# Patient Record
Sex: Female | Born: 1935 | Race: White | Hispanic: No | Marital: Married | Smoking: Former smoker
Health system: Southern US, Community
[De-identification: ages and names within clinical notes are randomized; demographics above are authoritative.]

## PROBLEM LIST (undated history)

## (undated) DIAGNOSIS — A0472 Enterocolitis due to Clostridium difficile, not specified as recurrent: Secondary | ICD-10-CM

## (undated) DIAGNOSIS — K219 Gastro-esophageal reflux disease without esophagitis: Secondary | ICD-10-CM

## (undated) DIAGNOSIS — I714 Abdominal aortic aneurysm, without rupture, unspecified: Secondary | ICD-10-CM

## (undated) DIAGNOSIS — I6522 Occlusion and stenosis of left carotid artery: Secondary | ICD-10-CM

## (undated) DIAGNOSIS — D649 Anemia, unspecified: Secondary | ICD-10-CM

## (undated) DIAGNOSIS — I739 Peripheral vascular disease, unspecified: Secondary | ICD-10-CM

## (undated) DIAGNOSIS — R011 Cardiac murmur, unspecified: Secondary | ICD-10-CM

## (undated) DIAGNOSIS — IMO0001 Reserved for inherently not codable concepts without codable children: Secondary | ICD-10-CM

## (undated) DIAGNOSIS — E230 Hypopituitarism: Secondary | ICD-10-CM

## (undated) DIAGNOSIS — E349 Endocrine disorder, unspecified: Secondary | ICD-10-CM

## (undated) DIAGNOSIS — Z5189 Encounter for other specified aftercare: Secondary | ICD-10-CM

## (undated) DIAGNOSIS — I639 Cerebral infarction, unspecified: Secondary | ICD-10-CM

## (undated) DIAGNOSIS — M1990 Unspecified osteoarthritis, unspecified site: Secondary | ICD-10-CM

## (undated) DIAGNOSIS — J189 Pneumonia, unspecified organism: Secondary | ICD-10-CM

## (undated) DIAGNOSIS — I10 Essential (primary) hypertension: Secondary | ICD-10-CM

## (undated) DIAGNOSIS — E039 Hypothyroidism, unspecified: Secondary | ICD-10-CM

## (undated) HISTORY — PX: BACK SURGERY: SHX140

## (undated) HISTORY — DX: Essential (primary) hypertension: I10

## (undated) HISTORY — PX: APPENDECTOMY: SHX54

## (undated) HISTORY — DX: Cerebral infarction, unspecified: I63.9

## (undated) HISTORY — PX: FRACTURE SURGERY: SHX138

## (undated) HISTORY — DX: Hypothyroidism, unspecified: E03.9

## (undated) HISTORY — PX: CARDIAC CATHETERIZATION: SHX172

## (undated) HISTORY — PX: JOINT REPLACEMENT: SHX530

---

## 1971-10-06 HISTORY — PX: ABDOMINAL HYSTERECTOMY: SHX81

## 1975-10-06 DIAGNOSIS — E230 Hypopituitarism: Secondary | ICD-10-CM

## 1975-10-06 HISTORY — DX: Hypopituitarism: E23.0

## 1999-09-22 ENCOUNTER — Ambulatory Visit: Admission: RE | Admit: 1999-09-22 | Discharge: 1999-09-22 | Payer: Self-pay | Admitting: Orthopedic Surgery

## 1999-11-19 ENCOUNTER — Encounter: Payer: Self-pay | Admitting: Obstetrics and Gynecology

## 1999-11-19 ENCOUNTER — Encounter: Admission: RE | Admit: 1999-11-19 | Discharge: 1999-11-19 | Payer: Self-pay | Admitting: Obstetrics and Gynecology

## 2000-02-03 HISTORY — PX: TOE FUSION: SHX1070

## 2000-02-19 ENCOUNTER — Ambulatory Visit (HOSPITAL_BASED_OUTPATIENT_CLINIC_OR_DEPARTMENT_OTHER): Admission: RE | Admit: 2000-02-19 | Discharge: 2000-02-19 | Payer: Self-pay | Admitting: Orthopedic Surgery

## 2001-01-04 ENCOUNTER — Encounter: Payer: Self-pay | Admitting: Obstetrics and Gynecology

## 2001-01-04 ENCOUNTER — Encounter: Admission: RE | Admit: 2001-01-04 | Discharge: 2001-01-04 | Payer: Self-pay | Admitting: Obstetrics and Gynecology

## 2001-01-12 ENCOUNTER — Ambulatory Visit (HOSPITAL_COMMUNITY): Admission: RE | Admit: 2001-01-12 | Discharge: 2001-01-12 | Payer: Self-pay | Admitting: Endocrinology

## 2005-07-20 ENCOUNTER — Encounter: Admission: RE | Admit: 2005-07-20 | Discharge: 2005-07-20 | Payer: Self-pay | Admitting: Endocrinology

## 2005-09-18 ENCOUNTER — Inpatient Hospital Stay (HOSPITAL_COMMUNITY): Admission: AD | Admit: 2005-09-18 | Discharge: 2005-09-21 | Payer: Self-pay | Admitting: Endocrinology

## 2005-11-12 ENCOUNTER — Ambulatory Visit (HOSPITAL_BASED_OUTPATIENT_CLINIC_OR_DEPARTMENT_OTHER): Admission: RE | Admit: 2005-11-12 | Discharge: 2005-11-12 | Payer: Self-pay | Admitting: Orthopedic Surgery

## 2005-11-12 HISTORY — PX: FOOT HARDWARE REMOVAL: SHX1661

## 2005-11-12 HISTORY — PX: MENISCUS REPAIR: SHX5179

## 2007-01-04 ENCOUNTER — Inpatient Hospital Stay (HOSPITAL_COMMUNITY): Admission: EM | Admit: 2007-01-04 | Discharge: 2007-01-08 | Payer: Self-pay | Admitting: Emergency Medicine

## 2007-01-04 HISTORY — PX: HEMIARTHROPLASTY HIP: SUR652

## 2007-07-27 ENCOUNTER — Inpatient Hospital Stay (HOSPITAL_COMMUNITY): Admission: EM | Admit: 2007-07-27 | Discharge: 2007-07-29 | Payer: Self-pay | Admitting: Emergency Medicine

## 2007-09-07 ENCOUNTER — Encounter: Admission: RE | Admit: 2007-09-07 | Discharge: 2007-10-12 | Payer: Self-pay | Admitting: Neurological Surgery

## 2008-02-03 HISTORY — PX: TOTAL KNEE ARTHROPLASTY: SHX125

## 2008-02-15 ENCOUNTER — Inpatient Hospital Stay (HOSPITAL_COMMUNITY): Admission: RE | Admit: 2008-02-15 | Discharge: 2008-02-21 | Payer: Self-pay | Admitting: Orthopedic Surgery

## 2008-03-29 ENCOUNTER — Encounter: Admission: RE | Admit: 2008-03-29 | Discharge: 2008-03-29 | Payer: Self-pay | Admitting: Neurological Surgery

## 2008-05-06 ENCOUNTER — Encounter: Admission: RE | Admit: 2008-05-06 | Discharge: 2008-05-06 | Payer: Self-pay | Admitting: Neurological Surgery

## 2008-07-08 ENCOUNTER — Encounter: Admission: RE | Admit: 2008-07-08 | Discharge: 2008-07-08 | Payer: Self-pay | Admitting: Endocrinology

## 2008-07-25 ENCOUNTER — Ambulatory Visit: Payer: Self-pay | Admitting: Vascular Surgery

## 2008-07-31 ENCOUNTER — Ambulatory Visit (HOSPITAL_COMMUNITY): Admission: RE | Admit: 2008-07-31 | Discharge: 2008-07-31 | Payer: Self-pay | Admitting: Endocrinology

## 2008-07-31 ENCOUNTER — Encounter (INDEPENDENT_AMBULATORY_CARE_PROVIDER_SITE_OTHER): Payer: Self-pay | Admitting: Endocrinology

## 2008-08-09 ENCOUNTER — Encounter: Admission: RE | Admit: 2008-08-09 | Discharge: 2008-08-09 | Payer: Self-pay | Admitting: Cardiology

## 2008-08-15 ENCOUNTER — Ambulatory Visit (HOSPITAL_COMMUNITY): Admission: RE | Admit: 2008-08-15 | Discharge: 2008-08-16 | Payer: Self-pay | Admitting: Cardiology

## 2008-08-22 ENCOUNTER — Encounter: Admission: RE | Admit: 2008-08-22 | Discharge: 2008-08-22 | Payer: Self-pay | Admitting: Radiology

## 2008-09-04 HISTORY — PX: CAROTID ENDARTERECTOMY: SUR193

## 2008-09-10 ENCOUNTER — Ambulatory Visit: Payer: Self-pay | Admitting: Vascular Surgery

## 2008-09-12 ENCOUNTER — Ambulatory Visit: Payer: Self-pay | Admitting: Vascular Surgery

## 2008-09-12 ENCOUNTER — Encounter: Payer: Self-pay | Admitting: Vascular Surgery

## 2008-09-12 ENCOUNTER — Inpatient Hospital Stay (HOSPITAL_COMMUNITY): Admission: RE | Admit: 2008-09-12 | Discharge: 2008-09-13 | Payer: Self-pay | Admitting: Vascular Surgery

## 2008-09-25 ENCOUNTER — Ambulatory Visit: Payer: Self-pay | Admitting: Vascular Surgery

## 2009-04-02 ENCOUNTER — Ambulatory Visit: Payer: Self-pay | Admitting: Vascular Surgery

## 2009-09-26 ENCOUNTER — Ambulatory Visit: Payer: Self-pay | Admitting: Vascular Surgery

## 2010-01-03 HISTORY — PX: ORIF ANKLE FRACTURE: SUR919

## 2010-01-20 ENCOUNTER — Inpatient Hospital Stay (HOSPITAL_COMMUNITY): Admission: AD | Admit: 2010-01-20 | Discharge: 2010-01-24 | Payer: Self-pay | Admitting: Orthopaedic Surgery

## 2010-05-14 ENCOUNTER — Encounter: Admission: RE | Admit: 2010-05-14 | Discharge: 2010-05-14 | Payer: Self-pay | Admitting: Endocrinology

## 2010-09-15 ENCOUNTER — Encounter
Admission: RE | Admit: 2010-09-15 | Discharge: 2010-09-15 | Payer: Self-pay | Source: Home / Self Care | Attending: Endocrinology | Admitting: Endocrinology

## 2010-09-17 ENCOUNTER — Ambulatory Visit: Payer: Self-pay | Admitting: Vascular Surgery

## 2010-11-04 ENCOUNTER — Encounter
Admission: RE | Admit: 2010-11-04 | Discharge: 2010-11-04 | Payer: Self-pay | Source: Home / Self Care | Attending: Endocrinology | Admitting: Endocrinology

## 2010-12-23 LAB — URINALYSIS, ROUTINE W REFLEX MICROSCOPIC
Hgb urine dipstick: NEGATIVE
Ketones, ur: NEGATIVE mg/dL
Nitrite: NEGATIVE
Urobilinogen, UA: 0.2 mg/dL (ref 0.0–1.0)
pH: 6.5 (ref 5.0–8.0)

## 2010-12-23 LAB — COMPREHENSIVE METABOLIC PANEL
ALT: 14 U/L (ref 0–35)
BUN: 24 mg/dL — ABNORMAL HIGH (ref 6–23)
Calcium: 8.9 mg/dL (ref 8.4–10.5)
GFR calc Af Amer: >60 mL/min (ref >60)
GFR calc non Af Amer: 54 mL/min — ABNORMAL LOW (ref >60)
Glucose, Bld: 115 mg/dL — ABNORMAL HIGH (ref 70–99)
Sodium: 133 mEq/L — ABNORMAL LOW (ref 135–145)
Total Bilirubin: 0.4 mg/dL (ref 0.3–1.2)

## 2010-12-23 LAB — CBC
HCT: 36.4 % (ref 36.0–46.0)
Hemoglobin: 12.8 g/dL (ref 12.0–15.0)
RDW: 15.2 % (ref 11.5–15.5)
WBC: 14.6 10*3/uL — ABNORMAL HIGH (ref 4.0–10.5)

## 2010-12-23 LAB — GLUCOSE, CAPILLARY
Glucose-Capillary: 119 mg/dL — ABNORMAL HIGH (ref 70–99)
Glucose-Capillary: 127 mg/dL — ABNORMAL HIGH (ref 70–99)
Glucose-Capillary: 128 mg/dL — ABNORMAL HIGH (ref 70–99)
Glucose-Capillary: 129 mg/dL — ABNORMAL HIGH (ref 70–99)
Glucose-Capillary: 139 mg/dL — ABNORMAL HIGH (ref 70–99)
Glucose-Capillary: 143 mg/dL — ABNORMAL HIGH (ref 70–99)
Glucose-Capillary: 155 mg/dL — ABNORMAL HIGH (ref 70–99)

## 2010-12-23 LAB — DIFFERENTIAL
Basophils Absolute: 0 10*3/uL (ref 0.0–0.1)
Eosinophils Absolute: 0 10*3/uL (ref 0.0–0.7)
Eosinophils Relative: 0 % (ref 0–5)
Lymphocytes Relative: 10 % — ABNORMAL LOW (ref 12–46)
Lymphs Abs: 1.4 10*3/uL (ref 0.7–4.0)
Neutrophils Relative %: 82 % — ABNORMAL HIGH (ref 43–77)

## 2011-02-17 NOTE — Cardiovascular Report (Signed)
NAME:  Katie Pace, Katie Pace              ACCOUNT NO.:  [id number]   MEDICAL RECORD NO.:  [id number]          PATIENT TYPE:  OIB   LOCATION:  4703                         FACILITY:  [facility]   PHYSICIAN:  Madaline Savage, M.D.DATE OF BIRTH:  November 19, 1935   DATE OF PROCEDURE:  08/15/2008  DATE OF DISCHARGE:                            CARDIAC CATHETERIZATION   PROCEDURES PERFORMED:  1. Combined right and left heart catheterization.  2. Left ventricular angiography.  3. Coronary angiography.  4. No coronary intervention done.   COMPLICATIONS:  None.   ENTRY SITE:  Right femoral artery and vein.   PATIENT PROFILE:  [name], who is also known as [name] is a former  Producer, television/film/video at [employer], who is a medical patient of  Dr. Corrin Parker and a cardiology patient of mine.  The patient's  family has aortic valve and vascular disease that is widespread and the  patient has a history of hypertension.   She entered the Cath Lab today for outpatient cardiac catheterization as  a bridge to presurgical clearance for an upcoming carotid artery  stenosis surgery for an 80% left carotid artery stenosis.  The case was  uncomplicated, although technically challenging.  No complications  occurred.  The patient tolerated the procedure very well.   Results are as follows:  Right atrial pressure 6/3, end-diastolic  pressure 3; right ventricular pressure 32/5, end-diastolic pressure 7;  pulmonary capillary wedge pressure of 12; A-wave 14; V-wave 10; central  aortic pressure 174/6; central aortic pressure 171/73; pulmonary artery  saturation 70%; central aorta saturation 93.  No pullback pressure  across the central aorta was reported.   ANGIOGRAPHIC RESULTS:  The patient has a markedly dominant left  circumflex coronary artery system, which gives rise to a very large  posterior descending branch and 2 posterolateral branches coming off of  a common trunk off of the midcircumflex.  No  lesions are seen in that  vessel.   Left main coronary artery is normal.   Left anterior descending coronary artery gives rise to 2 diagonal  branches and 1 major septal perforator branch.  LAD is normal.  Septal  perforator is normal.  The first diagonal branch contains a stenosis of  30% proximally.  The second diagonal branch, which bifurcates is normal  in appearance.   The right coronary artery is small and nondominant with thread-like  coronary arteries that are normal, and as previously stated, it is  nondominant.   Left ventricular angiogram shows vigorous contractility of all wall  segments.  EF is estimated at 55-60%, and there is no mitral  regurgitation.  Fick cardiac output was 6.3.  Fick cardiac index was  3.5.   FINAL IMPRESSIONS:  1. Angiographically, patent coronary arteries with only a 30% ostial      narrowing of the first diagonal branch of the left anterior      descending.  The remainder of the coronary arteries were normal.  2. Normal left ventricular systolic function, ejection fraction of 55-      60 up to 65%.  3. Normal right heart pressures.  4. Elevated blood pressure, treated with labetalol on one or two      occasions.   PLAN:  The patient will be recovered here in the Cath Lab and be a  candidate for discharge later today.  As previously stated, the purpose  of this case was to provide surgical clearance for her upcoming left  carotid artery surgery by one of the vascular surgeons at [facility].           ______________________________  Madaline Savage, M.D.     WHG/MEDQ  D:  08/15/2008  T:  08/15/2008  Job:  [id number]   cc:   [facility]

## 2011-02-17 NOTE — Op Note (Signed)
Katie Pace, Katie Pace              ACCOUNT NO.:  [id number]   MEDICAL RECORD NO.:  [id number]          PATIENT TYPE:  INP   LOCATION:  5032                         FACILITY:  [facility]   PHYSICIAN:  Nadara Mustard, MD     DATE OF BIRTH:  [birth year]   DATE OF PROCEDURE:  02/15/2008  DATE OF DISCHARGE:                               OPERATIVE REPORT   PREOP DIAGNOSIS:  Osteoarthritis, right knee.   POSTOP DIAGNOSIS:  Osteoarthritis, right knee.   PROCEDURE:  Right total knee arthroplasty with a #3 femur, #3 tibia, 10-  mm poly tray, posterior stabilized rotating platform with 35-mm patella.   SURGEON:  Nadara Mustard, MD   ASSISTANT:  Wende Neighbors, PA.   ANESTHESIA:  Femoral block plus spinal.   ESTIMATED BLOOD LOSS:  Minimal.   ANTIBIOTICS:  Clindamycin 600 mg.   IV DRAINS:  None.   COMPLICATIONS:  None.   TOURNIQUET TIME:  Approximately 16 minutes at 250 mmHg.   DISPOSITION:  To PACU in stable condition.   PROCEDURE:  The patient is a 75 year old woman with a chronic history of  osteoarthritis of her right knee.  She has pain with activities of daily  living, has failed prolonged conservative care, and presents at this  time for total knee arthroplasty.  Risks and benefits were discussed  including infection, neurovascular injury, persistent pain, DVT,  pulmonary embolus, and need for additional surgery.  The patient states  she understands and wishes to proceed at this time.   DESCRIPTION OF PROCEDURE:  The patient was brought to OR room one.  After undergoing a femoral block, she then underwent a spinal  anesthetic.  After adequate level of anesthesia was obtained, the  patient's right lower extremity was prepped using DuraPrep and draped in  a sterile field.  Katie Pace was used to cover all exposed skin.  A midline  incision was made.  This was carried down to the medial parapatellar  retinacular incision.  The patella was everted.  The canal drill was  used and the IM  guide for the femur was placed.  This was set to take 11-  mm off the femur and 11-mm was taken off the distal aspect of the femur.  The femur was sized for size 3 and the chamfer cuts were made for a size  3 femur.  Attention was then focused on the tibia.  The external  alignment guide was placed in the tibia with neutral varus-valgus and  neutral posterior slope, and this was set to take 10 mm off the tibia.  The cut was made with the external alignment guide.  The tibial surface  was sized for a size 3 and a keel punch in the trial keel was placed.  The box cut was then placed in the femur.  Box cuts were made for the  size 3 and the trial femoral component was placed and the peg holes were  drilled for the femur.  This was tried with a 10-mm poly tray.  The  patient had full extension, full flexion, and stable varus and valgus.  The trial components were removed.  The patella was then resurfaced 10-  mm was taken off the patella.  This was sized for 35 and the peg cuts  were made for the patella.  The knee was irrigated with pulsatile  lavage.  The tourniquet was inflated and 30 mL of 0.25% Marcaine plain  was injected into the popliteal fossa and care was taken not to have an  intra-articular injection.  The components were then cemented in place.  The tibial component was followed by the femoral component was cemented  in place.  The wound was irrigated with pulsatile lavage.  The tibial  tray was placed and the was kept in extension until the cement hardened.  The patella was placed and cemented and the clamp was left in place  until the cement hardened.  All loose cement was removed.  The clamp was  removed.  The knee was placed through a full range of motion.  The  patella was midline with no subluxation.  The retinacular incision was  closed using #1 Vicryl.  Subcu was closed using 2-0 Vicryl.  The skin  was closed using approximate staples.  The wound was covered with  Adaptic  orthopedic sponges, sterile Webril, and a Coban dressing.  The  patient was then taken to the PACU in stable condition.      Nadara Mustard, MD  Electronically Signed     MVD/MEDQ  D:  02/15/2008  T:  02/16/2008  Job:  [id number]

## 2011-02-17 NOTE — Procedures (Signed)
CAROTID DUPLEX EXAM   INDICATION:  Follow up left carotid endarterectomy.   HISTORY:  Diabetes:  Yes.  Cardiac:  No.  Hypertension:  Yes.  Smoking:  Quit about 50 years ago.  Previous Surgery:  CV History:  Amaurosis Fugax No, Paresthesias No, Hemiparesis No.                                       RIGHT             LEFT  Brachial systolic pressure:         170               164  Brachial Doppler waveforms:         Biphasic          Biphasic  Vertebral direction of flow:        Antegrade         Antegrade  DUPLEX VELOCITIES (cm/sec)  CCA peak systolic                   85                92  ECA peak systolic                   90                87  ICA peak systolic                   151               57  ICA end diastolic                   38                15  PLAQUE MORPHOLOGY:                  Calcified         None  PLAQUE AMOUNT:                      Moderate          None  PLAQUE LOCATION:                    ICA, ECA          None   IMPRESSION:  1. 40-59% stenosis noted in the right internal carotid artery.  2. Normal carotid duplex noted in the left internal carotid artery,      status post left carotid endarterectomy.  3. Antegrade bilateral vertebral arteries.   ___________________________________________  Quita Skye Hart [place], M.D.   MG/MEDQ  D:  04/02/2009  T:  04/02/2009  Job:  [id number]

## 2011-02-17 NOTE — Consult Note (Signed)
Katie Pace, Katie Pace              ACCOUNT NO.:  [id number]   MEDICAL RECORD NO.:  [id number]          PATIENT TYPE:  INP   LOCATION:  5032                         FACILITY:  [facility]   PHYSICIAN:  Isidor Holts, M.D.  DATE OF BIRTH:  04-26-1936   DATE OF CONSULTATION:  02/19/2008  DATE OF DISCHARGE:                                 CONSULTATION   PRIMARY MEDICAL DOCTOR:  Dr. Aldean Baker.   REQUESTING PHYSICIAN:  Dr. Dorene Grebe, orthopedic surgeon.   REASON FOR CONSULTATION:  Hyponatremia.   HISTORY OF PRESENT ILLNESS:  This is a 75 year old female admitted Feb 15, 2008, for end-stage osteoarthritis right knee and is now status post  right total knee arthroplasty on the same date, i.e. today is  postoperative day #4.  We are requested to consult for progressive  hyponatremia.   PAST MEDICAL HISTORY:  1. Sheehan's syndrome.  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  3. Hashimoto's thyroiditis.  4. Hypertension.  5. Osteoarthritis status post right hip hemiarthroplasty for right hip      fracture January 05, 2007.  6. GERD.  7. Dyslipidemia.  8. Status post multiple orthopedic procedures involving the left foot      and right knee.   MEDICATION HISTORY PREADMISSION:  1. Triamterene/hydrochlorothiazide (37.5/25) one p.o. daily.  2. Cortef 15 mg p.o. q.a.m. and 10 mg p.o. q.h.s.  3. Diltiazem CD 240 mg p.o. daily.  4. Synthroid 75 mcg p.o. daily.  5. Crestor 5 mg p.o. daily.  6. Nexium 40 mg p.o. daily.  7. Humatrope 0.5 mg injection subcutaneously q.h.s.  8. Vitamin B12 injection 1 mg IM monthly on the first day of each      month.   CURRENT MEDICATIONS:  As above, plus:  1. Tenormin 50 mg p.o. daily.  2. Ferrous sulfate 325 mg p.o. t.i.d.  3. Colace 100 mg p.o. b.i.d.  4. Os-Cal with vitamin D.  5. Coumadin per protocol.   ALLERGIES:  1. PENICILLIN.  2. LINCOCIN.  3. MACRODANTIN.  4. DILAUDID and CODEINE cause nausea.   REVIEW OF SYSTEMS:  The patient has nausea but  no vomiting.  Appetite is  low.  Denies fever.  Denies chest pain or shortness of breath.  Denies  diarrhea.   PHYSICAL EXAMINATION:  A very cheerful, pleasant and rather garrulous  lady; alert, communicative, not short of breath at rest.  VITAL SIGNS:  Temperature 97.8, pulse 58 per minute and regular,  respiratory rate 20, BP 137/55 mmHg, pulse oximeter 95% on room air.  HEENT:  No clinical pallor or jaundice.  No conjunctival injection.  Throat is clear.  Hydration status appears okay.  NECK:  Supple.  JVP not seen.  No palpable lymphadenopathy.  No palpable  goiter.  CHEST:  Clinically clear to auscultation.  No wheezes or crackles.  HEART SOUNDS:  1 and 2 heard, normal, regular, no murmurs.  ABDOMEN:  Full, soft and nontender.  No palpable organomegaly or  palpable masses.  Normal bowel sounds.  LOWER EXTREMITY EXAMINATION:  The patient has dressings over the right  knee and bilateral above-knee TED stockings.  No pitting edema.  MUSCULOSKELETAL SYSTEM:  Not formally examined.  CENTRAL NERVOUS SYSTEM:  No focal neurologic deficit on gross  examination.   INVESTIGATIONS:  Feb 18, 2008:  WBC 11.4, hemoglobin 9.1, hematocrit  26.5, platelets 214, MCV 83.4.  Electrolytes:  Sodium 125, potassium  3.8, chloride 90, CO2 28, BUN 9, creatinine 0.75, glucose 92.  Feb 19, 2008, electrolytes:  Sodium 123, potassium 3.9, chloride 88, CO2 28, BUN  10, creatinine 0.80, glucose 101.  INR is 1.6.   ASSESSMENT AND PLAN/RECOMMENDATIONS:  1. Hyponatremia.  The culprit is likely the patient's known history of      hypopituitarism, against a background of intravenous fluids, i.e.      dilutional hyponatremia.  Would recommend fluid restriction in the      range of 1000-1200 ml per day.  I notice this has already been      instituted by orthopedic surgeon.  Follow sodium levels.  We      anticipate improvement.   1. Chronic steroid treatment.  The patient of course, requires stress      steroids  to cover the stress of procedure, although she is not in      overt adrenal crisis at present.  Would recommend changing      Hydrocortisone to 50 mg IV q.12h. until clinically stable, after      which she may taper down to maintenance doses in due course.  All      other replacement therapy for her hypopituitarism continues.   1. Normocytic anemia.  Hemoglobin appears reasonable at present.   1. Dyslipidemia.  The patient is on a statin, which should be      continued.   1. Gastroesophageal reflux disease.  The patient is on proton pump      inhibitor.   COMMENTS:  We strongly recommend involving Dr. Dagoberto Ligas, et al., ie, her  primary endocrinologist, in her care.  She has had adrenal crisis in the  past.   Thank you for the consultation.  We will follow with you.      Isidor Holts, M.D.  Electronically Signed     CO/MEDQ  D:  02/19/2008  T:  02/19/2008  Job:  [id number]   cc:   Nadara Mustard, MD  G. Dorene Grebe, M.D.

## 2011-02-17 NOTE — H&P (Signed)
NAME:  Katie Pace, Katie Pace              ACCOUNT NO.:  [id number]   MEDICAL RECORD NO.:  [id number]          PATIENT TYPE:  INP   LOCATION:  6729                         FACILITY:  [facility]   PHYSICIAN:  Stefani Dama, M.D.  DATE OF BIRTH:  1936-01-15   DATE OF ADMISSION:  07/27/2007  DATE OF DISCHARGE:                              HISTORY & PHYSICAL   ADMITTING DIAGNOSES:  1. Acute lumbar radiculopathy.  2. Herniated nucleus pulposus.   HISTORY OF PRESENT ILLNESS:  Jerusalen Mateja is a 75 year old right-  handed individual who developed a sudden onset of buttock and left lower  extremity pain radiating down the thigh.  She notes that the worst pain  radiates throughout her entire left lower extremity although she admits  now that her pain is more severe in the anterior thigh.  She has had a  previous history of a herniated nucleus pulposus involving pain in the  right lower extremity.  She has had previous surgery for this process.  She notes the pain is similar but, in many senses, more severe.   PAST MEDICAL HISTORY:  Reveals that the patient has  1. 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  2. She had Hashimoto's thyroiditis.  3. Hypertension.   MEDICATION AT THE CURRENT TIME:  1. Cortef 15 mg in the morning and 10 mg in the evening.  2. Diltiazem CD 240 mg daily.  3. Atenolol 50 mg daily.  4. Triamterene/hydrochlorothiazide 37.5/25 mg daily.  5. Synthroid 0.77 mg daily.  6. Crestor 5 mg daily.  7. Humatrope 0.5 mg subcu nightly.  8. B12 1 mL monthly.   ALLERGIES:  She has an allergy to PENICILLIN, LINCOMYCIN, MACRODANTIN,  DILAUDID and CODEINE.   PRIMARY CARE PHYSICIAN:  Alfonse Alpers. Dagoberto Ligas, M.D.   Physical examination at the current time reveals that she is in severe  distress.  She rolls from side-to-side very cautiously.  She will scream  out in pain with flexion maneuvers of the left upper extremity.  Her  motor strength reveals the iliopsoas is  4/5, tibialis anterior is 4/5  and gastrocs is 5/5, iliopsoas on the right side along with the tibialis  anterior and gastrocs is all 5/5.  Sensation appears intact in the lower  extremities at the levels of the knee and the ankles both to vibration.  Straight leg raising is markedly positive on left side at 15 degrees,  negative on the right side to 45 degrees of which point she gets right  leg pain.  Patrick's maneuver is difficult to perform on the left  secondary to pain, negative on the right side.  Palpation and percussion  of her back reveals some moderate paravertebral spasm in the mid and  upper portions of the lumbar spine.  Her motor function in the upper  extremities is within limits of normal.   IMPRESSION:  The patient has evidence of left lumbar radiculopathy  involving proximal musculature and proximal irritability.  She is now  being admitted to undergo pain control with IV analgesia and PCA  control.  She will undergo an  MRI of the lumbar spine.  Based on the  findings on the MRI, further intervention, be it surgical or  conservative, will be planned.      Stefani Dama, M.D.  Electronically Signed     HJE/MEDQ  D:  07/27/2007  T:  07/28/2007  Job:  [id number]   cc:   Alfonse Alpers. Dagoberto Ligas, M.D.

## 2011-02-17 NOTE — Assessment & Plan Note (Signed)
OFFICE VISIT   Pace, [name]  DOB:  05/24/1936                                       09/25/2008  ZOXWR#:[id number]   The patient is 2 weeks status post left carotid endarterectomy for  severe left internal carotid stenosis following a small left brain  stroke, which affected her visual field on the right side.  She has done  very well since her surgery with no new neurologic changes.  She is  swallowing well, has no hoarseness, and is increasing her activity as  tolerated.  She has noted some numbness anterior to the left neck  incision, as we would expect.  She is taking aspirin (two 81 mg tablets)  plus Plavix daily and has had no new neurologic symptoms.   EXAM:  Blood pressure 140/76, heart rate is 82, respirations 14.  Left  neck incision is well healed.  Carotid pulses are 3+ with a soft bruit  on the right and no bruit on the left.  Neurologic exam is grossly  normal.   She was reassured regarding these findings and will return to see me in  6 months with a followup carotid duplex exam.  I will leave the decision  up to Dr. Dagoberto Ligas regarding whether to continue the Plavix, but I think  she should definitely remain on the aspirin as I instructed her today.   Quita Skye Hart [place], M.D.  Electronically Signed   JDL/MEDQ  D:  09/25/2008  T:  09/26/2008  Job:  [id number]   cc:   Alfonse Alpers. Dagoberto Ligas, M.D.  Richarda Overlie, M.D.

## 2011-02-17 NOTE — Procedures (Signed)
CAROTID DUPLEX EXAM   INDICATION:  Follow up carotid artery disease.   HISTORY:  Diabetes:  Yes.  Cardiac:  No.  Hypertension:  Yes.  Smoking:  No.  Previous Surgery:  Left carotid endarterectomy, December, 2009.  CV History:  History of dizziness and vertigo, currently asymptomatic.  Amaurosis Fugax No, Paresthesias No, Hemiparesis No.                                       RIGHT             LEFT  Brachial systolic pressure:         187               180  Brachial Doppler waveforms:         Normal            Normal  Vertebral direction of flow:        Antegrade         Antegrade  DUPLEX VELOCITIES (cm/sec)  CCA peak systolic                   90                56  ECA peak systolic                   83                54  ICA peak systolic                   147               52  ICA end diastolic                   33                11  PLAQUE MORPHOLOGY:                  Mixed             Mixed  PLAQUE AMOUNT:                      Moderate          Mild  PLAQUE LOCATION:                    ICA               CCA, bifurcation   IMPRESSION:  1. 40% to 59% stenosis of the right internal carotid artery.  2. No evidence of restenosis, status post carotid endarterectomy of      the left internal carotid artery.    ___________________________________________  Quita Skye Hart [place], M.D.   EM/MEDQ  D:  09/17/2010  T:  09/17/2010  Job:  [id number]

## 2011-02-17 NOTE — Procedures (Signed)
CAROTID DUPLEX EXAM   INDICATION:  Followup of carotid disease.   HISTORY:  Diabetes:  Yes.  Cardiac:  No.  Hypertension:  Yes.  Smoking:  Previous.  Previous Surgery:  Left carotid endarterectomy in December of 2009.  CV History:  Dizziness, vertigo, hx of amaurosis fugax, but not  currently.  Family history:  Amaurosis Fugax No, Paresthesias No, Hemiparesis No                                       RIGHT             LEFT  Brachial systolic pressure:         200               198  Brachial Doppler waveforms:         Triphasic         Triphasic  Vertebral direction of flow:        Antegrade         Antegrade  DUPLEX VELOCITIES (cm/sec)  CCA peak systolic                   73                74  ECA peak systolic                   91                89  ICA peak systolic                   120               68  ICA end diastolic                   32                32  PLAQUE MORPHOLOGY:                  Mixed             Mixed  PLAQUE AMOUNT:                      Moderate          Mild  PLAQUE LOCATION:                    ICA               CCA and  bifurcation   IMPRESSION:  1. 40%-59% stenosis of the right internal carotid artery.  2. No evidence of restenosis of the left internal carotid artery.  3. Vessels are also tortuous.       ___________________________________________  Quita Skye Hart [place], M.D.   CJ/MEDQ  D:  09/26/2009  T:  09/26/2009  Job:  [id number]

## 2011-02-17 NOTE — Discharge Summary (Signed)
Katie Pace, BUSSER              ACCOUNT NO.:  [id number]   MEDICAL RECORD NO.:  [id number]          PATIENT TYPE:  INP   LOCATION:  5032                         FACILITY:  [facility]   PHYSICIAN:  Nadara Mustard, MD     DATE OF BIRTH:  [birth year]   DATE OF ADMISSION:  02/15/2008  DATE OF DISCHARGE:  02/21/2008                               DISCHARGE SUMMARY   FINAL DIAGNOSIS:  Osteoarthritis of the right knee.   PROCEDURES:  Right total knee arthroplasty with a few components, size 3  femur and tibia, 10-mm poly insert with a 35-mm patella.  [name],  [facility] assisted with surgery.   DISPOSITION:  The patient was discharged to home in stable condition  with [facility] for [facility] health physical therapy.   Prescriptions for Coumadin and Darvocet.   FOLLOWUP:  Follow up in the office in 2 weeks.   HISTORY OF PRESENT ILLNESS:  The patient is a 75 year old woman with  osteoarthritis of her right knee.  She has failed conservative care, has  pain with activities of daily living and presents at this time for total  knee arthroplasty.  The patient's hospital course was essentially  unremarkable.  She underwent a right total knee arthroplasty on Feb 15, 2008.  She received clindamycin for infection prophylaxis and was  started on Coumadin for DVT prophylaxis.  The patient progressed slowly  with her physical therapy and occupational therapy.  Hemoglobin on  postoperative day #1 was 9.1, it dropped to 8.4 on postoperative day #2.  She was on iron sulfate.  Her IV and PCA were discontinued on  postoperative day #2.  The patient was consulted with [facility]  for home health physical therapy.  The patient fell on the morning of  Feb 20, 2008; however, she was moving all extremities, had no injuries.  Her sodium was improving under medical consultation.  The patient was  discharged to home in stable condition on Feb 21, 2008, with followup in  the office in 2 weeks with  [facility] for home health physical  therapy.      Nadara Mustard, MD  Electronically Signed     MVD/MEDQ  D:  03/22/2008  T:  03/22/2008  Job:  [id number]

## 2011-02-17 NOTE — H&P (Signed)
HISTORY AND PHYSICAL EXAMINATION   September 10, 2008   Re:  Katie Pace              DOB:  [birth year]   CHIEF COMPLAINT:  Severe left carotid occlusive disease with previous  left brain stroke.   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  This was  discovered by Dr. Dagoberto Ligas when she performed an eye exam on the  patient and informed the patient that she had had a stroke in the past.  She does have a visual field deficit in the right lower quadrant of both  visual fields, suggesting previous left brain stroke.  MRA and MRI were  performed and revealed small vessel disease, but no definite stroke, but  did reveal an 80-90% left internal carotid stenosis and 60-70% right  internal carotid stenosis.  The patient denies any hemiparesis, aphagia,  amaurosis fugax, diplopia, blurred vision, or syncope.   PAST MEDICAL HISTORY:  1. Hypertension.  2. Hyperlipidemia.  3. History of Sheehan's syndrome (pituitary insufficiency).  4. Degenerative joint disease.  5. Negative for COPD or coronary artery disease.  6. Previous stroke.   PREVIOUS SURGERY:  1. Right total knee replacement.  2. Lumbar laminectomy.  3. Right partial hip replacement.   FAMILY HISTORY:  History of coronary artery disease in her mother,  brother, and sister.  Possible diabetes in mother.  Stroke in  grandmother.   SOCIAL HISTORY:  She is married, has 3 children, and is retired Charity fundraiser.  She  has not used cigarettes since 1964.  Has not used alcohol.   REVIEW OF SYSTEMS:  GASTROINTESTINAL:  Includes reflux esophagitis.  EXTREMITIES:  Nonhealing ulcer in the right first toe.  History of  arthritis, joint pain, degenerative disk disease.  Chronic back  discomfort.   ALLERGIES:  Includes penicillin, Lincocin, Macrodantin, azithromycin,  Ambien, Dilaudid, and codeine.   MEDICATIONS:  1. Cortef 15 mg  a.m. and 10 mg in the p.m.  2. Cardizem 240 mg CD 1 cap in the morning.  3. Atenolol 50 mg 1 daily.  4. Crestor 5 mg 1 daily in the evening.  5. Potassium 10 mEq 1 daily.  6. Plavix 75 mg 1 daily.  7. Amitriptyline 25 mg 1 daily at bedtime.  8. Aspirin 81 mg daily.  9. Nexium 40 mg cap 1 daily.  10.Humatrope 0.5 mg daily subcutaneous.  11.Synthroid 0.075 mg daily.  12.Calcium.  13.Iron.  14.Vitamin D.   PHYSICAL EXAM:  Blood pressure 152/75, heart rate 78, respirations 12.  Generally, she is a healthy-appearing female in no apparent distress.  Alert and oriented x3.  Neck is supple.  3+ carotid pulses palpable.  There is an audible bruit on the left.  Neurologic exam is grossly  normal.  No palpable adenopathy in the neck.  Upper extremity pulses 3+  bilaterally.  Chest clear to auscultation.  Cardiovascular exam reveals  a regular rate and rhythm with no murmurs.  Abdomen is soft and  nontender with no masses.  She has 3+ femoral and popliteal pulses  bilaterally with well-perfused lower extremities.   The patient did have cardiac catheterization performed on November 11 by  Dr. Elsie Lincoln with no significant coronary artery disease noted and  estimated ejection fraction 55-60% and was cleared for carotid surgery.   IMPRESSION:  Severe left internal carotid stenosis with previous left  brain stroke.   PLAN:  To admit the patient on December 9 for an elective left carotid  endarterectomy.  The risks and benefits have been thoroughly discussed  with the patient and her husband, and she would like to proceed.   Katie Pace, M.D.  Electronically Signed   JDL/MEDQ  D:  09/10/2008  Pace:  09/11/2008  Job:  [id number]   cc:   Alfonse Alpers. Dagoberto Ligas, M.D.  Madaline Savage, M.D.

## 2011-02-17 NOTE — Op Note (Signed)
NAME:  Katie Pace, Katie Pace              ACCOUNT NO.:  [id number]   MEDICAL RECORD NO.:  [id number]          PATIENT TYPE:  INP   LOCATION:  3316                         FACILITY:  [facility]   PHYSICIAN:  Quita Skye. Hart Rochester, M.D.  DATE OF BIRTH:  22-Dec-1935   DATE OF PROCEDURE:  09/12/2008  DATE OF DISCHARGE:                               OPERATIVE REPORT   POSTOPERATIVE DIAGNOSIS:  Severe left internal carotid stenosis, status  post likely left brain cerebrovascular accident.   POSTOPERATIVE DIAGNOSIS:  Severe left internal carotid stenosis, status  post likely left brain cerebrovascular accident.   OPERATION:  Left carotid endarterectomy with Dacron patch angioplasty.   SURGEON:  Quita Skye. Hart Rochester, MD   FIRST ASSISTANT:  Jerold Coombe, PA   ANESTHESIA:  General endotracheal.   BRIEF HISTORY:  This patient suffered a stroke a few weeks ago which  resulted in some visual loss in the right lower quadrant of both visual  fields.  It was felt that she had probably suffered a small left brain  stroke, although this was not confirmed by MRI.  Carotid duplex revealed  severe left carotid occlusive disease and she is scheduled for left  carotid endarterectomy.   PROCEDURE:  The patient was taken to the operating room and placed in  the supine position at which time satisfactory general endotracheal  anesthesia was administered.  The left neck was prepped with Betadine  scrub and solution and draped in the routine sterile manner.  An  incision was made along the anterior border of the sternocleidomastoid  muscle and carried down through the subcutaneous tissue and platysma  using Bovie.  Common facial vein and external jugular veins were ligated  with 3-0 silk ties and divided exposing the common internal and external  carotid arteries.  Care was taken not to injure the vagus or hypoglossal  nerves, both of which were exposed.  There was a calcified  atherosclerotic plaque at the carotid  bifurcation extending up about 3  cm.  At that point, there was an adherent ascending pharyngeal branch  arising from the lateral aspect of the internal carotid artery.  This  was ligated with 3-0 silk ties and divided.  Exposure was necessitated  mobilizing the hypoglossal nerve because of the high nature of the  bifurcation.  A #10 shunt was prepared and the patient was heparinized.  Carotid vessels were occluded with vascular clamps.  A longitudinal  opening was made in the common carotid with 15 blade and extended up to  the internal carotid with Potts scissors to a point distal to the  disease.  Plaque was very soft in nature and easily friable, but the  distal vessel appeared normal and there was good backbleeding.  A #10  shunt was inserted without difficulty reestablishing flow in about 2  minutes.  A standard endarterectomy was then performed using the  elevator and Potts scissors with an eversion endarterectomy of the  external carotid.  The plaque was feathered off the distal internal  carotid artery nicely not requiring any tacking sutures.  Lumen was  thoroughly irrigated with heparin  saline.  All loose debris was  carefully removed and arteriotomy was closed with a patch using  continuous 6-0 Prolene.  Prior to completion of the closure, the shunt  was removed after about 30 minutes of shunt time.  Following antegrade  and retrograde flushing, closure was completed reestablishing flow  initially the external and up the internal branch.  Carotid was occluded  for less than 2 minutes for removal of shunt.  Protamine was then given  to  reverse the heparin.  Following adequate hemostasis, wound was irrigated  with saline and closed in layers with Vicryl subcuticular fashion.  Sterile dressing was applied.  The patient was taken to the recovery  room in satisfactory condition.      Quita Skye Hart Rochester, M.D.  Electronically Signed     JDL/MEDQ  D:  09/12/2008  T:  09/12/2008   Job:  [id number]   cc:   Madaline Savage, M.D.

## 2011-02-17 NOTE — Discharge Summary (Signed)
NAME:  Katie Pace, Katie Pace              ACCOUNT NO.:  [id number]   MEDICAL RECORD NO.:  [id number]          PATIENT TYPE:  INP   LOCATION:  3316                         FACILITY:  [facility]   PHYSICIAN:  Quita Skye. Hart Rochester, M.D.  DATE OF BIRTH:  Aug 07, 1936   DATE OF ADMISSION:  09/12/2008  DATE OF DISCHARGE:  09/13/2008                               DISCHARGE SUMMARY   ADMISSION DIAGNOSIS:  Severe left internal carotid artery stenosis  status post likely left branch cerebrovascular accident.   DISCHARGE DIAGNOSES:  1. Severe left internal carotid artery stenosis status post likely      left branch cerebrovascular accident, now status post left carotid      endarterectomy.  2. Hypertension.  3. Hyperlipidemia.  4. History of Sheehan syndrome (pituitary insufficiency).  5. Degenerative joint disease.  6. History of right total knee replacement.  7. Lumbar laminectomy.  8. History of right partial hip replacement.  9. Remote history of tobacco use.  10.Postoperative hypokalemia, supplemented.   ALLERGIES:  She has multiple allergies including PENICILLIN AND  DILAUDID, which cause her rash.  INDOCIN, HYDROCODONE, MACRODANTIN cause  joint problems.  She has an allergy to [facility].  She fell after taking  AMBIEN and allergy to ERYTHROMYCIN and CODEINE and sensitive to ADHESIVE  TAPE.   PROCEDURE:  On September 12, 2008, left carotid endarterectomy with Dacron  patch angioplasty by Dr. Josephina Gip.   BRIEF HISTORY:  Katie Pace is a 75 year old Caucasian female who is  believed suffered a stroke few weeks ago, which resultant in some visual  loss in her right lower quadrant with visual fields.  It was felt she  had probably suffered a small left brain stroke although this was not  confirmed by MRI.  Carotid duplex revealed severe left carotid occlusive  disease and she is referred to Dr. Josephina Gip who recommended left  carotid endarterectomy.   HOSPITAL COURSE:  Katie Pace was electively  admitted to [facility] on September 12, 2008.  She underwent the previously mentioned  procedure.  Postoperatively, she was extubated, neurologically intact  and after a short stay in the recovery unit, was transferred to step-  down unit at 1300 hours where she remained until discharge.  On the  evening of surgery, she did have some nausea and vomiting x1 which  improved overtime, however.  She also had some hypertension.  The  arterial line readings required some p.r.n. antihypertensive medication.  Her home antihypertensives were resumed on postoperative day 1.  She  also had a mild headache postoperatively which felt could be related to  perfusion versus her elevated blood pressure; however, this had also  improved by discharge.  On postoperative day 1, she was possibly able to  tolerate regular diet and ambulate without difficulty.  She denied  dysphagia and the incision was clean and intact and showed no evidence  of hematoma.  Ultimately, she was felt appropriate for discharge home.  On postoperative day 1, vitals were stable.  Discharge postoperative lab  showed white count 10.2, hemoglobin 10.1, hematocrit 29.8, platelet  count 203.  Sodium  of 137, potassium 3.2, which was supplemented, BUN of  13, creatinine 0.76, blood glucose 140.  Katie Pace was discharged home  postoperative day 1 in stable condition.   DISCHARGE MEDICATIONS:  1. Potassium citrate ER 10 mEq daily.  2. Nexium 40 mg daily.  3. Crestor 5 mg daily.  4. Cortef 10 mg 1-1/2 tablet b.i.d.  5. Diltiazem HCl CD 240 mg daily.  6. Amitriptyline HCl 25 mg daily.  7. Darvocet 100/65 mg 1-2 tablets p.o. q.6 h. p.r.n. for pain.  8. Atenolol 50 mg p.o. daily.  9. Plavix 75 mg daily.  10.Aspirin 81 mg daily.  11.Vitamin B12 1000 mcg injection monthly.  12.Humatrope 0.5 mg at bedtime.  13.Synthroid 0.075 mg daily.  14.Calcium 600 mg daily.  15.Vitamin D 2000 mg daily.  16.Iron supplement daily.   DISCHARGE  INSTRUCTIONS:  Continue a heart-healthy diet clean and  incisions gently with soap and water.  Avoid driving or heavy lifting  for the 2 weeks.  She is to see Dr. Hart Rochester in 2-3 weeks.  She is to call  sooner if she has fever greater than 100, redness, drainage from the  incision site, or any neurologic changes.      Jerold Coombe, P.A.      Quita Skye Hart Rochester, M.D.  Electronically Signed    AWZ/MEDQ  D:  09/13/2008  T:  09/14/2008  Job:  [id number]   cc:   Quita Skye. Hart Rochester, M.D.  Alfonse Alpers. Dagoberto Ligas, M.D.  Madaline Savage, M.D.

## 2011-02-17 NOTE — Assessment & Plan Note (Signed)
OFFICE VISIT   Katie Pace, Katie Pace  DOB:  March 29, 1936                                       04/02/2009  ZOXWR#:[id number]   The patient returns six months post left carotid endarterectomy done in  December of 2009 for severe left internal carotid stenosis which was  detected following a small left brain stroke.  She had some visual loss  in the right lower quadrant of both visual fields.  She had mild right  carotid occlusive disease.  She has done well since her surgery with no  new neurologic symptoms or complications.  She swallows well and  continues to have the same visual deficits she had preoperatively.  She  denies any new symptoms, however, and currently is taking aspirin 2  tablets daily (325 mg).  She does have some weakness in her lower  extremities with numbness and tingling on a chronic basis and some  discomfort in the entire legs with walking which limits her.  She denies  any chest pain or dyspnea on exertion.   PHYSICAL EXAMINATION:  On exam today blood pressure 132/73, heart rate  66, respirations 14.  Her carotid pulses are 3+, no audible bruits.  Left neck incision is healing well.  Neurologic exam is normal grossly.  Lower extremity exam reveals 3+ femoral, popliteal and posterior tibial  pulses bilaterally.   Carotid duplex exam today reveals left internal carotid artery to be  widely patent with no evidence of restenosis.  Right internal carotid  has an approximate 50% stenosis.   In general she is doing well and we will see her in 6 months with  followup carotid duplex exam unless she develops any new symptoms in the  interim.   Quita Skye Hart Rochester, M.D.  Electronically Signed   JDL/MEDQ  D:  04/02/2009  Pace:  04/03/2009  Job:  [id number]   cc:   Alfonse Alpers. Dagoberto Ligas, M.D.

## 2011-02-17 NOTE — Discharge Summary (Signed)
NAME:  Katie Pace, Katie Pace              ACCOUNT NO.:  [id number]   MEDICAL RECORD NO.:  [id number]          PATIENT TYPE:  OIB   LOCATION:  4703                         FACILITY:  [facility]   PHYSICIAN:  Madaline Savage, M.D.DATE OF BIRTH:  1936/06/01   DATE OF ADMISSION:  08/15/2008  DATE OF DISCHARGE:  08/16/2008                               DISCHARGE SUMMARY   DISCHARGE DIAGNOSES:  1. History of aortic stenosis as an outpatient by echocardiogram.  2. Strong family history of coronary disease.  3. Known peripheral vascular disease with 80% left internal carotid      artery.  4. Treated dyslipidemia.  5. Treated hypertension.  6. Urinary tract infection.   HOSPITAL COURSE:  The patient is a 75 year old female, who is a retired  Engineer, civil (consulting), who is followed by Dr. Dagoberto Ligas.  Apparently, her sister had urgent  catheterization and subsequent open heart surgery in the last 6-9  months.  She apparently has a history of a mini stroke without residual.  Echocardiogram done at another practice showed that she had aortic  stenosis with an aortic valve area of 1.54 and immediate gradient of 9  with good LV function.  She saw Dr. Elsie Lincoln in the office on August 09, 2008, for cardiac evaluation.  Dr. Elsie Lincoln did not feel that she was a  candidate for stress testing with her aortic stenosis, and she was  admitted for elective diagnostic catheterization.  This was done on  August 15, 2008, and revealed essentially normal coronaries with an EF  of 55-65%.  She had an Angio-Seal closure.  She did develop a hematoma  on her right groin, was kept overnight for observation.  We feel she can  be discharged on August 16, 2008.   LABORATORY:  A white count 8.5, hemoglobin 9.6, hematocrit 29, and  platelets 219.  Sodium 138, potassium 3.4, BUN 10, and creatinine 0.9.  Urinalysis showed trace leukocytes.  Urine culture is pending.  She did  have bacteria on her pre-admission urinalysis.  Chest x-ray showed no  active lung disease.  Telemetry shows sinus rhythm.   DISCHARGE MEDICATIONS:  1. Aspirin 162 mg a day.  2. Plavix 75 mg a day.  3. Atenolol 50 mg a day.  4. Diltiazem 240 mg a day.  5. Cortef 15 mg in the morning, 10 mg in the evening.  6. Crestor 5 mg a day.  7. Elavil 25 mg nightly.  8. Potassium 10 mEq a day.  9. Calcium daily.  10.Vitamin D daily.  11.Nexium 40 mg a day.  12.Synthroid 0.077 mg a day.  13.Cipro 250 mg b.i.d. for 2 days.   DISPOSITION:  The patient is discharged in stable condition.  She will  need a followup CBC on Monday.  She will see [place] back for routine check  in a couple of weeks.      Abelino Derrick, P.A.    ______________________________  Madaline Savage, M.D.    Lenard Lance  D:  08/16/2008  T:  08/16/2008  Job:  [id number]   cc:   Alfonse Alpers. Dagoberto Ligas, M.D.  Shary Key.  Danielle Dess, M.D.

## 2011-02-20 NOTE — Op Note (Signed)
NAME:  Katie Pace, Katie Pace              ACCOUNT NO.:  [id number]   MEDICAL RECORD NO.:  [id number]          PATIENT TYPE:  INP   LOCATION:  5025                         FACILITY:  [facility]   PHYSICIAN:  Burnard Bunting, M.D.    DATE OF BIRTH:  October 07, 1935   DATE OF PROCEDURE:  01/05/2007  DATE OF DISCHARGE:  01/08/2007                               OPERATIVE REPORT   PREOPERATIVE DIAGNOSIS:  Right hip fracture.   POSTOPERATIVE DIAGNOSIS:  Right hip fracture.   PROCEDURE:  Right hip hemiarthroplasty.   SURGEON:  Burnard Bunting, M.D.   ASSISTANT:  Vear Clock M.D.   ANESTHESIA:  General anesthesia.   ESTIMATED BLOOD LOSS:  150 mL.   DRAINS:  Hemovac x1.   PROCEDURE INDICATIONS:  [name] is a 75 year old ambulatory  female who fractured her right hip on the day of admission.  She  presents now for right hip hemiarthroplasty, after I explained the risks  and benefits.   PROCEDURE IN DETAIL:  The patient was brought to the operating room,  where general endotracheal anesthesia was induced after proper  antibiotics were administered.  The patient was placed in the lateral  decubitus position with the right peroneal nerve and axillary regional  well padded.  The right hip, leg, and foot were prepped with DuraPrep  solution and draped in a sterile manner.   The [place] was used to drape the operative field.  The patient first hip  was utilized.  Skin and subcutaneous tissue sharply divided.  Fascia  lata was encountered and then divided.  Gluteus maximus muscle was  divided in line with it fibers.  Piriformis tendon was tagged and  retracted.  Sciatic nerve was palpated and protected at all times during  the remaining portion of the case.  Charnley retractor was placed.  Hip  capsule was split and tagged for later closure.  Femoral head was  removed.  In accordance with preoperative templating, femoral neck cut  was performed, broaching was then performed in approximately 20  degrees  of anteversion.  Press-Fit Accolade-type stem was placed and reduced  with good stability achieved in full extension/external rotation.  Position of sleep 90 degrees of hip flexion, 10 degrees of adduction and  70 degrees of internal rotation.  At this time, trial prosthesis was  removed.  True prosthesis was placed with good suction-fit obtained with  femoral head size.  The patient's incision was thoroughly irrigated.  Sciatic nerve was again palpated and found to be intact.  Capsule was  closed using 0-Ethibond suture.  Hemovac drain was placed.  Fascia lata  was closed using interrupted and inverted #1 Vicryl suture, followed  by interrupted inverted 0-Vicryl suture; 2-0 Vicryl suture and staples  on the skin.  The patient was able to tolerate.  The patient's legs  lengths were approximately equal at the end of the case.  Impervious  dressing was placed.  The patient was transferred to recovery in stable  condition.      Burnard Bunting, M.D.  Electronically Signed     GSD/MEDQ  D:  02/23/2007  T:  02/24/2007  Job:  [id number]

## 2011-02-20 NOTE — Discharge Summary (Signed)
NAME:  Katie Pace, Katie Pace              ACCOUNT NO.:  [id number]   MEDICAL RECORD NO.:  [id number]          PATIENT TYPE:  INP   LOCATION:  5743                         FACILITY:  [facility]   PHYSICIAN:  Alfonse Alpers. Gegick, M.D.DATE OF BIRTH:  March 12, 1936   DATE OF ADMISSION:  09/18/2005  DATE OF DISCHARGE:                                 DISCHARGE SUMMARY   HISTORY:  This is a 75 year old woman who presents with a history of  weakness.  The patient has had a history of Sheehan's syndrome  (hypopituitarism) and she has been doing reasonably well.  She has been  taking pituitary replacement and has had instructions in the past to double  her steroid dose whenever she becomes ill.  She did do this, accordingly she  doubled her steroids, yet she began having symptoms of dysuria, back pain  and fever.  Her symptoms have persisted.  She was seen in the office and was  found to be profoundly weak.   She has a history of hypertension and also has a history of (syndrome X).  She has had cardiac evaluation in the past.  She has a history of  gastroesophageal reflux disease and a history of osteopenia.   The patient has had a family history of diabetes.   PHYSICAL EXAMINATION:  A profoundly weak woman in no distress.  She did have  mild left costovertebral angle tenderness.  She also had left flank  tenderness.  Otherwise, her abdomen was soft.   IMPRESSION ON ADMISSION:  1.  Acute pyelonephritis.  2.  Adrenal crisis.  3.  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  4.  Hypothyroidism (secondary).  5.  History of dyslipidemia.  6.  Hypertension.  7.  Gastroesophageal reflux disease.   HOSPITAL COURSE:  She was admitted to the hospital and was started on IV  fluids that had hydrocortisone in them.  She had marked improvement very  quickly.  Her urine was cultured and it was E. coli sensitive to Cipro.  She  was given Cipro and improved significantly.  Her blood pressure actually  started  to increase and her antihypertensive medications were restarted.  She had a mild hypokalemia with a potassium of 2.7, and this was replaced.  Currently it is 3.5.  Her glucose was moderately elevated related to the  increased steroids.  She has felt significantly better, is afebrile.   IMPRESSION ON DISCHARGE:  1.  Acute pyelonephritis (due to Escherichia coli).  2.  Adrenal crisis.  3.  Hypothyroidism (compensated).  4.  Hypertension.  5.  Dyslipidemia.  6.  Gastroesophageal reflux disease.   MEDICATIONS ON DISCHARGE:  1.  She will continue taking hydrocortisone 30 + 20 mg, 30 mg in the morning      and 20 mg in the evening for two days and then return to her usual dose      of 15 mg in the morning and 10 mg in the evening.  2.  Synthroid 0.075 mg one q.a.m.  3.  Cardizem CD 240 mg one q.a.m.  4.  Tenormin 50 mg one q.a.m.  5.  Enteric-coated aspirin 81 mg one q.a.m.  6.  Maxzide 25 mg one daily.  7.  Humatrope 0.5 mg daily.  8.  Crestor 5 mg q.a.m.  9.  Nexium 40 mg one q.a.m.  10. Cipro 500 mg one q.a.m.   Her diet on discharge will be as tolerated.   PLAN FOR FOLLOW-UP:  She will be seen in the office on Thursday.   CONDITION ON DISCHARGE:  Improved.           ______________________________  Alfonse Alpers Dagoberto Ligas, M.D.     CGG/MEDQ  D:  09/21/2005  T:  09/22/2005  Job:  [id number]

## 2011-02-20 NOTE — Discharge Summary (Signed)
NAME:  Katie Pace, Katie Pace              ACCOUNT NO.:  [id number]   MEDICAL RECORD NO.:  [id number]          PATIENT TYPE:  INP   LOCATION:  5025                         FACILITY:  [facility]   PHYSICIAN:  Burnard Bunting, M.D.    DATE OF BIRTH:  07-08-1936   DATE OF ADMISSION:  01/04/2007  DATE OF DISCHARGE:  01/08/2007                               DISCHARGE SUMMARY   DISCHARGE DIAGNOSIS:  Right hip fracture.   SECONDARY DIAGNOSES:  1. Hypertension.  2. Hyperthyroidism.   OPERATION/________ PROCEDURE:  Right hip hemiarthroplasty performed on  January 04, 2007.   HOSPITAL COURSE:  Miss Krausz is a 75 year old female who fell and  fractured her right hip January 04, 2007.  She was admitted to Orthopedic  Service, underwent right hip hemiarthroplasty.  At this time, she  tolerated the procedure well without any complications.  She had good  motor sensory function and dorsiflexion and plantar flexion of the foot  following surgery.  Hematocrit was 37.2 following surgery.  She was  mobilized, weightbearing as tolerated.  Was started on Coumadin for DVT  prophylaxis.  Hip precautions were reviewed.  The patient was intact on  postop day #2.  Patient had an otherwise unremarkable recovery.  She was  transferred home in good condition on January 08, 2007.   DISCHARGE MEDICATIONS:  Include:  1. Atenolol.  2. Crestor.  3. Diltiazem.  4. Hydrochlorothiazide.  5. Synthroid.  6. Cortizone.  7. Percocet for pain.  8. Coumadin for DVT prophylaxis.   PLAN:  She will follow up with me in 7 days for suture removal.  She is  discharged in good condition.      Burnard Bunting, M.D.  Electronically Signed     GSD/MEDQ  D:  02/23/2007  T:  02/23/2007  Job:  [id number]

## 2011-02-20 NOTE — Op Note (Signed)
NAME:  IBTISAM, BENGE              ACCOUNT NO.:  [id number]   MEDICAL RECORD NO.:  [id number]          PATIENT TYPE:  AMB   LOCATION:  DSC                          FACILITY:  [facility]   PHYSICIAN:  Nadara Mustard, MD     DATE OF BIRTH:  Nov 29, 1935   DATE OF PROCEDURE:  11/12/2005  DATE OF DISCHARGE:                                 OPERATIVE REPORT   PREOP DIAGNOSES:  1.  Internal derangement of right knee.  2.  Painful retained hardware of left foot.   POSTOP DIAGNOSES:  1.  Medial and lateral meniscal tear of right knee.  2.  Osteochondral defect of medial femoral condyle.  3.  Painful deep hardware of left foot.   PROCEDURE:  1.  Abrasion chondroplasty of medial femoral condyle of right knee.  2.  Partial medial and lateral meniscectomies of right knee.  3.  Removal of deep buried hardware of left foot.   SURGEON:  Nadara Mustard, M.D.   ANESTHESIA:  Knee block on the right and local 2% lidocaine 4 mL on the  left.   DRAINS:  None.   COMPLICATIONS:  None.   DISPOSITION:  To PACU in stable condition.   INDICATIONS FOR PROCEDURE:  The patient is a 75 year old woman with  osteoarthritis of her right knee. The patient has failed conservative care  she has had mechanical symptoms with catching and locking in the right knee,  unable to perform activities of daily living, and has failed conservative  care and wished to proceed with arthroscopic intervention of the right knee.  The patient also has a backing out screw which is painful in the left foot  status post midfoot fusion and she presents at this time for the above-  mentioned procedures. Risks and benefits were discussed including infection,  neurovascular injury, persistent pain, and need for additional surgery. The  patient states she understands and wishes to proceed at this time.   DESCRIPTION OF PROCEDURE:  The patient was brought to OR room 5 after  undergoing a right knee block. The patient then underwent sterile  prepping  of both the right and left lower extremities using DuraPrep and was draped  into a sterile field. Attention was first focused on the right knee. The  scope was inserted through the inferolateral portal and inferomedial working  portal was established. The patient had a significant amount of synovitis  both medially and laterally. This was debrided. She had a large  osteochondral defect of the medial femoral condyle, almost involving the  entire medial femoral condyle, as well as osteochondral changes of the  tibia. Using the ring curette and shaver abrasion chondroplasty was  performed down to subchondral bone. This was smooth and there was petechial  bleeding. The patient also had degenerative tearing of the medial meniscus  and this was also debrided with a shaver. Visualization of the notch showed  an intact ACL. Visualization of lateral joint line in the figure 4 position  showed a large degenerative tear of the lateral meniscus. This was debrided  with a shaver. The patient had intact cartilage  of the lateral femoral  condyle. Visualization of the patellofemoral joint showed grade 2 to grade 3  osteochondral changes in the patellofemoral joint. The patella was midline.  The synovitis was debrided and all loose bodies were removed from all 3  compartments and a survey was again performed of all 3 compartments. The  VAPR Mitek was used for hemostasis within all 3 compartments. The  instruments were removed. The ports were closed using 4-0 nylon. The wounds  were covered with Adaptic and  orthopedic sponges, sterile Webril, and a  Coban dressing. The focus was on the left foot. Using 4 mL  of 2% lidocaine  plain local infiltration was performed. The skin was incised. Blunt  dissection was carried down to the retained small frag screw. The screw was  removed without complications. The wound was irrigated. The skin was closed  using 4-0 nylon. The wound was covered with Adaptic,  orthopedic sponges,  sterile Webril, and a Coban dressing. The patient was then taken to PACU in  stable condition. Plan to follow up in the office in 2 weeks.      Nadara Mustard, MD  Electronically Signed     MVD/MEDQ  D:  11/12/2005  T:  11/13/2005  Job:  [id number]

## 2011-02-20 NOTE — Op Note (Signed)
NAME:  Katie Pace, Katie Pace              ACCOUNT NO.:  [id number]   MEDICAL RECORD NO.:  [id number]          PATIENT TYPE:  INP   LOCATION:  5025                         FACILITY:  [facility]   PHYSICIAN:  Burnard Bunting, M.D.    DATE OF BIRTH:  01-20-1936   DATE OF PROCEDURE:  01/05/2007  DATE OF DISCHARGE:  01/08/2007                               OPERATIVE REPORT   ADDENDUM:  It should be noted that Dr. Tresa Res was required on the hip  fracture surgery for Alicianna Zarzycki for retraction of important  neurovascular structures and the positioning of the leg during location  and dislocation.  His assistance was required and was a medical  necessity.      Burnard Bunting, M.D.  Electronically Signed     GSD/MEDQ  D:  02/24/2007  T:  02/24/2007  Job:  [id number]

## 2011-02-20 NOTE — Op Note (Signed)
[place]  Patient:    Katie Pace, Katie Pace                     MRN: [id number] Proc. Date: 02/19/00 Adm. Date:  [id number] Disc. Date: [id number] Attending:  Aldean Baker V                           Operative Report  PREOPERATIVE DIAGNOSIS:  Collapsed medial column, left foot with collapse of the medial cuneiform and first metatarsal joint.  POSTOPERATIVE DIAGNOSIS:  Collapsed medial column, left foot with collapse of the medial cuneiform and first metatarsal joint.  PROCEDURE:  Fusion and alignment of the first metatarsal medial cuneiform joint.  SURGEON:  Nadara Mustard, M.D.  ANESTHESIA:  Ankle block.  ESTIMATED BLOOD LOSS:  Minimal.  ANTIBIOTICS:  One gram of Kefzol.  DRAINS:  None.  COMPLICATIONS:  None.  DISPOSITION:  To PACU in stable condition.  INDICATIONS FOR PROCEDURE:  The patient is a 75 year old woman with progressive pain on the medial column on her left foot.  The patient has been unable to ambulate without pain.  Has tried conservative care with anti-inflammatories and activity modifications, and presents at this time for alignment and fusion.  The risks and benefits were discussed including further collapse, persistent pain, neurovascular injury, infection, failure of the hardware.  The patient states he understands and wish to proceed at this time.  DESCRIPTION OF PROCEDURE:  The patient was brought to OR #5 after undergoing an ankle block.  After an adequate level of anesthesia was obtained, the patients left lower extremity was prepped using DuraPrep, and draped into the sterile field, and Ioban was used to cover all exposed skin.  A medial incision was made longitudinally, centered over the medial cuneiform and first metatarsal.  This was carried down to bone.  The posterior tibial tendon had degenerative changes, as well as the anterior tibial tendon, and both tendons were protected.  The anterior tibial tendon was  reinforced after the procedure.  The joint of the medial cuneiform and first metatarsal was taken down.  Osteophytes were removed, dorsal and plantar.  Articular cartilage was removed from both aspects of the bone.  The bone was then realigned and fused using 3.5 cortical screws with a gliding hole on the proximal aspect.  Three screws were fused with two screws going from distal to proximal, and one screw going proximal to distal, as well as stabilization of the Lisfranc joint.  The wound was irrigated.  The anterior tibial tendon was reinforced with suture.  The deep layer was closed with a 2-0 Vicryl.  The skin was closed using a far-near and near-far stitch.  The wound was covered with Adaptic, orthopedic sponges, sterile Webril.  A U-posterior splint was applied.  Coban was wrapped loosely.  The patient was then taken to PACU in stable condition.  Plan for 23-hour observation with IV antibiotics.  Followup in the office in two weeks.  Rest, ice, elevation, and nonweightbearing x 6 weeks. DD:  02/19/00 TD:  02/24/00 Job: [id number]

## 2011-02-20 NOTE — H&P (Signed)
NAME:  Katie Pace, Katie Pace              ACCOUNT NO.:  [id number]   MEDICAL RECORD NO.:  [id number]          PATIENT TYPE:  INP   LOCATION:  5743                         FACILITY:  [facility]   PHYSICIAN:  Alfonse Alpers. Gegick, M.D.DATE OF BIRTH:  [birth year]   DATE OF ADMISSION:  09/18/2005  DATE OF DISCHARGE:                                HISTORY & PHYSICAL   CHIEF COMPLAINT:  This is a 75 year old woman who presents with a history of  weakness.   HISTORY OF PRESENT ILLNESS:  Her symptoms began approximately three to four  days prior to this admission.  At that time, she had symptoms of weakness  and some abdominal pain.  Her symptoms have increased during the period of  time.  Last night she had two episodes of profound weakness where she fell  to the floor.  She denies any history of previous dysuria.  In the office,  she was evaluated, and she had a urinalysis which showed 45-50 WBCs in the  urine.  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  She has been taking steroids and has been doing  reasonably well.  She had previously increased and doubled her steroid dose  appropriately but she presents with severe profound weakness yet.  She is  receiving thyroid replacement and also growth hormone replacement.  She has  a history of having had hypertension.  She is currently receiving medication  for this.  She also has a history of osteopenia and a history of  gastroesophageal reflux disease for which she is taking Nexium with good  results.  Most recently she has had a history of a Baker cyst which has  ruptured and she is improved with this.   PAST MEDICAL HISTORY:  She was hospitalized extensively during her  diagnostic evaluation for her Sheehan syndrome which was done in 1977.   MEDICATIONS:  Prior to this admission:  1.  Hydrocortisone 15 mg in the morning and 10 mg in the evening.  2.  Synthroid 0.075 mg one every day.  3.  Cardizem CD 240 mg  one every day.  4.  Tenormin 50 mg one every day.  5.  Vitamin B-12 every month.  6.  Enteric coated aspirin 81 mg every day.  7.  Zestril 40 mg (this is on hold).  8.  Maxzide 25, one every day.  9.  __________  0.5 mg every day.  10. Crestor 5 mg every day.  11. Nexium 40 mg every day.   PERSONAL HISTORY:  She does not smoke or drink excessive amounts of alcohol.   She does have a history of allergy to the following medications:  1.  PENICILLIN.  2.  LINCOCIN.  3.  MACRODANTIN.  4.  ERYTHROMYCIN.  5.  DECLOMYCIN.  6.  DILAUDID.  7.  DEMEROL.  8.  DARVON.  9.  CODEINE.  10. ZOCOR.  11. CLINDAMYCIN.   REVIEW OF SYSTEMS:  Her weight has previously been steady until recently.  CARDIOVASCULAR/RESPIRATORY:  She has had a history of __________  syndrome  X) with chest  pain.  She has had cardiac catheterizations which have been  negative.  GI:  No symptoms.   PHYSICAL EXAMINATION:  GENERAL:  This is a well developed woman who appears  profoundly weak.  VITAL SIGNS:  Her blood pressure lying down was 100/70, sitting up it  dropped to 70/50 and she felt profoundly weak.  NECK:  Supple.  LUNGS:  Clear.  CARDIOVASCULAR:  Rhythm is regular.  BREASTS:  No masses are present.  BACK:  Reveals moderate left CVA tenderness present.  ABDOMEN:  Soft.  Tenderness is present in the left flank area.  EXTREMITIES:  Show no pedal edema.  NEUROMUSCULAR:  Sensory normal.   IMPRESSION:  1.  Acute pyelonephritis.  2.  Adrenal crisis.  3.  Hypothyroidism.  4.  History of hypertension.  5.  History of pernicious anemia.  6.  Dyslipidemia.  7.  Gastroesophageal reflux disease.           ______________________________  Alfonse Alpers. Dagoberto Ligas, M.D.     CGG/MEDQ  D:  09/18/2005  T:  09/19/2005  Job:  [id number]

## 2011-07-01 LAB — BASIC METABOLIC PANEL
BUN: 10
BUN: 9
CO2: 27
CO2: 28
CO2: 28
CO2: 30
Calcium: 8.1 — ABNORMAL LOW
Calcium: 8.7
Chloride: 88 — ABNORMAL LOW
Chloride: 88 — ABNORMAL LOW
Chloride: 90 — ABNORMAL LOW
Chloride: 92 — ABNORMAL LOW
Creatinine, Ser: 0.75
Creatinine, Ser: 0.8
GFR calc Af Amer: >60
GFR calc non Af Amer: >60
Glucose, Bld: 101 — ABNORMAL HIGH
Glucose, Bld: 126 — ABNORMAL HIGH
Glucose, Bld: 139 — ABNORMAL HIGH
Glucose, Bld: 92
Potassium: 3 — ABNORMAL LOW
Potassium: 3.2 — ABNORMAL LOW
Potassium: 3.9
Potassium: 3.9
Sodium: 126 — ABNORMAL LOW
Sodium: 128 — ABNORMAL LOW
Sodium: 132 — ABNORMAL LOW

## 2011-07-01 LAB — CBC
HCT: 25 — ABNORMAL LOW
Hemoglobin: 8.4 — ABNORMAL LOW
Hemoglobin: 9.1 — ABNORMAL LOW
MCHC: 34.4
MCV: 82.9
MCV: 83.4
Platelets: 204
RBC: 3.18 — ABNORMAL LOW
RDW: 15.5
RDW: 15.8 — ABNORMAL HIGH

## 2011-07-01 LAB — PROTIME-INR
INR: 1.5
INR: 2.6 — ABNORMAL HIGH
Prothrombin Time: 18.3 — ABNORMAL HIGH
Prothrombin Time: 19.5 — ABNORMAL HIGH

## 2011-07-08 LAB — URINALYSIS, ROUTINE W REFLEX MICROSCOPIC
Ketones, ur: NEGATIVE
Nitrite: NEGATIVE
Protein, ur: NEGATIVE
pH: 7.5

## 2011-07-08 LAB — BASIC METABOLIC PANEL
BUN: 10
Chloride: 101
Glucose, Bld: 101 — ABNORMAL HIGH
Potassium: 3.4 — ABNORMAL LOW

## 2011-07-08 LAB — POCT I-STAT 3, VENOUS BLOOD GAS (G3P V)
Bicarbonate: 27.5 — ABNORMAL HIGH
pCO2, Ven: 42.9 — ABNORMAL LOW
pH, Ven: 7.416 — ABNORMAL HIGH
pO2, Ven: 36

## 2011-07-08 LAB — CBC
HCT: 29 — ABNORMAL LOW
MCV: 85.2
Platelets: 219
RDW: 15.7 — ABNORMAL HIGH
WBC: 8.5

## 2011-07-08 LAB — URINE CULTURE: Colony Count: 60000

## 2011-07-08 LAB — POCT I-STAT 3, ART BLOOD GAS (G3+): pH, Arterial: 7.457 — ABNORMAL HIGH

## 2011-07-10 LAB — CBC
HCT: 34.8 % — ABNORMAL LOW (ref 36.0–46.0)
MCHC: 33.7 g/dL (ref 30.0–36.0)
Platelets: 203 10*3/uL (ref 150–400)
Platelets: 253 10*3/uL (ref 150–400)
RDW: 16.2 % — ABNORMAL HIGH (ref 11.5–15.5)
RDW: 16.8 % — ABNORMAL HIGH (ref 11.5–15.5)

## 2011-07-10 LAB — COMPREHENSIVE METABOLIC PANEL
Albumin: 3.8 g/dL (ref 3.5–5.2)
Alkaline Phosphatase: 92 U/L (ref 39–117)
BUN: 15 mg/dL (ref 6–23)
Calcium: 9.2 mg/dL (ref 8.4–10.5)
Potassium: 4.1 mEq/L (ref 3.5–5.1)
Total Protein: 6.6 g/dL (ref 6.0–8.3)

## 2011-07-10 LAB — URINALYSIS, ROUTINE W REFLEX MICROSCOPIC
Hgb urine dipstick: NEGATIVE
Nitrite: NEGATIVE
Specific Gravity, Urine: 1.009 (ref 1.005–1.030)
Urobilinogen, UA: 0.2 mg/dL (ref 0.0–1.0)

## 2011-07-10 LAB — PROTIME-INR: INR: 1 (ref 0.00–1.49)

## 2011-07-10 LAB — BASIC METABOLIC PANEL
BUN: 13 mg/dL (ref 6–23)
CO2: 27 mEq/L (ref 19–32)
Calcium: 8.4 mg/dL (ref 8.4–10.5)
GFR calc non Af Amer: >60 mL/min (ref >60)
Glucose, Bld: 140 mg/dL — ABNORMAL HIGH (ref 70–99)

## 2011-07-10 LAB — TYPE AND SCREEN
ABO/RH(D): A POS
Antibody Screen: NEGATIVE

## 2011-07-10 LAB — APTT: aPTT: 26 seconds (ref 24–37)

## 2011-07-15 LAB — COMPREHENSIVE METABOLIC PANEL
ALT: 14
Albumin: 3.5
Alkaline Phosphatase: 94
BUN: 21
Calcium: 8.9
Potassium: 3.3 — ABNORMAL LOW
Sodium: 124 — ABNORMAL LOW
Total Protein: 6.9

## 2011-10-29 DIAGNOSIS — R112 Nausea with vomiting, unspecified: Secondary | ICD-10-CM | POA: Diagnosis not present

## 2011-11-02 ENCOUNTER — Other Ambulatory Visit: Payer: Self-pay | Admitting: Gastroenterology

## 2011-11-02 DIAGNOSIS — R112 Nausea with vomiting, unspecified: Secondary | ICD-10-CM

## 2011-11-03 ENCOUNTER — Ambulatory Visit
Admission: RE | Admit: 2011-11-03 | Discharge: 2011-11-03 | Disposition: A | Discharge status: Home / Self Care | Source: Ambulatory Visit | Attending: Gastroenterology | Admitting: Gastroenterology

## 2011-11-03 DIAGNOSIS — R109 Unspecified abdominal pain: Secondary | ICD-10-CM | POA: Diagnosis not present

## 2011-11-03 DIAGNOSIS — R112 Nausea with vomiting, unspecified: Secondary | ICD-10-CM | POA: Diagnosis not present

## 2011-11-03 DIAGNOSIS — I714 Abdominal aortic aneurysm, without rupture: Secondary | ICD-10-CM | POA: Diagnosis not present

## 2011-11-05 ENCOUNTER — Other Ambulatory Visit: Payer: Self-pay | Admitting: Gastroenterology

## 2011-11-06 ENCOUNTER — Encounter: Payer: Self-pay | Admitting: Vascular Surgery

## 2011-11-06 DIAGNOSIS — K2970 Gastritis, unspecified, without bleeding: Secondary | ICD-10-CM | POA: Diagnosis not present

## 2011-11-06 DIAGNOSIS — R1013 Epigastric pain: Secondary | ICD-10-CM | POA: Diagnosis not present

## 2011-11-06 DIAGNOSIS — K2990 Gastroduodenitis, unspecified, without bleeding: Secondary | ICD-10-CM | POA: Diagnosis not present

## 2011-11-06 DIAGNOSIS — K319 Disease of stomach and duodenum, unspecified: Secondary | ICD-10-CM | POA: Diagnosis not present

## 2011-11-06 DIAGNOSIS — K449 Diaphragmatic hernia without obstruction or gangrene: Secondary | ICD-10-CM | POA: Diagnosis not present

## 2011-11-06 DIAGNOSIS — R112 Nausea with vomiting, unspecified: Secondary | ICD-10-CM | POA: Diagnosis not present

## 2011-11-09 ENCOUNTER — Ambulatory Visit (INDEPENDENT_AMBULATORY_CARE_PROVIDER_SITE_OTHER): Payer: Medicare Other | Admitting: Vascular Surgery

## 2011-11-09 ENCOUNTER — Encounter: Payer: Self-pay | Admitting: Vascular Surgery

## 2011-11-09 VITALS — BP 120/53 | HR 69 | Resp 20 | Ht 64.0 in | Wt 153.0 lb

## 2011-11-09 DIAGNOSIS — Z01818 Encounter for other preprocedural examination: Secondary | ICD-10-CM | POA: Diagnosis not present

## 2011-11-09 DIAGNOSIS — I714 Abdominal aortic aneurysm, without rupture, unspecified: Secondary | ICD-10-CM

## 2011-11-09 NOTE — Progress Notes (Signed)
Addended by: Sharee Pimple on: 11/09/2011 10:58 AM   Modules accepted: Orders

## 2011-11-09 NOTE — Progress Notes (Signed)
Subjective:     Patient ID: Katie Pace, female   DOB: 19-Jun-1936, 76 y.o.   MRN: [id number]  HPI 76 year old female is known to me having previously had a left carotid endarterectomy in 2009. She had suffered a left brain stroke prior to that time and had some lower visual field cuts. She now has been found to have a 5.1 cm abdominal aortic aneurysm about ultrasound performed last week. She has been experiencing nausea with very little vomiting over the past month but denies abdominal pain or flank pain. She had a GI workup by Dr. Evette Cristal which was negative including upper endoscopy and an upper GI series. Today she is evaluated for her newly discovered aortic aneurysm  Past Medical History  Diagnosis Date  . Hypertension   . Hypothyroidism   . Diabetes mellitus   . Stroke     Ischemic    History  Substance Use Topics  . Smoking status: Former Smoker    Types: Cigarettes    Quit date: 10/06/1963  . Smokeless tobacco: Never Used  . Alcohol Use: No    Family History  Problem Relation Age of Onset  . Heart disease Mother   . Peripheral vascular disease Mother   . Alcohol abuse Father   . Heart disease Sister   . Heart disease Brother     Allergies  Allergen Reactions  . Ambien Other (See Comments)    Stroke  . Codeine Nausea Only  . Dilaudid (Hydromorphone Hcl) Nausea Only  . Lincocin Other (See Comments)    Big bumps bilateral legs  . Macrodantin Hives  . Azithromycin Rash  . Penicillins Rash    Current outpatient prescriptions:alendronate (FOSAMAX) 70 MG tablet, Take 70 mg by mouth every 7 (seven) days. Take with a full glass of water on an empty stomach., Disp: , Rfl: ;  amitriptyline (ELAVIL) 25 MG tablet, Take 25 mg by mouth at bedtime., Disp: , Rfl: ;  aspirin 81 MG tablet, Take 160 mg by mouth daily., Disp: , Rfl: ;  atenolol (TENORMIN) 50 MG tablet, Take 50 mg by mouth daily., Disp: , Rfl:  cholecalciferol (VITAMIN D) 1000 UNITS tablet, Take 3,000 Units by mouth  daily., Disp: , Rfl: ;  clotrimazole-betamethasone (LOTRISONE) cream, Apply topically 2 (two) times daily., Disp: , Rfl: ;  Cyanocobalamin 1000 MCG/15ML LIQD, Take by mouth., Disp: , Rfl: ;  diltiazem (CARDIZEM CD) 240 MG 24 hr capsule, Take 240 mg by mouth daily., Disp: , Rfl: ;  diltiazem (TIAZAC) 240 MG 24 hr capsule, Take 240 mg by mouth daily., Disp: , Rfl:  dipyridamole-aspirin (AGGRENOX) 25-200 MG per 12 hr capsule, Take 1 capsule by mouth 2 (two) times daily., Disp: , Rfl: ;  esomeprazole (NEXIUM) 40 MG capsule, Take 40 mg by mouth daily before breakfast., Disp: , Rfl: ;  glucose monitoring kit (FREESTYLE) monitoring kit, 1 each by Does not apply route as needed., Disp: , Rfl: ;  hydrocortisone (CORTEF) 20 MG tablet, Take 20 mg by mouth daily., Disp: , Rfl:  levothyroxine (SYNTHROID, LEVOTHROID) 75 MCG tablet, Take 75 mcg by mouth daily., Disp: , Rfl: ;  ondansetron (ZOFRAN) 4 MG/5ML solution, Take by mouth once., Disp: , Rfl: ;  pioglitazone (ACTOS) 45 MG tablet, Take 45 mg by mouth daily., Disp: , Rfl: ;  potassium citrate (UROCIT-K) 10 MEQ (1080 MG) SR tablet, Take 10 mEq by mouth 3 (three) times daily with meals., Disp: , Rfl:  rosuvastatin (CRESTOR) 5 MG tablet, Take 5 mg by  mouth daily., Disp: , Rfl: ;  Somatropin (HUMATROPE) 5 MG SOLR, Inject as directed., Disp: , Rfl: ;  traMADol (ULTRAM) 50 MG tablet, Take 50 mg by mouth every 6 (six) hours as needed., Disp: , Rfl: ;  triamterene-hydrochlorothiazide (MAXZIDE-25) 37.5-25 MG per tablet, Take 1 tablet by mouth daily., Disp: , Rfl:   BP 120/53  Pulse 69  Resp 20  Ht 5\' 4"  (1.626 m)  Wt 153 lb (69.4 kg)  BMI 26.26 kg/m2  Body mass index is 26.26 kg/(m^2).        Review of Systems he denies chest pain, dyspnea on exertion, PND, orthopnea. She does have unsteady gait from previous stroke. She complains of diabetic peripheral neuropathy with decreased sensation in feet. Has poor vision in the lower visual field from previous stroke.  Other systems are negative for complete review of systems    Objective:   Physical Exam blood pressure 120/53 heart rate 69 respirations 20 Gen. elderly female in no apparent stress alert and oriented x3 HEENT normal for age Lungs no rhonchi or wheezing Cardiovascular regular rhythm no murmurs carotid pulses 3+ no audible bruits Abdomen obese 5 cm pulsatile mass in the midepigastrium. It is nontender. Neurologic decreased sensation in feet Skin free of rashes Musculoskeletal 3 major deformities Lower extremities 3+ femoral 2+ popliteal and 2+ dorsalis pedis pulses palpable bilaterally.  I reviewed her ultrasound study today which reveals a 5.1 cm infrarenal abdominal aortic aneurysm.    Assessment:     5.1 cm abdominal aortic aneurysm-recent symptoms of nausea not related to aneurysm No history of coronary artery disease    Plan:     #1 CT angiogram to see if patient can it for aortic stent grafting #2 Cardiolite to look for silent coronary artery disease Return to see me in one week

## 2011-11-10 ENCOUNTER — Ambulatory Visit: Admitting: Vascular Surgery

## 2011-11-11 ENCOUNTER — Ambulatory Visit
Admission: RE | Admit: 2011-11-11 | Discharge: 2011-11-11 | Disposition: A | Discharge status: Home / Self Care | Payer: Medicare Other | Source: Ambulatory Visit | Attending: Vascular Surgery | Admitting: Vascular Surgery

## 2011-11-11 ENCOUNTER — Ambulatory Visit: Admission: RE | Admit: 2011-11-11 | Payer: Medicare Other | Source: Ambulatory Visit

## 2011-11-11 ENCOUNTER — Other Ambulatory Visit: Payer: Self-pay | Admitting: Surgery

## 2011-11-11 ENCOUNTER — Other Ambulatory Visit: Payer: Self-pay | Admitting: Vascular Surgery

## 2011-11-11 DIAGNOSIS — I714 Abdominal aortic aneurysm, without rupture: Secondary | ICD-10-CM

## 2011-11-11 DIAGNOSIS — Z01818 Encounter for other preprocedural examination: Secondary | ICD-10-CM

## 2011-11-11 LAB — BUN: BUN: 29 mg/dL — ABNORMAL HIGH (ref 6–23)

## 2011-11-11 LAB — CREATININE, SERUM: Creat: 2.17 mg/dL — ABNORMAL HIGH (ref 0.50–1.10)

## 2011-11-12 DIAGNOSIS — I719 Aortic aneurysm of unspecified site, without rupture: Secondary | ICD-10-CM | POA: Diagnosis not present

## 2011-11-12 DIAGNOSIS — E1129 Type 2 diabetes mellitus with other diabetic kidney complication: Secondary | ICD-10-CM | POA: Diagnosis not present

## 2011-11-12 DIAGNOSIS — Z01810 Encounter for preprocedural cardiovascular examination: Secondary | ICD-10-CM | POA: Diagnosis not present

## 2011-11-12 DIAGNOSIS — N182 Chronic kidney disease, stage 2 (mild): Secondary | ICD-10-CM | POA: Diagnosis not present

## 2011-11-12 NOTE — Progress Notes (Signed)
Feb 6,2013 Received  Mrs. Cleola Perryman  at [facility] ,approximately 15:40 pm Labs were performed with results repeated twice due to elevated creatinine  #1 Bun 27      #2 Bun 27      Creat. 2.2       Creat. 2.1        GFR 22 Spoke with Okey Regal at [facility]at Dr. Candie Chroman order, Patient needs oral and IV hydration and repeat labs on Feb. 8,2013 to see if creatinine levels Have dropped in order to give IV contrast for CT angiogram study.  Depending on creatinine levels and using radiology protocols , scan could possible be performed with IV contrast. Per Dr. Candie Chroman verbal order,patient Dena Wissmann , received 250 ml  0.9% sodium chloride  Through 20g Angiocath placed in left antecubital vein at 16:18 pm Normal saline IV began at 17:02pm and completed at 17:27pm. Angiocath then  removed at 17:40pm. Patient instructed to drink plenty of fluids until appointment time on Feb. 8 ,2013 Patient will return to [facility] on Feb. 8, 13:30 pm for repeat labs and for CT scan.  Sheilah Mins, RT.R,MR

## 2011-11-13 ENCOUNTER — Inpatient Hospital Stay
Admission: RE | Admit: 2011-11-13 | Discharge: 2011-11-13 | Payer: Medicare Other | Source: Ambulatory Visit | Attending: Vascular Surgery | Admitting: Vascular Surgery

## 2011-11-13 ENCOUNTER — Other Ambulatory Visit: Payer: Self-pay | Admitting: Vascular Surgery

## 2011-11-13 ENCOUNTER — Ambulatory Visit
Admission: RE | Admit: 2011-11-13 | Discharge: 2011-11-13 | Disposition: A | Discharge status: Home / Self Care | Payer: Medicare Other | Source: Ambulatory Visit

## 2011-11-13 DIAGNOSIS — I714 Abdominal aortic aneurysm, without rupture: Secondary | ICD-10-CM

## 2011-11-13 DIAGNOSIS — Z01818 Encounter for other preprocedural examination: Secondary | ICD-10-CM

## 2011-11-16 ENCOUNTER — Encounter: Payer: Self-pay | Admitting: Vascular Surgery

## 2011-11-17 ENCOUNTER — Ambulatory Visit (INDEPENDENT_AMBULATORY_CARE_PROVIDER_SITE_OTHER): Payer: Medicare Other | Admitting: Vascular Surgery

## 2011-11-17 ENCOUNTER — Encounter: Payer: Self-pay | Admitting: Vascular Surgery

## 2011-11-17 VITALS — BP 115/70 | HR 60 | Resp 20 | Ht 64.5 in | Wt 150.0 lb

## 2011-11-17 DIAGNOSIS — I714 Abdominal aortic aneurysm, without rupture: Secondary | ICD-10-CM | POA: Diagnosis not present

## 2011-11-17 NOTE — Progress Notes (Signed)
Subjective:     Patient ID: Katie Pace, female   DOB: 03/25/1936, 75 y.o.   MRN: [id number]  HPI this 75-year-old female returns today for further discussion regarding her abdominal aortic aneurysm. Her nausea has been managed by anti-emetic medications. She is having no, or back pain. She had a CT scan performed last week which I have reviewed by computer. It was done without contrast because her creatinine is 1.9 and did not improve much with hydration she is not a candidate for aortic stent grafting because of a very short angulated neck. She also has significant calcification in her iliac arteries.  Past Medical History  Diagnosis Date  . Hypertension   . Hypothyroidism   . Diabetes mellitus   . Stroke     Ischemic    History  Substance Use Topics  . Smoking status: Former Smoker -- 4 years    Types: Cigarettes    Quit date: 10/06/1963  . Smokeless tobacco: Never Used  . Alcohol Use: No    Family History  Problem Relation Age of Onset  . Heart disease Mother   . Peripheral vascular disease Mother   . Alcohol abuse Father   . Heart disease Sister   . Heart disease Brother     Allergies  Allergen Reactions  . Ambien Other (See Comments)    Stroke  . Codeine Nausea Only  . Dilaudid (Hydromorphone Hcl) Nausea Only  . Lincocin Other (See Comments)    Big bumps bilateral legs  . Macrodantin Hives  . Azithromycin Rash  . Penicillins Rash    Current outpatient prescriptions:alendronate (FOSAMAX) 70 MG tablet, Take 70 mg by mouth every 7 (seven) days. Take with a full glass of water on an empty stomach., Disp: , Rfl: ;  amitriptyline (ELAVIL) 25 MG tablet, Take 25 mg by mouth at bedtime., Disp: , Rfl: ;  aspirin 81 MG tablet, Take 160 mg by mouth daily., Disp: , Rfl: ;  atenolol (TENORMIN) 50 MG tablet, Take 50 mg by mouth daily., Disp: , Rfl:  cholecalciferol (VITAMIN D) 1000 UNITS tablet, Take 3,000 Units by mouth daily., Disp: , Rfl: ;  clotrimazole-betamethasone  (LOTRISONE) cream, Apply topically 2 (two) times daily., Disp: , Rfl: ;  Cyanocobalamin 1000 MCG/15ML LIQD, Take by mouth., Disp: , Rfl: ;  diltiazem (CARDIZEM CD) 240 MG 24 hr capsule, Take 240 mg by mouth daily., Disp: , Rfl: ;  diltiazem (TIAZAC) 240 MG 24 hr capsule, Take 240 mg by mouth daily., Disp: , Rfl:  dipyridamole-aspirin (AGGRENOX) 25-200 MG per 12 hr capsule, Take 1 capsule by mouth 2 (two) times daily., Disp: , Rfl: ;  esomeprazole (NEXIUM) 40 MG capsule, Take 40 mg by mouth daily before breakfast., Disp: , Rfl: ;  glucose monitoring kit (FREESTYLE) monitoring kit, 1 each by Does not apply route as needed., Disp: , Rfl: ;  hydrocortisone (CORTEF) 20 MG tablet, Take 20 mg by mouth daily., Disp: , Rfl:  levothyroxine (SYNTHROID, LEVOTHROID) 75 MCG tablet, Take 75 mcg by mouth daily., Disp: , Rfl: ;  ondansetron (ZOFRAN) 4 MG/5ML solution, Take by mouth once., Disp: , Rfl: ;  pioglitazone (ACTOS) 45 MG tablet, Take 45 mg by mouth daily., Disp: , Rfl: ;  potassium citrate (UROCIT-K) 10 MEQ (1080 MG) SR tablet, Take 10 mEq by mouth 3 (three) times daily with meals., Disp: , Rfl:  rosuvastatin (CRESTOR) 5 MG tablet, Take 5 mg by mouth daily., Disp: , Rfl: ;  Somatropin (HUMATROPE) 5 MG SOLR, Inject   as directed., Disp: , Rfl: ;  traMADol (ULTRAM) 50 MG tablet, Take 50 mg by mouth every 6 (six) hours as needed., Disp: , Rfl: ;  triamterene-hydrochlorothiazide (MAXZIDE-25) 37.5-25 MG per tablet, Take 1 tablet by mouth daily., Disp: , Rfl:   BP 115/70  Pulse 60  Resp 20  Ht 5' 4.5" (1.638 m)  Wt 150 lb (68.04 kg)  BMI 25.35 kg/m2  Body mass index is 25.35 kg/(m^2).        Review of Systems denies chest pain, dyspnea on exertion, PND, orthopnea, hemoptysis, claudication, lateralizing weakness, amaurosis fugax, diplopia, blurred vision, syncope she does have chronic mechanical back pain. She has had some nausea recently which is improving. Denies abdominal pain. All other systems are  negative    Objective:   Physical Exam blood pressure 115/70 heart rate 60 respirations 20 Gen.-alert and oriented x3 in no apparent distress HEENT normal for age Lungs no rhonchi or wheezing Cardiovascular regular rhythm no murmurs carotid pulses 3+ palpable no bruits audible Abdomen soft nontender 5-6 cm pulsatile mass in the midepigastrium-nontender Musculoskeletal free of  major deformities Skin clear -no rashes Neurologic normal Lower extremities 3+ femoral and popliteal pulses palpable bilaterally. Distal pulses difficult to palpate. Both feet are well-perfused    Assessment:     Infrarenal abdominal aortic aneurysm-and not candidate for aortic stent graft-will require open resection and grafting  Myoview performed last week-  ejection fraction 77%-low risk study Plan:     Plan resection and grafting of abdominal aortic aneurysm on Wednesday, March 6 Daughter is deployed in the armed services and [employer] will be in touch with us to achieve having daughter present at time of surgery Have discussed situation at length with patient and her husband and risks involved and they would like to proceed      

## 2011-11-19 ENCOUNTER — Other Ambulatory Visit: Payer: Self-pay

## 2011-12-01 ENCOUNTER — Other Ambulatory Visit: Payer: Self-pay

## 2011-12-01 ENCOUNTER — Encounter (HOSPITAL_COMMUNITY)
Admission: RE | Admit: 2011-12-01 | Discharge: 2011-12-01 | Disposition: A | Discharge status: Home / Self Care | Payer: Medicare Other | Source: Ambulatory Visit | Attending: Vascular Surgery | Admitting: Vascular Surgery

## 2011-12-01 ENCOUNTER — Encounter (HOSPITAL_COMMUNITY): Payer: Self-pay

## 2011-12-01 DIAGNOSIS — Z01810 Encounter for preprocedural cardiovascular examination: Secondary | ICD-10-CM | POA: Diagnosis not present

## 2011-12-01 DIAGNOSIS — I714 Abdominal aortic aneurysm, without rupture: Secondary | ICD-10-CM | POA: Diagnosis not present

## 2011-12-01 DIAGNOSIS — D62 Acute posthemorrhagic anemia: Secondary | ICD-10-CM | POA: Diagnosis not present

## 2011-12-01 DIAGNOSIS — J9600 Acute respiratory failure, unspecified whether with hypoxia or hypercapnia: Secondary | ICD-10-CM | POA: Diagnosis not present

## 2011-12-01 DIAGNOSIS — Z01811 Encounter for preprocedural respiratory examination: Secondary | ICD-10-CM | POA: Diagnosis not present

## 2011-12-01 HISTORY — DX: Endocrine disorder, unspecified: E34.9

## 2011-12-01 HISTORY — DX: Encounter for other specified aftercare: Z51.89

## 2011-12-01 HISTORY — DX: Reserved for inherently not codable concepts without codable children: IMO0001

## 2011-12-01 HISTORY — DX: Gastro-esophageal reflux disease without esophagitis: K21.9

## 2011-12-01 LAB — URINALYSIS, ROUTINE W REFLEX MICROSCOPIC
Glucose, UA: NEGATIVE mg/dL
Protein, ur: NEGATIVE mg/dL

## 2011-12-01 LAB — COMPREHENSIVE METABOLIC PANEL
Albumin: 3.4 g/dL — ABNORMAL LOW (ref 3.5–5.2)
BUN: 31 mg/dL — ABNORMAL HIGH (ref 6–23)
Creatinine, Ser: 1.41 mg/dL — ABNORMAL HIGH (ref 0.50–1.10)
Total Protein: 6.9 g/dL (ref 6.0–8.3)

## 2011-12-01 LAB — BLOOD GAS, ARTERIAL
Acid-Base Excess: 0.6 mmol/L (ref 0.0–2.0)
O2 Saturation: 96 %
Patient temperature: 98.6
TCO2: 26.2 mmol/L (ref 0–100)
pH, Arterial: 7.398 (ref 7.350–7.400)

## 2011-12-01 LAB — PROTIME-INR
INR: 1.01 (ref 0.00–1.49)
Prothrombin Time: 13.5 seconds (ref 11.6–15.2)

## 2011-12-01 LAB — DIFFERENTIAL
Basophils Absolute: 0 10*3/uL (ref 0.0–0.1)
Eosinophils Absolute: 0.1 10*3/uL (ref 0.0–0.7)
Eosinophils Relative: 1 % (ref 0–5)
Lymphs Abs: 1.8 10*3/uL (ref 0.7–4.0)
Monocytes Absolute: 1.2 10*3/uL — ABNORMAL HIGH (ref 0.1–1.0)
Neutro Abs: 9.1 10*3/uL — ABNORMAL HIGH (ref 1.7–7.7)
Neutrophils Relative %: 75 % (ref 43–77)

## 2011-12-01 LAB — CBC
HCT: 37.6 % (ref 36.0–46.0)
MCH: 30.3 pg (ref 26.0–34.0)
MCHC: 32.2 g/dL (ref 30.0–36.0)
MCV: 94.2 fL (ref 78.0–100.0)
RDW: 14.7 % (ref 11.5–15.5)

## 2011-12-01 LAB — SURGICAL PCR SCREEN: MRSA, PCR: NEGATIVE

## 2011-12-01 LAB — URINE MICROSCOPIC-ADD ON

## 2011-12-01 NOTE — Pre-Procedure Instructions (Signed)
20 Katie Pace  12/01/2011   Your procedure is scheduled on:  MARCH 6TH, Wednesday   Report to [facility] at  6:30 AM.  Call this number if you have problems the morning of surgery: [phone]   Remember:   Do not eat food:After Midnight TUESDAY.  May have clear liquids: up to 4 Hours before arrival TIME---2:30 AM.   Clear liquids include soda, tea, black coffee, apple or grape juice, broth.  Take these medicines the morning of surgery with A SIP OF WATER: ATENOLOL, BABY ASPIRIN,              NEXIUM, SYNTHYROID, TRAMADOL   Do not wear jewelry, make-up or nail polish.  Do not wear lotions, powders, or perfumes. You may wear deodorant.   Do not shave 48 hours prior to surgery.   Do not bring valuables to the hospital.  Contacts, dentures or bridgework may not be worn into surgery.  Leave suitcase in the car. After surgery it may be brought to your room.  For patients admitted to the hospital, checkout time is 11:00 AM the day of discharge.   Patients discharged the day of surgery will not be allowed to drive home.  Name and phone number of your driver: Katie Pace --  SPOUSE   Special Instructions: CHG Shower Use Special Wash: 1/2 bottle night before surgery and 1/2 bottle morning of surgery.   Please read over the following fact sheets that you were given: Pain Booklet, Coughing and Deep Breathing, Blood Transfusion Information, MRSA Information and Surgical Site Infection Prevention

## 2011-12-07 ENCOUNTER — Other Ambulatory Visit: Payer: Self-pay | Admitting: Family Medicine

## 2011-12-07 DIAGNOSIS — R55 Syncope and collapse: Secondary | ICD-10-CM

## 2011-12-08 ENCOUNTER — Ambulatory Visit
Admission: RE | Admit: 2011-12-08 | Discharge: 2011-12-08 | Disposition: A | Discharge status: Home / Self Care | Payer: Medicare Other | Source: Ambulatory Visit | Attending: Family Medicine | Admitting: Family Medicine

## 2011-12-08 ENCOUNTER — Other Ambulatory Visit: Payer: Self-pay | Admitting: Family Medicine

## 2011-12-08 DIAGNOSIS — R58 Hemorrhage, not elsewhere classified: Secondary | ICD-10-CM | POA: Diagnosis not present

## 2011-12-08 DIAGNOSIS — R55 Syncope and collapse: Secondary | ICD-10-CM | POA: Diagnosis not present

## 2011-12-08 DIAGNOSIS — G319 Degenerative disease of nervous system, unspecified: Secondary | ICD-10-CM | POA: Diagnosis not present

## 2011-12-08 MED ORDER — VANCOMYCIN HCL IN DEXTROSE 1-5 GM/200ML-% IV SOLN
1000.0000 mg | Freq: Once | INTRAVENOUS | Status: AC
  Administered 2011-12-09: 1000 mg via INTRAVENOUS
  Filled 2011-12-08: qty 200

## 2011-12-08 MED ORDER — SODIUM CHLORIDE 0.9 % IV SOLN: INTRAVENOUS | Status: DC

## 2011-12-09 ENCOUNTER — Ambulatory Visit (HOSPITAL_COMMUNITY): Payer: Medicare Other | Admitting: Certified Registered Nurse Anesthetist

## 2011-12-09 ENCOUNTER — Inpatient Hospital Stay (HOSPITAL_COMMUNITY)
Admission: RE | Admit: 2011-12-09 | Discharge: 2011-12-16 | DRG: 237 | Disposition: A | Discharge status: Home / Self Care | Payer: Medicare Other | Source: Ambulatory Visit | Attending: Vascular Surgery | Admitting: Vascular Surgery

## 2011-12-09 ENCOUNTER — Encounter (HOSPITAL_COMMUNITY): Payer: Self-pay | Admitting: Certified Registered Nurse Anesthetist

## 2011-12-09 ENCOUNTER — Encounter (HOSPITAL_COMMUNITY)
Admission: RE | Disposition: A | Discharge status: Home / Self Care | Payer: Self-pay | Source: Ambulatory Visit | Attending: Vascular Surgery

## 2011-12-09 ENCOUNTER — Inpatient Hospital Stay (HOSPITAL_COMMUNITY): Payer: Medicare Other

## 2011-12-09 DIAGNOSIS — I714 Abdominal aortic aneurysm, without rupture, unspecified: Principal | ICD-10-CM | POA: Diagnosis present

## 2011-12-09 DIAGNOSIS — Z87891 Personal history of nicotine dependence: Secondary | ICD-10-CM | POA: Diagnosis not present

## 2011-12-09 DIAGNOSIS — J90 Pleural effusion, not elsewhere classified: Secondary | ICD-10-CM | POA: Diagnosis not present

## 2011-12-09 DIAGNOSIS — Z96659 Presence of unspecified artificial knee joint: Secondary | ICD-10-CM

## 2011-12-09 DIAGNOSIS — Z8673 Personal history of transient ischemic attack (TIA), and cerebral infarction without residual deficits: Secondary | ICD-10-CM

## 2011-12-09 DIAGNOSIS — Z7902 Long term (current) use of antithrombotics/antiplatelets: Secondary | ICD-10-CM

## 2011-12-09 DIAGNOSIS — R918 Other nonspecific abnormal finding of lung field: Secondary | ICD-10-CM | POA: Diagnosis not present

## 2011-12-09 DIAGNOSIS — J9819 Other pulmonary collapse: Secondary | ICD-10-CM | POA: Diagnosis not present

## 2011-12-09 DIAGNOSIS — Z7982 Long term (current) use of aspirin: Secondary | ICD-10-CM

## 2011-12-09 DIAGNOSIS — Z96649 Presence of unspecified artificial hip joint: Secondary | ICD-10-CM

## 2011-12-09 DIAGNOSIS — E230 Hypopituitarism: Secondary | ICD-10-CM | POA: Diagnosis not present

## 2011-12-09 DIAGNOSIS — D62 Acute posthemorrhagic anemia: Secondary | ICD-10-CM | POA: Diagnosis not present

## 2011-12-09 DIAGNOSIS — Z79899 Other long term (current) drug therapy: Secondary | ICD-10-CM | POA: Diagnosis not present

## 2011-12-09 DIAGNOSIS — I6789 Other cerebrovascular disease: Secondary | ICD-10-CM | POA: Diagnosis not present

## 2011-12-09 DIAGNOSIS — Z01810 Encounter for preprocedural cardiovascular examination: Secondary | ICD-10-CM | POA: Diagnosis not present

## 2011-12-09 DIAGNOSIS — J9600 Acute respiratory failure, unspecified whether with hypoxia or hypercapnia: Secondary | ICD-10-CM | POA: Diagnosis not present

## 2011-12-09 DIAGNOSIS — Z01818 Encounter for other preprocedural examination: Secondary | ICD-10-CM

## 2011-12-09 DIAGNOSIS — E039 Hypothyroidism, unspecified: Secondary | ICD-10-CM | POA: Diagnosis present

## 2011-12-09 DIAGNOSIS — I517 Cardiomegaly: Secondary | ICD-10-CM | POA: Diagnosis not present

## 2011-12-09 DIAGNOSIS — K219 Gastro-esophageal reflux disease without esophagitis: Secondary | ICD-10-CM | POA: Diagnosis not present

## 2011-12-09 DIAGNOSIS — I10 Essential (primary) hypertension: Secondary | ICD-10-CM | POA: Diagnosis present

## 2011-12-09 DIAGNOSIS — E876 Hypokalemia: Secondary | ICD-10-CM | POA: Diagnosis not present

## 2011-12-09 DIAGNOSIS — E119 Type 2 diabetes mellitus without complications: Secondary | ICD-10-CM | POA: Diagnosis present

## 2011-12-09 DIAGNOSIS — I700 Atherosclerosis of aorta: Secondary | ICD-10-CM | POA: Diagnosis present

## 2011-12-09 DIAGNOSIS — D6489 Other specified anemias: Secondary | ICD-10-CM

## 2011-12-09 DIAGNOSIS — Z09 Encounter for follow-up examination after completed treatment for conditions other than malignant neoplasm: Secondary | ICD-10-CM | POA: Diagnosis not present

## 2011-12-09 HISTORY — PX: ABDOMINAL AORTIC ANEURYSM REPAIR: SHX42

## 2011-12-09 LAB — POCT I-STAT 3, ART BLOOD GAS (G3+)
Bicarbonate: 24.2 mEq/L — ABNORMAL HIGH (ref 20.0–24.0)
O2 Saturation: 99 %
Patient temperature: 37
TCO2: 28 mmol/L (ref 0–100)
TCO2: 25 mmol/L (ref 0–100)
pCO2 arterial: 27.2 mmHg — ABNORMAL LOW (ref 35.0–45.0)
pCO2 arterial: 37.2 mmHg (ref 35.0–45.0)
pH, Arterial: 7.596 — ABNORMAL HIGH (ref 7.350–7.400)
pO2, Arterial: 162 mmHg — ABNORMAL HIGH (ref 80.0–100.0)
pO2, Arterial: 133 mmHg — ABNORMAL HIGH (ref 80.0–100.0)

## 2011-12-09 LAB — POCT I-STAT 7, (LYTES, BLD GAS, ICA,H+H)
Bicarbonate: 23.4 mEq/L (ref 20.0–24.0)
Calcium, Ion: 0.9 mmol/L — ABNORMAL LOW (ref 1.12–1.32)
Calcium, Ion: 1.18 mmol/L (ref 1.12–1.32)
Hemoglobin: 12.2 g/dL (ref 12.0–15.0)
O2 Saturation: 100 %
O2 Saturation: 100 %
Patient temperature: 35.2
Patient temperature: 35.5
Potassium: 4 mEq/L (ref 3.5–5.1)
TCO2: 26 mmol/L (ref 0–100)
pCO2 arterial: 30.4 mmHg — ABNORMAL LOW (ref 35.0–45.0)
pCO2 arterial: 41.1 mmHg (ref 35.0–45.0)
pH, Arterial: 7.511 — ABNORMAL HIGH (ref 7.350–7.400)
pO2, Arterial: 410 mmHg — ABNORMAL HIGH (ref 80.0–100.0)
pO2, Arterial: 362 mmHg — ABNORMAL HIGH (ref 80.0–100.0)

## 2011-12-09 LAB — CBC
HCT: 32.3 % — ABNORMAL LOW (ref 36.0–46.0)
MCH: 30.3 pg (ref 26.0–34.0)
MCV: 88.3 fL (ref 78.0–100.0)
RBC: 3.66 MIL/uL — ABNORMAL LOW (ref 3.87–5.11)
WBC: 8.1 10*3/uL (ref 4.0–10.5)

## 2011-12-09 LAB — POCT I-STAT GLUCOSE
Glucose, Bld: 172 mg/dL — ABNORMAL HIGH (ref 70–99)
Operator id: 230421

## 2011-12-09 LAB — BASIC METABOLIC PANEL
BUN: 21 mg/dL (ref 6–23)
Calcium: 8.6 mg/dL (ref 8.4–10.5)
Chloride: 107 mEq/L (ref 96–112)
Creatinine, Ser: 1.15 mg/dL — ABNORMAL HIGH (ref 0.50–1.10)
GFR calc Af Amer: 53 mL/min — ABNORMAL LOW (ref >90)

## 2011-12-09 LAB — GLUCOSE, CAPILLARY
Glucose-Capillary: 99 mg/dL (ref 70–99)
Glucose-Capillary: 106 mg/dL — ABNORMAL HIGH (ref 70–99)

## 2011-12-09 LAB — MAGNESIUM: Magnesium: 1.5 mg/dL (ref 1.5–2.5)

## 2011-12-09 SURGERY — ANEURYSM ABDOMINAL AORTIC REPAIR
Anesthesia: General | Site: Abdomen | Wound class: Clean

## 2011-12-09 MED ORDER — PROPOFOL 10 MG/ML IV EMUL
5.0000 ug/kg/min | INTRAVENOUS | Status: DC
  Administered 2011-12-09: 30 ug/kg/min via INTRAVENOUS
  Administered 2011-12-09: 25 ug/kg/min via INTRAVENOUS
  Filled 2011-12-09 (×2): qty 100

## 2011-12-09 MED ORDER — ONDANSETRON HCL 4 MG/2ML IJ SOLN
4.0000 mg | Freq: Four times a day (QID) | INTRAMUSCULAR | Status: DC | PRN
  Administered 2011-12-10: 4 mg via INTRAVENOUS
  Filled 2011-12-09: qty 2

## 2011-12-09 MED ORDER — VANCOMYCIN HCL IN DEXTROSE 1-5 GM/200ML-% IV SOLN
1000.0000 mg | Freq: Two times a day (BID) | INTRAVENOUS | Status: AC
  Administered 2011-12-09 – 2011-12-10 (×2): 1000 mg via INTRAVENOUS
  Filled 2011-12-09 (×2): qty 200

## 2011-12-09 MED ORDER — POTASSIUM CHLORIDE IN NACL 20-0.9 MEQ/L-% IV SOLN
INTRAVENOUS | Status: DC
  Administered 2011-12-09 – 2011-12-10 (×2): via INTRAVENOUS
  Filled 2011-12-09 (×7): qty 1000

## 2011-12-09 MED ORDER — MIDAZOLAM HCL 5 MG/5ML IJ SOLN
INTRAMUSCULAR | Status: DC | PRN
  Administered 2011-12-09: 2 mg via INTRAVENOUS

## 2011-12-09 MED ORDER — POTASSIUM CHLORIDE CRYS ER 20 MEQ PO TBCR
20.0000 meq | EXTENDED_RELEASE_TABLET | ORAL | Status: DC | PRN
  Administered 2011-12-14: 40 meq via ORAL
  Filled 2011-12-09: qty 2
  Filled 2011-12-09: qty 1

## 2011-12-09 MED ORDER — HETASTARCH-ELECTROLYTES 6 % IV SOLN
INTRAVENOUS | Status: DC | PRN
  Administered 2011-12-09: 10:00:00 via INTRAVENOUS

## 2011-12-09 MED ORDER — METOPROLOL TARTRATE 1 MG/ML IV SOLN
2.0000 mg | INTRAVENOUS | Status: DC | PRN
  Administered 2011-12-10: 5 mg via INTRAVENOUS

## 2011-12-09 MED ORDER — 0.9 % SODIUM CHLORIDE (POUR BTL) OPTIME
TOPICAL | Status: DC | PRN
  Administered 2011-12-09: 1000 mL

## 2011-12-09 MED ORDER — PROPOFOL 10 MG/ML IV EMUL
INTRAVENOUS | Status: DC | PRN
  Administered 2011-12-09: 50 ug/kg/min via INTRAVENOUS

## 2011-12-09 MED ORDER — SODIUM CHLORIDE 0.9 % IV SOLN
INTRAVENOUS | Status: DC | PRN
  Administered 2011-12-09: 12:00:00 via INTRAVENOUS

## 2011-12-09 MED ORDER — HETASTARCH-ELECTROLYTES 6 % IV SOLN
INTRAVENOUS | Status: AC
  Filled 2011-12-09: qty 500

## 2011-12-09 MED ORDER — PROPOFOL 10 MG/ML IV EMUL
INTRAVENOUS | Status: DC | PRN
  Administered 2011-12-09: 150 mg via INTRAVENOUS

## 2011-12-09 MED ORDER — POTASSIUM CHLORIDE 10 MEQ/50ML IV SOLN
INTRAVENOUS | Status: AC
  Administered 2011-12-09: 10 meq
  Filled 2011-12-09: qty 50

## 2011-12-09 MED ORDER — LACTATED RINGERS IV SOLN
INTRAVENOUS | Status: DC | PRN
  Administered 2011-12-09 (×3): via INTRAVENOUS

## 2011-12-09 MED ORDER — DOPAMINE-DEXTROSE 3.2-5 MG/ML-% IV SOLN: 3.0000 ug/kg/min | INTRAVENOUS | Status: DC

## 2011-12-09 MED ORDER — HYDROMORPHONE HCL PF 1 MG/ML IJ SOLN: 0.2500 mg | INTRAMUSCULAR | Status: DC | PRN

## 2011-12-09 MED ORDER — DROPERIDOL 2.5 MG/ML IJ SOLN: 0.6250 mg | INTRAMUSCULAR | Status: DC | PRN

## 2011-12-09 MED ORDER — LACTATED RINGERS IV SOLN
INTRAVENOUS | Status: DC | PRN
  Administered 2011-12-09 (×2): via INTRAVENOUS

## 2011-12-09 MED ORDER — ACETAMINOPHEN 325 MG PO TABS: 325.0000 mg | ORAL_TABLET | ORAL | Status: DC | PRN

## 2011-12-09 MED ORDER — POTASSIUM CHLORIDE 10 MEQ/100ML IV SOLN
10.0000 meq | INTRAVENOUS | Status: DC | PRN
  Administered 2011-12-12 – 2011-12-13 (×8): 10 meq via INTRAVENOUS
  Filled 2011-12-09: qty 100
  Filled 2011-12-09: qty 400
  Filled 2011-12-09: qty 100

## 2011-12-09 MED ORDER — MANNITOL 25 % IV SOLN
INTRAVENOUS | Status: DC | PRN
  Administered 2011-12-09: 25 g via INTRAVENOUS

## 2011-12-09 MED ORDER — HEPARIN SODIUM (PORCINE) 1000 UNIT/ML IJ SOLN
INTRAMUSCULAR | Status: DC | PRN
  Administered 2011-12-09: 2000 [IU] via INTRAVENOUS
  Administered 2011-12-09: 6000 [IU] via INTRAVENOUS
  Administered 2011-12-09: 2000 [IU] via INTRAVENOUS

## 2011-12-09 MED ORDER — FAMOTIDINE IN NACL 20-0.9 MG/50ML-% IV SOLN
20.0000 mg | Freq: Two times a day (BID) | INTRAVENOUS | Status: DC
  Administered 2011-12-09 – 2011-12-12 (×7): 20 mg via INTRAVENOUS
  Filled 2011-12-09 (×7): qty 50

## 2011-12-09 MED ORDER — FENTANYL CITRATE 0.05 MG/ML IJ SOLN
INTRAMUSCULAR | Status: DC | PRN
  Administered 2011-12-09 (×4): 100 ug via INTRAVENOUS
  Administered 2011-12-09: 50 ug via INTRAVENOUS
  Administered 2011-12-09: 150 ug via INTRAVENOUS
  Administered 2011-12-09 (×3): 50 ug via INTRAVENOUS
  Administered 2011-12-09: 100 ug via INTRAVENOUS
  Administered 2011-12-09: 50 ug via INTRAVENOUS

## 2011-12-09 MED ORDER — SODIUM CHLORIDE 0.9 % IR SOLN
Status: DC | PRN
  Administered 2011-12-09: 10:00:00

## 2011-12-09 MED ORDER — NITROPRUSSIDE SODIUM 25 MG/ML IV SOLN
0.3000 ug/kg/min | INTRAVENOUS | Status: DC
  Administered 2011-12-09: 0.3 ug/kg/min via INTRAVENOUS
  Filled 2011-12-09: qty 2

## 2011-12-09 MED ORDER — PROPOFOL 10 MG/ML IV EMUL: 5.0000 ug/kg/min | INTRAVENOUS | Status: DC

## 2011-12-09 MED ORDER — HYDRALAZINE HCL 20 MG/ML IJ SOLN
10.0000 mg | INTRAMUSCULAR | Status: DC | PRN
  Administered 2011-12-09 – 2011-12-14 (×4): 10 mg via INTRAVENOUS
  Filled 2011-12-09 (×4): qty 0.5

## 2011-12-09 MED ORDER — METOPROLOL TARTRATE 1 MG/ML IV SOLN
5.0000 mg | Freq: Four times a day (QID) | INTRAVENOUS | Status: DC
  Administered 2011-12-10 – 2011-12-13 (×12): 5 mg via INTRAVENOUS
  Filled 2011-12-09 (×17): qty 5

## 2011-12-09 MED ORDER — SODIUM CHLORIDE 0.9 % IV SOLN: 500.0000 mL | Freq: Once | INTRAVENOUS | Status: AC | PRN

## 2011-12-09 MED ORDER — FENTANYL CITRATE 0.05 MG/ML IJ SOLN
INTRAMUSCULAR | Status: AC
  Administered 2011-12-09: 50 ug
  Filled 2011-12-09: qty 2

## 2011-12-09 MED ORDER — CALCIUM CHLORIDE 10 % IV SOLN
INTRAVENOUS | Status: DC | PRN
  Administered 2011-12-09 (×5): .2 g via INTRAVENOUS

## 2011-12-09 MED ORDER — MAGNESIUM SULFATE 40 MG/ML IJ SOLN
2.0000 g | Freq: Once | INTRAMUSCULAR | Status: AC | PRN
  Administered 2011-12-09: 2 g via INTRAVENOUS
  Filled 2011-12-09: qty 100

## 2011-12-09 MED ORDER — SODIUM CHLORIDE 0.9 % IV SOLN
50.0000 ug/h | INTRAVENOUS | Status: DC
  Administered 2011-12-09: 25 ug/h via INTRAVENOUS
  Filled 2011-12-09: qty 50

## 2011-12-09 MED ORDER — ROCURONIUM BROMIDE 100 MG/10ML IV SOLN
INTRAVENOUS | Status: DC | PRN
  Administered 2011-12-09: 20 mg via INTRAVENOUS
  Administered 2011-12-09: 10 mg via INTRAVENOUS
  Administered 2011-12-09: 50 mg via INTRAVENOUS
  Administered 2011-12-09: 10 mg via INTRAVENOUS

## 2011-12-09 MED ORDER — FENTANYL BOLUS VIA INFUSION
50.0000 ug | Freq: Four times a day (QID) | INTRAVENOUS | Status: DC | PRN
  Filled 2011-12-09: qty 100

## 2011-12-09 MED ORDER — OXYCODONE-ACETAMINOPHEN 5-325 MG PO TABS
1.0000 | ORAL_TABLET | ORAL | Status: DC | PRN
  Administered 2011-12-12: 2 via ORAL
  Administered 2011-12-13 (×3): 1 via ORAL
  Administered 2011-12-14 (×2): 2 via ORAL
  Administered 2011-12-14 – 2011-12-15 (×2): 1 via ORAL
  Administered 2011-12-15 – 2011-12-16 (×4): 2 via ORAL
  Filled 2011-12-09: qty 1
  Filled 2011-12-09 (×3): qty 2
  Filled 2011-12-09: qty 1
  Filled 2011-12-09: qty 2
  Filled 2011-12-09: qty 1
  Filled 2011-12-09: qty 2
  Filled 2011-12-09: qty 1
  Filled 2011-12-09: qty 2
  Filled 2011-12-09: qty 1
  Filled 2011-12-09: qty 2

## 2011-12-09 MED ORDER — INSULIN ASPART 100 UNIT/ML ~~LOC~~ SOLN
0.0000 [IU] | SUBCUTANEOUS | Status: DC
  Administered 2011-12-13 (×2): 2 [IU] via SUBCUTANEOUS
  Filled 2011-12-09 (×2): qty 3

## 2011-12-09 MED ORDER — MORPHINE SULFATE 2 MG/ML IJ SOLN: 2.0000 mg | INTRAMUSCULAR | Status: DC | PRN

## 2011-12-09 MED ORDER — LABETALOL HCL 5 MG/ML IV SOLN
10.0000 mg | INTRAVENOUS | Status: DC | PRN
  Administered 2011-12-10 – 2011-12-12 (×5): 10 mg via INTRAVENOUS
  Filled 2011-12-09 (×6): qty 4

## 2011-12-09 MED ORDER — ACETAMINOPHEN 650 MG RE SUPP: 325.0000 mg | RECTAL | Status: DC | PRN

## 2011-12-09 MED ORDER — SODIUM CHLORIDE 0.9 % IV SOLN
10.0000 mg | INTRAVENOUS | Status: DC | PRN
  Administered 2011-12-09: 40 ug/min via INTRAVENOUS

## 2011-12-09 MED ORDER — GLYCOPYRROLATE 0.2 MG/ML IJ SOLN
INTRAMUSCULAR | Status: DC | PRN
  Administered 2011-12-09: 0.2 mg via INTRAVENOUS

## 2011-12-09 MED ORDER — PROTAMINE SULFATE 10 MG/ML IV SOLN
INTRAVENOUS | Status: DC | PRN
  Administered 2011-12-09: 100 mg via INTRAVENOUS

## 2011-12-09 MED ORDER — POTASSIUM CHLORIDE CRYS ER 20 MEQ PO TBCR: 20.0000 meq | EXTENDED_RELEASE_TABLET | Freq: Every day | ORAL | Status: DC | PRN

## 2011-12-09 SURGICAL SUPPLY — 66 items
CANISTER SUCTION 2500CC (MISCELLANEOUS) ×2 IMPLANT
CATH EMB 3FR 40CM (CATHETERS) ×1 IMPLANT
CATH EMB 6FR 80CM (CATHETERS) ×1 IMPLANT
CLIP TI MEDIUM 24 (CLIP) ×2 IMPLANT
CLIP TI WIDE RED SMALL 24 (CLIP) ×2 IMPLANT
CLOTH BEACON ORANGE TIMEOUT ST (SAFETY) ×2 IMPLANT
COVER SURGICAL LIGHT HANDLE (MISCELLANEOUS) ×4 IMPLANT
DRAPE WARM FLUID 44X44 (DRAPE) ×2 IMPLANT
DRSG COVADERM 4X14 (GAUZE/BANDAGES/DRESSINGS) ×1 IMPLANT
DRSG COVADERM 4X8 (GAUZE/BANDAGES/DRESSINGS) ×1 IMPLANT
ELECT BLADE 6.5 EXT (BLADE) IMPLANT
ELECT REM PT RETURN 9FT ADLT (ELECTROSURGICAL) ×2
ELECTRODE REM PT RTRN 9FT ADLT (ELECTROSURGICAL) ×1 IMPLANT
FELT TEFLON 4 X1 (Mesh General) ×2 IMPLANT
GLOVE BIO SURGEON STRL SZ 6.5 (GLOVE) ×1 IMPLANT
GLOVE BIO SURGEON STRL SZ7 (GLOVE) ×1 IMPLANT
GLOVE BIOGEL PI IND STRL 6.5 (GLOVE) IMPLANT
GLOVE BIOGEL PI IND STRL 7.0 (GLOVE) IMPLANT
GLOVE BIOGEL PI IND STRL 7.5 (GLOVE) IMPLANT
GLOVE BIOGEL PI INDICATOR 6.5 (GLOVE) ×1
GLOVE BIOGEL PI INDICATOR 7.0 (GLOVE) ×3
GLOVE BIOGEL PI INDICATOR 7.5 (GLOVE) ×3
GLOVE ORTHOPEDIC STR SZ6.5 (GLOVE) ×1 IMPLANT
GLOVE SS BIOGEL STRL SZ 7 (GLOVE) ×1 IMPLANT
GLOVE SUPERSENSE BIOGEL SZ 7 (GLOVE) ×1
GLOVE SURG SS PI 7.5 STRL IVOR (GLOVE) ×3 IMPLANT
GOWN PREVENTION PLUS XLARGE (GOWN DISPOSABLE) ×2 IMPLANT
GOWN STRL NON-REIN LRG LVL3 (GOWN DISPOSABLE) ×8 IMPLANT
GRAFT HEMASHIELD 14X8MM (Vascular Products) IMPLANT
GRAFT HEMASHIELD 16X8MM (Vascular Products) ×1 IMPLANT
GRAFT VASC 20MMX15CM ×1 IMPLANT
INSERT FOGARTY 61MM (MISCELLANEOUS) ×2 IMPLANT
INSERT FOGARTY SM (MISCELLANEOUS) ×5 IMPLANT
KIT BASIN OR (CUSTOM PROCEDURE TRAY) ×2 IMPLANT
KIT ROOM TURNOVER OR (KITS) ×2 IMPLANT
LOOP VESSEL MAXI BLUE (MISCELLANEOUS) IMPLANT
LOOP VESSEL MINI RED (MISCELLANEOUS) IMPLANT
NS IRRIG 1000ML POUR BTL (IV SOLUTION) ×5 IMPLANT
PACK AORTA (CUSTOM PROCEDURE TRAY) ×2 IMPLANT
PAD ARMBOARD 7.5X6 YLW CONV (MISCELLANEOUS) ×4 IMPLANT
PUNCH AORTIC ROTATE 4.0MM (MISCELLANEOUS) ×1 IMPLANT
SPECIMEN JAR MEDIUM (MISCELLANEOUS) ×2 IMPLANT
SPONGE LAP 18X18 X RAY DECT (DISPOSABLE) ×2 IMPLANT
SPONGE LAP 4X18 X RAY DECT (DISPOSABLE) ×2 IMPLANT
STAPLER VISISTAT 35W (STAPLE) ×4 IMPLANT
SUT ETHIBOND 5 LR DA (SUTURE) IMPLANT
SUT PROLENE 1 XLH 60 (SUTURE) ×4 IMPLANT
SUT PROLENE 3 0 SH1 36 (SUTURE) ×6 IMPLANT
SUT PROLENE 4 0 RB 1 (SUTURE) ×2
SUT PROLENE 4-0 RB1 .5 CRCL 36 (SUTURE) IMPLANT
SUT PROLENE 5 0 CC 1 (SUTURE) ×1 IMPLANT
SUT PROLENE 5 0 CC1 (SUTURE) ×4 IMPLANT
SUT PROLENE 6 0 C 1 30 (SUTURE) ×5 IMPLANT
SUT PROLENE 6 0 CC (SUTURE) ×2 IMPLANT
SUT SILK 2 0 SH CR/8 (SUTURE) ×2 IMPLANT
SUT SILK 3 0 SH CR/8 (SUTURE) IMPLANT
SUT VIC AB 2-0 CTX 36 (SUTURE) ×2 IMPLANT
SUT VIC AB 3-0 MH 27 (SUTURE) ×6 IMPLANT
SUT VIC AB 3-0 SH 27 (SUTURE) ×4
SUT VIC AB 3-0 SH 27X BRD (SUTURE) IMPLANT
SYR 3ML LL SCALE MARK (SYRINGE) ×1 IMPLANT
SYR TB 1ML LUER SLIP (SYRINGE) ×1 IMPLANT
TOWEL OR 17X24 6PK STRL BLUE (TOWEL DISPOSABLE) ×4 IMPLANT
TOWEL OR 17X26 10 PK STRL BLUE (TOWEL DISPOSABLE) ×4 IMPLANT
TRAY FOLEY CATH 14FRSI W/METER (CATHETERS) ×2 IMPLANT
WATER STERILE IRR 1000ML POUR (IV SOLUTION) ×4 IMPLANT

## 2011-12-09 NOTE — Interval H&P Note (Signed)
History and Physical Interval Note:  12/09/2011 8:16 AM  Katie Pace  has presented today for surgery, with the diagnosis of Abdominal Aortic Aneurysm  The various methods of treatment have been discussed with the patient and family. After consideration of risks, benefits and other options for treatment, the patient has consented to  Procedure(s) (LRB): ANEURYSM ABDOMINAL AORTIC REPAIR (N/A) as a surgical intervention .  The patients' history has been reviewed, patient examined, no change in status, stable for surgery.  I have reviewed the patients' chart and labs.  Questions were answered to the patient's satisfaction.     Josephina Gip

## 2011-12-09 NOTE — OR Nursing (Addendum)
Closing incision call to 2300. call @1340 .

## 2011-12-09 NOTE — Preoperative (Signed)
Beta Blockers   Reason not to administer Beta Blockers:Not Applicable 

## 2011-12-09 NOTE — Consult Note (Addendum)
Name: Katie Pace MRN: [id number] DOB: 09/12/1936    LOS: 0  PCCM CONSULT NOTE  History of Present Illness: 76yo female with hx HTN, DM, ?chronic renal insufficiency who presented 74/6 for planned AAA by Dr. Hart [place].  Surgery was lengthy with mod amt blood loss and blood products given.  Pt remained intubated post op in ICU and PCCM consulted for vent management.   Lines / Drains: ETT 3/6>>> R IJ PAC 3/6>>> L rad aline 3/6>>>  Cultures: none  Antibiotics: Vanc (periop) 3/6>>  Tests / Events: 3/6 AAA repair, remained intubated post op   Past Medical History  Diagnosis Date  . Hypertension   . Hypothyroidism   . Stroke     Ischemic x 2       last one 2007  . Diabetes mellitus     type 11  . Endocrine disturbance     gland has not  worked until 1970.Marland Kitchen..dx in 1977  . Blood transfusion     back in 1973  . GERD (gastroesophageal reflux disease)     on meds to help....meds do well    Past Surgical History  Procedure Date  . Spine surgery     Lumbar Diskectomy  . Joint replacement May 2009    Right knee  . Joint replacement April 2008    Right Hip prothesis  . Carotid endarterectomy Dec. 2009  . Cardiac catheterization     1980  &  2009  . Appendectomy     1960  . Cesarean section   . Back surgery     1990's  . Abdominal hysterectomy 1973    1973  . Fracture surgery     left ankle    Prior to Admission medications   Medication Sig Start Date End Date Taking? Authorizing Provider  alendronate (FOSAMAX) 70 MG tablet Take 70 mg by mouth every 7 (seven) days. Take with a full glass of water on an empty stomach.   Yes Historical Provider, MD  amitriptyline (ELAVIL) 25 MG tablet Take 25 mg by mouth at bedtime.   Yes Historical Provider, MD  aspirin 81 MG tablet Take 160 mg by mouth daily.   Yes Historical Provider, MD  atenolol (TENORMIN) 50 MG tablet Take 50 mg by mouth daily.   Yes Historical Provider, MD  cholecalciferol (VITAMIN D) 1000 UNITS tablet Take 3,000  Units by mouth daily.   Yes Historical Provider, MD  clotrimazole-betamethasone (LOTRISONE) cream Apply topically 2 (two) times daily.   Yes Historical Provider, MD  Cyanocobalamin 1000 MCG/15ML LIQD Take by mouth.   Yes Historical Provider, MD  diltiazem (CARDIZEM CD) 240 MG 24 hr capsule Take 240 mg by mouth daily.   Yes Historical Provider, MD  dipyridamole-aspirin (AGGRENOX) 25-200 MG per 12 hr capsule Take 1 capsule by mouth 2 (two) times daily.   Yes Historical Provider, MD  esomeprazole (NEXIUM) 40 MG capsule Take 40 mg by mouth daily before breakfast.   Yes Historical Provider, MD  glucose monitoring kit (FREESTYLE) monitoring kit 1 each by Does not apply route as needed.   Yes Historical Provider, MD  hydrocortisone (CORTEF) 20 MG tablet Take 20 mg by mouth daily.   Yes Historical Provider, MD  levothyroxine (SYNTHROID, LEVOTHROID) 75 MCG tablet Take 75 mcg by mouth daily.   Yes Historical Provider, MD  ondansetron [facility]) 4 MG/5ML solution Take by mouth once.   Yes Historical Provider, MD  pioglitazone (ACTOS) 45 MG tablet Take 45 mg by mouth daily.  Yes Historical Provider, MD  potassium citrate (UROCIT-K) 10 MEQ (1080 MG) SR tablet Take 10 mEq by mouth 3 (three) times daily with meals.   Yes Historical Provider, MD  rosuvastatin (CRESTOR) 5 MG tablet Take 5 mg by mouth daily.   Yes Historical Provider, MD  Somatropin (HUMATROPE) 5 MG SOLR Inject as directed.   Yes Historical Provider, MD  traMADol (ULTRAM) 50 MG tablet Take 50 mg by mouth every 6 (six) hours as needed.   Yes Historical Provider, MD  triamterene-hydrochlorothiazide (MAXZIDE-25) 37.5-25 MG per tablet Take 1 tablet by mouth daily.   Yes Historical Provider, MD   Allergies Allergies  Allergen Reactions  . Ambien Other (See Comments)    Stroke  . Codeine Nausea Only  . Dilaudid (Hydromorphone Hcl) Nausea Only  . Lincocin Other (See Comments)    Big bumps bilateral legs  . Macrodantin Hives  . Azithromycin Rash  .  Penicillins Rash    Family History Family History  Problem Relation Age of Onset  . Heart disease Mother   . Peripheral vascular disease Mother   . Alcohol abuse Father   . Heart disease Sister   . Heart disease Brother     Social History  reports that she quit smoking about 48 years ago. Her smoking use included Cigarettes. She quit after 4 years of use. She has never used smokeless tobacco. She reports that she does not drink alcohol or use illicit drugs.  Review Of Systems  Unable, pt sedated on vent  Vital Signs: Temp:  [95.9 F (35.5 C)-97.3 F (36.3 C)] 95.9 F (35.5 C) (03/06 1415) Pulse Rate:  [51-79] 53  (03/06 1415) Resp:  [12-20] 12  (03/06 1415) BP: (148-156)/(58-72) 156/58 mmHg (03/06 1403) SpO2:  [95 %-100 %] 95 % (03/06 1415) Arterial Line BP: (131-152)/(56) 131/56 mmHg (03/06 1415) FiO2 (%):  [49.7 %-50.4 %] 49.7 % (03/06 1415) Weight:  [154 lb 5.2 oz (70 kg)] 154 lb 5.2 oz (70 kg) (03/06 1415)   Physical Examination:  General: chronically ill appearing, NAD HEENT: mm moist, ETT Neuro: sedated on vent, RASS= _-1__ Lungs: resps even non labored on vent, diminished diffusely. Cards: s1s2 rrr, no m/r/g Abd: soft, round, non tender, +bs, midline incision. Ext: warm and dry, no edema   Ventilator settings: Vent Mode:  [-] SIMV FiO2 (%):  [49.7 %-50.4 %] 49.7 % Set Rate:  [12 bmp] 12 bmp Vt Set:  [640 mL] 640 mL PEEP:  [5 cmH20] 5 cmH20 Pressure Support:  [10 cmH20] 10 cmH20 Plateau Pressure:  [19 cmH20] 19 cmH20  PRVC 12, Tv 440, PEEP 5 and FiO2 60%, ABG pending.  Labs and Imaging:   CBC    Component Value Date/Time   WBC 12.3* 12/01/2011 1108   RBC 3.99 12/01/2011 1108   HGB 15.0 12/09/2011 1233   HCT 44.0 12/09/2011 1233   PLT 221 12/01/2011 1108   MCV 94.2 12/01/2011 1108   MCH 30.3 12/01/2011 1108   MCHC 32.2 12/01/2011 1108   RDW 14.7 12/01/2011 1108   LYMPHSABS 1.8 12/01/2011 1108   MONOABS 1.2* 12/01/2011 1108   EOSABS 0.1 12/01/2011 1108    BASOSABS 0.0 12/01/2011 1108   BMET    Component Value Date/Time   NA 139 12/09/2011 1233   K 4.3 12/09/2011 1233   CL 102 12/01/2011 1108   CO2 23 12/01/2011 1108   GLUCOSE 151* 12/09/2011 1227   BUN 31* 12/01/2011 1108   CREATININE 1.41* 12/01/2011 1108   CREATININE 2.17*  11/11/2011 1018   CALCIUM 9.3 12/01/2011 1108   GFRNONAA 36* 12/01/2011 1108   GFRAA 42* 12/01/2011 1108   ABG    Component Value Date/Time   PHART 7.596* 12/09/2011 1417   PCO2ART 27.2* 12/09/2011 1417   PO2ART 162.0* 12/09/2011 1417   HCO3 26.7* 12/09/2011 1417   TCO2 28 12/09/2011 1417   ACIDBASEDEF 3.0* 12/09/2011 1233   O2SAT 100.0 12/09/2011 1417   Mr Brain Wo Contrast  12/08/2011  *RADIOLOGY REPORT*  Clinical Data: Syncope and collapse.  MRI HEAD WITHOUT CONTRAST  Technique:  Multiplanar, multiecho pulse sequences of the brain and surrounding structures were obtained according to standard protocol without intravenous contrast.  Comparison: MRI 11/04/2010  Findings: Intravenous contrast not given due to GFR 37.  Negative for acute infarct.  Extensive chronic ischemic change is present.  Chronic infarcts are present throughout the white matter bilaterally.  Chronic left occipital infarct is unchanged.  Chronic ischemia is present in the pons bilaterally and in the cerebellum bilaterally also unchanged.  Chronic area of hemorrhage in the right lower parietal lobe is unchanged from the  prior study. Negative for mass or fluid collection.  Paranasal sinuses are clear.  IMPRESSION: Atrophy and extensive chronic ischemic change.  Chronic hemorrhage right parietal lobe.  No acute abnormality and no change from the MRI of 11/04/2010  Original Report Authenticated By: Camelia Phenes, M.D.   Assessment and Plan:  1. Acute vent dependent respiratory failure - post op AAA repair per vascular surgery. Significant blood loss ( ) with multiple blood products given.  No known underlying pulmonary issues.  PLAN -  Change to [facility] for rest  overnight Decrease RR F/u ABG now and in am  F/u CXR now and in am  Watch for TRALI with mult blood products given  WUA/SBT in am  Cont diprivan overnight for sedation   WHITEHEART,KATHRYN, NP 12/09/2011  2:35 PM Pager: [phone]  *Care during the described time interval was provided by me and/or other providers on the critical care team. I have reviewed this patient's available data, including medical history, events of note, physical examination and test results as part of my evaluation.  CC time 45 min  Patient seen and examined, agree with above note.  I dictated the care and orders written for this patient under my direction.  Koren Bound, M.D. [phone]

## 2011-12-09 NOTE — Anesthesia Procedure Notes (Signed)
Procedure Name: Intubation Date/Time: 12/09/2011 8:47 AM Performed by: Shirlyn Goltz, TOM Pre-anesthesia Checklist: Patient identified, Emergency Drugs available, Suction available, Patient being monitored and Timeout performed Patient Re-evaluated:Patient Re-evaluated prior to inductionOxygen Delivery Method: Circle system utilized Preoxygenation: Pre-oxygenation with 100% oxygen Intubation Type: IV induction Ventilation: Mask ventilation without difficulty Laryngoscope Size: Miller and 2 Grade View: Grade II Tube type: Oral Tube size: 8.0 mm Number of attempts: 1 Airway Equipment and Method: Stylet Secured at: 22 cm Tube secured with: Tape Dental Injury: Teeth and Oropharynx as per pre-operative assessment

## 2011-12-09 NOTE — Anesthesia Preprocedure Evaluation (Addendum)
Anesthesia Evaluation  Patient identified by MRN, date of birth, ID band Patient awake    Reviewed: Allergy & Precautions, H&P , NPO status , Patient's Chart, lab work & pertinent test results, reviewed documented beta blocker date and time   History of Anesthesia Complications Negative for: history of anesthetic complications  Airway Mallampati: III TM Distance: <3 FB Neck ROM: Full    Dental  (+) Teeth Intact and Caps   Pulmonary former smoker breath sounds clear to auscultation  Pulmonary exam normal       Cardiovascular hypertension, Pt. on medications and Pt. on home beta blockers Rhythm:Regular Rate:Normal     Neuro/Psych CVA, No Residual Symptoms    GI/Hepatic GERD-  Controlled and Medicated,  Endo/Other  Diabetes mellitus-, Well ControlledHypothyroidism Morbid obesity  Renal/GU      Musculoskeletal   Abdominal   Peds  Hematology   Anesthesia Other Findings   Reproductive/Obstetrics                          Anesthesia Physical Anesthesia Plan  ASA: III  Anesthesia Plan: General   Post-op Pain Management:    Induction: Intravenous  Airway Management Planned: Oral ETT  Additional Equipment: PA Cath and Arterial line  Intra-op Plan:   Post-operative Plan: Possible Post-op intubation/ventilation  Informed Consent: I have reviewed the patients History and Physical, chart, labs and discussed the procedure including the risks, benefits and alternatives for the proposed anesthesia with the patient or authorized representative who has indicated his/her understanding and acceptance.   Dental advisory given  Plan Discussed with: CRNA, Anesthesiologist and Surgeon  Anesthesia Plan Comments:         Anesthesia Quick Evaluation

## 2011-12-09 NOTE — H&P (View-Only) (Signed)
Subjective:     Patient ID: Katie Pace, female   DOB: August 14, 1936, 76 y.o.   MRN: [id number]  HPI this 76 year old female returns today for further discussion regarding her abdominal aortic aneurysm. Her nausea has been managed by anti-emetic medications. She is having no, or back pain. She had a CT scan performed last week which I have reviewed by computer. It was done without contrast because her creatinine is 1.9 and did not improve much with hydration she is not a candidate for aortic stent grafting because of a very short angulated neck. She also has significant calcification in her iliac arteries.  Past Medical History  Diagnosis Date  . Hypertension   . Hypothyroidism   . Diabetes mellitus   . Stroke     Ischemic    History  Substance Use Topics  . Smoking status: Former Smoker -- 4 years    Types: Cigarettes    Quit date: 10/06/1963  . Smokeless tobacco: Never Used  . Alcohol Use: No    Family History  Problem Relation Age of Onset  . Heart disease Mother   . Peripheral vascular disease Mother   . Alcohol abuse Father   . Heart disease Sister   . Heart disease Brother     Allergies  Allergen Reactions  . Ambien Other (See Comments)    Stroke  . Codeine Nausea Only  . Dilaudid (Hydromorphone Hcl) Nausea Only  . Lincocin Other (See Comments)    Big bumps bilateral legs  . Macrodantin Hives  . Azithromycin Rash  . Penicillins Rash    Current outpatient prescriptions:alendronate (FOSAMAX) 70 MG tablet, Take 70 mg by mouth every 7 (seven) days. Take with a full glass of water on an empty stomach., Disp: , Rfl: ;  amitriptyline (ELAVIL) 25 MG tablet, Take 25 mg by mouth at bedtime., Disp: , Rfl: ;  aspirin 81 MG tablet, Take 160 mg by mouth daily., Disp: , Rfl: ;  atenolol (TENORMIN) 50 MG tablet, Take 50 mg by mouth daily., Disp: , Rfl:  cholecalciferol (VITAMIN D) 1000 UNITS tablet, Take 3,000 Units by mouth daily., Disp: , Rfl: ;  clotrimazole-betamethasone  (LOTRISONE) cream, Apply topically 2 (two) times daily., Disp: , Rfl: ;  Cyanocobalamin 1000 MCG/15ML LIQD, Take by mouth., Disp: , Rfl: ;  diltiazem (CARDIZEM CD) 240 MG 24 hr capsule, Take 240 mg by mouth daily., Disp: , Rfl: ;  diltiazem (TIAZAC) 240 MG 24 hr capsule, Take 240 mg by mouth daily., Disp: , Rfl:  dipyridamole-aspirin (AGGRENOX) 25-200 MG per 12 hr capsule, Take 1 capsule by mouth 2 (two) times daily., Disp: , Rfl: ;  esomeprazole (NEXIUM) 40 MG capsule, Take 40 mg by mouth daily before breakfast., Disp: , Rfl: ;  glucose monitoring kit (FREESTYLE) monitoring kit, 1 each by Does not apply route as needed., Disp: , Rfl: ;  hydrocortisone (CORTEF) 20 MG tablet, Take 20 mg by mouth daily., Disp: , Rfl:  levothyroxine (SYNTHROID, LEVOTHROID) 75 MCG tablet, Take 75 mcg by mouth daily., Disp: , Rfl: ;  ondansetron (ZOFRAN) 4 MG/5ML solution, Take by mouth once., Disp: , Rfl: ;  pioglitazone (ACTOS) 45 MG tablet, Take 45 mg by mouth daily., Disp: , Rfl: ;  potassium citrate (UROCIT-K) 10 MEQ (1080 MG) SR tablet, Take 10 mEq by mouth 3 (three) times daily with meals., Disp: , Rfl:  rosuvastatin (CRESTOR) 5 MG tablet, Take 5 mg by mouth daily., Disp: , Rfl: ;  Somatropin (HUMATROPE) 5 MG SOLR, Inject  as directed., Disp: , Rfl: ;  traMADol (ULTRAM) 50 MG tablet, Take 50 mg by mouth every 6 (six) hours as needed., Disp: , Rfl: ;  triamterene-hydrochlorothiazide (MAXZIDE-25) 37.5-25 MG per tablet, Take 1 tablet by mouth daily., Disp: , Rfl:   BP 115/70  Pulse 60  Resp 20  Ht 5' 4.5" (1.638 m)  Wt 150 lb (68.04 kg)  BMI 25.35 kg/m2  Body mass index is 25.35 kg/(m^2).        Review of Systems denies chest pain, dyspnea on exertion, PND, orthopnea, hemoptysis, claudication, lateralizing weakness, amaurosis fugax, diplopia, blurred vision, syncope she does have chronic mechanical back pain. She has had some nausea recently which is improving. Denies abdominal pain. All other systems are  negative    Objective:   Physical Exam blood pressure 115/70 heart rate 60 respirations 20 Gen.-alert and oriented x3 in no apparent distress HEENT normal for age Lungs no rhonchi or wheezing Cardiovascular regular rhythm no murmurs carotid pulses 3+ palpable no bruits audible Abdomen soft nontender 5-6 cm pulsatile mass in the midepigastrium-nontender Musculoskeletal free of  major deformities Skin clear -no rashes Neurologic normal Lower extremities 3+ femoral and popliteal pulses palpable bilaterally. Distal pulses difficult to palpate. Both feet are well-perfused    Assessment:     Infrarenal abdominal aortic aneurysm-and not candidate for aortic stent graft-will require open resection and grafting  Myoview performed last week-  ejection fraction 77%-low risk study Plan:     Plan resection and grafting of abdominal aortic aneurysm on Wednesday, March 6 Daughter is deployed in the armed services and [employer] will be in touch with [place] to achieve having daughter present at time of surgery Have discussed situation at length with patient and her husband and risks involved and they would like to proceed

## 2011-12-09 NOTE — Transfer of Care (Signed)
Immediate Anesthesia Transfer of Care Note  Patient: Katie Pace  Procedure(s) Performed: Procedure(s) (LRB): ANEURYSM ABDOMINAL AORTIC REPAIR (N/A) AORTIC ENDARTERECETOMY (N/A)  Patient Location: SICU  Anesthesia Type: General  Level of Consciousness: sedated  Airway & Oxygen Therapy: Patient remains intubated per anesthesia plan and Patient placed on Ventilator (see vital sign flow sheet for setting)  Post-op Assessment: Report given to PACU RN and Post -op Vital signs reviewed and stable  Post vital signs: Reviewed and stable  Complications: No apparent anesthesia complications

## 2011-12-10 ENCOUNTER — Inpatient Hospital Stay (HOSPITAL_COMMUNITY): Payer: Medicare Other

## 2011-12-10 DIAGNOSIS — D6489 Other specified anemias: Secondary | ICD-10-CM | POA: Diagnosis not present

## 2011-12-10 DIAGNOSIS — I517 Cardiomegaly: Secondary | ICD-10-CM | POA: Diagnosis not present

## 2011-12-10 DIAGNOSIS — I714 Abdominal aortic aneurysm, without rupture: Secondary | ICD-10-CM | POA: Diagnosis not present

## 2011-12-10 DIAGNOSIS — J9600 Acute respiratory failure, unspecified whether with hypoxia or hypercapnia: Secondary | ICD-10-CM | POA: Diagnosis not present

## 2011-12-10 DIAGNOSIS — J90 Pleural effusion, not elsewhere classified: Secondary | ICD-10-CM | POA: Diagnosis not present

## 2011-12-10 DIAGNOSIS — Z01810 Encounter for preprocedural cardiovascular examination: Secondary | ICD-10-CM | POA: Diagnosis not present

## 2011-12-10 DIAGNOSIS — E230 Hypopituitarism: Secondary | ICD-10-CM | POA: Diagnosis not present

## 2011-12-10 DIAGNOSIS — J9819 Other pulmonary collapse: Secondary | ICD-10-CM | POA: Diagnosis not present

## 2011-12-10 DIAGNOSIS — D62 Acute posthemorrhagic anemia: Secondary | ICD-10-CM | POA: Diagnosis not present

## 2011-12-10 LAB — COMPREHENSIVE METABOLIC PANEL
Albumin: 1.3 g/dL — ABNORMAL LOW (ref 3.5–5.2)
Alkaline Phosphatase: 21 U/L — ABNORMAL LOW (ref 39–117)
BUN: 23 mg/dL (ref 6–23)
Calcium: 6.5 mg/dL — ABNORMAL LOW (ref 8.4–10.5)
Creatinine, Ser: 1.26 mg/dL — ABNORMAL HIGH (ref 0.50–1.10)
GFR calc Af Amer: 48 mL/min — ABNORMAL LOW (ref >90)
Glucose, Bld: 87 mg/dL (ref 70–99)
Potassium: 4.2 mEq/L (ref 3.5–5.1)
Total Protein: 2.5 g/dL — ABNORMAL LOW (ref 6.0–8.3)

## 2011-12-10 LAB — PREPARE FRESH FROZEN PLASMA: Unit division: 0

## 2011-12-10 LAB — CBC
HCT: 25.1 % — ABNORMAL LOW (ref 36.0–46.0)
Hemoglobin: 8.4 g/dL — ABNORMAL LOW (ref 12.0–15.0)
MCH: 30.3 pg (ref 26.0–34.0)
MCHC: 33.5 g/dL (ref 30.0–36.0)
MCV: 90.6 fL (ref 78.0–100.0)
Platelets: 79 10*3/uL — ABNORMAL LOW (ref 150–400)
RBC: 3.71 MIL/uL — ABNORMAL LOW (ref 3.87–5.11)
RDW: 16.5 % — ABNORMAL HIGH (ref 11.5–15.5)
RDW: 17.3 % — ABNORMAL HIGH (ref 11.5–15.5)
WBC: 12.5 10*3/uL — ABNORMAL HIGH (ref 4.0–10.5)

## 2011-12-10 LAB — GLUCOSE, CAPILLARY
Glucose-Capillary: 75 mg/dL (ref 70–99)
Glucose-Capillary: 65 mg/dL — ABNORMAL LOW (ref 70–99)
Glucose-Capillary: 75 mg/dL (ref 70–99)

## 2011-12-10 LAB — PROTIME-INR: INR: 1.48 (ref 0.00–1.49)

## 2011-12-10 LAB — APTT: aPTT: 39 seconds — ABNORMAL HIGH (ref 24–37)

## 2011-12-10 LAB — PREPARE PLATELET PHERESIS: Unit division: 0

## 2011-12-10 LAB — MAGNESIUM: Magnesium: 1.7 mg/dL (ref 1.5–2.5)

## 2011-12-10 LAB — AMYLASE: Amylase: 59 U/L (ref 0–105)

## 2011-12-10 MED ORDER — SODIUM CHLORIDE 0.9 % IV SOLN: 35.0000 ug/h | INTRAVENOUS | Status: DC

## 2011-12-10 MED ORDER — MAGNESIUM SULFATE IN D5W 10-5 MG/ML-% IV SOLN
1.0000 g | Freq: Once | INTRAVENOUS | Status: AC
  Administered 2011-12-10: 1 g via INTRAVENOUS
  Filled 2011-12-10 (×2): qty 100

## 2011-12-10 MED ORDER — SODIUM CHLORIDE 0.9 % IV SOLN: 35.0000 ug | INTRAVENOUS | Status: DC

## 2011-12-10 MED ORDER — MAGNESIUM SULFATE 50 % IJ SOLN: 1.0000 g | Freq: Once | INTRAMUSCULAR | Status: DC

## 2011-12-10 MED ORDER — MORPHINE SULFATE 2 MG/ML IJ SOLN
2.0000 mg | INTRAMUSCULAR | Status: DC | PRN
  Administered 2011-12-10: 4 mg via INTRAVENOUS
  Administered 2011-12-10: 2 mg via INTRAVENOUS
  Administered 2011-12-10 – 2011-12-11 (×2): 4 mg via INTRAVENOUS
  Administered 2011-12-11 – 2011-12-12 (×9): 2 mg via INTRAVENOUS
  Filled 2011-12-10 (×2): qty 1
  Filled 2011-12-10: qty 2
  Filled 2011-12-10 (×2): qty 1
  Filled 2011-12-10: qty 2
  Filled 2011-12-10 (×2): qty 1
  Filled 2011-12-10: qty 2
  Filled 2011-12-10 (×4): qty 1

## 2011-12-10 MED ORDER — FUROSEMIDE 10 MG/ML IJ SOLN
20.0000 mg | Freq: Once | INTRAMUSCULAR | Status: AC
  Administered 2011-12-10: 20 mg via INTRAVENOUS
  Filled 2011-12-10 (×2): qty 2

## 2011-12-10 MED ORDER — SOMATROPIN 5 MG IJ SOLR
0.3000 mg | Freq: Every day | INTRAMUSCULAR | Status: DC
  Administered 2011-12-10 – 2011-12-15 (×6): 0.3 mg via SUBCUTANEOUS
  Filled 2011-12-10: qty 1

## 2011-12-10 MED ORDER — LEVOTHYROXINE SODIUM 100 MCG IV SOLR
35.0000 ug | Freq: Every day | INTRAVENOUS | Status: DC
  Administered 2011-12-10 – 2011-12-12 (×3): 36 ug via INTRAVENOUS
  Filled 2011-12-10 (×4): qty 1.8

## 2011-12-10 MED ORDER — CHLORHEXIDINE GLUCONATE 0.12 % MT SOLN
OROMUCOSAL | Status: AC
  Administered 2011-12-10: 15 mL
  Filled 2011-12-10: qty 15

## 2011-12-10 MED ORDER — MORPHINE SULFATE 4 MG/ML IJ SOLN
INTRAMUSCULAR | Status: AC
  Administered 2011-12-10: 4 mg
  Filled 2011-12-10: qty 1

## 2011-12-10 MED ORDER — FENTANYL CITRATE 0.05 MG/ML IJ SOLN
25.0000 ug | INTRAMUSCULAR | Status: DC | PRN
  Administered 2011-12-10: 50 ug via INTRAVENOUS
  Filled 2011-12-10: qty 2

## 2011-12-10 MED ORDER — HYDROCORTISONE SOD SUCCINATE 100 MG IJ SOLR
50.0000 mg | Freq: Two times a day (BID) | INTRAMUSCULAR | Status: DC
  Administered 2011-12-10 – 2011-12-11 (×3): 50 mg via INTRAVENOUS
  Filled 2011-12-10 (×5): qty 1

## 2011-12-10 NOTE — Progress Notes (Signed)
eLink Physician-Brief Progress Note Patient Name: Katie Pace DOB: [birth year] MRN: [id number]  Date of Service  12/10/2011   HPI/Events of Note   mag 1.7  eICU Interventions  Replete with 1gm mag   Intervention Category Minor Interventions: Electrolytes abnormality - evaluation and management  Katie Pace 12/10/2011, 7:34 PM

## 2011-12-10 NOTE — Procedures (Signed)
Extubation Procedure Note  Patient Details:   Name: QUINITA KOSTELECKY DOB: 1936-04-02 MRN: [id number]   Airway Documentation:     Evaluation  O2 sats: stable throughout and currently acceptable Complications: No apparent complications Patient did tolerate procedure well. Bilateral Breath Sounds: Clear   Yes Pt awake and alert, extubated per MD order, placed on 3L [place], sat 100%, BBS cl, positive cuff leak. Pt able to vocalize.  Arloa Koh 12/10/2011, 8:10 AM 2

## 2011-12-10 NOTE — Clinical Documentation Improvement (Signed)
Anemia Blood Loss Clarification  THIS DOCUMENT IS NOT A PERMANENT PART OF THE MEDICAL RECORD  RESPOND TO THE THIS QUERY, FOLLOW THE INSTRUCTIONS BELOW:  1. If needed, update documentation for the patient's encounter via the notes activity.  2. Access this query again and click edit on the In [employer].  3. After updating, or not, click F2 to complete all highlighted (required) fields concerning your review. Select "additional documentation in the medical record" OR "no additional documentation provided".  4. Click Sign note button.  5. The deficiency will fall out of your In Basket *Please let us know if you are not able to complete this workflow by phone or e-mail (listed below).        12/10/11  Dear Beverely Low Marton Redwood  In an effort to better capture your patient's severity of illness, reflect appropriate length of stay and utilization of resources, a review of the patient medical record has revealed the following indicators.    Based on your clinical judgment, please clarify and document in a progress note and/or discharge summary the clinical condition associated with the following supporting information:  In responding to this query please exercise your independent judgment.  The fact that a query is asked, does not imply that any particular answer is desired or expected.  Possible Clinical Conditions?   " Expected Acute Blood Loss Anemia  " Acute Blood Loss Anemia  " Precipitous drop in Hematocrit  " Other Condition________________  " Cannot Clinically Determine  Supporting Information:  Signs and Symptoms:  Per Anesthesia record: EBL: , received cell saver , PRBC's , FFP 687. Per PCCM, 3/7 note: anemia, significant blood loss.  Diagnostics: H&H: 3/7:     8.4/25.1 3/6:   11.1/32.3  Transfusion: Per Anesthesia record, received PRBC's, FFP, and Platelets.  Reviewed: additional documentation in the medical record  Thank You,  Marciano Sequin,  Clinical Documentation Specialist:  Pager: [phone]  Health Information Management [place]  I have documented in progress noted dated March 11 that patient had expected acute blood loss anemia postoperatively

## 2011-12-10 NOTE — Evaluation (Signed)
Physical Therapy Evaluation Patient Details Name: JANAUTICA NETZLEY MRN: [id number] DOB: [birth year] Today's Date: 12/10/2011  Problem List:  Patient Active Problem List  Diagnoses  . Abdominal aneurysm without mention of rupture    Past Medical History:  Past Medical History  Diagnosis Date  . Hypertension   . Hypothyroidism   . Stroke     Ischemic x 2       last one 2007  . Diabetes mellitus     type 11  . Endocrine disturbance     gland has not  worked until 1970.Marland Kitchen..dx in 1977  . Blood transfusion     back in 1973  . GERD (gastroesophageal reflux disease)     on meds to help....meds do well    Past Surgical History:  Past Surgical History  Procedure Date  . Spine surgery     Lumbar Diskectomy  . Joint replacement May 2009    Right knee  . Joint replacement April 2008    Right Hip prothesis  . Carotid endarterectomy Dec. 2009  . Cardiac catheterization     1980  &  2009  . Appendectomy     1960  . Cesarean section   . Back surgery     1990's  . Abdominal hysterectomy 1973    1973  . Fracture surgery     left ankle     PT Assessment/Plan/Recommendation PT Assessment Clinical Impression Statement: Pt s/p AAA repair. Pt very pleasant and eager to mobilize after extubation today. Deferred transfer to chair secondary to pt multiple lines and recently got up with nursing as well. Pt educated for PT plan and will follow to maximize transfers, mobility and independence to decrease burden of care at discharge. PT Recommendation/Assessment: Patient will need skilled PT in the acute care venue PT Problem List: Decreased activity tolerance;Decreased mobility;Decreased knowledge of use of DME Barriers to Discharge: None PT Therapy Diagnosis : Difficulty walking;Acute pain PT Plan PT Frequency: Min 3X/week PT Treatment/Interventions: Gait training;DME instruction;Stair training;Functional mobility training;Therapeutic activities;Therapeutic exercise;Patient/family  education PT Recommendation Recommendations for Other Services: OT consult Follow Up Recommendations: Supervision for mobility/OOB;Home health PT Equipment Recommended: None recommended by PT PT Goals  Acute Rehab PT Goals PT Goal Formulation: With patient Time For Goal Achievement: 2 weeks Pt will go Supine/Side to Sit: with modified independence;with HOB 0 degrees PT Goal: Supine/Side to Sit - Progress: Goal set today Pt will go Sit to Supine/Side: with modified independence;with HOB 0 degrees PT Goal: Sit to Supine/Side - Progress: Goal set today Pt will go Sit to Stand: with supervision PT Goal: Sit to Stand - Progress: Goal set today Pt will go Stand to Sit: with supervision PT Goal: Stand to Sit - Progress: Goal set today Pt will Transfer Bed to Chair/Chair to Bed: with supervision PT Transfer Goal: Bed to Chair/Chair to Bed - Progress: Goal set today Pt will Ambulate: >150 feet;with least restrictive assistive device PT Goal: Ambulate - Progress: Goal set today Pt will Go Up / Down Stairs: 1-2 stairs;with rail(s);with supervision PT Goal: Up/Down Stairs - Progress: Goal set today  PT Evaluation Precautions/Restrictions  Precautions Precautions: Fall Precaution Comments: multiple lines, O2, NG Prior Functioning  Home Living Lives With: Spouse Receives Help From: Family Type of Home: House Home Layout: Two level;Able to live on main level with bedroom/bathroom Home Access: Stairs to enter Entrance Stairs-Rails: Right Entrance Stairs-Number of Steps: 1 Bathroom Shower/Tub: Engineer, manufacturing systems: Standard Home Adaptive Equipment: Walker - rolling;Wheelchair - manual;Tub transfer  bench Additional Comments: Pt states she walks with two ski poles because she doesn't like a walker Prior Function Level of Independence: Requires assistive device for independence;Independent with transfers;Needs assistance with ADLs;Needs assistance with homemaking Bath:  Moderate Dressing: Moderate Meal Prep: Maximal Light Housekeeping: Maximal Driving: No Vocation: Retired Comments: Pt reports vertigo and prior therapy for tx but unable to provide specifics. Pt unable to perform visual tracking today due to loss of target throughout, probably due to pain meds. 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 Pt had been on limited acitivity for one month per MD request and spouse has been assisting with housework and bathing. Cognition Cognition Arousal/Alertness: Awake/alert Overall Cognitive Status: Impaired Orientation Level: Oriented X4 Safety/Judgement: Decreased awareness of safety precautions Decreased Safety/Judgement: Decreased awareness of need for assistance Problem Solving: Requires assistance for problem solving Cognition - Other Comments: cognitive deficits are possibly due to pain medicine Sensation/Coordination Sensation Light Touch: Appears Intact Extremity Assessment RLE Assessment RLE Assessment: Within Functional Limits LLE Assessment LLE Assessment: Within Functional Limits Mobility (including Balance) Bed Mobility Bed Mobility: Yes Rolling Left: 4: Min assist;With rail Rolling Left Details (indicate cue type and reason): cueing to sequence and manage lines Left Sidelying to Sit: 4: Min assist;HOB elevated (comment degrees) (HOB 30 degrees) Left Sidelying to Sit Details (indicate cue type and reason): cueing to sequence and increased time to complete Sitting - Scoot to Edge of Bed: 5: Supervision;With rail Sit to Supine: 4: Min assist;HOB elevated (comment degrees) (HOB 15degrees) Sit to Supine - Details (indicate cue type and reason): assist to bring bilLE into bed. Pt cued for sit to side to decrease ABD pressure, but performed more of a pivot into bed with assist to lower trunk to surface Transfers Transfers: Yes Sit to Stand: 4: Min assist;From elevated surface;From bed Sit to Stand  Details (indicate cue type and reason): cueing for hand placement and assist at trunk to complete anterior translation with bed height elevated. Pt stood 2 min and was able to take a side step to left toward HOB Stand to Sit: 4: Min assist;To bed Stand to Sit Details: assist to control descent and manage lines Ambulation/Gait Ambulation/Gait: No Stairs: No  Posture/Postural Control Posture/Postural Control: No significant limitations Balance Balance Assessed: Yes Static Sitting Balance Static Sitting - Balance Support: Bilateral upper extremity supported;Feet supported Static Sitting - Level of Assistance: 5: Stand by assistance Static Sitting - Comment/# of Minutes: 12 Exercise  General Exercises - Lower Extremity Long Arc Quad: AROM;Both;10 reps;Seated Hip Flexion/Marching: AROM;Both;10 reps;Seated End of Session PT - End of Session Activity Tolerance: Patient tolerated treatment well Patient left: in bed;with call bell in reach Nurse Communication: Mobility status for transfers General Behavior During Session: [facility] for tasks performed Cognition: Impaired (presumably due to pain meds)  Toney Sang Beth 12/10/2011, 4:16 PM  Toney Sang, PT [phone]

## 2011-12-10 NOTE — Anesthesia Postprocedure Evaluation (Signed)
  Anesthesia Post-op Note  Patient: Katie Pace  Procedure(s) Performed: Procedure(s) (LRB): ANEURYSM ABDOMINAL AORTIC REPAIR (N/A) AORTIC ENDARTERECETOMY (N/A)  Patient Location: SICU  Anesthesia Type: General  Level of Consciousness: unresponsive  Airway and Oxygen Therapy: Patient remains intubated per anesthesia plan  Post-op Pain: none  Post-op Assessment: Post-op Vital signs reviewed, Patient's Cardiovascular Status Stable and Respiratory Function Stable  Post-op Vital Signs: Reviewed and stable  Complications: No apparent anesthesia complications

## 2011-12-10 NOTE — Progress Notes (Signed)
Name: Katie Pace MRN: [id number] DOB: December 02, 1935    LOS: 1  PCCM CONSULT NOTE  History of Present Illness: 76yo female with hx HTN, DM, ?chronic renal insufficiency who presented 3/6 for planned AAA by Dr. Hart Pace.  Surgery was lengthy with mod amt blood loss and blood products given.  Pt remained intubated post op in ICU and PCCM consulted for vent management.   Lines / Drains: ETT 3/6>>>3/7 R IJ PAC 3/6>>> L rad aline 3/6>>>  Cultures: none  Antibiotics: Vanc (periop) 3/6>>  Tests / Events: No acute events overnight, breathing comfortably on PSV 5/5 this morning; no secretions   Vital Signs: Temp:  [95.9 F (35.5 C)-100.6 F (38.1 C)] 98.2 F (36.8 C) (03/07 0726) Pulse Rate:  [51-78] 78  (03/07 0726) Resp:  [10-22] 10  (03/07 0726) BP: (92-175)/(28-72) 119/49 mmHg (03/07 0726) SpO2:  [93 %-100 %] 98 % (03/07 0726) Arterial Line BP: (94-180)/(40-79) 132/51 mmHg (03/07 0700) FiO2 (%):  [40 %-50.6 %] 40 % (03/07 0726) Weight:  [70 kg (154 lb 5.2 oz)-75.07 kg (165 lb 8 oz)] 75.07 kg (165 lb 8 oz) (03/07 0500) I/O last 3 completed shifts: In: 11939.5 [I.V.:6082.5; OZHYQ:6578; NG/GT:120; IV Piggyback:900] Out: 6280 [Urine:1380; Emesis/NG output:200; Blood:4700] Physical Examination:  General: awake on vent, weak cough, can pick up head HEENT: mm moist, ETT Neuro: awake on vent Lungs: CTA B Cards: s1s2 rrr, no m/r/g Abd: soft, round, non tender, +bs, midline incision. Ext: warm and dry, no edema   Ventilator settings: Vent Mode:  [-] CPAP FiO2 (%):  [40 %-50.6 %] 40 % Set Rate:  [12 bmp] 12 bmp Vt Set:  [440 mL-640 mL] 440 mL PEEP:  [4.4 cmH20-5 cmH20] 5 cmH20 Pressure Support:  [5 cmH20-10 cmH20] 5 cmH20 Plateau Pressure:  [14 cmH20-20 cmH20] 19 cmH20  PRVC 12, Tv 440, PEEP 5 and FiO2 40%,  7.42/37/133 SaO2 100% on PSV this morning  Labs and Imaging:   CBC    Component Value Date/Time   WBC 10.6* 12/10/2011 0425   RBC 2.77* 12/10/2011 0425   HGB 8.4*  12/10/2011 0425   HCT 25.1* 12/10/2011 0425   PLT 80* 12/10/2011 0425   MCV 90.6 12/10/2011 0425   MCH 30.3 12/10/2011 0425   MCHC 33.5 12/10/2011 0425   RDW 16.5* 12/10/2011 0425   LYMPHSABS 1.8 12/01/2011 1108   MONOABS 1.2* 12/01/2011 1108   EOSABS 0.1 12/01/2011 1108   BASOSABS 0.0 12/01/2011 1108   BMET    Component Value Date/Time   NA 140 12/10/2011 0425   K 4.2 12/10/2011 0425   CL 112 12/10/2011 0425   CO2 24 12/10/2011 0425   GLUCOSE 87 12/10/2011 0425   BUN 23 12/10/2011 0425   CREATININE 1.26* 12/10/2011 0425   CREATININE 2.17* 11/11/2011 1018   CALCIUM 6.5* 12/10/2011 0425   GFRNONAA 41* 12/10/2011 0425   GFRAA 48* 12/10/2011 0425   ABG    Component Value Date/Time   PHART 7.420* 12/09/2011 1616   PCO2ART 37.2 12/09/2011 1616   PO2ART 133.0* 12/09/2011 1616   HCO3 24.2* 12/09/2011 1616   TCO2 25 12/09/2011 1616   ACIDBASEDEF 3.0* 12/09/2011 1233   O2SAT 99.0 12/09/2011 1616   Mr Brain Wo Contrast  12/08/2011  *RADIOLOGY REPORT*  Clinical Data: Syncope and collapse.  MRI HEAD WITHOUT CONTRAST  Technique:  Multiplanar, multiecho pulse sequences of the brain and surrounding structures were obtained according to standard protocol without intravenous contrast.  Comparison: MRI 11/04/2010  Findings: Intravenous contrast not  given due to GFR 37.  Negative for acute infarct.  Extensive chronic ischemic change is present.  Chronic infarcts are present throughout the white matter bilaterally.  Chronic left occipital infarct is unchanged.  Chronic ischemia is present in the pons bilaterally and in the cerebellum bilaterally also unchanged.  Chronic area of hemorrhage in the right lower parietal lobe is unchanged from the  prior study. Negative for mass or fluid collection.  Paranasal sinuses are clear.  IMPRESSION: Atrophy and extensive chronic ischemic change.  Chronic hemorrhage right parietal lobe.  No acute abnormality and no change from the MRI of 11/04/2010  Original Report Authenticated By: Katie Pace, M.D.   Dg  Chest Portable 1 View  12/09/2011  *RADIOLOGY REPORT*  Clinical Data: Postoperative study.  Patient on a ventilator.  PORTABLE CHEST - 1 VIEW  Comparison: Chest x-ray 12/01/2011.  Findings: The patient is intubated, with tip of endotracheal tube at the level of the thoracic inlet.  There is been interval placement of a right internal jugular Cordis, through which a Theone Murdoch catheter has been floated into the right descending pulmonary artery.  Nasogastric tube is seen extending into the stomach (tip extends below the lower margin of the image).  Lung volumes are low.  There are opacities in the left lower lobe, favored to represent areas of subsegmental atelectasis.  No definite focal airspace consolidation.  Pulmonary vasculature is normal.  Heart size is normal.  Atherosclerotic calcifications in the arch of the aorta.  IMPRESSION: 1.  Support apparatus, as above. 2.  Low lung volumes with probable subsegmental atelectasis in the left lower lobe. 3.  Atherosclerosis.  Original Report Authenticated By: Katie Pace, M.D.   Assessment and Plan:  76 y/o female s/p op AAA repair per vascular surgery. Significant blood loss ( ) with multiple blood products given.  No known underlying pulmonary issues.    1. Acute vent dependent respiratory failure -  PLAN -  Extubated this morning Incentive spirometry Pulmonary toilette Splinting technique reviewed for cough Headed, humidified oxygen   2. Anemia - Hgb down this AM -transfusion and serial Hct per Vascular Surgery  3. History of Katie Pace syndrome/pan hypopit -synthroid restarted per vascular -I will start hydrocortisone (50mg  IV bid today) and HGH (home dose) -can wean HC to home dose on 3/8  CC time 35 minutes.  Katie Pace PCCM Pager: [phone] If no response, call [phone]

## 2011-12-10 NOTE — Progress Notes (Addendum)
[facility] [place]  Post-op  Intra-abdominal Surgery note  Date of Surgery: 12/09/2011 Surgeon: Surgeon(s): Josephina Gip, MD Nilda Simmer, MD POD: 1 Day Post-Op Procedure(s): ANEURYSM ABDOMINAL AORTIC REPAIR AORTIC ENDARTERECETOMY  History of Present Illness  Katie Pace is a 76 y.o. female who is  up s/p Procedure(s): ANEURYSM ABDOMINAL AORTIC REPAIR AORTIC ENDARTERECETOMY Pt is doing well. denies incisional pain; denies nausea/vomiting; denies diarrhea. has not had flatus;has not had BM  IMAGING: Mr Brain Wo Contrast  12/08/2011  *RADIOLOGY REPORT*  Clinical Data: Syncope and collapse.  MRI HEAD WITHOUT CONTRAST  Technique:  Multiplanar, multiecho pulse sequences of the brain and surrounding structures were obtained according to standard protocol without intravenous contrast.  Comparison: MRI 11/04/2010  Findings: Intravenous contrast not given due to GFR 37.  Negative for acute infarct.  Extensive chronic ischemic change is present.  Chronic infarcts are present throughout the white matter bilaterally.  Chronic left occipital infarct is unchanged.  Chronic ischemia is present in the pons bilaterally and in the cerebellum bilaterally also unchanged.  Chronic area of hemorrhage in the right lower parietal lobe is unchanged from the  prior study. Negative for mass or fluid collection.  Paranasal sinuses are clear.  IMPRESSION: Atrophy and extensive chronic ischemic change.  Chronic hemorrhage right parietal lobe.  No acute abnormality and no change from the MRI of 11/04/2010  Original Report Authenticated By: Camelia Phenes, M.D.   Dg Chest Portable 1 View  12/10/2011  *RADIOLOGY REPORT*  Clinical Data: Ventilator dependent respiratory failure.  PORTABLE CHEST - 1 VIEW 12/10/2011 0722 hours:  Comparison: Portable chest x-ray yesterday and two-view chest x-ray 12/01/2011 [facility], two-view chest x-ray 09/15/2010 [facility].  Findings:  Endotracheal tube tip in satisfactory position approximately 3 cm above the carina.  Swan-Ganz catheter tip in the right main pulmonary artery.  Nasogastric courses below the diaphragm into the stomach.  Suboptimal inspiration with atelectasis in the lung bases, left greater than right.  Small left pleural effusion.  Cardiac silhouette mildly enlarged for the technique and degree of inspiration, unchanged.  Pulmonary vascularity normal without evidence of pulmonary edema.  Surgical skin staples in the right upper quadrant of the abdomen.  IMPRESSION: Support apparatus satisfactory.  Suboptimal inspiration accounts for bibasilar atelectasis, left greater than right.  Small left pleural effusion.  Mild cardiomegaly without pulmonary edema.  Original Report Authenticated By: Arnell Sieving, M.D.   Dg Chest Portable 1 View  12/09/2011  *RADIOLOGY REPORT*  Clinical Data: Postoperative study.  Patient on a ventilator.  PORTABLE CHEST - 1 VIEW  Comparison: Chest x-ray 12/01/2011.  Findings: The patient is intubated, with tip of endotracheal tube at the level of the thoracic inlet.  There is been interval placement of a right internal jugular Cordis, through which a Theone Murdoch catheter has been floated into the right descending pulmonary artery.  Nasogastric tube is seen extending into the stomach (tip extends below the lower margin of the image).  Lung volumes are low.  There are opacities in the left lower lobe, favored to represent areas of subsegmental atelectasis.  No definite focal airspace consolidation.  Pulmonary vasculature is normal.  Heart size is normal.  Atherosclerotic calcifications in the arch of the aorta.  IMPRESSION: 1.  Support apparatus, as above. 2.  Low lung volumes with probable subsegmental atelectasis in the left lower lobe. 3.  Atherosclerosis.  Original Report Authenticated By: Florencia Reasons, M.D.    Significant Diagnostic Studies:  CBC Lab Results  Component Value Date   WBC  10.6* 12/10/2011   HGB 8.4* 12/10/2011   HCT 25.1* 12/10/2011   MCV 90.6 12/10/2011   PLT 80* 12/10/2011    BMET    Component Value Date/Time   NA 140 12/10/2011 0425   K 4.2 12/10/2011 0425   CL 112 12/10/2011 0425   CO2 24 12/10/2011 0425   GLUCOSE 87 12/10/2011 0425   BUN 23 12/10/2011 0425   CREATININE 1.26* 12/10/2011 0425   CREATININE 2.17* 11/11/2011 1018   CALCIUM 6.5* 12/10/2011 0425   GFRNONAA 41* 12/10/2011 0425   GFRAA 48* 12/10/2011 0425   COAG Lab Results  Component Value Date   INR 1.66* 12/10/2011   INR 1.43 12/09/2011   INR 1.01 12/01/2011   No results found for this basename: PTT    I/O last 3 completed shifts: In: 11939.5 [I.V.:6082.5; EHUDJ:4970; NG/GT:120; IV Piggyback:900] Out: 6280 [Urine:1380; Emesis/NG output:200; Blood:4700] Total I/O In: 405 [I.V.:125; Blood:50; NG/GT:30; IV Piggyback:200] Out: 80 [Urine:80]  Physical Examination BP Readings from Last 3 Encounters:  12/10/11 115/41  12/10/11 115/41  12/01/11 127/73   Temp Readings from Last 3 Encounters:  12/10/11 98.2 F (36.8 C)   12/10/11 98.2 F (36.8 C)   12/01/11 98.6 F (37 C)    SpO2 Readings from Last 3 Encounters:  12/10/11 100%  12/10/11 100%  12/01/11 96%   Pulse Readings from Last 3 Encounters:  12/10/11 82  12/10/11 82  12/01/11 72    General: A&O x 3, WDWN female in NAD Pulmonary: normal non-labored breathing , without Rales, rhonchi,  wheezing Cardiac: Heart rate : regular ,  Abdomen:abdomen soft Abdominal wound:clean, dry, intact  Neurologic: A&O X 3; Appropriate Affect ; SENSATION: normal; MOTOR FUNCTION:  moving all extremities equally. Speech is fluent/normal  Vascular Exam:BLE warm and well perfused Extremities without ischemic changes, no Gangrene , no cellulitis; no open wounds;   LOWER EXTREMITY PULSES           RIGHT                                      LEFT      POSTERIOR TIBIAL present 2+ and doppler present 1+ and doppler       DORSALIS PEDIS      ANTERIOR TIBIAL present 2+  and doppler present 1+ and doppler       PERONEAL present 2+ and palpable present 1+, doppler and palpable       Assessment: Katie Pace is a 76 y.o. female who is 1 Day Post-Op Procedure(s): ANEURYSM ABDOMINAL AORTIC REPAIR AORTIC ENDARTERECETOMY Hgl 8.4  Plan:   Transfusion 2 units today, PT/PTT/INR after transfusion. Extubation was earlier this am. Pending NG tube out put will pos. D/C tomorrow.  Clinton Gallant [facility] [phone] 12/10/2011 9:46 AM      Doing well one day post abdominal aortic aneurysm resection with aortobifemoral iliac graft. Patient extubated this a.m. Chest x-ray looks good. Urine output 30-40 cc per hour. Creatinine stable. Hematocrit decreased to 25%-we'll give 2 units of packed red blood cells and check clotting studies. Platelet count 80,000. NG output 200 cc last shift On examination patient has 3+ femoral 2+ popliteal pulses palpable bilaterally. Both feet well perfused with positive Doppler flow and PTs.  Plan pulmonary toilet and began mobilization Keep nasogastric tube today and probably DC in a.m. Generally doing well

## 2011-12-10 NOTE — Op Note (Signed)
Katie Pace, Katie Pace              ACCOUNT NO.:  [id number]  MEDICAL RECORD NO.:  [id number]  LOCATION:  2313                         FACILITY:  [facility]  PHYSICIAN:  Quita Skye. Hart Rochester, M.D.  DATE OF BIRTH:  03/29/1936  DATE OF PROCEDURE:  12/09/2011 DATE OF DISCHARGE:                              OPERATIVE REPORT   PREOPERATIVE DIAGNOSIS:  Large infrarenal abdominal aortic aneurysm with occlusive disease.  POSTOPERATIVE DIAGNOSIS:  Large infrarenal abdominal aortic aneurysm with occlusive disease.  OPERATIONS: 1. Resection and grafting of infrarenal abdominal aortic aneurysm with     insertion of an aorto-bi-external iliac graft using a 16 x 8-mm     Hemashield Dacron graft. 2. Re-exploration of proximal anastomosis with proximal aortic     endarterectomy. 3. Reimplantation of inferior mesenteric artery into aortic graft.  SURGEON:  Quita Skye. Hart Rochester, MD  FIRST ASSISTANT:  Della Goo, PA-C  ANESTHESIA:  General endotracheal.  PROCEDURE:  The patient was taken to the operating room and placed in the supine position, at which time, satisfactory general endotracheal anesthesia was administered.  Abdomen and groins were prepped with Betadine scrub and solution, draped in a routine sterile manner.  After anesthesia, inserted a Swan-Ganz catheter and a radial arterial line. Midline incision was made in the abdomen from xiphoid to pubis, carried down through the subcutaneous tissue and platysma using the Bovie. Peroneal cavity was entered and thoroughly explored.  The stomach, duodenum, small bowel and colon were unremarkable.  Liver was smooth with no palpable masses.  Gallbladder appeared normal, no stones were palpable.  Transverse colon was inflicted cephalad and intestines reflected to the right side, retroperitoneum incised exposing a large aortic aneurysm.  The left renal vein was mobilized.  The adrenal branch was ligated with 2-0 silk ties and divided.  The neck of the  aneurysm extended straight to the patient's left side and was heavily calcified, was encircled with an umbilical tape.  Both common iliac arteries were diffusely calcified, encircled with umbilical tapes.  Both external iliac arteries were exposed and were much softer better vessels.  On the left, this was done in the pelvis distal to the sigmoid colon and on the right.  It was done through the same longitudinal retroperitoneal incision.  The patient was given 25 g of mannitol and heparinized. Aorta was occluded distal to the renal arteries.  The aorta was severely diseased with calcific plaque making proximal occlusion difficult.  Both common iliac arteries were ligated with umbilical tapes.  Aneurysm was opened longitudinally, laminated thrombus was removed.  Lumbar branches were oversewn with 2-0 silk figure-of-eight sutures.  Neck of the aneurysm was transected about 3-4 cm distal to the renal arteries.  The proximal occlusion was a problem because of the heavily calcified plaque and therefore, two clamps were placed to get satisfactory inflow occlusion.  Partial endarterectomy was initially performed, being concerned about not bleeding too thin of an aorta.  A 16 x 8 mm Hemashield Dacron graft was anastomosed end-to-end to the aortic stump buttressing this with a strip of felt.  Few leaks were present and were repaired with 3-0 Prolene pledgeted mattress sutures.  Following this, a 20-mm cuff was placed over  the aorta for external support.  The left limb was then delivered into the pelvic area where the external iliac artery had been exposed, longitudinal opening made in the external iliac with 15-blade, extended with Potts scissors.  End-to-side anastomosis was done with 5-0 Prolene.  Following this, appropriate flushing was performed, but it was noted the inflow was very poor.  Fogarty catheter was passed up the right limb and it would have some difficulty traversing the infrarenal  aortic stump.  This was where the aorta was known to be heavily calcified and diseased.  Therefore, the left anastomosis was completed and the right limb was also anastomosed to the external iliac artery with 5-0 Prolene in a similar fashion.  Following completion of this, the inflow was obviously not satisfactory. Therefore, the aortic graft was clamped distally and the proximal aorta was exposed proximal to the left renal artery and distal to the right renal artery where it was a clamp occluded with another vascular clamp. The aortic graft was almost completely transected about 3 cm distal to the previous anastomosis where I could inspect the inside of the aorta. There was a lot of calcific plaque, which had dissected somewhat in this area and localized endarterectomy was necessary to get adequate lumen. This was carefully examined and again because of the calcific disease, it was difficult to get total occlusion proximally.  When excellent inflow was present, the graft was reapproximated with continuous 3-0 Prolene.  Appropriate flushing was performed, clamps were released, and there was an excellent pulse in both limbs and good Doppler flow.  There was very sluggish backbleeding coming from the inferior mesenteric artery, which had been patent in the aneurysm.  Therefore, it was reattached to the body of the aortic graft on the left anterolateral aspect using partial occlusion clamp, opening it with an 11-blade enlarging with 4-mm punch.  The IMA was spatulated, anastomosed end-to- side with 6-0 Prolene.  Clamps were then released.  There was an excellent Doppler flow in both renal, the IMA and both external iliacs. Protamine was given to reverse the heparin as well as 2 units of fresh frozen plasma and 10 packs of platelets.  The patient had 6 units of packed red blood cells transfuse as well as 1500 mL of Cell Saver during the procedure, most of this blood loss because of difficulty  getting proximal occlusion with the clamps.  When adequate hemostasis was present following administration of 100 mg protamine, aneurysm was closed over the graft with 3-0 Vicryl, retroperitoneal wounds all closed with 3-0 Vicryl, thorough irrigation was performed, linea alba was closed with #1 Prolene and the skin with clips.  Sterile dressing was applied.  The patient was taken to surgical intensive care unit in stable condition on the ventilator, had excellent urinary output and was stable hemodynamically through most of the case except for a few episodes of blood pressures drifting into the 70-80 systolic range, which responded to volume administration.     Quita Skye Hart Rochester, M.D.     JDL/MEDQ  D:  12/09/2011  T:  12/10/2011  Job:  [id number]    First Asst. on surgical procedure-Dr. Leonides Sake

## 2011-12-11 ENCOUNTER — Encounter (HOSPITAL_COMMUNITY): Payer: Self-pay | Admitting: Vascular Surgery

## 2011-12-11 DIAGNOSIS — J9600 Acute respiratory failure, unspecified whether with hypoxia or hypercapnia: Secondary | ICD-10-CM | POA: Diagnosis not present

## 2011-12-11 DIAGNOSIS — I714 Abdominal aortic aneurysm, without rupture: Secondary | ICD-10-CM | POA: Diagnosis not present

## 2011-12-11 DIAGNOSIS — E230 Hypopituitarism: Secondary | ICD-10-CM | POA: Diagnosis not present

## 2011-12-11 LAB — POCT I-STAT 3, ART BLOOD GAS (G3+)
Acid-base deficit: 1 mmol/L (ref 0.0–2.0)
Bicarbonate: 23.9 mEq/L (ref 20.0–24.0)
Patient temperature: 99.1
TCO2: 25 mmol/L (ref 0–100)
pH, Arterial: 7.387 (ref 7.350–7.400)

## 2011-12-11 LAB — CBC
HCT: 29.5 % — ABNORMAL LOW (ref 36.0–46.0)
Hemoglobin: 9.9 g/dL — ABNORMAL LOW (ref 12.0–15.0)
MCH: 29.8 pg (ref 26.0–34.0)
MCHC: 33.6 g/dL (ref 30.0–36.0)
MCV: 88.9 fL (ref 78.0–100.0)
RBC: 3.32 MIL/uL — ABNORMAL LOW (ref 3.87–5.11)

## 2011-12-11 LAB — PREPARE FRESH FROZEN PLASMA: Unit division: 0

## 2011-12-11 LAB — GLUCOSE, CAPILLARY: Glucose-Capillary: 92 mg/dL (ref 70–99)

## 2011-12-11 LAB — BASIC METABOLIC PANEL
BUN: 22 mg/dL (ref 6–23)
Chloride: 111 mEq/L (ref 96–112)
Glucose, Bld: 82 mg/dL (ref 70–99)
Potassium: 4 mEq/L (ref 3.5–5.1)
Sodium: 144 mEq/L (ref 135–145)

## 2011-12-11 LAB — PROTIME-INR: INR: 1.7 — ABNORMAL HIGH (ref 0.00–1.49)

## 2011-12-11 MED ORDER — AMIODARONE HCL 200 MG PO TABS
400.0000 mg | ORAL_TABLET | Freq: Two times a day (BID) | ORAL | Status: DC
  Filled 2011-12-11 (×2): qty 2

## 2011-12-11 MED ORDER — AMIODARONE IV BOLUS ONLY 150 MG/100ML: 150.0000 mg | Freq: Once | INTRAVENOUS | Status: DC

## 2011-12-11 MED ORDER — DEXTROSE 50 % IV SOLN
INTRAVENOUS | Status: AC
  Filled 2011-12-11: qty 50

## 2011-12-11 MED ORDER — DEXTROSE-NACL 5-0.9 % IV SOLN
INTRAVENOUS | Status: DC
  Administered 2011-12-11 – 2011-12-13 (×3): via INTRAVENOUS

## 2011-12-11 MED ORDER — HYDROCORTISONE SOD SUCCINATE 100 MG IJ SOLR
50.0000 mg | INTRAMUSCULAR | Status: DC
  Administered 2011-12-12 – 2011-12-13 (×2): 50 mg via INTRAVENOUS
  Filled 2011-12-11 (×3): qty 1

## 2011-12-11 NOTE — Progress Notes (Signed)
Physical Therapy Treatment Patient Details Name: Katie Pace MRN: [id number] DOB: [birth year] Today's Date: 12/11/2011  PT Assessment/Plan  PT - Assessment/Plan Comments on Treatment Session: Pt s/p AAA repair progressing with mobility but currently limited due to mental status presumed due to recent morphine administration. Pt encouraged to mobilize with RN and continue HEP, family present throughout tx. PT Plan: Discharge plan remains appropriate;Frequency remains appropriate PT Goals  Acute Rehab PT Goals PT Goal: Sit to Stand - Progress: Progressing toward goal PT Goal: Stand to Sit - Progress: Progressing toward goal PT Transfer Goal: Bed to Chair/Chair to Bed - Progress: Progressing toward goal PT Goal: Ambulate - Progress: Progressing toward goal PT Goal: Up/Down Stairs - Progress: Progressing toward goal  PT Treatment Precautions/Restrictions  Precautions Precautions: Fall Precaution Comments: NG. lines Restrictions Weight Bearing Restrictions: No Mobility (including Balance) Bed Mobility Bed Mobility: No Transfers Sit to Stand: 3: Mod assist;From chair/3-in-1;With armrests Sit to Stand Details (indicate cue type and reason): cues for sequence and assist to complete anterior translation Stand to Sit: 5: Supervision;To chair/3-in-1;With armrests Stand to Sit Details: cueing to position at surface and VC for hand placement for safety Ambulation/Gait Ambulation/Gait: Yes Ambulation/Gait Assistance: 4: Min assist Ambulation/Gait Assistance Details (indicate cue type and reason): minguard with VC to step into RW and assist to direct RW, dgtr-in-law pushed chair for safety Ambulation Distance (Feet): 40 Feet Assistive device: Rolling walker Gait Pattern: Decreased stride length Gait velocity: slowly  Posture/Postural Control Posture/Postural Control: No significant limitations Exercise  General Exercises - Lower Extremity Long Arc Quad: AROM;Both;20 reps;Seated Hip  Flexion/Marching: AROM;Both;Other reps (comment);Seated (20reps) End of Session PT - End of Session Equipment Utilized During Treatment: Gait belt Activity Tolerance: Patient tolerated treatment well Patient left: in chair;with call bell in reach;with family/visitor present Nurse Communication: Mobility status for transfers;Mobility status for ambulation General Behavior During Session: Flat affect Cognition: Impaired (slow to respond and repetitive in responses, potentially medicine related)  Delorse Lek 12/11/2011, 9:46 AM  Toney Sang, PT [phone]

## 2011-12-11 NOTE — Progress Notes (Signed)
[facility] [place]  Post-op  Intra-abdominal Surgery note  Date of Surgery: 12/09/2011 Surgeon: Surgeon(s): Josephina Gip, MD Nilda Simmer, MD POD: 2 Days Post-Op Procedure(s): ANEURYSM ABDOMINAL AORTIC REPAIR AORTIC ENDARTERECETOMY  History of Present Illness  Katie Pace is a 76 y.o. female who is  up s/p Procedure(s): ANEURYSM ABDOMINAL AORTIC REPAIR AORTIC ENDARTERECETOMY Pt is doing well. denies incisional pain; denies nausea/vomiting; denies diarrhea. has not had flatus;has not had BM     Significant Diagnostic Studies: CBC Lab Results  Component Value Date   WBC 12.5* 12/10/2011   HGB 10.9* 12/10/2011   HCT 32.4* 12/10/2011   MCV 87.3 12/10/2011   PLT 79* 12/10/2011    BMET    Component Value Date/Time   NA 140 12/10/2011 0425   K 4.2 12/10/2011 0425   CL 112 12/10/2011 0425   CO2 24 12/10/2011 0425   GLUCOSE 87 12/10/2011 0425   BUN 23 12/10/2011 0425   CREATININE 1.26* 12/10/2011 0425   CREATININE 2.17* 11/11/2011 1018   CALCIUM 6.5* 12/10/2011 0425   GFRNONAA 41* 12/10/2011 0425   GFRAA 48* 12/10/2011 0425   COAG Lab Results  Component Value Date   INR 1.70* 12/11/2011   INR 1.48 12/10/2011   INR 1.66* 12/10/2011   No results found for this basename: PTT    I/O last 3 completed shifts: In: 5795.6 [I.V.:3520.6; Blood:1355; NG/GT:270; IV Piggyback:650] Out: 4640 [Urine:4040; Emesis/NG output:600]    Physical Examination BP Readings from Last 3 Encounters:  12/11/11 144/53  12/11/11 144/53  12/01/11 127/73   Temp Readings from Last 3 Encounters:  12/11/11 99.1 F (37.3 C)   12/11/11 99.1 F (37.3 C)   12/01/11 98.6 F (37 C)    SpO2 Readings from Last 3 Encounters:  12/11/11 99%  12/11/11 99%  12/01/11 96%   Pulse Readings from Last 3 Encounters:  12/11/11 83  12/11/11 83  12/01/11 72    General: A&O x 3, WDWN female in NAD Pulmonary: normal non-labored breathing , without Rales, rhonchi,  wheezing Cardiac: Heart rate :  regular ,  Abdomen:abdomen soft Abdominal wound:clean, dry, intact  Neurologic: A&O X 3; Appropriate Affect ; SENSATION: normal; MOTOR FUNCTION:  moving all extremities equally. Speech is fluent/normal  Vascular Exam:BLE warm and well perfused Extremities without ischemic changes, no Gangrene , no cellulitis; no open wounds;   LOWER EXTREMITY PULSES           RIGHT                                      LEFT      POSTERIOR TIBIAL present 1+ and doppler present 1+ and doppler       DORSALIS PEDIS      ANTERIOR TIBIAL present 1+ and doppler present 1+, doppler and palpable       PERONEAL present 1+ and palpable present 1+, doppler and palpable    I/O last 3 completed shifts: In: 5795.6 [I.V.:3520.6; Blood:1355; NG/GT:270; IV Piggyback:650] Out: 4640 [Urine:4040; Emesis/NG output:600]    Assessment: Katie Pace is a 76 y.o. female who is 2 Days Post-Op Procedure(s): ANEURYSM ABDOMINAL AORTIC REPAIR AORTIC ENDARTERECETOMY  Plan: Continue current treat will maintain NG D/C [name] [facility] [phone] 12/11/2011 7:19 AM

## 2011-12-11 NOTE — Progress Notes (Signed)
Attempted to see pt. This pm; however, pt. Receiving bath.  Unable to re-attempt this afternoon.  Will continue attempts at eval.  Jeani Hawking, OTR/L [phone]

## 2011-12-11 NOTE — Progress Notes (Signed)
Utilization review completed. Julianah Marciel, RN, BSN.  12/11/11  

## 2011-12-11 NOTE — Progress Notes (Signed)
Patient ID: Katie Pace, female   DOB: 1936-08-20, 76 y.o.   MRN: [id number] Vascular Surgery Progress Note  Subjective: 2 days post resection and grafting of infrarenal abdominal aortic aneurysm with proximal aortic endarterectomy and reimplantation of inferior mesenteric artery. Patient doing well today but complaining of significant back discomfort due degenerative disc disease which is a chronic problem. No nausea or vomiting. No pain in lower extremities states she is breathing satisfactorily. No severe abdominal pain.  Objective:  Filed Vitals:   12/11/11 1000  BP: 132/95  Pulse: 87  Temp:   Resp: 16    General alert and oriented x3 in no apparent distress Abdomen fairly soft mildly tender Nasogastric tube in place 3+ femoral and popliteal pulses palpable bilaterally with well-perfused lower extremities Lungs clear no rhonchi or wheezing    Labs:  Lab 12/11/11 0820 12/10/11 0425 12/09/11 1430  CREATININE 1.15* 1.26* 1.15*    Lab 12/11/11 0820 12/10/11 0425 12/09/11 1430  NA 144 140 139  K 4.0 4.2 3.7  CL 111 112 107  CO2 26 24 24   BUN 22 23 21   CREATININE 1.15* 1.26* 1.15*  LABGLOM -- -- --  GLUCOSE 82 -- --  CALCIUM 7.2* 6.5* 8.6    Lab 12/10/11 1634 12/10/11 0425 12/09/11 1425  WBC 12.5* 10.6* 8.1  HGB 10.9* 8.4* 11.1*  HCT 32.4* 25.1* 32.3*  PLT 79* 80* 128*    Lab 12/11/11 0400 12/10/11 1634 12/10/11 0800  INR 1.70* 1.48 1.66*    I/O last 3 completed shifts: In: 5795.6 [I.V.:3520.6; Blood:1355; NG/GT:270; IV Piggyback:650] Out: 4640 [Urine:4040; Emesis/NG output:600]  Imaging: Dg Chest Portable 1 View  12/10/2011  *RADIOLOGY REPORT*  Clinical Data: Ventilator dependent respiratory failure.  PORTABLE CHEST - 1 VIEW 12/10/2011 0722 hours:  Comparison: Portable chest x-ray yesterday and two-view chest x-ray 12/01/2011 [facility], two-view chest x-ray 09/15/2010 [facility].  Findings: Endotracheal tube tip in satisfactory position  approximately 3 cm above the carina.  Swan-Ganz catheter tip in the right main pulmonary artery.  Nasogastric courses below the diaphragm into the stomach.  Suboptimal inspiration with atelectasis in the lung bases, left greater than right.  Small left pleural effusion.  Cardiac silhouette mildly enlarged for the technique and degree of inspiration, unchanged.  Pulmonary vascularity normal without evidence of pulmonary edema.  Surgical skin staples in the right upper quadrant of the abdomen.  IMPRESSION: Support apparatus satisfactory.  Suboptimal inspiration accounts for bibasilar atelectasis, left greater than right.  Small left pleural effusion.  Mild cardiomegaly without pulmonary edema.  Original Report Authenticated By: Arnell Sieving, M.D.   Dg Chest Portable 1 View  12/09/2011  *RADIOLOGY REPORT*  Clinical Data: Postoperative study.  Patient on a ventilator.  PORTABLE CHEST - 1 VIEW  Comparison: Chest x-ray 12/01/2011.  Findings: The patient is intubated, with tip of endotracheal tube at the level of the thoracic inlet.  There is been interval placement of a right internal jugular Cordis, through which a Theone Murdoch catheter has been floated into the right descending pulmonary artery.  Nasogastric tube is seen extending into the stomach (tip extends below the lower margin of the image).  Lung volumes are low.  There are opacities in the left lower lobe, favored to represent areas of subsegmental atelectasis.  No definite focal airspace consolidation.  Pulmonary vasculature is normal.  Heart size is normal.  Atherosclerotic calcifications in the arch of the aorta.  IMPRESSION: 1.  Support apparatus, as above. 2.  Low lung  volumes with probable subsegmental atelectasis in the left lower lobe. 3.  Atherosclerosis.  Original Report Authenticated By: Florencia Reasons, M.D.    Assessment/Plan:  POD #2  LOS: 2 days  s/p Procedure(s): ANEURYSM ABDOMINAL AORTIC REPAIR with aortobifemoral iliac graft  AORTIC ENDARTERECETOMY and reimplantation inferior mesenteric artery  Doing well with back discomfort due to degenerative disc disease Hematocrit stable at 32%. Platelets stable at 80,000. INR 1.70. Creatinine stable at 1.15 with adequate urinary output   Plan #1 DC nasogastric tube #2 continue mobilization with ambulation with help #3 remain strictly n.p.o. except for occasional ice chips #4 likely will keep in surgical intensive care unit overnight 48 hours to help with ambulation and mobilization   Josephina Gip, MD 12/11/2011 10:18 AM

## 2011-12-11 NOTE — Progress Notes (Signed)
Name: Katie Pace MRN: [id number] DOB: 05/07/1936    LOS: 2  PCCM FU NOTE  History of Present Illness: 75yo female with hx HTN, DM, ?chronic renal insufficiency who presented 3/6 for planned AAA by Dr. Hart Rochester.  Surgery was lengthy with mod amt blood loss and blood products given.  Pt remained intubated post op in ICU and PCCM consulted for vent management.   Lines / Drains: ETT 3/6>>>3/7 R IJ PAC 3/6>>>3/7 L rad aline 3/6>>>  Cultures: none  Antibiotics: Vanc (periop) 3/6>>  Tests / Events: No acute events overnight, c/o pain, oob to chair, good UO overnight, Mg repleted   Vital Signs: Temp:  [97.4 F (36.3 C)-99.7 F (37.6 C)] 97.8 F (36.6 C) (03/08 0752) Pulse Rate:  [77-99] 83  (03/08 0700) Resp:  [11-27] 15  (03/08 0700) BP: (115-182)/(39-75) 144/53 mmHg (03/08 0700) SpO2:  [94 %-100 %] 99 % (03/08 0700) Arterial Line BP: (109-193)/(46-80) 166/67 mmHg (03/08 0700) Weight:  [74.9 kg (165 lb 2 oz)] 74.9 kg (165 lb 2 oz) (03/08 0500) I/O last 3 completed shifts: In: 5795.6 [I.V.:3520.6; Blood:1355; NG/GT:270; IV Piggyback:650] Out: 4640 [Urine:4040; Emesis/NG output:600] Physical Examination:  General: awake , good cough HEENT: mm moist, ETT Neuro: awake, interactive Lungs: CTA B Cards: s1s2 rrr, no m/r/g Abd: soft, round, non tender, +bs, midline incision. Ext: warm and dry, no edema   Ventilator settings: off  Labs and Imaging:   CBC    Component Value Date/Time   WBC 12.5* 12/10/2011 1634   RBC 3.71* 12/10/2011 1634   HGB 10.9* 12/10/2011 1634   HCT 32.4* 12/10/2011 1634   PLT 79* 12/10/2011 1634   MCV 87.3 12/10/2011 1634   MCH 29.4 12/10/2011 1634   MCHC 33.6 12/10/2011 1634   RDW 17.3* 12/10/2011 1634   LYMPHSABS 1.8 12/01/2011 1108   MONOABS 1.2* 12/01/2011 1108   EOSABS 0.1 12/01/2011 1108   BASOSABS 0.0 12/01/2011 1108   BMET    Component Value Date/Time   NA 140 12/10/2011 0425   K 4.2 12/10/2011 0425   CL 112 12/10/2011 0425   CO2 24 12/10/2011 0425   GLUCOSE 87 12/10/2011 0425   BUN 23 12/10/2011 0425   CREATININE 1.26* 12/10/2011 0425   CREATININE 2.17* 11/11/2011 1018   CALCIUM 6.5* 12/10/2011 0425   GFRNONAA 41* 12/10/2011 0425   GFRAA 48* 12/10/2011 0425   ABG    Component Value Date/Time   PHART 7.420* 12/09/2011 1616   PCO2ART 37.2 12/09/2011 1616   PO2ART 133.0* 12/09/2011 1616   HCO3 24.2* 12/09/2011 1616   TCO2 25 12/09/2011 1616   ACIDBASEDEF 3.0* 12/09/2011 1233   O2SAT 99.0 12/09/2011 1616   Dg Chest Portable 1 View  12/10/2011 .  Support apparatus satisfactory.  Suboptimal inspiration accounts for bibasilar atelectasis, left greater than right.  Small left pleural effusion.  Mild cardiomegaly without pulmonary edema.     Assessment and Plan:  76 y/o female s/p op AAA repair per vascular surgery. Significant blood loss ( ) with multiple blood products given.  No known underlying pulmonary issues.    1. Acute vent dependent respiratory failure -  PLAN -  Extubated  Incentive spirometry oob  2. Anemia - Hgb down this AM -transfusion and serial Hct per Vascular Surgery  3. History of Sheehan syndrome/pan hypopit -synthroid restarted per vascular -change hydrocortisone (50mg  IV 24h  today) and HGH (home dose) -can wean HC to home dose (20 mg PO) when starts PO -change fluids to D5NS while npo -  for low sugars  PCCM to sign off, await BMET today  Laurey Arrow PCCM [phone] If no response, call [phone]

## 2011-12-11 NOTE — Progress Notes (Signed)
SGC removed per M.D. order without difficulty.  I will continue to monitor.

## 2011-12-12 LAB — GLUCOSE, CAPILLARY
Glucose-Capillary: 72 mg/dL (ref 70–99)
Glucose-Capillary: 81 mg/dL (ref 70–99)
Glucose-Capillary: 90 mg/dL (ref 70–99)
Glucose-Capillary: 95 mg/dL (ref 70–99)

## 2011-12-12 LAB — CBC
HCT: 30.1 % — ABNORMAL LOW (ref 36.0–46.0)
Hemoglobin: 9.9 g/dL — ABNORMAL LOW (ref 12.0–15.0)
MCH: 29.6 pg (ref 26.0–34.0)
MCV: 89.9 fL (ref 78.0–100.0)
Platelets: 67 10*3/uL — ABNORMAL LOW (ref 150–400)
RBC: 3.35 MIL/uL — ABNORMAL LOW (ref 3.87–5.11)
WBC: 11 10*3/uL — ABNORMAL HIGH (ref 4.0–10.5)

## 2011-12-12 LAB — BASIC METABOLIC PANEL
BUN: 24 mg/dL — ABNORMAL HIGH (ref 6–23)
CO2: 26 mEq/L (ref 19–32)
GFR calc non Af Amer: 50 mL/min — ABNORMAL LOW (ref >90)
Glucose, Bld: 95 mg/dL (ref 70–99)
Potassium: 2.8 mEq/L — ABNORMAL LOW (ref 3.5–5.1)
Sodium: 145 mEq/L (ref 135–145)

## 2011-12-12 NOTE — Progress Notes (Addendum)
[facility] Progress Note  12/12/2011 9:42 AM POD 3  Subjective:  No complaints this am  Afebrile x 24hrs   130-170s sys  HR 80s-90s reg  94% 2LO2NC Filed Vitals:   12/12/11 0800  BP: 163/64  Pulse: 92  Temp:   Resp: 15     Physical Exam: Cardiac:  RRR Lungs:  Expiratory wheezes bilateral bases, otherwise clear Abdomen:  Soft NT/ND + BS no flatus or BM; erythema along lower portion of abdomen Incisions:  C/d/i with staples in tact. Extremities:  Bilateral feet are warm and well perfused.   CBC    Component Value Date/Time   WBC 11.0* 12/12/2011 0427   RBC 3.35* 12/12/2011 0427   HGB 9.9* 12/12/2011 0427   HCT 30.1* 12/12/2011 0427   PLT 67* 12/12/2011 0427   MCV 89.9 12/12/2011 0427   MCH 29.6 12/12/2011 0427   MCHC 32.9 12/12/2011 0427   RDW 17.4* 12/12/2011 0427   LYMPHSABS 1.8 12/01/2011 1108   MONOABS 1.2* 12/01/2011 1108   EOSABS 0.1 12/01/2011 1108   BASOSABS 0.0 12/01/2011 1108    BMET    Component Value Date/Time   NA 145 12/12/2011 0428   K 2.8* 12/12/2011 0428   CL 112 12/12/2011 0428   CO2 26 12/12/2011 0428   GLUCOSE 95 12/12/2011 0428   BUN 24* 12/12/2011 0428   CREATININE 1.08 12/12/2011 0428   CREATININE 2.17* 11/11/2011 1018   CALCIUM 7.4* 12/12/2011 0428   GFRNONAA 50* 12/12/2011 0428   GFRAA 58* 12/12/2011 0428    INR    Component Value Date/Time   INR 1.45 12/12/2011 0428     Intake/Output Summary (Last 24 hours) at 12/12/11 0942 Last data filed at 12/12/11 0911  Gross per 24 hour  Intake   1375 ml  Output   1435 ml  Net    -60 ml     Assessment/Plan:  76 y.o. female is s/p  1. Resection and grafting of infrarenal abdominal aortic aneurysm with  insertion of an aorto-bi-external iliac graft using a 16 x 8-mm  Hemashield Dacron graft.  2. Re-exploration of proximal anastomosis with proximal aortic  endarterectomy.  3. Reimplantation of inferior mesenteric artery into aortic graft.    POD 3 -keep in ICU -WBC is slightly improved -BUN/Cr  slightly up from yesterday, but stable -hypokalemia-supplement -acute surgical blood loss anemia-tolerating -may have ice chips -dc foley   Newton Pigg, PA-C [facility] [phone] 12/12/2011 9:42 AM  Addendum  I have independently interviewed and examined the patient, and I agree with the physician assistant's findings.  Minimal BS currently.  Pt can have few ice chips to keep mouth moist.  Marginal respiratory function will keep pt in ICU today.  Leonides Sake, MD [facility] [place] Office: [phone] Pager: [phone]  12/12/2011, 10:07 AM

## 2011-12-12 NOTE — Progress Notes (Signed)
Nursing  K+ on AM labs = 2.8. Per standing order, will replace with four 100 mEq runs KCL IV since patient is NPO at this time.  Will continue to monitor.  Dorette Grate RN

## 2011-12-13 LAB — TYPE AND SCREEN
Unit division: 0
Unit division: 0
Unit division: 0
Unit division: 0
Unit division: 0

## 2011-12-13 LAB — BASIC METABOLIC PANEL
BUN: 22 mg/dL (ref 6–23)
GFR calc Af Amer: 60 mL/min — ABNORMAL LOW (ref >90)
GFR calc non Af Amer: 52 mL/min — ABNORMAL LOW (ref >90)
Potassium: 3.1 mEq/L — ABNORMAL LOW (ref 3.5–5.1)

## 2011-12-13 LAB — CBC
Hemoglobin: 9.2 g/dL — ABNORMAL LOW (ref 12.0–15.0)
MCHC: 32.3 g/dL (ref 30.0–36.0)
RDW: 17 % — ABNORMAL HIGH (ref 11.5–15.5)

## 2011-12-13 LAB — GLUCOSE, CAPILLARY
Glucose-Capillary: 106 mg/dL — ABNORMAL HIGH (ref 70–99)
Glucose-Capillary: 142 mg/dL — ABNORMAL HIGH (ref 70–99)

## 2011-12-13 LAB — PROTIME-INR: Prothrombin Time: 14.7 seconds (ref 11.6–15.2)

## 2011-12-13 MED ORDER — LEVOTHYROXINE SODIUM 75 MCG PO TABS
75.0000 ug | ORAL_TABLET | Freq: Every day | ORAL | Status: DC
  Administered 2011-12-13 – 2011-12-16 (×4): 75 ug via ORAL
  Filled 2011-12-13 (×4): qty 1

## 2011-12-13 MED ORDER — ASPIRIN 81 MG PO TABS: 162.0000 mg | ORAL_TABLET | Freq: Every day | ORAL | Status: DC

## 2011-12-13 MED ORDER — ATORVASTATIN CALCIUM 10 MG PO TABS
10.0000 mg | ORAL_TABLET | Freq: Every day | ORAL | Status: DC
  Administered 2011-12-13 – 2011-12-15 (×3): 10 mg via ORAL
  Filled 2011-12-13 (×4): qty 1

## 2011-12-13 MED ORDER — PANTOPRAZOLE SODIUM 40 MG PO TBEC
40.0000 mg | DELAYED_RELEASE_TABLET | Freq: Every day | ORAL | Status: DC
  Administered 2011-12-13 – 2011-12-16 (×4): 40 mg via ORAL
  Filled 2011-12-13 (×4): qty 1

## 2011-12-13 MED ORDER — AMITRIPTYLINE HCL 25 MG PO TABS
25.0000 mg | ORAL_TABLET | Freq: Every day | ORAL | Status: DC
  Administered 2011-12-13 – 2011-12-15 (×3): 25 mg via ORAL
  Filled 2011-12-13 (×4): qty 1

## 2011-12-13 MED ORDER — CLOTRIMAZOLE 1 % EX CREA
TOPICAL_CREAM | Freq: Two times a day (BID) | CUTANEOUS | Status: DC
  Administered 2011-12-13 – 2011-12-16 (×7): via TOPICAL
  Filled 2011-12-13: qty 15

## 2011-12-13 MED ORDER — ATENOLOL 50 MG PO TABS
50.0000 mg | ORAL_TABLET | Freq: Every day | ORAL | Status: DC
  Administered 2011-12-13 – 2011-12-16 (×4): 50 mg via ORAL
  Filled 2011-12-13 (×4): qty 1

## 2011-12-13 MED ORDER — ASPIRIN EC 81 MG PO TBEC
162.0000 mg | DELAYED_RELEASE_TABLET | Freq: Every day | ORAL | Status: DC
  Administered 2011-12-13 – 2011-12-16 (×4): 162 mg via ORAL
  Filled 2011-12-13 (×4): qty 2

## 2011-12-13 MED ORDER — HYDROCORTISONE 20 MG PO TABS
20.0000 mg | ORAL_TABLET | Freq: Every day | ORAL | Status: DC
  Administered 2011-12-14 – 2011-12-16 (×3): 20 mg via ORAL
  Filled 2011-12-13 (×3): qty 1

## 2011-12-13 MED ORDER — DILTIAZEM HCL ER COATED BEADS 240 MG PO CP24
240.0000 mg | ORAL_CAPSULE | Freq: Every day | ORAL | Status: DC
  Administered 2011-12-13 – 2011-12-16 (×4): 240 mg via ORAL
  Filled 2011-12-13 (×4): qty 1

## 2011-12-13 MED ORDER — FLEET ENEMA 7-19 GM/118ML RE ENEM
1.0000 | ENEMA | Freq: Every day | RECTAL | Status: DC | PRN
  Filled 2011-12-13: qty 1

## 2011-12-13 MED ORDER — PIOGLITAZONE HCL 45 MG PO TABS
45.0000 mg | ORAL_TABLET | Freq: Every day | ORAL | Status: DC
  Administered 2011-12-13 – 2011-12-16 (×4): 45 mg via ORAL
  Filled 2011-12-13 (×5): qty 1

## 2011-12-13 MED ORDER — BISACODYL 10 MG RE SUPP: 10.0000 mg | Freq: Every day | RECTAL | Status: DC | PRN

## 2011-12-13 MED ORDER — TRIAMTERENE-HCTZ 37.5-25 MG PO TABS
1.0000 | ORAL_TABLET | Freq: Every day | ORAL | Status: DC
  Administered 2011-12-13 – 2011-12-16 (×4): 1 via ORAL
  Filled 2011-12-13 (×4): qty 1

## 2011-12-13 NOTE — Significant Event (Signed)
Pt CBG at 0821 was 67. Pt is alert & oriented, without any symptoms. Per MD Imogene Burn, patient can have clear liquids. Pt given juice to drink and recheck blood sugar was 75. Will monitor. Tobe Kervin, Charity fundraiser.

## 2011-12-13 NOTE — Plan of Care (Signed)
Problem: Phase II Progression Outcomes Goal: Weaning O2 to maintain Sats > 90% Outcome: Progressing Able to be on RA unless sleeping

## 2011-12-13 NOTE — Progress Notes (Signed)
This is data for 0300 because of time change

## 2011-12-13 NOTE — Plan of Care (Signed)
Problem: Problem: Mobility Progression Goal: INCREASED MOBILITY OR STRENGTH Outcome: Progressing Ambulated x 3 today, much better activity tolerance Goal: PARTICIPATES IN THERAPIES Outcome: Progressing Pt is more cooperative and willing to accept teaching

## 2011-12-13 NOTE — Significant Event (Signed)
Pt completed four runs of potassium chloride per standing order, for latest potassium of 3.1. Will monitor. Carmeline Kowal, Charity fundraiser.

## 2011-12-13 NOTE — Significant Event (Addendum)
Pt had a total of 2 bowel movements thus far. Pt and patient's spouse made aware that PRN medications for constipation are available and is offered. Per patient, stated that she does feel relieve from the BMs and stated she does not need the medication now. Pt verbalized that she will let staff know if she feels constipation. Will monitor. Chez Bulnes, Charity fundraiser.

## 2011-12-13 NOTE — Significant Event (Signed)
Pt had difficulty expelling stools. Pt requesting that she performs a manual disimpact herself. Manual disimpact done by RN. Pt persistent, requesting that she needed to disimpact herself instead. Pt allowed to manually disimpact the stools herself. As result of manual disimpact, pt had large, hardened stools. Will monitor. Korene Dula, Charity fundraiser.

## 2011-12-13 NOTE — Plan of Care (Signed)
Problem: Problem: Mobility Progression Goal: INCREASED MOBILITY OR STRENGTH Outcome: Progressing Pt is still very weak Goal: PARTICIPATES IN THERAPIES Outcome: Not Progressing Pt does not always want help or assistance

## 2011-12-13 NOTE — Significant Event (Signed)
Pt ambulated in the hall using own wheelchair. Pt had 3 rest stops during walk r/t back pain. Otherwise, pt tolerated ambulation well with VS stable. Returned to bed. Will monitor. Mali Eppard, Charity fundraiser

## 2011-12-13 NOTE — Progress Notes (Addendum)
[facility] [place]  Daily Progress Note  Assessment/Planning: POD #4 s/p open AAA repair, aortic endarterectomy   Some return of bowel function: advance diet and resume home po rx  Respiratory status improved  Improved mental status also  Not certain if rash on abd is contact dermatitis vs infection: will start abx as a precaution given aortic graft  Keep in SICU per wishes of Dr. Hart Rochester  Subjective  - 4 Days Post-Op  Feeling better, +Flatus and small BM  Objective Filed Vitals:   12/13/11 0600 12/13/11 0700 12/13/11 0800 12/13/11 0824  BP: 168/67 170/53 107/92   Pulse: 79 81 83   Temp:    97.6 F (36.4 C)  TempSrc:    Oral  Resp: 14 15 17    Height:      Weight:      SpO2: 100% 100% 90%     Intake/Output Summary (Last 24 hours) at 12/13/11 0922 Last data filed at 12/13/11 0825  Gross per 24 hour  Intake 1420.83 ml  Output    536 ml  Net 884.83 ml    PULM  CTAB, [place] oxygen, no wheezing today CV  RRR GI  soft, obese, appropriately TTP, inc c/d/i, staples in place, increased erythema in lower abdomen with some desquamation VASC  Both feet pink, warm and viable  Laboratory CBC    Component Value Date/Time   WBC 9.4 12/13/2011 0401   HGB 9.2* 12/13/2011 0401   HCT 28.5* 12/13/2011 0401   PLT 64* 12/13/2011 0401    BMET    Component Value Date/Time   NA 144 12/13/2011 0401   K 3.1* 12/13/2011 0401   CL 110 12/13/2011 0401   CO2 26 12/13/2011 0401   GLUCOSE 99 12/13/2011 0401   BUN 22 12/13/2011 0401   CREATININE 1.04 12/13/2011 0401   CREATININE 2.17* 11/11/2011 1018   CALCIUM 7.5* 12/13/2011 0401   GFRNONAA 52* 12/13/2011 0401   GFRAA 60* 12/13/2011 0401    Leonides Sake, MD [facility] [place] Office: [phone] Pager: [phone]  12/13/2011, 9:22 AM

## 2011-12-13 NOTE — Significant Event (Signed)
Pt ambulated approximately 225 feet, without any stops. VS stable during ambulation. Pt requested to return to room, c/o back pain. Back to room, pt in bed. Will monitor. Zarin Knupp, Charity fundraiser.

## 2011-12-14 LAB — BASIC METABOLIC PANEL
BUN: 16 mg/dL (ref 6–23)
CO2: 26 mEq/L (ref 19–32)
Chloride: 108 mEq/L (ref 96–112)
Creatinine, Ser: 0.99 mg/dL (ref 0.50–1.10)
Potassium: 2.9 mEq/L — ABNORMAL LOW (ref 3.5–5.1)

## 2011-12-14 LAB — GLUCOSE, CAPILLARY
Glucose-Capillary: 92 mg/dL (ref 70–99)
Glucose-Capillary: 92 mg/dL (ref 70–99)

## 2011-12-14 LAB — CBC
HCT: 26.4 % — ABNORMAL LOW (ref 36.0–46.0)
Hemoglobin: 8.6 g/dL — ABNORMAL LOW (ref 12.0–15.0)
MCHC: 32.6 g/dL (ref 30.0–36.0)
MCV: 91 fL (ref 78.0–100.0)
RDW: 16.8 % — ABNORMAL HIGH (ref 11.5–15.5)

## 2011-12-14 MED ORDER — BIOTENE DRY MOUTH MT LIQD: 15.0000 mL | Freq: Two times a day (BID) | OROMUCOSAL | Status: DC

## 2011-12-14 MED ORDER — POTASSIUM CITRATE ER 10 MEQ (1080 MG) PO TBCR: 10.0000 meq | EXTENDED_RELEASE_TABLET | Freq: Three times a day (TID) | ORAL | Status: DC

## 2011-12-14 MED ORDER — INSULIN ASPART 100 UNIT/ML ~~LOC~~ SOLN: 0.0000 [IU] | Freq: Three times a day (TID) | SUBCUTANEOUS | Status: DC

## 2011-12-14 MED ORDER — POTASSIUM CHLORIDE CRYS ER 10 MEQ PO TBCR
10.0000 meq | EXTENDED_RELEASE_TABLET | Freq: Three times a day (TID) | ORAL | Status: DC
  Administered 2011-12-14 – 2011-12-15 (×3): 10 meq via ORAL
  Filled 2011-12-14 (×6): qty 1

## 2011-12-14 MED FILL — Sodium Chloride IV Soln 0.9%: INTRAVENOUS | Qty: 1000 | Status: AC

## 2011-12-14 MED FILL — Heparin Sodium (Porcine) Inj 1000 Unit/ML: INTRAMUSCULAR | Qty: 30 | Status: AC

## 2011-12-14 MED FILL — Sodium Chloride Irrigation Soln 0.9%: Qty: 3000 | Status: AC

## 2011-12-14 NOTE — Progress Notes (Signed)
SL pt's Right IJ per MD order to saline lock if PO intake adequate. Pt eating entire meal and drinking plenty of water. Will continue to monitor.

## 2011-12-14 NOTE — Progress Notes (Signed)
Patient was transferred from 2300 to Unit 2000 per MD order.  Report was given to receiving RN prior to transport.  Patient's chart, personal belongings, and medications were all sent with patient.  SMITH, Miara Emminger A. RN

## 2011-12-14 NOTE — Progress Notes (Addendum)
[facility] [place]  Post-op  Intra-abdominal Surgery note  Date of Surgery: 12/09/2011 Surgeon: Surgeon(s): Pryor Ochoa, MD Fransisco Hertz, MD POD: 5 Days Post-Op Procedure(s): ANEURYSM ABDOMINAL AORTIC REPAIR AORTIC ENDARTERECETOMY  History of Present Illness  Katie Pace is a 76 y.o. female who is  up s/p  ANEURYSM ABDOMINAL AORTIC REPAIR  Pt is doing well. denies incisional pain; denies nausea/vomiting; denies diarrhea. has had flatus;has had BM x 2 yesterday. Wheezing after ambulation. Sats 97% on R/A.  IMAGING: No results found.  Significant Diagnostic Studies: CBC Lab Results  Component Value Date   WBC 7.1 12/14/2011   HGB 8.6* 12/14/2011   HCT 26.4* 12/14/2011   MCV 91.0 12/14/2011   PLT 75* 12/14/2011    BMET    Component Value Date/Time   NA 141 12/14/2011 0400   K 2.9* 12/14/2011 0400   CL 108 12/14/2011 0400   CO2 26 12/14/2011 0400   GLUCOSE 109* 12/14/2011 0400   BUN 16 12/14/2011 0400   CREATININE 0.99 12/14/2011 0400   CREATININE 2.17* 11/11/2011 1018   CALCIUM 7.3* 12/14/2011 0400   GFRNONAA 55* 12/14/2011 0400   GFRAA 64* 12/14/2011 0400   COAG Lab Results  Component Value Date   INR 1.13 12/14/2011   INR 1.13 12/13/2011   INR 1.45 12/12/2011   No results found for this basename: PTT    I/O last 3 completed shifts: In: 4020 [P.O.:1895; I.V.:1675; IV Piggyback:450] Out: 1901 [Urine:1900; Stool:1]    Physical Examination BP Readings from Last 3 Encounters:  12/14/11 133/68  12/14/11 133/68  12/01/11 127/73   Temp Readings from Last 3 Encounters:  12/14/11 97.4 F (36.3 C) Axillary  12/14/11 97.4 F (36.3 C) Axillary  12/01/11 98.6 F (37 C)    SpO2 Readings from Last 3 Encounters:  12/14/11 97%  12/14/11 97%  12/01/11 96%   Pulse Readings from Last 3 Encounters:  12/14/11 64  12/14/11 64  12/01/11 72    General: A&O x 3, WDWN female in NAD Pulmonary: normal non-labored breathing , with Rales at bases,  wheezing Cardiac: Heart rate : regular ,  Abdomen:abdomen soft and non-tender  Abdominal wound:clean, dry, intact  Neurologic: A&O X 3; Appropriate Affect ; SENSATION: normal; MOTOR FUNCTION:  moving all extremities equally. Speech is fluent/normal  Vascular Exam:BLE warm and well perfused Extremities without ischemic changes, no Gangrene , no cellulitis; no open wounds;   Assessment/Plan: Katie Pace is a 76 y.o. female who is 5 Days Post-Op Procedure(s): ANEURYSM ABDOMINAL AORTIC REPAIR AORTIC ENDARTERECETOMY DOE with wheezes and rales at bases Hypokalemia - being replaced - started pt back on po K+ which she takes at home, repeat labs in am Acute Blood loss anemia sec to surgery Advance diet Transfer to 3300 ? Lasix this am - pt on maxide with HTCZ Repeat labs in am - if HCT continues to drop , may need transfusion     Marlowe Shores [phone] 12/14/2011 7:15 AM      Agree with above Patient 5 days post resection and grafting of abdominal aortic aneurysm with aortobiiliac sternal iliac graft and reimplantation inferior mesenteric artery. Chest clear Abdomen soft with well healing incision 3+ femoral and popliteal pulses palpable bilaterally Beginning to ambulate with less assistance Bowel movements yesterday on clear liquid diet currently  Plan increase diet as tolerated to soft diet tomorrow Transferred to 2000 Increase ambulation Replace potassium-K. today 2.9 Followup hematocrit today 26.4  Generally doing well  Patient had expected blood loss anemia postoperatively. This has continued to be monitored on a daily basis.

## 2011-12-14 NOTE — Evaluation (Signed)
Occupational Therapy Evaluation Patient Details Name: Katie Pace MRN: [id number] DOB: 11-04-1935 Today's Date: 12/14/2011 10:56-11:22  evII Problem List:  Patient Active Problem List  Diagnoses  . Abdominal aneurysm without mention of rupture    Past Medical History:  Past Medical History  Diagnosis Date  . Hypertension   . Hypothyroidism   . Stroke     Ischemic x 2       last one 2007  . Diabetes mellitus     type 11  . Endocrine disturbance     gland has not  worked until 1970.Marland Kitchen..dx in 1977  . Blood transfusion     back in 1973  . GERD (gastroesophageal reflux disease)     on meds to help....meds do well    Past Surgical History:  Past Surgical History  Procedure Date  . Spine surgery     Lumbar Diskectomy  . Joint replacement May 2009    Right knee  . Joint replacement April 2008    Right Hip prothesis  . Carotid endarterectomy Dec. 2009  . Cardiac catheterization     1980  &  2009  . Appendectomy     1960  . Cesarean section   . Back surgery     1990's  . Abdominal hysterectomy 1973    1973  . Fracture surgery     left ankle   . Abdominal aortic aneurysm repair 12/09/2011    Procedure: ANEURYSM ABDOMINAL AORTIC REPAIR;  Surgeon: Pryor Ochoa, MD;  Location: [facility] OR;  Service: Vascular;  Laterality: N/A;  resection and grafting abdominal aortic aneurysm; Insertion Bilateral External Iliac Graft; Reexploration Proximal Anastemosis; Aortic Endarterectomy; Reimplantation Inferior Mesenteric Artery; Intraaortic Graft    OT Assessment/Plan/Recommendation OT Assessment Clinical Impression Statement: Pleasant 76 yr old female admitted secondary to aortic aneurysm.  Underwent successful surgical repair and now presents with increased pain and decreased overall independence with basic ADLs.  Feel pt will benefit from acute OT services to address these issues and increase independence back to a modified independnet level.  Pt will have supervision from her spouse at  D/C.  OT Recommendation/Assessment: Patient will need skilled OT in the acute care venue OT Problem List: Decreased strength;Decreased activity tolerance;Impaired balance (sitting and/or standing);Decreased knowledge of use of DME or AE;Pain OT Therapy Diagnosis : Generalized weakness;Acute pain OT Plan OT Frequency: Min 2X/week OT Treatment/Interventions: Self-care/ADL training;Therapeutic activities;DME and/or AE instruction;Energy conservation;Balance training;Patient/family education OT Recommendation Follow Up Recommendations: Home health OT Equipment Recommended: None recommended by OT Individuals Consulted Consulted and Agree with Results and Recommendations: Patient;Family member/caregiver Family Member Consulted: spouse OT Goals Acute Rehab OT Goals OT Goal Formulation: With patient Time For Goal Achievement: 2 weeks ADL Goals Pt Will Perform Grooming: with modified independence;Standing at sink (3 tasks) ADL Goal: Grooming - Progress: Goal set today Pt Will Perform Lower Body Bathing: with supervision;Sit to stand from chair;Sit to stand from bed ADL Goal: Lower Body Bathing - Progress: Goal set today Pt Will Perform Lower Body Dressing: with supervision;with adaptive equipment;Sit to stand from chair;Sit to stand from bed (AE PRN if needed, depending on pain.) ADL Goal: Lower Body Dressing - Progress: Goal set today Pt Will Transfer to Toilet: with modified independence;with DME;Grab bars;Comfort height toilet ADL Goal: Toilet Transfer - Progress: Goal set today Pt Will Perform Toileting - Clothing Manipulation: with modified independence;Sitting on 3-in-1 or toilet;Standing ADL Goal: Toileting - Clothing Manipulation - Progress: Goal set today Pt Will Perform Toileting - Hygiene: with modified independence;Sit  to stand from 3-in-1/toilet ADL Goal: Toileting - Hygiene - Progress: Goal set today  OT Evaluation Precautions/Restrictions  Precautions Precautions:  Fall Precaution Comments: NG. lines Required Braces or Orthoses: No Restrictions Weight Bearing Restrictions: No Prior Functioning Home Living Lives With: Spouse Receives Help From: Family Type of Home: House Home Layout: Two level;Able to live on main level with bedroom/bathroom Home Access: Stairs to enter Entrance Stairs-Rails: Right Entrance Stairs-Number of Steps: 1 Bathroom Shower/Tub: Engineer, manufacturing systems: Standard Bathroom Accessibility: Yes Home Adaptive Equipment: Walker - rolling;Wheelchair - manual;Tub transfer bench Additional Comments: Pt states she walks with two ski poles because she doesn't like a walker Prior Function Level of Independence: Requires assistive device for independence;Independent with transfers;Needs assistance with ADLs;Needs assistance with homemaking Bath: Moderate Dressing: Moderate Meal Prep: Maximal Light Housekeeping: Maximal Driving: No Vocation: Retired ADL ADL Eating/Feeding: Modified independent Where Assessed - Eating/Feeding: Chair Grooming: Performed;Wash/dry hands;Set up Where Assessed - Grooming: Sitting, chair Upper Body Bathing: Simulated;Set up Where Assessed - Upper Body Bathing: Supported;Sitting, chair Lower Body Bathing: Simulated;Minimal assistance Where Assessed - Lower Body Bathing: Sit to stand from chair Upper Body Dressing: Simulated;Set up Where Assessed - Upper Body Dressing: Sitting, chair Lower Body Dressing: Simulated;Minimal assistance Where Assessed - Lower Body Dressing: Sit to stand from chair Toilet Transfer: Performed;Minimal assistance Toilet Transfer Method: Ambulating Toilet Transfer Equipment: Comfort height toilet;Grab bars Toileting - Clothing Manipulation: Performed;Minimal assistance Where Assessed - Toileting Clothing Manipulation: Sit to stand from 3-in-1 or toilet Toileting - Hygiene: Performed;Supervision/safety Where Assessed - Toileting Hygiene: Sit on 3-in-1 or  toilet Tub/Shower Transfer: Not assessed Tub/Shower Transfer Method: Not assessed Equipment Used: Rolling walker Ambulation Related to ADLs: Pt overall min guard assist for short distance ambulation in the room to and from the bathroom.  Pt only tolerated standing activity for approximately 1 minute before needing to sit and rest.   ADL Comments: Pt with overall decreased endurance for simulated selfcare tasks.  Also with decreased ability to cross her LEs and reach her feet for bathing and dressing tasks.   Vision/Perception  Vision - History Baseline Vision: Wears glasses all the time Patient Visual Report: No change from baseline Vision - Assessment Vision Assessment: Vision not tested Perception Perception: Within Functional Limits Praxis Praxis: Intact Cognition Cognition Arousal/Alertness: Awake/alert Overall Cognitive Status: Appears within functional limits for tasks assessed Orientation Level: Oriented X4 Safety/Judgement: Good awareness of safety precautions Sensation/Coordination Sensation Light Touch: Impaired Detail Light Touch Impaired Details: Impaired LUE;Impaired RUE Stereognosis: Not tested Hot/Cold: Not tested Proprioception: Not tested Additional Comments: Pt reports numbness in bilateral finger tips. Coordination Gross Motor Movements are Fluid and Coordinated: Yes Fine Motor Movements are Fluid and Coordinated: Yes Extremity Assessment RUE Assessment RUE Assessment: Within Functional Limits LUE Assessment LUE Assessment: Within Functional Limits Mobility  Bed Mobility Bed Mobility: No Transfers Transfers: Yes Sit to Stand: From chair/3-in-1;With upper extremity assist;With armrests;4: Min assist Stand to Sit: 4: Min assist;Without upper extremity assist;With upper extremity assist;To toilet Stand to Sit Details: Pt sat back in chair without using UEs for support. End of Session OT - End of Session Equipment Utilized During Treatment: Other  (comment) Adult nurse) Activity Tolerance: Patient limited by fatigue;Patient limited by pain Patient left: with call bell in reach;in chair;with family/visitor present Nurse Communication: Mobility status for transfers General Behavior During Session: [facility] for tasks performed Cognition: [facility] for tasks performed   Little Winton OTR/L 12/14/2011, 11:38 AM

## 2011-12-14 NOTE — Progress Notes (Signed)
Physical Therapy Treatment Patient Details Name: Katie Pace MRN: [id number] DOB: [birth year] Today's Date: 12/14/2011  PT Assessment/Plan  PT - Assessment/Plan Comments on Treatment Session: Pt s/p AAA repair progressing well with ambulation and transfers. Will need to attempt stairs next session. Pt encouraged to ambulate at least 1 more time today with nursing and to continue HEP. Spouse present throughout. PT Plan: Discharge plan remains appropriate;Frequency remains appropriate Follow Up Recommendations: Home health PT Equipment Recommended: None recommended by OT PT Goals  Acute Rehab PT Goals Pt will go Sit to Stand: with modified independence PT Goal: Sit to Stand - Progress: Updated due to goal met Pt will go Stand to Sit: with modified independence PT Goal: Stand to Sit - Progress: Updated due to goals met Pt will Ambulate: >150 feet;with modified independence;with least restrictive assistive device PT Goal: Ambulate - Progress: Updated due to goal met PT Goal: Up/Down Stairs - Progress: Progressing toward goal  PT Treatment Precautions/Restrictions  Precautions Precautions: Fall Precaution Comments: 0 Required Braces or Orthoses: No Restrictions Weight Bearing Restrictions: No Mobility (including Balance) Bed Mobility Bed Mobility: No Transfers Sit to Stand: 5: Supervision;From toilet;From bed Sit to Stand Details (indicate cue type and reason): cueing for hand placement, use of rail from toilet Stand to Sit: 5: Supervision;To bed Stand to Sit Details: cueing for hand placement Ambulation/Gait Ambulation/Gait: Yes Ambulation/Gait Assistance: 5: Supervision Ambulation/Gait Assistance Details (indicate cue type and reason): cueing to step into RW Ambulation Distance (Feet): 200 Feet Assistive device: Rolling walker Gait Pattern: Step-through pattern;Decreased stride length Stairs: No (pt denied attempting)  Posture/Postural Control Posture/Postural Control:  No significant limitations Exercise  General Exercises - Lower Extremity Long Arc Quad: AROM;Both;20 reps;Seated Hip Flexion/Marching: AROM;Both;Seated;Other reps (comment) (20reps) Toe Raises: AROM;Both;20 reps;Seated Heel Raises: AROM;Both;Other reps (comment);Seated (20reps) End of Session PT - End of Session Equipment Utilized During Treatment: Gait belt Activity Tolerance: Patient tolerated treatment well Patient left: in bed;with call bell in reach;with family/visitor present Nurse Communication: Mobility status for transfers;Mobility status for ambulation General Behavior During Session: [facility] for tasks performed Cognition: [facility] for tasks performed  Delorse Lek 12/14/2011, 12:24 PM Delaney Meigs, PT [phone]

## 2011-12-15 LAB — BASIC METABOLIC PANEL
CO2: 26 mEq/L (ref 19–32)
Chloride: 103 mEq/L (ref 96–112)
GFR calc Af Amer: 74 mL/min — ABNORMAL LOW (ref >90)
Sodium: 138 mEq/L (ref 135–145)

## 2011-12-15 LAB — GLUCOSE, CAPILLARY
Glucose-Capillary: 70 mg/dL (ref 70–99)
Glucose-Capillary: 84 mg/dL (ref 70–99)
Glucose-Capillary: 96 mg/dL (ref 70–99)

## 2011-12-15 LAB — CBC
HCT: 31.6 % — ABNORMAL LOW (ref 36.0–46.0)
MCV: 89.5 fL (ref 78.0–100.0)
Platelets: 127 10*3/uL — ABNORMAL LOW (ref 150–400)
RBC: 3.53 MIL/uL — ABNORMAL LOW (ref 3.87–5.11)
RDW: 16.4 % — ABNORMAL HIGH (ref 11.5–15.5)
WBC: 8.5 10*3/uL (ref 4.0–10.5)

## 2011-12-15 LAB — PROTIME-INR
INR: 1.09 (ref 0.00–1.49)
Prothrombin Time: 14.3 seconds (ref 11.6–15.2)

## 2011-12-15 MED ORDER — POTASSIUM CHLORIDE CRYS ER 20 MEQ PO TBCR
20.0000 meq | EXTENDED_RELEASE_TABLET | Freq: Once | ORAL | Status: AC
  Administered 2011-12-15: 20 meq via ORAL
  Filled 2011-12-15: qty 1

## 2011-12-15 MED ORDER — ASPIRIN-DIPYRIDAMOLE ER 25-200 MG PO CP12
1.0000 | ORAL_CAPSULE | Freq: Two times a day (BID) | ORAL | Status: DC
  Administered 2011-12-15 – 2011-12-16 (×3): 1 via ORAL
  Filled 2011-12-15 (×5): qty 1

## 2011-12-15 MED ORDER — POTASSIUM CHLORIDE CRYS ER 20 MEQ PO TBCR
20.0000 meq | EXTENDED_RELEASE_TABLET | Freq: Two times a day (BID) | ORAL | Status: DC
  Administered 2011-12-15 – 2011-12-16 (×2): 20 meq via ORAL
  Filled 2011-12-15 (×2): qty 1

## 2011-12-15 NOTE — Progress Notes (Signed)
[facility] [place]  Post-op  Intra-abdominal Surgery note  Date of Surgery: 12/09/2011 Surgeon: Surgeon(s): Pryor Ochoa, MD Fransisco Hertz, MD POD: 6 Days Post-Op Procedure(s): ANEURYSM ABDOMINAL AORTIC REPAIR AORTIC ENDARTERECETOMY  History of Present Illness  Katie Pace is a 76 y.o. female who is  up s/p Procedure(s): ANEURYSM ABDOMINAL AORTIC REPAIR AORTIC ENDARTERECETOMY Pt is doing well. complains of her chronic LBP - has hx of Degen disc disease; denies nausea/vomiting; denies diarrhea. has had flatus;has had BM eating well, ambulating  IMAGING: No results found.  Significant Diagnostic Studies: CBC Lab Results  Component Value Date   WBC 8.5 12/15/2011   HGB 10.5* 12/15/2011   HCT 31.6* 12/15/2011   MCV 89.5 12/15/2011   PLT 127* 12/15/2011    BMET    Component Value Date/Time   NA 138 12/15/2011 0605   K 3.0* 12/15/2011 0605   CL 103 12/15/2011 0605   CO2 26 12/15/2011 0605   GLUCOSE 71 12/15/2011 0605   BUN 14 12/15/2011 0605   CREATININE 0.88 12/15/2011 0605   CREATININE 2.17* 11/11/2011 1018   CALCIUM 8.1* 12/15/2011 0605   GFRNONAA 64* 12/15/2011 0605   GFRAA 74* 12/15/2011 0605   COAG Lab Results  Component Value Date   INR 1.09 12/15/2011   INR 1.13 12/14/2011   INR 1.13 12/13/2011   No results found for this basename: PTT    I/O last 3 completed shifts: In: 1420 [P.O.:670; I.V.:750] Out: 1200 [Urine:1200] Total I/O In: -  Out: 750 [Urine:750]  Physical Examination BP Readings from Last 3 Encounters:  12/15/11 156/67  12/15/11 156/67  12/01/11 127/73   Temp Readings from Last 3 Encounters:  12/15/11 97.8 F (36.6 C)   12/15/11 97.8 F (36.6 C)   12/01/11 98.6 F (37 C)    SpO2 Readings from Last 3 Encounters:  12/15/11 94%  12/15/11 94%  12/01/11 96%   Pulse Readings from Last 3 Encounters:  12/15/11 76  12/15/11 76  12/01/11 72    General:  WDWN female in NAD Pulmonary: normal non-labored breathing    Abdomen:abdomen soft, non-tender and normal active bowel sounds Abdominal wound:clean, dry, intact   MOTOR FUNCTION:  moving all extremities equally. Speech is fluent/normal  Vascular Exam:BLE warm and well perfused Extremities without ischemic changes, no Gangrene , no cellulitis; no open wounds;   LOWER EXTREMITY PULSES           RIGHT                                      LEFT      POSTERIOR TIBIAL palpable palpable       DORSALIS PEDIS      ANTERIOR TIBIAL palpable     Assessment:Plan:  Katie Pace is a 77 y.o. female who is 6 Days Post-Op Procedure(s): ANEURYSM ABDOMINAL AORTIC REPAIR AORTIC ENDARTERECETOMY Pt doing well, taking PO diet well Ambulating Hypokalemic - replace K+ - 40 Meq this am and 20 Meq this PM Acute post op blood loss anemia better -  may have been dilutional Restart Aggrenox, - pt states she takes this for hx of stroke    Marlowe Shores [phone] 12/15/2011 8:47 AM

## 2011-12-15 NOTE — Progress Notes (Signed)
Physical Therapy Treatment Patient Details Name: KENZLEIGH SEDAM MRN: [id number] DOB: Apr 03, 1936 Today's Date: 12/15/2011  PT Assessment/Plan  PT - Assessment/Plan Comments on Treatment Session: Pt s/p AAA repair with excellent progression and ambulation. Pt has met goals for safe discharge but will continue to work toward independence with mobility while in acute setting. PT Plan: Discharge plan remains appropriate PT Goals  Acute Rehab PT Goals Pt will go Supine/Side to Sit: with modified independence;with HOB 0 degrees;Other (comment) (without rails) PT Goal: Supine/Side to Sit - Progress: Progressing toward goal PT Goal: Sit to Supine/Side - Progress: Progressing toward goal PT Goal: Sit to Stand - Progress: Met PT Goal: Stand to Sit - Progress: Met PT Goal: Ambulate - Progress: Progressing toward goal Pt will Go Up / Down Stairs: 1-2 stairs;with least restrictive assistive device;with min assist PT Goal: Up/Down Stairs - Progress: Met  PT Treatment Precautions/Restrictions  Precautions Precautions: Fall Precaution Comments: 0 Required Braces or Orthoses: No Restrictions Weight Bearing Restrictions: No Mobility (including Balance) Bed Mobility Bed Mobility: Yes Rolling Left: 6: Modified independent (Device/Increase time);With rail Left Sidelying to Sit: 6: Modified independent (Device/Increase time);HOB flat;With rails Sitting - Scoot to Edge of Bed: 6: Modified independent (Device/Increase time) Transfers Sit to Stand: 6: Modified independent (Device/Increase time);4: Min assist;From bed;From toilet;Other (comment) (stair) Sit to Stand Details (indicate cue type and reason): mod I from bed and BSC, pt sat on stairs due to back pain and min assist to get up from low surface Stand to Sit: To toilet;To chair/3-in-1;Other (comment);6: Modified independent (Device/Increase time);4: Min assist (stair) Stand to Sit Details: min assist to stair Ambulation/Gait Ambulation/Gait  Assistance: 5: Supervision Ambulation/Gait Assistance Details (indicate cue type and reason): cues to step into RW and extend trunk Ambulation Distance (Feet): 200 Feet (200 ft x 2 trials with 3 min seated rest between) Assistive device: Rolling walker Gait Pattern: Decreased step length - left;Trunk flexed;Step-through pattern Stairs: Yes Stairs Assistance: 4: Min assist Stairs Assistance Details (indicate cue type and reason): cueing to sequence Stair Management Technique: Backwards;With walker Number of Stairs: 2  Height of Stairs: 8   Posture/Postural Control Posture/Postural Control: No significant limitations Exercise  General Exercises - Lower Extremity Long Arc Quad: AROM;Both;20 reps;Seated Hip ABduction/ADduction: AROM;Both;20 reps;Seated Hip Flexion/Marching: AROM;Both;10 reps;Seated End of Session PT - End of Session Activity Tolerance: Patient tolerated treatment well;Patient limited by pain Patient left: in chair;with call bell in reach;with family/visitor present General Behavior During Session: [facility] for tasks performed Cognition: [facility] for tasks performed  Delorse Lek 12/15/2011, 9:58 AM Delaney Meigs, PT [phone]

## 2011-12-15 NOTE — Progress Notes (Signed)
Utilization review completed. Kamel Haven, RN, BSN.  12/15/11  

## 2011-12-16 LAB — CBC
HCT: 33.9 % — ABNORMAL LOW (ref 36.0–46.0)
MCH: 28.9 pg (ref 26.0–34.0)
MCV: 89.9 fL (ref 78.0–100.0)
Platelets: 166 10*3/uL (ref 150–400)
RBC: 3.77 MIL/uL — ABNORMAL LOW (ref 3.87–5.11)
WBC: 10.4 10*3/uL (ref 4.0–10.5)

## 2011-12-16 LAB — BASIC METABOLIC PANEL
BUN: 16 mg/dL (ref 6–23)
CO2: 27 mEq/L (ref 19–32)
Calcium: 8.2 mg/dL — ABNORMAL LOW (ref 8.4–10.5)
Chloride: 101 mEq/L (ref 96–112)
Creatinine, Ser: 0.94 mg/dL (ref 0.50–1.10)

## 2011-12-16 LAB — GLUCOSE, CAPILLARY

## 2011-12-16 MED ORDER — POTASSIUM CHLORIDE CRYS ER 20 MEQ PO TBCR: 20.0000 meq | EXTENDED_RELEASE_TABLET | Freq: Two times a day (BID) | ORAL | Status: DC

## 2011-12-16 MED ORDER — OXYCODONE-ACETAMINOPHEN 5-325 MG PO TABS: 1.0000 | ORAL_TABLET | ORAL | Status: AC | PRN

## 2011-12-16 NOTE — Progress Notes (Signed)
Patient ID: Katie Pace, female   DOB: Dec 07, 1935, 76 y.o.   MRN: [id number] Vascular Surgery Progress Note  Subjective: 7 days post resection and grafting of abdominal aortic aneurysm with insertion aorto by external iliac graft with reimplantation inferior mesenteric artery and proximal aortic endarterectomy Patient is ambulating well in the halls. Having bowel movements on a regular basis. Tolerating soft diet well. Has excellent urinary output. All lab is stable with hematocrit Artie 3%, platelet count 160,000, creatinine 0.9, potassium 3.3.  Objective:  Filed Vitals:   12/16/11 0500  BP: 146/70  Pulse: 72  Temp: 98 F (36.7 C)  Resp: 19    General alert and oriented x3 in no apparent distress Chest no rhonchi or wheezing Abdomen soft incision healing nicely 3+ femoral and popliteal pulses palpable with well perfused lower extremities   Labs:  Lab 12/16/11 0515 12/15/11 0605 12/14/11 0400  CREATININE 0.94 0.88 0.99    Lab 12/16/11 0515 12/15/11 0605 12/14/11 0400  NA 137 138 141  K 3.3* 3.0* 2.9*  CL 101 103 108  CO2 27 26 26   BUN 16 14 16   CREATININE 0.94 0.88 0.99  LABGLOM -- -- --  GLUCOSE 75 -- --  CALCIUM 8.2* 8.1* 7.3*    Lab 12/16/11 0515 12/15/11 0605 12/14/11 0400  WBC 10.4 8.5 7.1  HGB 10.9* 10.5* 8.6*  HCT 33.9* 31.6* 26.4*  PLT 166 127* 75*    Lab 12/16/11 0515 12/15/11 0605 12/14/11 0400  INR 1.07 1.09 1.13    I/O last 3 completed shifts: In: 1680 [P.O.:1680] Out: 750 [Urine:750]  Imaging: No results found.  Assessment/Plan:  POD #7  LOS: 7 days  s/p Procedure(s): ANEURYSM ABDOMINAL AORTIC REPAIR AORTIC ENDARTERECETOMY  Patient ready for discharge home today Will return next week for staple removal Return in 2 weeks for followup by Dr. Gaspar Garbe, MD 12/16/2011 8:03 AM

## 2011-12-16 NOTE — Progress Notes (Signed)
Occupational Therapy Treatment Patient Details Name: Katie Pace MRN: [id number] DOB: July 07, 1936 Today's Date: 12/16/2011  OT Assessment/Plan OT Assessment/Plan Comments on Treatment Session: Pt progressing well but continues to be easily fatigued with activity. Educated on energy conservation. OT Plan: Discharge plan remains appropriate Follow Up Recommendations: Home health OT Equipment Recommended: None recommended by OT OT Goals ADL Goals ADL Goal: Grooming - Progress: Met ADL Goal: Lower Body Bathing - Progress: Progressing toward goals ADL Goal: Lower Body Dressing - Progress: Progressing toward goals ADL Goal: Toilet Transfer - Progress: Progressing toward goals  OT Treatment Precautions/Restrictions  Precautions Precautions: Fall Restrictions Weight Bearing Restrictions: No   ADL ADL Grooming: Performed;Wash/dry hands;Modified independent (prop self on sink secondary to fatigue) Where Assessed - Grooming: Standing at sink Lower Body Dressing: Performed;Set up;Supervision/safety Lower Body Dressing Details (indicate cue type and reason): doff socks, donned shoes Where Assessed - Lower Body Dressing: Sitting, chair Tub/Shower Transfer: Simulated;Minimal assistance Tub/Shower Transfer Details (indicate cue type and reason): Simulated in room with trash can. Min A to clear LLE over "tub ledge" - pt states this leg is weaker than other premorbidly. Educated pt to do "marching in place" while standing at sink or countertop to [employer] Method: Ambulating Ambulation Related to ADLs: Supervision with RW ambulation. Pt easily fatigued and c/o back pain after simulated tub transfer. prefers to sit in straight back chair.  End of Session OT - End of Session Equipment Utilized During Treatment: Gait belt Activity Tolerance: Patient limited by fatigue;Patient limited by pain Patient left: in chair;with call bell in reach;with family/visitor  present Nurse Communication: Mobility status for transfers;Mobility status for ambulation General Behavior During Session: [facility] for tasks performed Cognition: [facility] for tasks performed  Adela Esteban  12/16/2011, 9:34 AM

## 2011-12-16 NOTE — Discharge Summary (Signed)
[facility] Discharge Summary   Patient ID:  Katie Pace MRN: [id number] DOB/AGE: Mar 30, 1936 76 y.o.  Admit date: 12/09/2011 Discharge date: 12/16/2011 Date of Surgery: 12/09/2011 Surgeon: Surgeon(s): Pryor Ochoa, MD Fransisco Hertz, MD  Admission Diagnosis: Abdominal Aortic Aneurysm  Discharge Diagnoses:  Abdominal Aortic Aneurysm  Secondary Diagnoses: Past Medical History  Diagnosis Date  . Hypertension   . Hypothyroidism   . Stroke     Ischemic x 2       last one 2007  . Diabetes mellitus     type 11  . Endocrine disturbance     gland has not  worked until 1970.Marland Kitchen..dx in 1977  . Blood transfusion     back in 1973  . GERD (gastroesophageal reflux disease)     on meds to help....meds do well     Procedure(s): ANEURYSM ABDOMINAL AORTIC REPAIR AORTIC ENDARTERECETOMY  Discharged Condition: good  HPI:  Katie Pace is a 76 y.o. female who was seen by Dr. Hart Rochester for further discussion regarding her abdominal aortic aneurysm. Her nausea has been managed by anti-emetic medications. She is having no, or back pain. She had a CT scan performed last week which was reviewed. It was done without contrast because her creatinine is 1.9 and did not improve much with hydration she is not a candidate for aortic stent grafting because of a very short angulated neck. She also has significant calcification in her iliac arteries. CT scan showed Bilobed infrarenal abdominal aortic aneurysm.  Superior component measures 5.0 x 5.1 cm. Inferior component  measures 3.2 x 3.1 cm. She was admitted for AAA open repair    Hospital Course:  Tymika MIKALA PODOLL is a 76 y.o. female is S/P Procedure(s): ANEURYSM ABDOMINAL AORTIC REPAIR AORTIC ENDARTERECETOMY Extubated: POD # 1 Post-op wounds healing well Pt. Ambulating, voiding and taking PO diet without difficulty. Pt pain controlled with PO pain meds. Labs as below Complications: pt remained intubated for Acute vent  dependent respiratory failure until 1st post-operative day but was successfully weaned without difficulty  Consults: CCM    Significant Diagnostic Studies: CBC Lab Results  Component Value Date   WBC 10.4 12/16/2011   HGB 10.9* 12/16/2011   HCT 33.9* 12/16/2011   MCV 89.9 12/16/2011   PLT 166 12/16/2011    BMET    Component Value Date/Time   NA 137 12/16/2011 0515   K 3.3* 12/16/2011 0515   CL 101 12/16/2011 0515   CO2 27 12/16/2011 0515   GLUCOSE 75 12/16/2011 0515   BUN 16 12/16/2011 0515   CREATININE 0.94 12/16/2011 0515   CREATININE 2.17* 11/11/2011 1018   CALCIUM 8.2* 12/16/2011 0515   GFRNONAA 59* 12/16/2011 0515   GFRAA 68* 12/16/2011 0515   COAG Lab Results  Component Value Date   INR 1.07 12/16/2011   INR 1.09 12/15/2011   INR 1.13 12/14/2011     Disposition:  Discharge to :Home Discharge Orders    Future Orders Please Complete By Expires   Resume previous diet      Driving Restrictions      Comments:   No driving for 6 weeks   Lifting restrictions      Comments:   No lifting for 6 weeks   Call MD for:  temperature >100.5      Call MD for:  redness, tenderness, or signs of infection (pain, swelling, bleeding, redness, odor or green/yellow discharge around incision site)      Call MD for:  severe or increased pain, loss or decreased feeling  in affected limb(s)      Increase activity slowly      Comments:   Walk with assistance use walker or cane as needed   Walk with assistance      May shower       No dressing needed      may wash over wound with mild soap and water      Scheduling Instructions:   Dry wound well   ABDOMINAL PROCEDURE/ANEURYSM REPAIR/AORTO-BIFEMORAL BYPASS:  Call MD for increased abdominal pain; cramping diarrhea; nausea/vomiting         Pace, [name]  Home Medication Instructions [id number]   Printed on:12/16/11 0827  Medication Information                    cholecalciferol (VITAMIN D) 1000 UNITS tablet Take 3,000 Units by mouth  daily.           pioglitazone (ACTOS) 45 MG tablet Take 45 mg by mouth daily.           hydrocortisone (CORTEF) 20 MG tablet Take 20 mg by mouth daily.           aspirin 81 MG tablet Take 160 mg by mouth daily.           clotrimazole-betamethasone (LOTRISONE) cream Apply topically 2 (two) times daily.           Cyanocobalamin 1000 MCG/15ML LIQD Take by mouth.           glucose monitoring kit (FREESTYLE) monitoring kit 1 each by Does not apply route as needed.           diltiazem (CARDIZEM CD) 240 MG 24 hr capsule Take 240 mg by mouth daily.           atenolol (TENORMIN) 50 MG tablet Take 50 mg by mouth daily.           triamterene-hydrochlorothiazide (MAXZIDE-25) 37.5-25 MG per tablet Take 1 tablet by mouth daily.           levothyroxine (SYNTHROID, LEVOTHROID) 75 MCG tablet Take 75 mcg by mouth daily.           rosuvastatin (CRESTOR) 5 MG tablet Take 5 mg by mouth daily.           alendronate (FOSAMAX) 70 MG tablet Take 70 mg by mouth every 7 (seven) days. Take with a full glass of water on an empty stomach.           amitriptyline (ELAVIL) 25 MG tablet Take 25 mg by mouth at bedtime.           traMADol (ULTRAM) 50 MG tablet Take 50 mg by mouth every 6 (six) hours as needed.           esomeprazole (NEXIUM) 40 MG capsule Take 40 mg by mouth daily before breakfast.           potassium citrate (UROCIT-K) 10 MEQ (1080 MG) SR tablet Take 10 mEq by mouth 3 (three) times daily with meals.           Somatropin (HUMATROPE) 5 MG SOLR Inject 5 mg as directed every evening. Inject 30 units every evening           oxyCODONE-acetaminophen (PERCOCET) 5-325 MG per tablet Take 1-2 tablets by mouth every 4 (four) hours as needed.            Verbal and written Discharge instructions given to the patient. Wound  care per Discharge AVS Follow-up Information    Follow up with Josephina Gip, MD in 2 weeks. (office will arrange - sent)    Contact information:   [address] [place] [address] [phone]          Signed: Marlowe Shores 12/16/2011, 8:27 AM

## 2011-12-18 NOTE — Discharge Summary (Signed)
Already done

## 2011-12-28 ENCOUNTER — Encounter: Payer: Self-pay | Admitting: Vascular Surgery

## 2011-12-28 ENCOUNTER — Ambulatory Visit (INDEPENDENT_AMBULATORY_CARE_PROVIDER_SITE_OTHER): Payer: Medicare Other | Admitting: Vascular Surgery

## 2011-12-28 VITALS — BP 133/68 | HR 74 | Resp 18 | Ht 64.0 in | Wt 150.0 lb

## 2011-12-28 DIAGNOSIS — I714 Abdominal aortic aneurysm, without rupture: Secondary | ICD-10-CM

## 2011-12-28 NOTE — Progress Notes (Signed)
Subjective:     Patient ID: Katie Pace, female   DOB: 1935-12-02, 76 y.o.   MRN: [id number]  HPI this 76 year old female returns almost 3 weeks post resection and grafting of large abdominal aortic aneurysm with insertion of aorto biiliac graft. This required endarterectomy of the infrarenal aorta which was heavily calcified. She has had increasing appetite and regular bowel movements since discharge. She is not having significant discomfort. She is increasing her ambulation. She has no specific complaints.    Review of Systems     Objective:   Physical ExamBP 133/68  Pulse 74  Resp 18  Ht 5\' 4"  (1.626 m)  Wt 150 lb (68.04 kg)  BMI 25.75 kg/m2   general well-developed well-nourished female in no apparent distress Alert and oriented x3 Abdomen soft nontender with well-healed midline incision skin staples removed 3+ femoral and popliteal pulses palpable bilaterally with well-perfused lower extremities      Assessment:     Doing well post resection and grafting of abdominal aortic aneurysm with insertion of aorto by iliac graft    Plan:     Continued to increase activity as tolerated Return to see me in 2 months with ABIs

## 2011-12-29 ENCOUNTER — Ambulatory Visit: Payer: Medicare Other | Admitting: Vascular Surgery

## 2012-01-15 ENCOUNTER — Telehealth: Payer: Self-pay

## 2012-01-15 NOTE — Telephone Encounter (Signed)
Phone call from pt. to report having dizziness with activity.  Denies any recent change in medication. States the dizziness has been going on for past couple of weeks.  Reports BP 130/60,pulse 68. States staying well-hydrated.  Denies any heart palpitations or sensation of heart racing.  S/p Resection and Grafting of AAA 12/09/11.  Denies any fever/chills, increased pain, or problems with incision.  Advised pt. needs to report dizziness to her PCP.  States she doesn't want to go back to her PCP, but may go to the Nurse Practitioner at same office.  Advised pt. needs to be evaluated for ongoing dizziness.  Stated she will make appt. through her PCP office.

## 2012-01-18 DIAGNOSIS — G609 Hereditary and idiopathic neuropathy, unspecified: Secondary | ICD-10-CM | POA: Diagnosis not present

## 2012-01-18 DIAGNOSIS — R42 Dizziness and giddiness: Secondary | ICD-10-CM | POA: Diagnosis not present

## 2012-02-08 ENCOUNTER — Observation Stay (HOSPITAL_COMMUNITY)
Admission: EM | Admit: 2012-02-08 | Discharge: 2012-02-10 | Disposition: A | Discharge status: Home / Self Care | Payer: Medicare Other | Source: Ambulatory Visit | Attending: Internal Medicine | Admitting: Internal Medicine

## 2012-02-08 ENCOUNTER — Emergency Department (HOSPITAL_COMMUNITY): Payer: Medicare Other

## 2012-02-08 ENCOUNTER — Encounter (HOSPITAL_COMMUNITY): Payer: Self-pay | Admitting: *Deleted

## 2012-02-08 DIAGNOSIS — K219 Gastro-esophageal reflux disease without esophagitis: Secondary | ICD-10-CM | POA: Insufficient documentation

## 2012-02-08 DIAGNOSIS — N289 Disorder of kidney and ureter, unspecified: Secondary | ICD-10-CM | POA: Diagnosis not present

## 2012-02-08 DIAGNOSIS — E039 Hypothyroidism, unspecified: Secondary | ICD-10-CM | POA: Insufficient documentation

## 2012-02-08 DIAGNOSIS — R079 Chest pain, unspecified: Principal | ICD-10-CM

## 2012-02-08 DIAGNOSIS — R072 Precordial pain: Secondary | ICD-10-CM | POA: Diagnosis not present

## 2012-02-08 DIAGNOSIS — M549 Dorsalgia, unspecified: Secondary | ICD-10-CM | POA: Diagnosis not present

## 2012-02-08 DIAGNOSIS — R0602 Shortness of breath: Secondary | ICD-10-CM | POA: Insufficient documentation

## 2012-02-08 DIAGNOSIS — E230 Hypopituitarism: Secondary | ICD-10-CM | POA: Diagnosis present

## 2012-02-08 DIAGNOSIS — R110 Nausea: Secondary | ICD-10-CM | POA: Diagnosis not present

## 2012-02-08 DIAGNOSIS — I714 Abdominal aortic aneurysm, without rupture: Secondary | ICD-10-CM

## 2012-02-08 DIAGNOSIS — Z8679 Personal history of other diseases of the circulatory system: Secondary | ICD-10-CM | POA: Insufficient documentation

## 2012-02-08 DIAGNOSIS — E119 Type 2 diabetes mellitus without complications: Secondary | ICD-10-CM

## 2012-02-08 DIAGNOSIS — I10 Essential (primary) hypertension: Secondary | ICD-10-CM

## 2012-02-08 HISTORY — DX: Abdominal aortic aneurysm, without rupture: I71.4

## 2012-02-08 HISTORY — DX: Hypopituitarism: E23.0

## 2012-02-08 HISTORY — DX: Peripheral vascular disease, unspecified: I73.9

## 2012-02-08 HISTORY — DX: Abdominal aortic aneurysm, without rupture, unspecified: I71.40

## 2012-02-08 HISTORY — DX: Occlusion and stenosis of left carotid artery: I65.22

## 2012-02-08 LAB — CBC
HCT: 34.2 % — ABNORMAL LOW (ref 36.0–46.0)
Hemoglobin: 11 g/dL — ABNORMAL LOW (ref 12.0–15.0)
MCH: 30.1 pg (ref 26.0–34.0)
MCV: 93.4 fL (ref 78.0–100.0)
RBC: 3.66 MIL/uL — ABNORMAL LOW (ref 3.87–5.11)

## 2012-02-08 LAB — COMPREHENSIVE METABOLIC PANEL
AST: 43 U/L — ABNORMAL HIGH (ref 0–37)
Albumin: 3.5 g/dL (ref 3.5–5.2)
Calcium: 8.6 mg/dL (ref 8.4–10.5)
Chloride: 100 mEq/L (ref 96–112)
Creatinine, Ser: 1.29 mg/dL — ABNORMAL HIGH (ref 0.50–1.10)
Total Protein: 6.5 g/dL (ref 6.0–8.3)

## 2012-02-08 LAB — CARDIAC PANEL(CRET KIN+CKTOT+MB+TROPI)
CK, MB: 1.4 ng/mL (ref 0.3–4.0)
CK, MB: 2 ng/mL (ref 0.3–4.0)
CK, MB: 2.4 ng/mL (ref 0.3–4.0)
Total CK: 35 U/L (ref 7–177)
Troponin I: <0.3 ng/mL (ref <0.30)

## 2012-02-08 LAB — GLUCOSE, CAPILLARY: Glucose-Capillary: 96 mg/dL (ref 70–99)

## 2012-02-08 LAB — DIFFERENTIAL
Eosinophils Absolute: 0.1 10*3/uL (ref 0.0–0.7)
Eosinophils Relative: 1 % (ref 0–5)
Lymphs Abs: 1.7 10*3/uL (ref 0.7–4.0)
Monocytes Relative: 11 % (ref 3–12)

## 2012-02-08 MED ORDER — SENNA 8.6 MG PO TABS
1.0000 | ORAL_TABLET | Freq: Two times a day (BID) | ORAL | Status: DC
  Administered 2012-02-08 – 2012-02-09 (×3): 8.6 mg via ORAL
  Filled 2012-02-08 (×6): qty 1

## 2012-02-08 MED ORDER — HYDROCORTISONE 20 MG PO TABS
20.0000 mg | ORAL_TABLET | Freq: Every day | ORAL | Status: DC
  Administered 2012-02-08 – 2012-02-10 (×3): 20 mg via ORAL
  Filled 2012-02-08 (×4): qty 1

## 2012-02-08 MED ORDER — HYDROCORTISONE 10 MG PO TABS: 10.0000 mg | ORAL_TABLET | Freq: Every day | ORAL | Status: DC

## 2012-02-08 MED ORDER — SODIUM CHLORIDE 0.9 % IJ SOLN: 3.0000 mL | INTRAMUSCULAR | Status: DC | PRN

## 2012-02-08 MED ORDER — ASPIRIN EC 325 MG PO TBEC
325.0000 mg | DELAYED_RELEASE_TABLET | Freq: Every day | ORAL | Status: DC
Start: 2012-02-08 — End: 2012-02-10
  Administered 2012-02-08 – 2012-02-10 (×3): 325 mg via ORAL
  Filled 2012-02-08 (×3): qty 1

## 2012-02-08 MED ORDER — SODIUM CHLORIDE 0.9 % IV SOLN: 250.0000 mL | INTRAVENOUS | Status: DC | PRN

## 2012-02-08 MED ORDER — ACETAMINOPHEN 650 MG RE SUPP: 650.0000 mg | Freq: Four times a day (QID) | RECTAL | Status: DC | PRN

## 2012-02-08 MED ORDER — NITROGLYCERIN 0.4 MG SL SUBL
0.4000 mg | SUBLINGUAL_TABLET | SUBLINGUAL | Status: DC | PRN
  Administered 2012-02-08 (×2): 0.4 mg via SUBLINGUAL
  Filled 2012-02-08: qty 25

## 2012-02-08 MED ORDER — MORPHINE SULFATE 2 MG/ML IJ SOLN
2.0000 mg | Freq: Once | INTRAMUSCULAR | Status: AC
Start: 2012-02-08 — End: 2012-02-08
  Administered 2012-02-08: 2 mg via INTRAVENOUS
  Filled 2012-02-08: qty 1

## 2012-02-08 MED ORDER — ACETAMINOPHEN 325 MG PO TABS
650.0000 mg | ORAL_TABLET | Freq: Four times a day (QID) | ORAL | Status: DC | PRN
  Administered 2012-02-09 – 2012-02-10 (×4): 650 mg via ORAL
  Filled 2012-02-08 (×4): qty 2

## 2012-02-08 MED ORDER — ENOXAPARIN SODIUM 40 MG/0.4ML ~~LOC~~ SOLN
40.0000 mg | SUBCUTANEOUS | Status: DC
  Administered 2012-02-08 – 2012-02-10 (×2): 40 mg via SUBCUTANEOUS
  Filled 2012-02-08 (×3): qty 0.4

## 2012-02-08 MED ORDER — DILTIAZEM HCL ER COATED BEADS 240 MG PO CP24
240.0000 mg | ORAL_CAPSULE | Freq: Every day | ORAL | Status: DC
  Administered 2012-02-08 – 2012-02-10 (×3): 240 mg via ORAL
  Filled 2012-02-08 (×3): qty 1

## 2012-02-08 MED ORDER — PIOGLITAZONE HCL 45 MG PO TABS
45.0000 mg | ORAL_TABLET | Freq: Every day | ORAL | Status: DC
  Administered 2012-02-08 – 2012-02-09 (×2): 45 mg via ORAL
  Filled 2012-02-08 (×3): qty 1

## 2012-02-08 MED ORDER — MORPHINE SULFATE 2 MG/ML IJ SOLN
2.0000 mg | INTRAMUSCULAR | Status: DC | PRN
  Administered 2012-02-08 (×3): 1 mg via INTRAVENOUS
  Filled 2012-02-08 (×5): qty 1

## 2012-02-08 MED ORDER — HYDRALAZINE HCL 20 MG/ML IJ SOLN
5.0000 mg | INTRAMUSCULAR | Status: DC | PRN
  Administered 2012-02-08: 5 mg via INTRAVENOUS
  Filled 2012-02-08: qty 1

## 2012-02-08 MED ORDER — ATENOLOL 50 MG PO TABS
50.0000 mg | ORAL_TABLET | Freq: Every day | ORAL | Status: DC
  Administered 2012-02-08 – 2012-02-10 (×3): 50 mg via ORAL
  Filled 2012-02-08 (×3): qty 1

## 2012-02-08 MED ORDER — AMITRIPTYLINE HCL 25 MG PO TABS
25.0000 mg | ORAL_TABLET | Freq: Every day | ORAL | Status: DC
  Administered 2012-02-08 – 2012-02-09 (×2): 25 mg via ORAL
  Filled 2012-02-08 (×3): qty 1

## 2012-02-08 MED ORDER — INSULIN ASPART 100 UNIT/ML ~~LOC~~ SOLN: 0.0000 [IU] | Freq: Every day | SUBCUTANEOUS | Status: DC

## 2012-02-08 MED ORDER — ATORVASTATIN CALCIUM 10 MG PO TABS
10.0000 mg | ORAL_TABLET | Freq: Every day | ORAL | Status: DC
  Administered 2012-02-08 – 2012-02-09 (×2): 10 mg via ORAL
  Filled 2012-02-08 (×3): qty 1

## 2012-02-08 MED ORDER — ONDANSETRON HCL 4 MG/2ML IJ SOLN
4.0000 mg | Freq: Four times a day (QID) | INTRAMUSCULAR | Status: DC | PRN
  Administered 2012-02-10: 4 mg via INTRAVENOUS
  Filled 2012-02-08: qty 2

## 2012-02-08 MED ORDER — SODIUM CHLORIDE 0.9 % IV SOLN
Freq: Once | INTRAVENOUS | Status: AC
  Administered 2012-02-08: 06:00:00 via INTRAVENOUS

## 2012-02-08 MED ORDER — ASPIRIN 81 MG PO CHEW
324.0000 mg | CHEWABLE_TABLET | Freq: Once | ORAL | Status: AC
  Administered 2012-02-08: 324 mg via ORAL
  Filled 2012-02-08: qty 4

## 2012-02-08 MED ORDER — LEVOTHYROXINE SODIUM 75 MCG PO TABS
75.0000 ug | ORAL_TABLET | Freq: Every day | ORAL | Status: DC
  Administered 2012-02-08 – 2012-02-10 (×3): 75 ug via ORAL
  Filled 2012-02-08 (×5): qty 1

## 2012-02-08 MED ORDER — DOCUSATE SODIUM 100 MG PO CAPS
100.0000 mg | ORAL_CAPSULE | Freq: Two times a day (BID) | ORAL | Status: DC
  Administered 2012-02-08 – 2012-02-10 (×5): 100 mg via ORAL
  Filled 2012-02-08 (×6): qty 1

## 2012-02-08 MED ORDER — ONDANSETRON HCL 4 MG/2ML IJ SOLN
4.0000 mg | Freq: Once | INTRAMUSCULAR | Status: AC
  Administered 2012-02-08: 4 mg via INTRAVENOUS
  Filled 2012-02-08: qty 2

## 2012-02-08 MED ORDER — POLYETHYLENE GLYCOL 3350 17 G PO PACK: 17.0000 g | PACK | Freq: Every day | ORAL | Status: DC | PRN

## 2012-02-08 MED ORDER — SODIUM CHLORIDE 0.9 % IJ SOLN
3.0000 mL | Freq: Two times a day (BID) | INTRAMUSCULAR | Status: DC
  Administered 2012-02-08 – 2012-02-10 (×3): 3 mL via INTRAVENOUS

## 2012-02-08 MED ORDER — INSULIN ASPART 100 UNIT/ML ~~LOC~~ SOLN: 0.0000 [IU] | Freq: Three times a day (TID) | SUBCUTANEOUS | Status: DC

## 2012-02-08 MED ORDER — SOMATROPIN 5 MG IJ SOLR
0.3000 mg | Freq: Every evening | INTRAMUSCULAR | Status: DC
  Administered 2012-02-08 – 2012-02-09 (×2): 0.3 mg via INTRAMUSCULAR
  Filled 2012-02-08 (×3): qty 1

## 2012-02-08 MED ORDER — ONDANSETRON HCL 4 MG PO TABS
4.0000 mg | ORAL_TABLET | Freq: Four times a day (QID) | ORAL | Status: DC | PRN
  Administered 2012-02-09: 4 mg via ORAL
  Filled 2012-02-08: qty 1

## 2012-02-08 NOTE — Consult Note (Signed)
CARDIOLOGY CONSULT NOTE  Patient ID: Katie Pace, MRN: [id number], DOB/AGE: Apr 05, 1936 76 y.o. Admit date: 02/08/2012 Date of Consult: 02/08/2012  Primary Physician: Claude Manges, FNP, FNP Primary Cardiologist: New; Prior cardiologist was Dr. Elsie Lincoln before he retired.  Chief Complaint: Chest pain Reason for Consultation: Chest Pain  HPI: 77 y.o. female w/ PMHx significant for AAA s/p repair 12/2011, Carotid dz s/p L CEA '09, HTN, DM, GERD, CVA, and hypothyroidism who presented to [facility] on 02/08/2012 with complaints of chest pain.  Last cath 2009 done for preop eval showed 30% ostial narrowing of the first diagonal branch of the LAD, otherwise normal coronary arteries, normal LV systolic function, EF 55-65%, normal right heart pressures. She had been experiencing nausea and vomiting and was having a GI workup with Dr. Evette Cristal including a negative upper endoscopy and upper GI series. Abd [place] & CT abd/pelvis were completed in January of this year and revealed a 5.1cm AAA. She was seen by Dr. Hart Rochester who ordered a Lexiscan Myoview for preop eval in February which revealed a small inferolateral distal fixed defect, EF 77% and normal wall motion. She underwent AAA repair on 12/09/11 and was doing well until last week while lying in bed she experienced substernal chest pressure 6/10 that radiated to her back and was associated with nausea. It lasted about 30-66mins and resolved. She has h/o GERD but says this pain was different. The pain recurred last night while lying in bed. It was similar in nature, but more severe so she presented to the ED. She is not very active due to musculoskeletal complaints (back/foot pain), but does some walking at home without complaints of sob or chest pain. She has not done any heavy lifting or had any injuries. She denies recent illness, fever, chills, cough, sob, palpitations, syncope, orthopnea, edema, change in bladder or bowels, or calf pain.  In the ED, EKG had  no acute ischemic changes, poc troponin was normal, CXR showed mild vascular congestion, but otherwise no acute findings. H&H stable. WBC and LFTs grossly normal. Crt 1.29. She was not febrile, hypoxic or tachycardic. Chest pain was relieved with SL NTG and morphine, although she did have subsequent hypotension. She was admitted by medicine for further evaluation and treatment. Cardiac enzymes are neg x 1. She had recurrence of chest pain this morning relieved with IV morphine. Cardiac enzymes are being cycled and Echo is pending.   Past Medical History  Diagnosis Date  . Hypertension   . Hypothyroidism   . Stroke     Ischemic x 2       last one 2007  . Diabetes mellitus     type 11  . Endocrine disturbance     gland has not  worked until 1970.Marland Kitchen..dx in 1977  . Blood transfusion     back in 1973  . GERD (gastroesophageal reflux disease)     on meds to help....meds do well   . Sheehan syndrome 1977  . AAA (abdominal aortic aneurysm)     S/p aortic repair and endarterectomy 12/2011   . Carotid stenosis, left     S/p left CEA 09/2008  . PVD (peripheral vascular disease)      08/15/08 - R/L Cardiac Cath Hemodynamics: Right atrial pressure 6/3, end-diastolic pressure 3; right ventricular pressure 32/5, end-diastolic pressure 7; pulmonary capillary wedge pressure of 12; A-wave 14; V-wave 10; central aortic pressure 174/6; central aortic pressure 171/73; pulmonary artery  saturation 70%; central aorta saturation 93. No pullback pressure  across the central aorta was reported.  ANGIOGRAPHIC RESULTS:  The patient has a markedly dominant left circumflex coronary artery system, which gives rise to a very large posterior descending branch and 2 posterolateral branches coming off of a common trunk off of the midcircumflex. No lesions are seen in that vessel.  Left main coronary artery is normal.  Left anterior descending coronary artery gives rise to 2 diagonal branches and 1 major septal perforator  branch. LAD is normal. Septal perforator is normal. The first diagonal branch contains a stenosis of 30% proximally. The second diagonal branch, which bifurcates is normal in appearance.  The right coronary artery is small and nondominant with thread-like coronary arteries that are normal, and as previously stated, it is nondominant.  Left ventricular angiogram shows vigorous contractility of all wall segments. EF is estimated at 55-60%, and there is no mitral  regurgitation. Fick cardiac output was 6.3. Fick cardiac index was 3.5.  FINAL IMPRESSIONS:  1. Angiographically, patent coronary arteries with only a 30% ostial narrowing of the first diagonal branch of the left anterior descending. The remainder of the coronary arteries were normal.  2. Normal left ventricular systolic function, ejection fraction of 55-60 up to 65%.  3. Normal right heart pressures.  4. Elevated blood pressure, treated with labetalol on one or two occasions.   Surgical History:  Past Surgical History  Procedure Date  . Spine surgery     Lumbar Diskectomy  . Joint replacement May 2009    Right knee  . Joint replacement April 2008    Right Hip prothesis  . Carotid endarterectomy Dec. 2009  . Cardiac catheterization     1980  &  2009  . Appendectomy     1960  . Cesarean section   . Back surgery     1990's  . Abdominal hysterectomy 1973    1973  . Fracture surgery     left ankle   . Abdominal aortic aneurysm repair 12/09/2011    Procedure: ANEURYSM ABDOMINAL AORTIC REPAIR;  Surgeon: Pryor Ochoa, MD;  Location: [facility];  Service: Vascular;  Laterality: N/A;  resection and grafting abdominal aortic aneurysm; Insertion Bilateral External Iliac Graft; Reexploration Proximal Anastemosis; Aortic Endarterectomy; Reimplantation Inferior Mesenteric Artery; Intraaortic Graft     Home Meds: Medication Sig  alendronate (FOSAMAX) 70 MG tablet Take 70 mg by mouth every 7 (seven) days. Take with a full glass of water on an  empty stomach.  amitriptyline (ELAVIL) 25 MG tablet Take 25 mg by mouth at bedtime.  aspirin 81 MG tablet Take 160 mg by mouth daily.  atenolol (TENORMIN) 50 MG tablet Take 50 mg by mouth daily.  cholecalciferol (VITAMIN D) 1000 UNITS tablet Take 3,000 Units by mouth daily.  clotrimazole-betamethasone (LOTRISONE) cream Apply topically 2 (two) times daily.  Cyanocobalamin 1000 MCG/15ML LIQD Take by mouth.  diltiazem (CARDIZEM CD) 240 MG 24 hr capsule Take 240 mg by mouth daily.  esomeprazole (NEXIUM) 40 MG capsule Take 40 mg by mouth daily before breakfast.  glucose monitoring kit (FREESTYLE) monitoring kit 1 each by Does not apply route as needed.  hydrocortisone (CORTEF) 20 MG tablet Take 10-20 mg by mouth daily. 20 mg in the morning and 10 mg in the evening  levothyroxine (SYNTHROID, LEVOTHROID) 75 MCG tablet Take 75 mcg by mouth daily.  pioglitazone (ACTOS) 45 MG tablet Take 45 mg by mouth daily.  potassium chloride SA (K-DUR,KLOR-CON) 20 MEQ tablet Take 1 tablet (20 mEq total) by mouth 2 (two) times  daily.  rosuvastatin (CRESTOR) 5 MG tablet Take 5 mg by mouth daily.  Somatropin (HUMATROPE) 5 MG SOLR Inject 5 mg as directed every evening. Inject 30 units every evening  traMADol (ULTRAM) 50 MG tablet Take 50 mg by mouth every 6 (six) hours as needed.  triamterene-hydrochlorothiazide (MAXZIDE-25) 37.5-25 MG per tablet Take 1 tablet by mouth daily.    Inpatient Medications:   . amitriptyline  25 mg Oral QHS  . aspirin  324 mg Oral Once  . aspirin EC  325 mg Oral Daily  . atenolol  50 mg Oral Daily  . atorvastatin  10 mg Oral q1800  . diltiazem  240 mg Oral Daily  . docusate sodium  100 mg Oral BID  . enoxaparin  40 mg Subcutaneous Q24H  . hydrocortisone  20 mg Oral Daily  . levothyroxine  75 mcg Oral QAC breakfast  .  morphine injection  2 mg Intravenous Once  . ondansetron  4 mg Intravenous Once  . pioglitazone  45 mg Oral Daily  . senna  1 tablet Oral BID  . sodium chloride  3  mL Intravenous Q12H  . Somatropin  5 mg Injection QPM    Allergies:  Allergies  Allergen Reactions  . Codeine Nausea Only  . Dilaudid (Hydromorphone Hcl) Nausea Only  . Lincomycin Hcl Other (See Comments)    Big bumps bilateral legs  . Macrodantin Hives  . Zolpidem Tartrate Other (See Comments)    Stroke  . Azithromycin Rash  . Penicillins Rash   Social History  . Marital Status: Married    Spouse Name: N/A    Number of Children: N/A   Occupational History  . Retired Engineer, civil (consulting)   Social History Main Topics  . Smoking status: Former Smoker -- 4 years    Types: Cigarettes    Quit date: 10/06/1963  . Smokeless tobacco: Never Used  . Alcohol Use: No  . Drug Use: No   Family History  Problem Relation Age of Onset  . Heart disease Mother   . Peripheral vascular disease Mother   . Alcohol abuse Father   . Heart disease Sister   . Heart disease Brother      Review of Systems: General: negative for chills, fever, night sweats or weight changes.  Cardiovascular: (+) chest pain; negative for shortness of breath, dyspnea on exertion, edema, orthopnea, palpitations, or paroxysmal nocturnal dyspnea Dermatological: negative for rash Respiratory: negative for cough or wheezing Urologic: negative for hematuria Abdominal: (+) nausea; negative for vomiting, diarrhea, bright red blood per rectum, melena, or hematemesis Neurologic: negative for visual changes, syncope, or dizziness All other systems reviewed and are otherwise negative except as noted above.  Labs:  [facility] 02/08/12 0700  CKTOTAL 26  CKMB 1.4  TROPONINI <0.30   Component Value Date   WBC 7.3 02/08/2012   HGB 11.0* 02/08/2012   HCT 34.2* 02/08/2012   MCV 93.4 02/08/2012   PLT 179 02/08/2012    Lab 02/08/12 0046  NA 137  K 3.8  CL 100  CO2 26  BUN 32*  CREATININE 1.29*  CALCIUM 8.6  PROT 6.5  BILITOT 0.3  ALKPHOS 82  ALT 22  AST 43*  GLUCOSE 123*   Radiology/Studies:   02/08/2012 - CXR  Findings: The lungs  are well-aerated.  Mild vascular congestion is noted.  There is no evidence of focal opacification, pleural effusion or pneumothorax.  The cardiomediastinal silhouette is borderline normal in size.  No acute osseous abnormalities are seen.  IMPRESSION:  Mild vascular congestion noted; lungs remain grossly clear.   EKG: 02/08/12 @ 0704 - NSR 74 bpm, no acute ST/T changes  Physical Exam: Blood pressure 167/71, pulse 75, temperature 97.9 F (36.6 C), temperature source Oral, resp. rate 20, height 5\' 4"  (1.626 m), weight 151 lb 7.3 oz (68.7 kg), SpO2 95.00%. General: Overweight elderly white female, in no acute distress. Somewhat anxious. Head: Normocephalic, atraumatic, sclera non-icteric, no xanthomas, nares are without discharge.  Neck: Supple. Left-sided scar. Negative for carotid bruits. JVD not elevated. Lungs: Clear bilaterally to auscultation without wheezes, rales, or rhonchi. Breathing is unlabored. Heart: RRR with S1 S2. 2/6 systolic murmur R/LUSB. No rubs or gallops appreciated. Abdomen: Midline scar. Soft, non-tender, non-distended with normoactive bowel sounds. No hepatomegaly. No rebound/guarding. No obvious abdominal masses. Msk:  Muscular atrophy to BLE. Extremities: No calf pain or swelling, no palpable cord. No clubbing or cyanosis. No edema.  Distal pedal pulses are 2+ and equal bilaterally. Neuro: Alert and oriented X 3. Moves all extremities spontaneously. Psych:  Responds to questions appropriately with a normal affect.   Assessment and Plan:  76 y.o. female w/ PMHx significant for AAA s/p repair 12/2011, Carotid dz s/p L CEA '09, HTN, DM, GERD, CVA, and hypothyroidism who presented to [facility] on 02/08/2012 with complaints of chest pain.  See MD note for full assessment and plan  Signed, Arabia Nylund PA-C 02/08/2012, 11:03 AM  Patient seen with PA, agree with the above note.  She had 1 episode last week and one episode last night (both times while lying in bed) of  aching chest pain radiating to the mid back.  First episode lasted about an hour, second episode lasted several hours.  Cardiac enzymes so far negative.  ECG without significant changes.  She had a AAA repair in 3/13.  Pre-op myoview was reportedly normal but cannot find the actual myoview report.  She is not sure where it was done.   - If she rules out for MI, has no further chest pain, and we can find her myoview report (and it was normal): she can be discharged to followup as outpatient with cardiology.   - If she has further chest pain episodes while in the hospital, would plan dobutamine stress echo (does not think she could walk on a treadmill and would hold off on giving her contrast for coronary CT angiogram with GFR 40 ml/min).    Marca Ancona 02/08/2012 12:07 PM  (Myoview results can be found in chart review --> media)

## 2012-02-08 NOTE — ED Notes (Signed)
Lab finished at [facility], pharm tech at [facility], EKG done.

## 2012-02-08 NOTE — Progress Notes (Signed)
  Echocardiogram 2D Echocardiogram has been performed.  Katie Pace [facility] 02/08/2012, 10:24 AM

## 2012-02-08 NOTE — ED Provider Notes (Addendum)
History     CSN: [id number]  Arrival date & time 02/08/12  0041   First MD Initiated Contact with Patient 02/08/12 0104      Chief Complaint  Patient presents with  . Chest Pain    (Consider location/radiation/quality/duration/timing/severity/associated sxs/prior treatment) HPI 76 year old female presents to emergency department with complaint of chest pain. Patient reports onset of pain around 11:15. Patient describes pain as central substernal radiating through to her back. Patient describes pain as a squeezing and pressure. Pain lasted about 30 minutes. Pain was associated with shortness of breath, and nausea. She denies vomiting or diaphoresis. Patient reports similar episode last week lasting 15-20 minutes which resolved on its own. Patient with complicated past medical history, recently had AAA repair in March. Patient with history of 2 strokes is on aspirin and Aggrenox. Patient with strong family history of coronary disease with multiple siblings and mother all with stents and/or heart surgery. Patient reports she had a negative Myoview prior to surgery being done in March. She reports she still has some pain currently but nowhere near as severe as the initial onset Past Medical History  Diagnosis Date  . Hypertension   . Hypothyroidism   . Stroke     Ischemic x 2       last one 2007  . Diabetes mellitus     type 11  . Endocrine disturbance     gland has not  worked until 1970.Marland Kitchen..dx in 1977  . Blood transfusion     back in 1973  . GERD (gastroesophageal reflux disease)     on meds to help....meds do well     Past Surgical History  Procedure Date  . Spine surgery     Lumbar Diskectomy  . Joint replacement May 2009    Right knee  . Joint replacement April 2008    Right Hip prothesis  . Carotid endarterectomy Dec. 2009  . Cardiac catheterization     1980  &  2009  . Appendectomy     1960  . Cesarean section   . Back surgery     1990's  . Abdominal hysterectomy  1973    1973  . Fracture surgery     left ankle   . Abdominal aortic aneurysm repair 12/09/2011    Procedure: ANEURYSM ABDOMINAL AORTIC REPAIR;  Surgeon: Pryor Ochoa, MD;  Location: [facility] OR;  Service: Vascular;  Laterality: N/A;  resection and grafting abdominal aortic aneurysm; Insertion Bilateral External Iliac Graft; Reexploration Proximal Anastemosis; Aortic Endarterectomy; Reimplantation Inferior Mesenteric Artery; Intraaortic Graft    Family History  Problem Relation Age of Onset  . Heart disease Mother   . Peripheral vascular disease Mother   . Alcohol abuse Father   . Heart disease Sister   . Heart disease Brother     History  Substance Use Topics  . Smoking status: Former Smoker -- 4 years    Types: Cigarettes    Quit date: 10/06/1963  . Smokeless tobacco: Never Used  . Alcohol Use: No    OB History    Grav Para Term Preterm Abortions TAB SAB Ect Mult Living                  Review of Systems  All other systems reviewed and are negative.    Allergies  Codeine; Dilaudid; Lincomycin hcl; Macrodantin; Zolpidem tartrate; Azithromycin; and Penicillins  Home Medications   Current Outpatient Rx  Name Route Sig Dispense Refill  . ALENDRONATE SODIUM 70 MG PO  TABS Oral Take 70 mg by mouth every 7 (seven) days. Take with a full glass of water on an empty stomach.    . AMITRIPTYLINE HCL 25 MG PO TABS Oral Take 25 mg by mouth at bedtime.    . ASPIRIN 81 MG PO TABS Oral Take 160 mg by mouth daily.    . ATENOLOL 50 MG PO TABS Oral Take 50 mg by mouth daily.    Marland Kitchen VITAMIN D 1000 UNITS PO TABS Oral Take 3,000 Units by mouth daily.    Marland Kitchen CLOTRIMAZOLE-BETAMETHASONE 1-0.05 % EX CREA Topical Apply topically 2 (two) times daily.    . CYANOCOBALAMIN 1000 MCG/15ML PO LIQD Oral Take by mouth.    Marland Kitchen DILTIAZEM HCL ER COATED BEADS 240 MG PO CP24 Oral Take 240 mg by mouth daily.    Marland Kitchen ESOMEPRAZOLE MAGNESIUM 40 MG PO CPDR Oral Take 40 mg by mouth daily before breakfast.    . FREESTYLE  SYSTEM KIT Does not apply 1 each by Does not apply route as needed.    Marland Kitchen HYDROCORTISONE 20 MG PO TABS Oral Take 20 mg by mouth daily.    Marland Kitchen LEVOTHYROXINE SODIUM 75 MCG PO TABS Oral Take 75 mcg by mouth daily.    Marland Kitchen PIOGLITAZONE HCL 45 MG PO TABS Oral Take 45 mg by mouth daily.    Marland Kitchen POTASSIUM CHLORIDE CRYS ER 20 MEQ PO TBCR Oral Take 1 tablet (20 mEq total) by mouth 2 (two) times daily. 60 tablet 0  . ROSUVASTATIN CALCIUM 5 MG PO TABS Oral Take 5 mg by mouth daily.    . SOMATROPIN 5 MG IJ SOLR Injection Inject 5 mg as directed every evening. Inject 30 units every evening    . TRAMADOL HCL 50 MG PO TABS Oral Take 50 mg by mouth every 6 (six) hours as needed.    . TRIAMTERENE-HCTZ 37.5-25 MG PO TABS Oral Take 1 tablet by mouth daily.      BP 164/61  Pulse 71  Temp(Src) 97.9 F (36.6 C) (Oral)  Resp 22  SpO2 100%  Physical Exam  Constitutional: She appears well-developed and well-nourished. No distress.       Frail female in no acute distress  HENT:  Head: Normocephalic and atraumatic.  Right Ear: External ear normal.  Left Ear: External ear normal.  Neck: Normal range of motion. Neck supple. No JVD present. No tracheal deviation present. No thyromegaly present.  Cardiovascular: Normal rate, regular rhythm, normal heart sounds and intact distal pulses.  Exam reveals no gallop and no friction rub.   No murmur heard. Pulmonary/Chest: Effort normal and breath sounds normal. No stridor. No respiratory distress. She has no wheezes. She has no rales. She exhibits no tenderness.  Abdominal: Soft. Bowel sounds are normal. She exhibits no distension and no mass. There is no tenderness. There is no rebound and no guarding.       Surgical scar well-healed  Musculoskeletal: Normal range of motion. She exhibits no edema and no tenderness.  Lymphadenopathy:    She has no cervical adenopathy.  Skin: Skin is warm and dry. No rash noted. No erythema. No pallor.    ED Course  Procedures (including  critical care time)  Labs Reviewed  COMPREHENSIVE METABOLIC PANEL - Abnormal; Notable for the following:    Glucose, Bld 123 (*)    BUN 32 (*)    Creatinine, Ser 1.29 (*)    AST 43 (*)    GFR calc non Af Amer 40 (*)  GFR calc Af Amer 46 (*)    All other components within normal limits  CBC - Abnormal; Notable for the following:    RBC 3.66 (*)    Hemoglobin 11.0 (*)    HCT 34.2 (*)    RDW 17.8 (*)    All other components within normal limits  DIFFERENTIAL  POCT I-STAT TROPONIN I   Dg Chest Portable 1 View  02/08/2012  *RADIOLOGY REPORT*  Clinical Data: Chest pain.  PORTABLE CHEST - 1 VIEW  Comparison: Chest radiograph performed 12/10/2011  Findings: The lungs are well-aerated.  Mild vascular congestion is noted.  There is no evidence of focal opacification, pleural effusion or pneumothorax.  The cardiomediastinal silhouette is borderline normal in size.  No acute osseous abnormalities are seen.  IMPRESSION: Mild vascular congestion noted; lungs remain grossly clear.  Original Report Authenticated By: Tonia Ghent, M.D.    Date: 02/08/2012  Rate: 76  Rhythm: normal sinus rhythm  QRS Axis: normal  Intervals: normal  ST/T Wave abnormalities: normal  Conduction Disutrbances:none  Narrative Interpretation:   Old EKG Reviewed: unchanged     1. Chest pain       MDM  76 year old female with strong family history of coronary disease, history of hypertension diabetes hyperlipidemia who by her history has had issues with multiple arteries with arteriosclerosis. Concern for ruptured plaque leading to unstable angina. Feel patient will require admission. Will discuss with hospitalist for admission with cardiology consult        Olivia Mackie, MD 02/08/12 0215  3:11 AM Patient with drop in blood pressure after second nitroglycerin. Patient symptomatic, reported nausea. Dizziness. Symptoms resolved after small fluid bolus blood pressure now normalized. Still with some very  mild chest pain. Discussed possible for admission we'll give dose of morphine.  Olivia Mackie, MD 02/08/12 [phone]

## 2012-02-08 NOTE — Progress Notes (Signed)
Pt complaining of 6/10 chest pain. Pt declines sublingual nitro at this time. EKG performed with no apparent changes.  Pt given 1 mg IV morphine. PA notified. Will continue to monitor. Efraim Kaufmann

## 2012-02-08 NOTE — Progress Notes (Signed)
Subjective: Chest pain is intermittent.  Objective: Weight change:  No intake or output data in the 24 hours ending 02/08/12 1045  Filed Vitals:   02/08/12 0924  BP: 167/71  Pulse: 75  Temp:   Resp:    On exam  She is alert Afebrile Comfortable CVS s1s2 heard no MRG RRR Lungs: clear Abdomen: soft NT ND BS+ Extremities: trace pedal edema.   Lab Results: Results for orders placed during the hospital encounter of 02/08/12 (from the past 24 hour(s))  COMPREHENSIVE METABOLIC PANEL     Status: Abnormal   Collection Time   02/08/12 12:46 AM      Component Value Range   Sodium 137  135 - 145 (mEq/L)   Potassium 3.8  3.5 - 5.1 (mEq/L)   Chloride 100  96 - 112 (mEq/L)   CO2 26  19 - 32 (mEq/L)   Glucose, Bld 123 (*) 70 - 99 (mg/dL)   BUN 32 (*) 6 - 23 (mg/dL)   Creatinine, Ser 8.29 (*) 0.50 - 1.10 (mg/dL)   Calcium 8.6  8.4 - 56.2 (mg/dL)   Total Protein 6.5  6.0 - 8.3 (g/dL)   Albumin 3.5  3.5 - 5.2 (g/dL)   AST 43 (*) 0 - 37 (U/L)   ALT 22  0 - 35 (U/L)   Alkaline Phosphatase 82  39 - 117 (U/L)   Total Bilirubin 0.3  0.3 - 1.2 (mg/dL)   GFR calc non Af Amer 40 (*) >90 (mL/min)   GFR calc Af Amer 46 (*) >90 (mL/min)  CBC     Status: Abnormal   Collection Time   02/08/12 12:46 AM      Component Value Range   WBC 7.3  4.0 - 10.5 (K/uL)   RBC 3.66 (*) 3.87 - 5.11 (MIL/uL)   Hemoglobin 11.0 (*) 12.0 - 15.0 (g/dL)   HCT 13.0 (*) 86.5 - 46.0 (%)   MCV 93.4  78.0 - 100.0 (fL)   MCH 30.1  26.0 - 34.0 (pg)   MCHC 32.2  30.0 - 36.0 (g/dL)   RDW 78.4 (*) 69.6 - 15.5 (%)   Platelets 179  150 - 400 (K/uL)  DIFFERENTIAL     Status: Normal   Collection Time   02/08/12 12:46 AM      Component Value Range   Neutrophils Relative 65  43 - 77 (%)   Neutro Abs 4.7  1.7 - 7.7 (K/uL)   Lymphocytes Relative 23  12 - 46 (%)   Lymphs Abs 1.7  0.7 - 4.0 (K/uL)   Monocytes Relative 11  3 - 12 (%)   Monocytes Absolute 0.8  0.1 - 1.0 (K/uL)   Eosinophils Relative 1  0 - 5 (%)   Eosinophils  Absolute 0.1  0.0 - 0.7 (K/uL)   Basophils Relative 0  0 - 1 (%)   Basophils Absolute 0.0  0.0 - 0.1 (K/uL)  POCT I-STAT TROPONIN I     Status: Normal   Collection Time   02/08/12  1:14 AM      Component Value Range   Troponin i, poc 0.00  0.00 - 0.08 (ng/mL)   Comment 3           GLUCOSE, CAPILLARY     Status: Abnormal   Collection Time   02/08/12  4:19 AM      Component Value Range   Glucose-Capillary 117 (*) 70 - 99 (mg/dL)  CARDIAC PANEL(CRET KIN+CKTOT+MB+TROPI)     Status: Normal   Collection  Time   02/08/12  7:00 AM      Component Value Range   Total CK 26  7 - 177 (U/L)   CK, MB 1.4  0.3 - 4.0 (ng/mL)   Troponin I <0.30  <0.30 (ng/mL)   Relative Index RELATIVE INDEX IS INVALID  0.0 - 2.5   GLUCOSE, CAPILLARY     Status: Normal   Collection Time   02/08/12  7:33 AM      Component Value Range   Glucose-Capillary 96  70 - 99 (mg/dL)  GLUCOSE, CAPILLARY     Status: Normal   Collection Time   02/08/12 11:51 AM      Component Value Range   Glucose-Capillary 88  70 - 99 (mg/dL)  CARDIAC PANEL(CRET KIN+CKTOT+MB+TROPI)     Status: Normal   Collection Time   02/08/12 12:43 PM      Component Value Range   Total CK 32  7 - 177 (U/L)   CK, MB 2.0  0.3 - 4.0 (ng/mL)   Troponin I <0.30  <0.30 (ng/mL)   Relative Index RELATIVE INDEX IS INVALID  0.0 - 2.5   GLUCOSE, CAPILLARY     Status: Normal   Collection Time   02/08/12  4:37 PM      Component Value Range   Glucose-Capillary 92  70 - 99 (mg/dL)     Micro Results: No results found for this or any previous visit (from the past 240 hour(s)).  Studies/Results: Dg Chest Portable 1 View  02/08/2012  *RADIOLOGY REPORT*  Clinical Data: Chest pain.  PORTABLE CHEST - 1 VIEW  Comparison: Chest radiograph performed 12/10/2011  Findings: The lungs are well-aerated.  Mild vascular congestion is noted.  There is no evidence of focal opacification, pleural effusion or pneumothorax.  The cardiomediastinal silhouette is borderline normal in size.  No  acute osseous abnormalities are seen.  IMPRESSION: Mild vascular congestion noted; lungs remain grossly clear.  Original Report Authenticated By: Tonia Ghent, M.D.   Medications: Scheduled Meds:   . sodium chloride   Intravenous Once  . amitriptyline  25 mg Oral QHS  . aspirin  324 mg Oral Once  . aspirin EC  325 mg Oral Daily  . atenolol  50 mg Oral Daily  . atorvastatin  10 mg Oral q1800  . diltiazem  240 mg Oral Daily  . docusate sodium  100 mg Oral BID  . enoxaparin  40 mg Subcutaneous Q24H  . hydrocortisone  20 mg Oral Daily  . levothyroxine  75 mcg Oral QAC breakfast  .  morphine injection  2 mg Intravenous Once  . ondansetron  4 mg Intravenous Once  . pioglitazone  45 mg Oral Daily  . senna  1 tablet Oral BID  . sodium chloride  3 mL Intravenous Q12H  . Somatropin  5 mg Injection QPM  . DISCONTD: hydrocortisone  10-20 mg Oral Daily   Continuous Infusions:  PRN Meds:.sodium chloride, acetaminophen, acetaminophen, morphine injection, nitroGLYCERIN, ondansetron (ZOFRAN) IV, ondansetron, polyethylene glycol, sodium chloride  Assessment/Plan: Patient Active Hospital Problem List: Chest pain (02/08/2012)   Intermittent and relieved by sublingual nitro.   Cardiac enzymes negative. 2D echo within normal limits.  Cardiology consult called for possible stress test.   Hypertension () Not well controlled.   Diabetes mellitus ()  CBG (last 3)   Basename 02/08/12 1637 02/08/12 1151 02/08/12 0733  GLUCAP 92 88 96   Continue with SSI  Renal insufficiency (02/08/2012)   Probably pre renal, on iv fluids.  Will repeat labs in am. If creatinine doesn't improve will evaluate for obstructive causes.   Hypothyroidism:   -obtain TSH Continue with synthroid.   LOS: 0 days   Nazim Kadlec 02/08/2012, 10:45 AM

## 2012-02-08 NOTE — Progress Notes (Signed)
Pt c/o 6/10 chest pain mid chest radiating to back. EKG shows no significant changes. Pt refusing nitroglycerin at this time. MD notified. Pt administered 1 mg morphine IV with relief. Will continue to monitor.  Efraim Kaufmann

## 2012-02-08 NOTE — ED Notes (Addendum)
C/o CP/squeezing, onset 2315. Was a 10/10, now a 5/10. Also reports sob and nausea.

## 2012-02-08 NOTE — H&P (Signed)
PCP:  OWENS,REBECCA, FNP, FNP  Confirmed with pt. This is NP for Dr. Jillyn Hidden  Prior cardiologist is Dr. Elsie Lincoln, but they state he's retired and they don't have other cardiologist Dr. Sharl Ma, endocrinology Dr. Hart Rochester, vascular surgery   Chief Complaint:  Chest pain  HPI: 75yoF with h/o PVD s/p left CEA 09/2008 and recent AAA  repair/aortic endarterectomy 12/2011, CVA x2, HTN/DM2, Sheehan's  syndrome 1977 with adrenal insufficiency, hypothyroidism vs ?  hashimoto's thyroiditis now presents with chest pain.   Pt had last cath 08/2008 by Dr. Elsie Lincoln for left CEA pre-op  reasons, which showed 30% ostial D1 and normal coronaries  otherwise, EF 55-60%, normal right heart pressures.   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 She  states last week while laying in bed she had chest pressure  sensation radiating to back that lasted for an hour and resolved  on it own, she did not seek care for it. Then again tonight  around 1115p she developed substernal chest pressure sensation  radiating to back, then later diffusely across chest with  associated dizziness and nausea, but no vomiting, SOB,  clamminess, diaphoresis, or LOC, or other radiation.   In the ED initial vitals stable. Labs showed renal 32/1.29  otherwise normal chem, normal LFT's. Negative Trop POC,  stable/normal CBC. CXR with mild vascular congestion otherwise  clear. ECG not ischemic. Pt given 324 ASA, morphine, SL NTG x2,  and zofran. Her BP dropped to 62/40 p57 with SL NTG's for which  she was given IVF's and improved, most recently up to 130's.   ROS as above otherwise she has been stable recovering from her  AAA surgery. She denies any SOB, cough, or pleurisy. No GI  symptoms, no fevers, chills, sweats, and ROS o/w negative.    Past Medical History  Diagnosis Date  . Hypertension   . Hypothyroidism   . Stroke     Ischemic x 2       last one 2007  . Diabetes mellitus     type 11  . Endocrine  disturbance     gland has not  worked until 1970.Marland Kitchen..dx in 1977  . Blood transfusion     back in 1973  . GERD (gastroesophageal reflux disease)     on meds to help....meds do well   . Sheehan syndrome 1977  . AAA (abdominal aortic aneurysm)     S/p aortic repair and endarterectomy 12/2011   . Carotid stenosis, left     S/p left CEA 09/2008  . PVD (peripheral vascular disease)     Past Surgical History  Procedure Date  . Spine surgery     Lumbar Diskectomy  . Joint replacement May 2009    Right knee  . Joint replacement April 2008    Right Hip prothesis  . Carotid endarterectomy Dec. 2009  . Cardiac catheterization     1980  &  2009  . Appendectomy     1960  . Cesarean section   . Back surgery     1990's  . Abdominal hysterectomy 1973    1973  . Fracture surgery     left ankle   . Abdominal aortic aneurysm repair 12/09/2011    Procedure: ANEURYSM ABDOMINAL AORTIC REPAIR;  Surgeon: Pryor Ochoa, MD;  Location: [facility] OR;  Service: Vascular;  Laterality: N/A;  resection and grafting abdominal aortic aneurysm; Insertion Bilateral External Iliac Graft; Reexploration Proximal Anastemosis; Aortic Endarterectomy; Reimplantation Inferior Mesenteric Artery; Intraaortic Graft  Medications:  HOME MEDS: Reconciled with pt  Prior to Admission medications   Medication Sig Start Date End Date Taking? Authorizing Provider  alendronate (FOSAMAX) 70 MG tablet Take 70 mg by mouth every 7 (seven) days. Take with a full glass of water on an empty stomach.   Yes Historical Provider, MD  amitriptyline (ELAVIL) 25 MG tablet Take 25 mg by mouth at bedtime.   Yes Historical Provider, MD  aspirin 81 MG tablet Take 160 mg by mouth daily.   Yes Historical Provider, MD  atenolol (TENORMIN) 50 MG tablet Take 50 mg by mouth daily.   Yes Historical Provider, MD  cholecalciferol (VITAMIN D) 1000 UNITS tablet Take 3,000 Units by mouth daily.   Yes Historical Provider, MD  clotrimazole-betamethasone  (LOTRISONE) cream Apply topically 2 (two) times daily.   Yes Historical Provider, MD  Cyanocobalamin 1000 MCG/15ML LIQD Take by mouth.   Yes Historical Provider, MD  diltiazem (CARDIZEM CD) 240 MG 24 hr capsule Take 240 mg by mouth daily.   Yes Historical Provider, MD  esomeprazole (NEXIUM) 40 MG capsule Take 40 mg by mouth daily before breakfast.   Yes Historical Provider, MD  glucose monitoring kit (FREESTYLE) monitoring kit 1 each by Does not apply route as needed.   Yes Historical Provider, MD  hydrocortisone (CORTEF) 20 MG tablet Take 10-20 mg by mouth daily. 20 mg in the morning and 10 mg in the evening   Yes Historical Provider, MD  levothyroxine (SYNTHROID, LEVOTHROID) 75 MCG tablet Take 75 mcg by mouth daily.   Yes Historical Provider, MD  pioglitazone (ACTOS) 45 MG tablet Take 45 mg by mouth daily.   Yes Historical Provider, MD  potassium chloride SA (K-DUR,KLOR-CON) 20 MEQ tablet Take 1 tablet (20 mEq total) by mouth 2 (two) times daily. 12/16/11 12/15/12 Yes Regina J Roczniak, PA  rosuvastatin (CRESTOR) 5 MG tablet Take 5 mg by mouth daily.   Yes Historical Provider, MD  Somatropin (HUMATROPE) 5 MG SOLR Inject 5 mg as directed every evening. Inject 30 units every evening 12/10/11  Yes Historical Provider, MD  traMADol (ULTRAM) 50 MG tablet Take 50 mg by mouth every 6 (six) hours as needed.   Yes Historical Provider, MD  triamterene-hydrochlorothiazide (MAXZIDE-25) 37.5-25 MG per tablet Take 1 tablet by mouth daily.   Yes Historical Provider, MD    Allergies:  Allergies  Allergen Reactions  . Codeine Nausea Only  . Dilaudid (Hydromorphone Hcl) Nausea Only  . Lincomycin Hcl Other (See Comments)    Big bumps bilateral legs  . Macrodantin Hives  . Zolpidem Tartrate Other (See Comments)    Stroke  . Azithromycin Rash  . Penicillins Rash    Social History:   reports that she quit smoking about 48 years ago. Her smoking use included Cigarettes. She quit after 4 years of use. She has  never used smokeless tobacco. She reports that she does not drink alcohol or use illicit drugs. She lives at home and has a husband.   Family History: Family History  Problem Relation Age of Onset  . Heart disease Mother   . Peripheral vascular disease Mother   . Alcohol abuse Father   . Heart disease Sister   . Heart disease Brother     Physical Exam: Filed Vitals:   02/08/12 0315 02/08/12 0351 02/08/12 0351 02/08/12 0415  BP: 131/45 153/60 156/61 162/64  Pulse: 67  79 72  Temp:    97.9 F (36.6 C)  TempSrc:  Resp: 15  16 20   Height:    5\' 4"  (1.626 m)  Weight:    68.7 kg (151 lb 7.3 oz)  SpO2: 100%  98% 95%   Blood pressure 162/64, pulse 72, temperature 97.9 F (36.6 C), temperature source Oral, resp. rate 20, height 5\' 4"  (1.626 m), weight 68.7 kg (151 lb 7.3 oz), SpO2 95.00%.  Gen: Elderly but still quite robust appearing, overall moderately  healthy appearing F in no distress, pleasant, good historian,  stable appearing, doesn't appear uncomfortable HEENT: Pupils round, reactive, equal, EOMI, sclera clear and  normal. Mouth moist and normal appearing Lungs: CTAB no w/c/r, good air movement, equal respirations,  breathing comfortably no increased WOB or accessory muscles Heart: regular, not tachycardic with a crescendo murmur at the  BUSB's that decreases at the apex, no increased pain with  inspirations, no heaves Abd: Prominent midline abdominal scar from top to bottom, that  appears to be healing very well, no significant tenderness to  palpation, soft not rigid or peritoneal Extrem: Warm, perfusing normal, normal bulk and tone, radials  palpable pretty easily, no BLE edema noted, normal exam Neuro: Alert, attentive, conversant, CN 2-12 intact, no slurring  or aphasia, face symmetric, moves extremiteis on her own, able to  sit up in bed on her own, grossly nonfocal    Labs & Imaging Results for orders placed during the hospital encounter of 02/08/12 (from  the past 48 hour(s))  COMPREHENSIVE METABOLIC PANEL     Status: Abnormal   Collection Time   02/08/12 12:46 AM      Component Value Range Comment   Sodium 137  135 - 145 (mEq/L)    Potassium 3.8  3.5 - 5.1 (mEq/L)    Chloride 100  96 - 112 (mEq/L)    CO2 26  19 - 32 (mEq/L)    Glucose, Bld 123 (*) 70 - 99 (mg/dL)    BUN 32 (*) 6 - 23 (mg/dL)    Creatinine, Ser 1.61 (*) 0.50 - 1.10 (mg/dL)    Calcium 8.6  8.4 - 10.5 (mg/dL)    Total Protein 6.5  6.0 - 8.3 (g/dL)    Albumin 3.5  3.5 - 5.2 (g/dL)    AST 43 (*) 0 - 37 (U/L)    ALT 22  0 - 35 (U/L)    Alkaline Phosphatase 82  39 - 117 (U/L)    Total Bilirubin 0.3  0.3 - 1.2 (mg/dL)    GFR calc non Af Amer 40 (*) >90 (mL/min)    GFR calc Af Amer 46 (*) >90 (mL/min)   CBC     Status: Abnormal   Collection Time   02/08/12 12:46 AM      Component Value Range Comment   WBC 7.3  4.0 - 10.5 (K/uL)    RBC 3.66 (*) 3.87 - 5.11 (MIL/uL)    Hemoglobin 11.0 (*) 12.0 - 15.0 (g/dL)    HCT 09.6 (*) 04.5 - 46.0 (%)    MCV 93.4  78.0 - 100.0 (fL)    MCH 30.1  26.0 - 34.0 (pg)    MCHC 32.2  30.0 - 36.0 (g/dL)    RDW 40.9 (*) 81.1 - 15.5 (%)    Platelets 179  150 - 400 (K/uL)   DIFFERENTIAL     Status: Normal   Collection Time   02/08/12 12:46 AM      Component Value Range Comment   Neutrophils Relative 65  43 - 77 (%)    Neutro  Abs 4.7  1.7 - 7.7 (K/uL)    Lymphocytes Relative 23  12 - 46 (%)    Lymphs Abs 1.7  0.7 - 4.0 (K/uL)    Monocytes Relative 11  3 - 12 (%)    Monocytes Absolute 0.8  0.1 - 1.0 (K/uL)    Eosinophils Relative 1  0 - 5 (%)    Eosinophils Absolute 0.1  0.0 - 0.7 (K/uL)    Basophils Relative 0  0 - 1 (%)    Basophils Absolute 0.0  0.0 - 0.1 (K/uL)   POCT I-STAT TROPONIN I     Status: Normal   Collection Time   02/08/12  1:14 AM      Component Value Range Comment   Troponin i, poc 0.00  0.00 - 0.08 (ng/mL)    Comment 3            GLUCOSE, CAPILLARY     Status: Abnormal   Collection Time   02/08/12  4:19 AM      Component  Value Range Comment   Glucose-Capillary 117 (*) 70 - 99 (mg/dL)    Dg Chest Portable 1 View  02/08/2012  *RADIOLOGY REPORT*  Clinical Data: Chest pain.  PORTABLE CHEST - 1 VIEW  Comparison: Chest radiograph performed 12/10/2011  Findings: The lungs are well-aerated.  Mild vascular congestion is noted.  There is no evidence of focal opacification, pleural effusion or pneumothorax.  The cardiomediastinal silhouette is borderline normal in size.  No acute osseous abnormalities are seen.  IMPRESSION: Mild vascular congestion noted; lungs remain grossly clear.  Original Report Authenticated By: Tonia Ghent, M.D.     ECG: Her ECG is completely normal, even during active chest pain and unchanged from prior completely normal.   Impression Present on Admission:  .Chest pain .Sheehan syndrome .Hypertension .Diabetes mellitus .Renal insufficiency  75yoF with h/o PVD s/p left CEA 09/2008 and recent AAA  repair/aortic endarterectomy 12/2011, CVA x2, HTN/DM2, Sheehan's  syndrome 1977 with adrenal insufficiency, hypothyroidism vs ?  hashimoto's thyroiditis now presents with chest pain.   1. Chest pain: Has RF's of diffuse vascular disease, HTN, DM but  no cardiac history. No current objective evidence of ishcemia  with Trop negative x1 and very normal ECG despite 7/10 chest  pain.   DDx's include aortic dissection given radiation to back, but  bilateral BP's equal, no mediastinal widening, and no reported  recent HTN, BP's fairly decent at present, and pt doesn't  honestly look that ill to suggest such a diagnosis. Given recent  surgery, PE considered, but pt not tachy, hypoxic, having  pleurisy, and not even SOB, makes this much less likely. Has h/o  GERD on PPI, so this and and MSK cause also considered.   - Admit observation for rule out MI protocol. Increase to ASA  325, pain control, SL NTG and ECG prn pain.  - If workup negative and chest pain free, consider outpt  cardiology f/u for  stress vs cath - Continue home atenolol, diltiazem, statin  2. Renal insufficiency: Baseline 0.8-0.9 and currently 1.29. Will  gently hydrate and trend.  - Holding home HCTZ/triamterene for now   3. Sheehan syndrome: Continue home somatropin, hydrocortisone,  levothyroxine,   4. DM: Continue home actos  5. Continue home amitriptyline  Lovenox prophy  Telemetry, MC team 5 Full code, discussed  Other plans as per orders.   Kaymarie Wynn 02/08/2012, 4:40 AM

## 2012-02-08 NOTE — ED Notes (Signed)
CP 6/10, BP 164/61

## 2012-02-08 NOTE — ED Notes (Signed)
Pt c/o pain in chest since 2300 last night.  States she has had one episode of this last week, but it resolved on it's own.  Pain is now 4/10, was initially 8/10.  When pain started, pt was lying in bed but not asleep.  Pain was not associated with n/v, diaphoresis, SOB.

## 2012-02-08 NOTE — ED Notes (Signed)
Pt BP dropped to 62/40, fluids started by Sharyn Lull, RN.  Pt felt nauseous. Dr. Norlene Campbell notified and pt also placed to Trendelenburg.

## 2012-02-08 NOTE — ED Notes (Signed)
Dr. Kaylyn Layer at bedside for admission assessment

## 2012-02-08 NOTE — Evaluation (Signed)
Physical Therapy Evaluation Patient Details Name: Katie Pace MRN: [id number] DOB: 1936/09/17 Today's Date: 02/08/2012 Time: 4098-1191 PT Time Calculation (min): 21 min  PT Assessment / Plan / Recommendation Clinical Impression  Patient presents with chest pain and admitted for rule out MI. She presents with no new issues with mobilty - she is modified indep and at baseline    PT Assessment  Patent does not need any further PT services    Follow Up Recommendations  No PT follow up    Equipment Recommendations  None recommended by PT    Frequency   N/A   Precautions / Restrictions Precautions Precautions: Fall   Pertinent Vitals/Pain No reports of pain. VSS      Mobility  Bed Mobility Bed Mobility: Rolling Right;Right Sidelying to Sit;Sitting - Scoot to [place] of Bed;Sit to Supine Rolling Right: 7: Independent Right Sidelying to Sit: 7: Independent Sitting - Scoot to Edge of Bed: 7: Independent Sit to Supine: 7: Independent Transfers Transfers: Sit to Stand;Stand to Sit Sit to Stand: 7: Independent;From bed;From toilet;With upper extremity assist Stand to Sit: 7: Independent;With upper extremity assist;To toilet;To bed Ambulation/Gait Ambulation/Gait Assistance: 6: Modified independent (Device/Increase time) Ambulation Distance (Feet): 200 Feet Assistive device:  (ski poles) Ambulation/Gait Assistance Details: Patient also ambulated to and from bathroom without poles  - modified indep. She does have chronic deficits that limit her activity tolerance and speed.  Stairs: No (declines - feels at baseline)         Visit Information  Last PT Received On: 02/08/12    Subjective Data  Subjective: Patient reports issues with balance for many years PTA secondary to back and feet orthopedic/neurological issues. She states no recent falls Patient Stated Goal: Home   Prior Functioning  Home Living Lives With: Spouse Available Help at Discharge: Family;Available 24  hours/day Type of Home: House Home Access: Stairs to enter [employer] of Steps: 2 Entrance Stairs-Rails: None Home Layout: Two level;Able to live on main level with bedroom/bathroom Bathroom Shower/Tub: Tub/shower unit Bathroom Toilet: Handicapped height Home Adaptive Equipment: Shower chair with back;Wheelchair - manual;Grab bars around toilet (ski poles) Prior Function Level of Independence: Needs assistance Needs Assistance: Gait;Light Housekeeping;Meal Prep Meal Prep: Moderate Light Housekeeping: Maximal Gait Assistance: Limited distance and so needs spouse for taking "rolling chair" for patient to sit as able Vocation: Retired Musician: No difficulties    Cognition  Overall Cognitive Status: Appears within functional limits for tasks assessed/performed Arousal/Alertness: Awake/alert Orientation Level: Appears intact for tasks assessed Behavior During Session: [facility] for tasks performed    Extremity/Trunk Assessment Right Lower Extremity Assessment RLE ROM/Strength/Tone: Deficits RLE ROM/Strength/Tone Deficits: Limitations of foot/ankle secondary to pre-morbid defcits. External tibial rotation with flat arches RLE Sensation: Deficits RLE Sensation Deficits: Chronic numbenss/tingling of feet RLE Coordination: WFL - gross/fine motor Left Lower Extremity Assessment LLE ROM/Strength/Tone: Deficits LLE ROM/Strength/Tone Deficits: Limitations of foot/ankle secondary to pre-morbid defcits. External tibial rotation with flat arches LLE Sensation: Deficits LLE Sensation Deficits: Chronic numbenss/tingling of feet.  LLE Coordination: WFL - gross/fine motor   Balance Static Standing Balance Static Standing - Balance Support: No upper extremity supported Static Standing - Level of Assistance: 7: Independent Static Standing - Comment/# of Minutes: Stood for managing underwear post toileting without issues, and for donning/doffing extra gown.   End of  Session PT - End of Session Equipment Utilized During Treatment: Gait belt Activity Tolerance: Patient tolerated treatment well Patient left: in bed;with bed alarm set;with call bell/phone within reach Nurse Communication:  Mobility status   Edwyna Perfect, [place]  Pager [phone]  02/08/2012, 10:35 AM

## 2012-02-09 DIAGNOSIS — R079 Chest pain, unspecified: Secondary | ICD-10-CM

## 2012-02-09 DIAGNOSIS — E230 Hypopituitarism: Secondary | ICD-10-CM | POA: Diagnosis not present

## 2012-02-09 DIAGNOSIS — I10 Essential (primary) hypertension: Secondary | ICD-10-CM

## 2012-02-09 DIAGNOSIS — E119 Type 2 diabetes mellitus without complications: Secondary | ICD-10-CM | POA: Diagnosis not present

## 2012-02-09 LAB — CBC
HCT: 32.4 % — ABNORMAL LOW (ref 36.0–46.0)
Hemoglobin: 10.5 g/dL — ABNORMAL LOW (ref 12.0–15.0)
MCH: 29.7 pg (ref 26.0–34.0)
MCHC: 32.4 g/dL (ref 30.0–36.0)
MCV: 91.5 fL (ref 78.0–100.0)

## 2012-02-09 LAB — URINALYSIS, ROUTINE W REFLEX MICROSCOPIC
Glucose, UA: NEGATIVE mg/dL
Hgb urine dipstick: NEGATIVE
Specific Gravity, Urine: 1.015 (ref 1.005–1.030)

## 2012-02-09 LAB — URINE MICROSCOPIC-ADD ON

## 2012-02-09 LAB — GLUCOSE, CAPILLARY: Glucose-Capillary: 94 mg/dL (ref 70–99)

## 2012-02-09 LAB — BASIC METABOLIC PANEL
BUN: 20 mg/dL (ref 6–23)
CO2: 27 mEq/L (ref 19–32)
Calcium: 8.6 mg/dL (ref 8.4–10.5)
Creatinine, Ser: 0.98 mg/dL (ref 0.50–1.10)
GFR calc non Af Amer: 56 mL/min — ABNORMAL LOW (ref >90)
Glucose, Bld: 97 mg/dL (ref 70–99)

## 2012-02-09 LAB — HEMOGLOBIN A1C: Mean Plasma Glucose: 131 mg/dL — ABNORMAL HIGH (ref <117)

## 2012-02-09 MED ORDER — LISINOPRIL 10 MG PO TABS: 10.0000 mg | ORAL_TABLET | Freq: Every day | ORAL | Status: DC

## 2012-02-09 MED ORDER — ASPIRIN 325 MG PO TBEC: 325.0000 mg | DELAYED_RELEASE_TABLET | Freq: Every day | ORAL | Status: AC

## 2012-02-09 MED ORDER — POTASSIUM CHLORIDE CRYS ER 20 MEQ PO TBCR
40.0000 meq | EXTENDED_RELEASE_TABLET | Freq: Once | ORAL | Status: AC
  Administered 2012-02-09: 40 meq via ORAL
  Filled 2012-02-09: qty 2

## 2012-02-09 MED ORDER — LISINOPRIL 10 MG PO TABS
10.0000 mg | ORAL_TABLET | Freq: Every day | ORAL | Status: DC
  Administered 2012-02-09 – 2012-02-10 (×2): 10 mg via ORAL
  Filled 2012-02-09 (×2): qty 1

## 2012-02-09 MED ORDER — ACETAMINOPHEN 325 MG PO TABS: 650.0000 mg | ORAL_TABLET | Freq: Four times a day (QID) | ORAL | Status: DC | PRN

## 2012-02-09 MED ORDER — HYDRALAZINE HCL 20 MG/ML IJ SOLN
10.0000 mg | INTRAMUSCULAR | Status: DC | PRN
  Administered 2012-02-10: 10 mg via INTRAVENOUS
  Filled 2012-02-09: qty 1

## 2012-02-09 NOTE — Progress Notes (Signed)
Pt was being discharged when she got very sick and threw up. She felt better afterwards but still queezy. Dr. Brien Few notified and orders received for discharge cancelled by Dr. Brien Few. Pt placed back on monitor. Will cont to monitor

## 2012-02-09 NOTE — Discharge Summary (Addendum)
Physician Discharge Summary  Patient ID: Katie Pace MRN: [id number] DOB/AGE: 05-19-1936 76 y.o.  Admit date: 02/08/2012 Discharge date: 02/10/2012  Primary Care Physician:  Claude Manges, FNP, FNP   Discharge Diagnoses:    Patient Active Problem List  Diagnoses  . Abdominal aneurysm without mention of rupture  . AAA (abdominal aortic aneurysm)  . Sheehan syndrome  . Hypertension  . Chest pain  . Diabetes mellitus  . Renal insufficiency    Medication List  As of 02/10/2012 11:22 AM   STOP taking these medications         aspirin 81 MG tablet      potassium chloride SA 20 MEQ tablet      triamterene-hydrochlorothiazide 37.5-25 MG per tablet         TAKE these medications         acetaminophen 325 MG tablet   Commonly known as: TYLENOL   Take 2 tablets (650 mg total) by mouth every 6 (six) hours as needed for pain.      alendronate 70 MG tablet   Commonly known as: FOSAMAX   Take 70 mg by mouth every 7 (seven) days. Take with a full glass of water on an empty stomach.      amitriptyline 25 MG tablet   Commonly known as: ELAVIL   Take 25 mg by mouth at bedtime.      aspirin 325 MG EC tablet   Take 1 tablet (325 mg total) by mouth daily.      atenolol 50 MG tablet   Commonly known as: TENORMIN   Take 50 mg by mouth daily.      cholecalciferol 1000 UNITS tablet   Commonly known as: VITAMIN D   Take 3,000 Units by mouth daily.      clotrimazole-betamethasone cream   Commonly known as: LOTRISONE   Apply topically 2 (two) times daily.      Cyanocobalamin 1000 MCG/15ML Liqd   Take by mouth.      diltiazem 240 MG 24 hr capsule   Commonly known as: CARDIZEM CD   Take 240 mg by mouth daily.      esomeprazole 40 MG capsule   Commonly known as: NEXIUM   Take 40 mg by mouth daily before breakfast.      glucose monitoring kit monitoring kit   1 each by Does not apply route as needed.      HUMATROPE 5 MG Solr   Generic drug: Somatropin   Inject 0.3 mg into  the skin every evening.      hydrocortisone 20 MG tablet   Commonly known as: CORTEF   Take 10-20 mg by mouth daily. 20 mg in the morning and 10 mg in the evening      levothyroxine 75 MCG tablet   Commonly known as: SYNTHROID, LEVOTHROID   Take 75 mcg by mouth daily.      lisinopril 10 MG tablet   Commonly known as: PRINIVIL,ZESTRIL   Take 1 tablet (10 mg total) by mouth daily.      pioglitazone 45 MG tablet   Commonly known as: ACTOS   Take 45 mg by mouth daily.      rosuvastatin 5 MG tablet   Commonly known as: CRESTOR   Take 5 mg by mouth daily.      traMADol 50 MG tablet   Commonly known as: ULTRAM   Take 50 mg by mouth every 6 (six) hours as needed.  Disposition and Follow-up:  Follow up with primary MD and with Cardiologist.  Consults:  cardiology  Dr. Marca Ancona.  Significant Diagnostic Studies:  Dg Chest Portable 1 View  02/08/2012  *RADIOLOGY REPORT*  Clinical Data: Chest pain.  PORTABLE CHEST - 1 VIEW  Comparison: Chest radiograph performed 12/10/2011  Findings: The lungs are well-aerated.  Mild vascular congestion is noted.  There is no evidence of focal opacification, pleural effusion or pneumothorax.  The cardiomediastinal silhouette is borderline normal in size.  No acute osseous abnormalities are seen.  IMPRESSION: Mild vascular congestion noted; lungs remain grossly clear.  Original Report Authenticated By: Tonia Ghent, M.D.    Brief H and P: For complete details, refer to admission H and P. However, in brief, this is a 76 year old female, with h/o PVD s/p left CEA 09/2008, AAA repair/aortic endarterectomy 12/2011, CVA x2, HTN/DM2, Sheehan's  syndrome 1977 with adrenal insufficiency, and hypothyroidism presenting with recurrent chest pain. She is status post cardiac cath 08/2008 by Dr. Elsie Lincoln for left CEA pre-op reasons, which showed 30% ostial D1 and normal coronaries otherwise, EF 55-60%, normal right heart pressures. She was admitted for  further evaluation, investigation and management.  Physical Exam: General:   Alert, communicative, fully oriented, talking in complete sentences, not short of breath at rest.  HEENT:  Mild clinical pallor, no jaundice, no conjunctival injection or discharge. Hydration status is fair. NECK:  Supple, JVP not seen, no carotid bruits, no palpable lymphadenopathy, no palpable goiter. CHEST:  Clinically clear to auscultation, no wheezes, no crackles. HEART:  Sounds 1 and 2 heard, normal, regular, no murmurs. ABDOMEN:  Moderately obese, soft, non-tender, no palpable organomegaly, no palpable masses, normal bowel sounds. GENITALIA:  Not examined. LOWER EXTREMITIES:  No pitting edema, palpable peripheral pulses. MUSCULOSKELETAL SYSTEM:  Generalized osteoarthritic changes, otherwise, normal. CENTRAL NERVOUS SYSTEM:  No focal neurologic deficit on gross examination.   Hospital Course:  Principal Problem:  *Chest pain: Patient presented as described above, and was placed on rule out MI protocol, monitored telemetrically, and placed on ASA 325. EKG showed no acute ischemic changes, cardiac enzymes remained un-elevated, CXR showed no active disease. Cardiology consult was provided by Dr Shirlee Latch. Review of patient's stress myoview  Of 11/2011, was reported as low risk. As patient remained asymptomatic, she was cleared by cardiology team for discharge, with outpatient follow up. Active Problems:  1. Sheehan syndrome: Patient has adrenal insufficiency, based on this. She was continued on steroid and thyroxine replacement therapy in pre-admission doses, as well as Somatropin.  2. Hypertension: Continued home atenolol, diltiazem. As Diuretics have been discontinued (See below), Lisinopril has been added.  3. Diabetes Mellitus: This was well controlled on diet, SSI and oral hypoglycemics. HBA1C was 6.2.  4. Renal insufficiency: Patient had a creatinine of 1.29 and BUN of 32, against a baseline creatinine of 0.8-0.9.  She was managed with iv fluids, and diuretics have been discontinued.  Comment: Patient complained of generalized aches and pains on 02/09/12 AM, without localizing features of infection, temp in the range 98.4-99.7 and normal wcc. Suspect a viral syndrome. Recommend Tylenol and oral fluids. Patient to call PMD, in symptoms worsen. Otherwise stable for discharge.  Time spent on Discharge: 35 mins.  Signed: Avni Traore,CHRISTOPHER 02/10/2012, 11:22 AM

## 2012-02-09 NOTE — Progress Notes (Signed)
Patient ID: Katie Pace, female   DOB: Feb 13, 1936, 76 y.o.   MRN: [id number]    SUBJECTIVE: No further chest pain.  She is achy all over and has had some back pain from lying in bed.  Cardiac enzymes all negative.      Marland Kitchen amitriptyline  25 mg Oral QHS  . aspirin EC  325 mg Oral Daily  . atenolol  50 mg Oral Daily  . atorvastatin  10 mg Oral q1800  . diltiazem  240 mg Oral Daily  . docusate sodium  100 mg Oral BID  . enoxaparin  40 mg Subcutaneous Q24H  . hydrocortisone  20 mg Oral Daily  . insulin aspart  0-5 Units Subcutaneous QHS  . insulin aspart  0-9 Units Subcutaneous TID WC  . levothyroxine  75 mcg Oral QAC breakfast  . pioglitazone  45 mg Oral Daily  . senna  1 tablet Oral BID  . sodium chloride  3 mL Intravenous Q12H  . Somatropin  0.3 mg Injection QPM      Filed Vitals:   02/08/12 1400 02/08/12 2130 02/09/12 0056 02/09/12 0500  BP: 181/70 200/80 178/62 169/64  Pulse: 65 63 68 68  Temp: 98.4 F (36.9 C) 98.9 F (37.2 C)  99.7 F (37.6 C)  TempSrc: Oral Oral  Oral  Resp: 17 16  18   Height:      Weight:      SpO2: 96% 96%  95%    Intake/Output Summary (Last 24 hours) at 02/09/12 0734 Last data filed at 02/08/12 1200  Gross per 24 hour  Intake    480 ml  Output      0 ml  Net    480 ml    LABS: Basic Metabolic Panel:  Basename 02/08/12 0046  NA 137  K 3.8  CL 100  CO2 26  GLUCOSE 123*  BUN 32*  CREATININE 1.29*  CALCIUM 8.6  MG --  PHOS --   Liver Function Tests:  [facility] 02/08/12 0046  AST 43*  ALT 22  ALKPHOS 82  BILITOT 0.3  PROT 6.5  ALBUMIN 3.5   No results found for this basename: LIPASE:2,AMYLASE:2 in the last 72 hours CBC:  Basename 02/08/12 0046  WBC 7.3  NEUTROABS 4.7  HGB 11.0*  HCT 34.2*  MCV 93.4  PLT 179   Cardiac Enzymes:  Basename 02/08/12 2117 02/08/12 1243 02/08/12 0700  CKTOTAL 35 32 26  CKMB 2.4 2.0 1.4  CKMBINDEX -- -- --  TROPONINI <0.30 <0.30 <0.30   BNP: No components found with this  basename: POCBNP:3 D-Dimer: No results found for this basename: DDIMER:2 in the last 72 hours Hemoglobin A1C:  Basename 02/08/12 2117  HGBA1C 6.2*   Fasting Lipid Panel: No results found for this basename: CHOL,HDL,LDLCALC,TRIG,CHOLHDL,LDLDIRECT in the last 72 hours Thyroid Function Tests: No results found for this basename: TSH,T4TOTAL,FREET3,T3FREE,THYROIDAB in the last 72 hours Anemia Panel: No results found for this basename: VITAMINB12,FOLATE,FERRITIN,TIBC,IRON,RETICCTPCT in the last 72 hours  RADIOLOGY: Dg Chest Portable 1 View  02/08/2012  *RADIOLOGY REPORT*  Clinical Data: Chest pain.  PORTABLE CHEST - 1 VIEW  Comparison: Chest radiograph performed 12/10/2011  Findings: The lungs are well-aerated.  Mild vascular congestion is noted.  There is no evidence of focal opacification, pleural effusion or pneumothorax.  The cardiomediastinal silhouette is borderline normal in size.  No acute osseous abnormalities are seen.  IMPRESSION: Mild vascular congestion noted; lungs remain grossly clear.  Original Report Authenticated By: Tonia Ghent, M.D.  PHYSICAL EXAM General: NAD Neck: No JVD, no thyromegaly or thyroid nodule.  Lungs: Clear to auscultation bilaterally with normal respiratory effort. CV: Nondisplaced PMI.  Heart regular S1/S2, no S3/S4, no murmur.  No peripheral edema.  No carotid bruit.  Normal pedal pulses.  Abdomen: Soft, nontender, no hepatosplenomegaly, no distention.  Neurologic: Alert and oriented x 3.  Psych: Normal affect. Extremities: No clubbing or cyanosis.   TELEMETRY: Reviewed telemetry pt in NSR  ASSESSMENT AND PLAN:  76 y.o. female w/ PMHx significant for AAA s/p repair 12/2011, Carotid dz s/p L CEA '09, HTN, DM, GERD, CVA, and hypothyroidism who presented to [facility] on 02/08/2012 with complaints of chest pain.  She has ruled out for MI, ECG without acute changes.  Pain was atypical.  She had a myoview in 2/13 showing small inferolateral fixed  defect with normal wall motion.  There was no ischemia.  It was a low risk study.  At this point, no further inpatient testing.  I would continue her on ASA 81 mg daily.  Given known vascular disease, she would like benefit from a statin.  Followup with cardiology as an outpatient.   Marca Ancona 02/09/2012 7:37 AM

## 2012-02-10 DIAGNOSIS — I10 Essential (primary) hypertension: Secondary | ICD-10-CM | POA: Diagnosis not present

## 2012-02-10 DIAGNOSIS — E119 Type 2 diabetes mellitus without complications: Secondary | ICD-10-CM

## 2012-02-10 DIAGNOSIS — E230 Hypopituitarism: Secondary | ICD-10-CM

## 2012-02-10 DIAGNOSIS — R079 Chest pain, unspecified: Secondary | ICD-10-CM | POA: Diagnosis not present

## 2012-02-10 LAB — CBC
Hemoglobin: 11.4 g/dL — ABNORMAL LOW (ref 12.0–15.0)
MCH: 30.2 pg (ref 26.0–34.0)
MCHC: 32.6 g/dL (ref 30.0–36.0)
Platelets: 176 10*3/uL (ref 150–400)
RBC: 3.77 MIL/uL — ABNORMAL LOW (ref 3.87–5.11)

## 2012-02-10 LAB — BASIC METABOLIC PANEL
CO2: 28 mEq/L (ref 19–32)
Calcium: 8.7 mg/dL (ref 8.4–10.5)
GFR calc non Af Amer: 49 mL/min — ABNORMAL LOW (ref >90)
Glucose, Bld: 67 mg/dL — ABNORMAL LOW (ref 70–99)
Potassium: 3.4 mEq/L — ABNORMAL LOW (ref 3.5–5.1)
Sodium: 137 mEq/L (ref 135–145)

## 2012-02-10 LAB — GLUCOSE, CAPILLARY
Glucose-Capillary: 69 mg/dL — ABNORMAL LOW (ref 70–99)
Glucose-Capillary: 111 mg/dL — ABNORMAL HIGH (ref 70–99)

## 2012-02-10 MED ORDER — POTASSIUM CHLORIDE CRYS ER 20 MEQ PO TBCR
40.0000 meq | EXTENDED_RELEASE_TABLET | Freq: Once | ORAL | Status: AC
  Administered 2012-02-10: 40 meq via ORAL
  Filled 2012-02-10: qty 2

## 2012-02-10 NOTE — Progress Notes (Signed)
UR Completed Evalina Tabak Graves-Bigelow, RN,BSN [phone]  

## 2012-02-10 NOTE — Progress Notes (Signed)
Pt's cbg=69 this am, pt was offered juice and is about to eat her breakfast. Tech  will recheck cbg again.---Patricio Popwell, D. rn

## 2012-02-10 NOTE — Progress Notes (Signed)
   CARE MANAGEMENT NOTE 02/10/2012  Patient:  Katie Pace, Katie Pace   Account Number:  [id number]  Date Initiated:  02/09/2012  Documentation initiated by:  [facility]  Subjective/Objective Assessment:   Admitted with chest pain. Lives with spouse.     Action/Plan:   Anticipated DC Date:  02/10/2012   Anticipated DC Plan:  HOME/SELF CARE      DC Planning Services  CM consult      Choice offered to / List presented to:             Status of service:  In process, will continue to follow Medicare Important Message given?   (If response is "NO", the following Medicare IM given date fields will be blank) Date Medicare IM given:   Date Additional Medicare IM given:    Discharge Disposition:    Per UR Regulation:    If discussed at Long Length of Stay Meetings, dates discussed:    Comments:  PCP Dr. Claude Manges  02-10-12 [address], [place] [phone] CM will continue to monitor for disposition needs.

## 2012-02-10 NOTE — Progress Notes (Signed)
  Comment: Discharge was deferred on 02/09/12, because although patient had no further chest pain, she complained of feeling unwell, had generalized aches, and a low grade temperature. She subsequently vomited once.  Overnight, there has been no temperature spike and no escalation of symptoms. Wcc has remained normal, U/A is negative and patient has no respiratory tract symptoms.  Physical Exam:  General: Alert, communicative, fully oriented, talking in complete sentences, not short of breath at rest.  HEENT: Mild clinical pallor, no jaundice, no conjunctival injection or discharge. Hydration status is fair.  NECK: Supple, JVP not seen, no carotid bruits, no palpable lymphadenopathy, no palpable goiter.  CHEST: Clinically clear to auscultation, no wheezes, no crackles.  HEART: Sounds 1 and 2 heard, normal, regular, no murmurs.  ABDOMEN: Moderately obese, soft, non-tender, no palpable organomegaly, no palpable masses, normal bowel sounds.  GENITALIA: Not examined.  LOWER EXTREMITIES: No pitting edema, palpable peripheral pulses.  MUSCULOSKELETAL SYSTEM: Generalized osteoarthritic changes, otherwise, normal.  CENTRAL NERVOUS SYSTEM: No focal neurologic deficit on gross examination.    Comment: Patient likely has a viral syndrome. Otherwise, clinically stable.  Plan: We shall discahrge as planned, today.  C. Alonnie Bieker. MD.

## 2012-02-10 NOTE — Progress Notes (Signed)
Pt's sbp was in the 180's, bp med given iv. Pt's bp is now 115/54.---Izora Benn, D, rn

## 2012-02-11 DIAGNOSIS — E559 Vitamin D deficiency, unspecified: Secondary | ICD-10-CM | POA: Diagnosis not present

## 2012-02-11 DIAGNOSIS — M8440XA Pathological fracture, unspecified site, initial encounter for fracture: Secondary | ICD-10-CM | POA: Diagnosis not present

## 2012-02-11 DIAGNOSIS — E119 Type 2 diabetes mellitus without complications: Secondary | ICD-10-CM | POA: Diagnosis not present

## 2012-02-11 DIAGNOSIS — E230 Hypopituitarism: Secondary | ICD-10-CM | POA: Diagnosis not present

## 2012-02-12 DIAGNOSIS — L820 Inflamed seborrheic keratosis: Secondary | ICD-10-CM | POA: Diagnosis not present

## 2012-02-12 DIAGNOSIS — D485 Neoplasm of uncertain behavior of skin: Secondary | ICD-10-CM | POA: Diagnosis not present

## 2012-02-12 DIAGNOSIS — L821 Other seborrheic keratosis: Secondary | ICD-10-CM | POA: Diagnosis not present

## 2012-02-12 DIAGNOSIS — D0439 Carcinoma in situ of skin of other parts of face: Secondary | ICD-10-CM | POA: Diagnosis not present

## 2012-02-12 DIAGNOSIS — Z85828 Personal history of other malignant neoplasm of skin: Secondary | ICD-10-CM | POA: Diagnosis not present

## 2012-02-15 DIAGNOSIS — E119 Type 2 diabetes mellitus without complications: Secondary | ICD-10-CM | POA: Diagnosis not present

## 2012-02-15 DIAGNOSIS — E559 Vitamin D deficiency, unspecified: Secondary | ICD-10-CM | POA: Diagnosis not present

## 2012-02-15 DIAGNOSIS — G609 Hereditary and idiopathic neuropathy, unspecified: Secondary | ICD-10-CM | POA: Diagnosis not present

## 2012-02-15 DIAGNOSIS — M8440XA Pathological fracture, unspecified site, initial encounter for fracture: Secondary | ICD-10-CM | POA: Diagnosis not present

## 2012-02-15 DIAGNOSIS — E230 Hypopituitarism: Secondary | ICD-10-CM | POA: Diagnosis not present

## 2012-02-15 DIAGNOSIS — E538 Deficiency of other specified B group vitamins: Secondary | ICD-10-CM | POA: Diagnosis not present

## 2012-02-16 DIAGNOSIS — L97409 Non-pressure chronic ulcer of unspecified heel and midfoot with unspecified severity: Secondary | ICD-10-CM | POA: Diagnosis not present

## 2012-02-16 DIAGNOSIS — M19079 Primary osteoarthritis, unspecified ankle and foot: Secondary | ICD-10-CM | POA: Diagnosis not present

## 2012-02-23 ENCOUNTER — Encounter: Payer: Medicare Other | Admitting: Adult Health

## 2012-02-23 DIAGNOSIS — E119 Type 2 diabetes mellitus without complications: Secondary | ICD-10-CM | POA: Diagnosis not present

## 2012-02-26 ENCOUNTER — Encounter: Payer: Self-pay | Admitting: Vascular Surgery

## 2012-03-01 ENCOUNTER — Encounter: Payer: Self-pay | Admitting: Vascular Surgery

## 2012-03-01 ENCOUNTER — Ambulatory Visit (INDEPENDENT_AMBULATORY_CARE_PROVIDER_SITE_OTHER): Payer: Medicare Other | Admitting: Vascular Surgery

## 2012-03-01 VITALS — BP 135/53 | HR 64 | Temp 97.6°F | Ht 63.0 in | Wt 150.0 lb

## 2012-03-01 DIAGNOSIS — Z48812 Encounter for surgical aftercare following surgery on the circulatory system: Secondary | ICD-10-CM

## 2012-03-01 DIAGNOSIS — I714 Abdominal aortic aneurysm, without rupture: Secondary | ICD-10-CM

## 2012-03-01 DIAGNOSIS — I6529 Occlusion and stenosis of unspecified carotid artery: Secondary | ICD-10-CM

## 2012-03-01 NOTE — Progress Notes (Signed)
Subjective:     Patient ID: Katie Pace, female   DOB: June 07, 1936, 76 y.o.   MRN: [id number]  HPI this 76 year old female returns 3 months post resection and grafting abdominal aortic aneurysm. Her appetite is improving and her ambulation is improving. She continues to have numbness in both lower extremities from peripheral neuropathy.  Past Medical History  Diagnosis Date  . Hypertension   . Hypothyroidism   . Stroke     Ischemic x 2       last one 2007  . Diabetes mellitus     type 11  . Endocrine disturbance     gland has not  worked until 1970.Marland Kitchen..dx in 1977  . Blood transfusion     back in 1973  . GERD (gastroesophageal reflux disease)     on meds to help....meds do well   . Sheehan syndrome 1977  . AAA (abdominal aortic aneurysm)     S/p aortic repair and endarterectomy 12/2011   . Carotid stenosis, left     S/p left CEA 09/2008  . PVD (peripheral vascular disease)     History  Substance Use Topics  . Smoking status: Former Smoker -- 4 years    Types: Cigarettes    Quit date: 10/06/1963  . Smokeless tobacco: Never Used  . Alcohol Use: No    Family History  Problem Relation Age of Onset  . Heart disease Mother   . Peripheral vascular disease Mother   . Alcohol abuse Father   . Heart disease Sister   . Heart disease Brother     Allergies  Allergen Reactions  . Codeine Nausea Only  . Dilaudid (Hydromorphone Hcl) Nausea Only  . Lincomycin Hcl Other (See Comments)    Big bumps bilateral legs  . Macrodantin Hives  . Zolpidem Tartrate Other (See Comments)    Stroke  . Azithromycin Rash  . Penicillins Rash    Current outpatient prescriptions:acetaminophen (TYLENOL) 325 MG tablet, Take 2 tablets (650 mg total) by mouth every 6 (six) hours as needed for pain., Disp: 40 tablet, Rfl: 0;  alendronate (FOSAMAX) 70 MG tablet, Take 70 mg by mouth every 7 (seven) days. Take with a full glass of water on an empty stomach., Disp: , Rfl: ;  amitriptyline (ELAVIL) 25 MG  tablet, Take 25 mg by mouth at bedtime., Disp: , Rfl:  aspirin 81 MG tablet, Take 81 mg by mouth daily., Disp: , Rfl: ;  atenolol (TENORMIN) 50 MG tablet, Take 50 mg by mouth daily., Disp: , Rfl: ;  cholecalciferol (VITAMIN D) 1000 UNITS tablet, Take 3,000 Units by mouth daily., Disp: , Rfl: ;  clotrimazole-betamethasone (LOTRISONE) cream, Apply topically 2 (two) times daily., Disp: , Rfl: ;  Cyanocobalamin 1000 MCG/15ML LIQD, Take by mouth., Disp: , Rfl:  diltiazem (CARDIZEM CD) 240 MG 24 hr capsule, Take 240 mg by mouth daily., Disp: , Rfl: ;  diltiazem (TIAZAC) 240 MG 24 hr capsule, Take 240 mg by mouth daily., Disp: , Rfl: ;  dipyridamole-aspirin (AGGRENOX) 25-200 MG per 12 hr capsule, Take 1 capsule by mouth 2 (two) times daily., Disp: , Rfl: ;  esomeprazole (NEXIUM) 40 MG capsule, Take 40 mg by mouth daily before breakfast., Disp: , Rfl:  glucose monitoring kit (FREESTYLE) monitoring kit, 1 each by Does not apply route as needed., Disp: , Rfl: ;  hydrocortisone (CORTEF) 20 MG tablet, Take 10-20 mg by mouth daily. 20 mg in the morning and 10 mg in the evening, Disp: , Rfl: ;  levothyroxine (SYNTHROID, LEVOTHROID) 75 MCG tablet, Take 75 mcg by mouth daily., Disp: , Rfl: ;  lisinopril (PRINIVIL,ZESTRIL) 10 MG tablet, Take 1 tablet (10 mg total) by mouth daily., Disp: 30 tablet, Rfl: 0 pioglitazone (ACTOS) 45 MG tablet, Take 45 mg by mouth daily., Disp: , Rfl: ;  potassium citrate (UROCIT-K) 10 MEQ (1080 MG) SR tablet, Take 10 mEq by mouth 2 (two) times daily., Disp: , Rfl: ;  rosuvastatin (CRESTOR) 5 MG tablet, Take 5 mg by mouth daily., Disp: , Rfl: ;  Somatropin (HUMATROPE) 5 MG SOLR, Inject 0.3 mg into the skin every evening., Disp: , Rfl:  traMADol (ULTRAM) 50 MG tablet, Take 50 mg by mouth every 6 (six) hours as needed., Disp: , Rfl:   BP 135/53  Pulse 64  Temp(Src) 97.6 F (36.4 C) (Oral)  Ht 5\' 3"  (1.6 m)  Wt 150 lb (68.04 kg)  BMI 26.57 kg/m2  SpO2 100%  Body mass index is 26.57  kg/(m^2).         Review of Systems     Objective:   Physical Exam blood pressure 135/53 heart rate 64 respirations 14 General well-developed well-nourished female in no apparent stress alert and oriented x3 Abdomen soft nontender. Midline incision well-healed no evidence of ventral hernia Extremities 3+ femoral popliteal pulses palpable bilaterally with well perfused lower extremities.    Assessment:     Doing well 3 months post open resection and grafting of abdominal aortic aneurysm 4 large aneurysm and proximal aortic endarterectomy necessary    Plan:     Return in 12 months with #1 carotid duplex exam #2 ABIs #3 duplex scan aortoiliac system and #4 see nurse practitioner

## 2012-03-01 NOTE — Progress Notes (Deleted)
Subjective:     Patient ID: Katie Pace, female   DOB: Oct 07, 1935, 76 y.o.   MRN: [id number]  HPI this 76 year old female returns almost 3 months post resection and grafting of abdominal aortic aneurysm. She has had an improved appetite and is ambulating better. She is not back to full-strength at this time. She continues to have numbness in both legs from knees to the feet due to peripheral neuropathy. She also has some problems with her feet with neurotrophic ulcers followed by Dr. Berna Spare .  Physician Discharge Summary  Patient ID: SONNI BARSE MRN: [id number] DOB/AGE: Aug 28, 1936 76 y.o.  Admit date: (Not on file) Discharge date: 03/01/2012  Admission Diagnosis: @ADMDX @  Discharge Diagnoses:  @ADMDX @  Secondary Diagnoses: Active Problems:  * No active hospital problems. *    Procedures: *** HERNIA:  Katie Pace is an 76 y.o. female who was referred for evaluation of a  {Symptoms; hernia:13574}. Symptoms were first noted {numbers; 0-10:33138} {Time; days-years:10146} ago. Symptoms or injury {did/did not:14019} occur at work. Pain is {hernia sx:13577} and {is/is not:9024} reducible. The patient has no symptoms of  {hernia etiology:13578::"chronic constipation","chronic cough","difficulty urinating"}. There {hpi assoc has/has/not:15037} previous hx of groin surgery.      Discharged Condition: {condition:18240}  Hospital Course: ***  Consults:  @CONSULT @  Significant Diagnostic Studies: CBC    Component Value Date/Time   WBC 6.2 02/10/2012 0605   RBC 3.77* 02/10/2012 0605   HGB 11.4* 02/10/2012 0605   HCT 35.0* 02/10/2012 0605   PLT 176 02/10/2012 0605   MCV 92.8 02/10/2012 0605   MCH 30.2 02/10/2012 0605   MCHC 32.6 02/10/2012 0605   RDW 18.0* 02/10/2012 0605   LYMPHSABS 1.7 02/08/2012 0046   MONOABS 0.8 02/08/2012 0046   EOSABS 0.1 02/08/2012 0046   BASOSABS 0.0 02/08/2012 0046    BMET    Component Value Date/Time   NA 137 02/10/2012 0605   K 3.4* 02/10/2012 0605   CL 99  02/10/2012 0605   CO2 28 02/10/2012 0605   GLUCOSE 67* 02/10/2012 0605   BUN 17 02/10/2012 0605   CREATININE 1.09 02/10/2012 0605   CREATININE 2.17* 11/11/2011 1018   CALCIUM 8.7 02/10/2012 0605   GFRNONAA 49* 02/10/2012 0605   GFRAA 57* 02/10/2012 0605    COAG Lab Results  Component Value Date   INR 1.07 12/16/2011   INR 1.09 12/15/2011   INR 1.13 12/14/2011   No results found for this basename: PTT    Disposition: 01-Home or Self Care   Med List    Warning       Cannot display discharge medications because this is not an admission.              SignedJosephina Gip 03/01/2012, 9:48 AM    Review of Systems     Objective:   Physical ExamBP 135/53  Pulse 64  Temp(Src) 97.6 F (36.4 C) (Oral)  Ht 5\' 3"  (1.6 m)  Wt 150 lb (68.04 kg)  BMI 26.57 kg/m2  SpO2 100%  General well-developed well-nourished female in no apparent distress alert and oriented x3 Lungs no rhonchi or wheezing Abdomen soft nontender. Well-healed midline incision with no evidence of ventral hernia. Extremities with 3+ femoral and popliteal pulses bilaterally. Well perfused lower extremities.     Assessment:     Doing well post abdominal aortic aneurysm resection with proximal aortic endarterectomy.-Also history of left carotid endarterectomy    Plan:     Return in one year with #1  carotid duplex exam #2 ABIs #3 aorto iliac duplex scan status post abdominal aortic aneurysm resection See nurse practitioner upon return

## 2012-03-02 NOTE — Progress Notes (Signed)
Addended by: Sharee Pimple on: 03/02/2012 09:09 AM   Modules accepted: Orders

## 2012-03-03 DIAGNOSIS — C44320 Squamous cell carcinoma of skin of unspecified parts of face: Secondary | ICD-10-CM | POA: Diagnosis not present

## 2012-03-30 DIAGNOSIS — E119 Type 2 diabetes mellitus without complications: Secondary | ICD-10-CM | POA: Diagnosis not present

## 2012-03-30 DIAGNOSIS — E785 Hyperlipidemia, unspecified: Secondary | ICD-10-CM | POA: Diagnosis not present

## 2012-03-30 DIAGNOSIS — I10 Essential (primary) hypertension: Secondary | ICD-10-CM | POA: Diagnosis not present

## 2012-03-30 DIAGNOSIS — E039 Hypothyroidism, unspecified: Secondary | ICD-10-CM | POA: Diagnosis not present

## 2012-04-01 ENCOUNTER — Emergency Department (HOSPITAL_COMMUNITY): Payer: Medicare Other

## 2012-04-01 ENCOUNTER — Inpatient Hospital Stay (HOSPITAL_COMMUNITY)
Admission: EM | Admit: 2012-04-01 | Discharge: 2012-04-04 | DRG: 871 | Disposition: A | Discharge status: Home Health | Payer: Medicare Other | Attending: Internal Medicine | Admitting: Internal Medicine

## 2012-04-01 ENCOUNTER — Encounter (HOSPITAL_COMMUNITY): Payer: Medicare Other

## 2012-04-01 ENCOUNTER — Inpatient Hospital Stay (HOSPITAL_COMMUNITY): Payer: Medicare Other

## 2012-04-01 ENCOUNTER — Encounter (HOSPITAL_COMMUNITY): Payer: Self-pay | Admitting: *Deleted

## 2012-04-01 DIAGNOSIS — Z886 Allergy status to analgesic agent status: Secondary | ICD-10-CM | POA: Diagnosis not present

## 2012-04-01 DIAGNOSIS — I10 Essential (primary) hypertension: Secondary | ICD-10-CM | POA: Diagnosis present

## 2012-04-01 DIAGNOSIS — E872 Acidosis, unspecified: Secondary | ICD-10-CM | POA: Diagnosis present

## 2012-04-01 DIAGNOSIS — R197 Diarrhea, unspecified: Secondary | ICD-10-CM | POA: Diagnosis not present

## 2012-04-01 DIAGNOSIS — Z87891 Personal history of nicotine dependence: Secondary | ICD-10-CM | POA: Diagnosis not present

## 2012-04-01 DIAGNOSIS — A414 Sepsis due to anaerobes: Secondary | ICD-10-CM | POA: Diagnosis present

## 2012-04-01 DIAGNOSIS — G9340 Encephalopathy, unspecified: Secondary | ICD-10-CM | POA: Diagnosis present

## 2012-04-01 DIAGNOSIS — Z8249 Family history of ischemic heart disease and other diseases of the circulatory system: Secondary | ICD-10-CM

## 2012-04-01 DIAGNOSIS — R4182 Altered mental status, unspecified: Secondary | ICD-10-CM | POA: Diagnosis not present

## 2012-04-01 DIAGNOSIS — Z8673 Personal history of transient ischemic attack (TIA), and cerebral infarction without residual deficits: Secondary | ICD-10-CM

## 2012-04-01 DIAGNOSIS — E860 Dehydration: Secondary | ICD-10-CM | POA: Diagnosis not present

## 2012-04-01 DIAGNOSIS — Z96659 Presence of unspecified artificial knee joint: Secondary | ICD-10-CM | POA: Diagnosis not present

## 2012-04-01 DIAGNOSIS — A419 Sepsis, unspecified organism: Secondary | ICD-10-CM | POA: Diagnosis not present

## 2012-04-01 DIAGNOSIS — R1110 Vomiting, unspecified: Secondary | ICD-10-CM | POA: Diagnosis present

## 2012-04-01 DIAGNOSIS — Z96649 Presence of unspecified artificial hip joint: Secondary | ICD-10-CM | POA: Diagnosis not present

## 2012-04-01 DIAGNOSIS — Z880 Allergy status to penicillin: Secondary | ICD-10-CM

## 2012-04-01 DIAGNOSIS — E039 Hypothyroidism, unspecified: Secondary | ICD-10-CM | POA: Diagnosis present

## 2012-04-01 DIAGNOSIS — Z79899 Other long term (current) drug therapy: Secondary | ICD-10-CM

## 2012-04-01 DIAGNOSIS — N179 Acute kidney failure, unspecified: Secondary | ICD-10-CM | POA: Diagnosis present

## 2012-04-01 DIAGNOSIS — R6520 Severe sepsis without septic shock: Secondary | ICD-10-CM | POA: Diagnosis present

## 2012-04-01 DIAGNOSIS — I739 Peripheral vascular disease, unspecified: Secondary | ICD-10-CM | POA: Diagnosis present

## 2012-04-01 DIAGNOSIS — R079 Chest pain, unspecified: Secondary | ICD-10-CM | POA: Diagnosis not present

## 2012-04-01 DIAGNOSIS — E119 Type 2 diabetes mellitus without complications: Secondary | ICD-10-CM | POA: Diagnosis present

## 2012-04-01 DIAGNOSIS — Z452 Encounter for adjustment and management of vascular access device: Secondary | ICD-10-CM | POA: Diagnosis not present

## 2012-04-01 DIAGNOSIS — A0472 Enterocolitis due to Clostridium difficile, not specified as recurrent: Secondary | ICD-10-CM | POA: Diagnosis present

## 2012-04-01 DIAGNOSIS — E876 Hypokalemia: Secondary | ICD-10-CM | POA: Diagnosis present

## 2012-04-01 DIAGNOSIS — G9341 Metabolic encephalopathy: Secondary | ICD-10-CM | POA: Diagnosis not present

## 2012-04-01 DIAGNOSIS — IMO0002 Reserved for concepts with insufficient information to code with codable children: Secondary | ICD-10-CM

## 2012-04-01 DIAGNOSIS — I359 Nonrheumatic aortic valve disorder, unspecified: Secondary | ICD-10-CM | POA: Diagnosis not present

## 2012-04-01 DIAGNOSIS — R0602 Shortness of breath: Secondary | ICD-10-CM | POA: Diagnosis not present

## 2012-04-01 DIAGNOSIS — K219 Gastro-esophageal reflux disease without esophagitis: Secondary | ICD-10-CM | POA: Diagnosis present

## 2012-04-01 DIAGNOSIS — Z7982 Long term (current) use of aspirin: Secondary | ICD-10-CM

## 2012-04-01 DIAGNOSIS — A413 Sepsis due to Hemophilus influenzae: Secondary | ICD-10-CM | POA: Diagnosis not present

## 2012-04-01 DIAGNOSIS — E230 Hypopituitarism: Secondary | ICD-10-CM | POA: Diagnosis present

## 2012-04-01 DIAGNOSIS — E785 Hyperlipidemia, unspecified: Secondary | ICD-10-CM | POA: Diagnosis not present

## 2012-04-01 DIAGNOSIS — I6789 Other cerebrovascular disease: Secondary | ICD-10-CM | POA: Diagnosis not present

## 2012-04-01 HISTORY — DX: Enterocolitis due to Clostridium difficile, not specified as recurrent: A04.72

## 2012-04-01 LAB — COMPREHENSIVE METABOLIC PANEL
Alkaline Phosphatase: 62 U/L (ref 39–117)
BUN: 39 mg/dL — ABNORMAL HIGH (ref 6–23)
Calcium: 9.3 mg/dL (ref 8.4–10.5)
GFR calc Af Amer: 18 mL/min — ABNORMAL LOW (ref >90)
Glucose, Bld: 97 mg/dL (ref 70–99)
Total Protein: 7.4 g/dL (ref 6.0–8.3)

## 2012-04-01 LAB — BLOOD GAS, ARTERIAL
Bicarbonate: 19.4 mEq/L — ABNORMAL LOW (ref 20.0–24.0)
Drawn by: 21340
O2 Content: 2 L/min
O2 Saturation: 96.9 %
Patient temperature: 37
pH, Arterial: 7.472 — ABNORMAL HIGH (ref 7.350–7.400)

## 2012-04-01 LAB — TYPE AND SCREEN: ABO/RH(D): A POS

## 2012-04-01 LAB — CBC
Hemoglobin: 13.8 g/dL (ref 12.0–15.0)
MCH: 30.7 pg (ref 26.0–34.0)
MCV: 93.5 fL (ref 78.0–100.0)
RBC: 4.49 MIL/uL (ref 3.87–5.11)

## 2012-04-01 LAB — DIFFERENTIAL
Eosinophils Absolute: 0 10*3/uL (ref 0.0–0.7)
Eosinophils Relative: 0 % (ref 0–5)
Lymphs Abs: 0.9 10*3/uL (ref 0.7–4.0)
Monocytes Absolute: 1.3 10*3/uL — ABNORMAL HIGH (ref 0.1–1.0)
Monocytes Relative: 11 % (ref 3–12)

## 2012-04-01 LAB — GLUCOSE, CAPILLARY
Glucose-Capillary: 115 mg/dL — ABNORMAL HIGH (ref 70–99)
Glucose-Capillary: 121 mg/dL — ABNORMAL HIGH (ref 70–99)
Glucose-Capillary: 123 mg/dL — ABNORMAL HIGH (ref 70–99)
Glucose-Capillary: 150 mg/dL — ABNORMAL HIGH (ref 70–99)

## 2012-04-01 LAB — URINALYSIS, ROUTINE W REFLEX MICROSCOPIC
Glucose, UA: NEGATIVE mg/dL
Leukocytes, UA: NEGATIVE
Nitrite: NEGATIVE
pH: 5.5 (ref 5.0–8.0)

## 2012-04-01 LAB — TROPONIN I: Troponin I: <0.3 ng/mL (ref <0.30)

## 2012-04-01 LAB — CK: Total CK: 232 U/L — ABNORMAL HIGH (ref 7–177)

## 2012-04-01 LAB — HEMOGLOBIN A1C
Hgb A1c MFr Bld: 5.7 % — ABNORMAL HIGH (ref <5.7)
Mean Plasma Glucose: 117 mg/dL — ABNORMAL HIGH (ref <117)

## 2012-04-01 LAB — RAPID URINE DRUG SCREEN, HOSP PERFORMED
Benzodiazepines: NOT DETECTED
Cocaine: NOT DETECTED

## 2012-04-01 LAB — CARDIAC PANEL(CRET KIN+CKTOT+MB+TROPI)
CK, MB: 4.7 ng/mL — ABNORMAL HIGH (ref 0.3–4.0)
Total CK: 273 U/L — ABNORMAL HIGH (ref 7–177)

## 2012-04-01 MED ORDER — ACETAMINOPHEN 650 MG RE SUPP
650.0000 mg | Freq: Once | RECTAL | Status: AC
  Administered 2012-04-01: 650 mg via RECTAL
  Filled 2012-04-01: qty 1

## 2012-04-01 MED ORDER — SODIUM CHLORIDE 0.9 % IV BOLUS (SEPSIS)
1000.0000 mL | Freq: Once | INTRAVENOUS | Status: AC
Start: 2012-04-01 — End: 2012-04-01
  Administered 2012-04-01: 1000 mL via INTRAVENOUS

## 2012-04-01 MED ORDER — SODIUM CHLORIDE 0.9 % IJ SOLN
10.0000 mL | Freq: Two times a day (BID) | INTRAMUSCULAR | Status: DC
  Administered 2012-04-01: 20 mL
  Administered 2012-04-02 – 2012-04-04 (×5): 10 mL
  Filled 2012-04-01 (×5): qty 3
  Filled 2012-04-01: qty 6

## 2012-04-01 MED ORDER — VANCOMYCIN 50 MG/ML ORAL SOLUTION
500.0000 mg | Freq: Four times a day (QID) | ORAL | Status: DC
  Administered 2012-04-01 – 2012-04-04 (×13): 500 mg via ORAL
  Filled 2012-04-01 (×21): qty 10

## 2012-04-01 MED ORDER — POTASSIUM CHLORIDE IN NACL 40-0.9 MEQ/L-% IV SOLN
INTRAVENOUS | Status: DC
  Administered 2012-04-01 – 2012-04-03 (×6): via INTRAVENOUS
  Filled 2012-04-01 (×10): qty 1000

## 2012-04-01 MED ORDER — SODIUM CHLORIDE 0.9 % IV SOLN
INTRAVENOUS | Status: AC
  Administered 2012-04-01: 1000 mL/h via INTRAVENOUS
  Filled 2012-04-01: qty 1000

## 2012-04-01 MED ORDER — HYDROCORTISONE SOD SUCCINATE 100 MG IJ SOLR
100.0000 mg | Freq: Three times a day (TID) | INTRAMUSCULAR | Status: DC
  Administered 2012-04-01 – 2012-04-02 (×3): 100 mg via INTRAVENOUS
  Filled 2012-04-01 (×3): qty 2

## 2012-04-01 MED ORDER — SOMATROPIN 5 MG IJ SOLR
0.3000 mg | Freq: Every evening | INTRAMUSCULAR | Status: DC
  Administered 2012-04-01 – 2012-04-04 (×4): 0.3 mg via SUBCUTANEOUS
  Filled 2012-04-01 (×5): qty 1

## 2012-04-01 MED ORDER — METRONIDAZOLE IN NACL 5-0.79 MG/ML-% IV SOLN
500.0000 mg | Freq: Three times a day (TID) | INTRAVENOUS | Status: DC
  Administered 2012-04-01 – 2012-04-02 (×3): 500 mg via INTRAVENOUS
  Filled 2012-04-01 (×9): qty 100

## 2012-04-01 MED ORDER — ENOXAPARIN SODIUM 30 MG/0.3ML ~~LOC~~ SOLN
30.0000 mg | SUBCUTANEOUS | Status: DC
  Administered 2012-04-01 – 2012-04-03 (×3): 30 mg via SUBCUTANEOUS
  Filled 2012-04-01 (×2): qty 0.3

## 2012-04-01 MED ORDER — SODIUM CHLORIDE 0.9 % IV BOLUS (SEPSIS)
1000.0000 mL | Freq: Once | INTRAVENOUS | Status: AC
  Administered 2012-04-01: 1000 mL/h via INTRAVENOUS

## 2012-04-01 MED ORDER — PIPERACILLIN-TAZOBACTAM 3.375 G IVPB 30 MIN
3.3750 g | Freq: Four times a day (QID) | INTRAVENOUS | Status: DC
  Administered 2012-04-01: 3.375 g via INTRAVENOUS
  Filled 2012-04-01 (×13): qty 50

## 2012-04-01 MED ORDER — INSULIN ASPART 100 UNIT/ML ~~LOC~~ SOLN
0.0000 [IU] | SUBCUTANEOUS | Status: DC
  Administered 2012-04-01 – 2012-04-02 (×3): 2 [IU] via SUBCUTANEOUS

## 2012-04-01 MED ORDER — ACETAMINOPHEN 650 MG RE SUPP: 650.0000 mg | Freq: Four times a day (QID) | RECTAL | Status: DC | PRN

## 2012-04-01 MED ORDER — PIPERACILLIN-TAZOBACTAM 3.375 G IVPB
3.3750 g | Freq: Once | INTRAVENOUS | Status: AC
  Administered 2012-04-01: 3.375 g via INTRAVENOUS
  Filled 2012-04-01: qty 50

## 2012-04-01 MED ORDER — VANCOMYCIN HCL IN DEXTROSE 1-5 GM/200ML-% IV SOLN
1000.0000 mg | INTRAVENOUS | Status: DC
  Administered 2012-04-01: 1000 mg via INTRAVENOUS
  Filled 2012-04-01 (×2): qty 200

## 2012-04-01 MED ORDER — ONDANSETRON HCL 4 MG/2ML IJ SOLN
4.0000 mg | Freq: Four times a day (QID) | INTRAMUSCULAR | Status: DC | PRN
  Administered 2012-04-01: 4 mg via INTRAVENOUS
  Filled 2012-04-01: qty 2

## 2012-04-01 MED ORDER — TRAMADOL HCL 50 MG PO TABS
50.0000 mg | ORAL_TABLET | Freq: Four times a day (QID) | ORAL | Status: DC | PRN
  Administered 2012-04-01 – 2012-04-04 (×4): 50 mg via ORAL
  Filled 2012-04-01 (×4): qty 1

## 2012-04-01 MED ORDER — SODIUM CHLORIDE 0.9 % IJ SOLN
10.0000 mL | INTRAMUSCULAR | Status: DC | PRN
  Administered 2012-04-03: 10 mL
  Filled 2012-04-01: qty 3

## 2012-04-01 MED ORDER — HYDROCORTISONE SOD SUCCINATE 100 MG IJ SOLR
100.0000 mg | Freq: Once | INTRAMUSCULAR | Status: AC
  Administered 2012-04-01: 100 mg via INTRAVENOUS
  Filled 2012-04-01: qty 2

## 2012-04-01 MED ORDER — ONDANSETRON HCL 4 MG PO TABS
4.0000 mg | ORAL_TABLET | Freq: Four times a day (QID) | ORAL | Status: DC | PRN
  Administered 2012-04-02: 4 mg via ORAL
  Filled 2012-04-01: qty 1

## 2012-04-01 MED ORDER — SODIUM CHLORIDE 0.9 % IV BOLUS (SEPSIS)
1000.0000 mL | Freq: Once | INTRAVENOUS | Status: AC
  Administered 2012-04-01: 1000 mL via INTRAVENOUS

## 2012-04-01 MED ORDER — HYDROCORTISONE SOD SUCCINATE 100 MG IJ SOLR: 100.0000 mg | Freq: Three times a day (TID) | INTRAMUSCULAR | Status: DC

## 2012-04-01 MED ORDER — PIPERACILLIN-TAZOBACTAM 3.375 G IVPB 30 MIN
3.3750 g | Freq: Three times a day (TID) | INTRAVENOUS | Status: DC
  Filled 2012-04-01: qty 50

## 2012-04-01 MED ORDER — ACETAMINOPHEN 325 MG PO TABS: 650.0000 mg | ORAL_TABLET | Freq: Four times a day (QID) | ORAL | Status: DC | PRN

## 2012-04-01 NOTE — ED Notes (Signed)
Pt arouses to verbal stimuli and speaks but I am unable to understand her. Skin warm and dry. Color flushed. No vomiting or diarrhea at this time.  IV infusing with no edema or redness. Pt showing NSR on cardiac monitor. Foley catheter draining small amount clear yellow urine. Husband at bedside. C/o pain in her legs. Vital signs stable at this time.

## 2012-04-01 NOTE — Progress Notes (Signed)
We have received a PT consult initiated by RN.  Pt is currently having a PICC line placed.  According to RN, pt had aortic bypass surgery done in March 2013 and has been severely weak since that point.  Family has been concerned about her weakness.  Now she is admitted with sepsis and is acutely ill.  Multiple medical procedures are planned for today.  The initiation of PT is premature at this time. We will wait until Monday and reassess.

## 2012-04-01 NOTE — ED Notes (Signed)
Pt being transported to ICU via stretcher with portable 02 and cardiac monitor.

## 2012-04-01 NOTE — ED Notes (Signed)
Dr. Kerry Hough in to see patient.

## 2012-04-01 NOTE — Progress Notes (Signed)
ANTIBIOTIC CONSULT NOTE - INITIAL  Pharmacy Consult for Vancomycin & Zosyn Indication: rule out sepsis  Allergies  Allergen Reactions  . Codeine Nausea Only  . Dilaudid (Hydromorphone Hcl) Nausea Only  . Lincomycin Hcl Other (See Comments)    Big bumps bilateral legs  . Macrodantin Hives  . Zolpidem Tartrate Other (See Comments)    Stroke  . Azithromycin Rash  . Penicillins Rash    Patient Measurements: Height: 5\' 4"  (162.6 cm) Weight: 145 lb 1 oz (65.8 kg) IBW/kg (Calculated) : 54.7   Vital Signs: Temp: 97.8 F (36.6 C) (06/28 0905) Temp src: Oral (06/28 0905) BP: 102/39 mmHg (06/28 0905) Pulse Rate: 79  (06/28 0830) Intake/Output from previous day: 06/27 0701 - 06/28 0700 In: 50 [IV Piggyback:50] Out: -  Intake/Output from this shift: Total I/O In: 3000 [Other:3000] Out: -   Labs:  Basename 04/01/12 0447  WBC 12.0*  HGB 13.8  PLT 186  LABCREA --  CREATININE 2.84*   Estimated Creatinine Clearance: 16.2 ml/min (by C-G formula based on Cr of 2.84). No results found for this basename: VANCOTROUGH:2,VANCOPEAK:2,VANCORANDOM:2,GENTTROUGH:2,GENTPEAK:2,GENTRANDOM:2,TOBRATROUGH:2,TOBRAPEAK:2,TOBRARND:2,AMIKACINPEAK:2,AMIKACINTROU:2,AMIKACIN:2, in the last 72 hours   Microbiology: Recent Results (from the past 720 hour(s))  CULTURE, BLOOD (ROUTINE X 2)     Status: Normal (Preliminary result)   Collection Time   04/01/12  4:46 AM      Component Value Range Status Comment   Specimen Description Blood LEFT ARM   Final    Special Requests NONE 6CC   Final    Culture PENDING   Incomplete    Report Status PENDING   Incomplete   CULTURE, BLOOD (ROUTINE X 2)     Status: Normal (Preliminary result)   Collection Time   04/01/12  4:49 AM      Component Value Range Status Comment   Specimen Description Blood RIGHT ARM   Final    Special Requests NONE [facility]   Final    Culture PENDING   Incomplete    Report Status PENDING   Incomplete     Medical History: Past Medical  History  Diagnosis Date  . Hypertension   . Hypothyroidism   . Stroke     Ischemic x 2       last one 2007  . Diabetes mellitus     type 11  . Endocrine disturbance     gland has not  worked until 1970.Marland Kitchen..dx in 1977  . Blood transfusion     back in 1973  . GERD (gastroesophageal reflux disease)     on meds to help....meds do well   . Sheehan syndrome 1977  . AAA (abdominal aortic aneurysm)     S/p aortic repair and endarterectomy 12/2011   . Carotid stenosis, left     S/p left CEA 09/2008  . PVD (peripheral vascular disease)     Medications:  Scheduled:    . acetaminophen  650 mg Rectal Once  . enoxaparin  30 mg Subcutaneous Q24H  . hydrocortisone sod succinate (SOLU-CORTEF) injection  100 mg Intravenous Once  . hydrocortisone sod succinate (SOLU-CORTEF) injection  100 mg Intravenous Q8H  . piperacillin-tazobactam (ZOSYN)  IV  3.375 g Intravenous Once  . piperacillin-tazobactam  3.375 g Intravenous Q8H  . sodium chloride  1,000 mL Intravenous Once  . sodium chloride  1,000 mL Intravenous Once  . sodium chloride  1,000 mL Intravenous Once  . Somatropin  0.3 mg Subcutaneous QPM  . vancomycin  1,000 mg Intravenous Q48H  . DISCONTD: hydrocortisone sod succinate (  SOLU-CORTEF) injection  100 mg Intravenous Q8H   Assessment: 76 yo F admitted with fever started on empiric, broad-spectrum antibiotics while rule-out sepsis.   Acute renal dysfunction noted. Will adjust antibiotic doses accordingly.  Allergies to penicillin and lincomycin noted. She received Zosyn 3.375gm IV in ED ~ 0530 and no adverse reactions noted.  Per RN report, family in the room currently is unable to discuss allergy information. She will ask husband for more detailed information when he returns.  Discussed above with Dr Kerry Hough. Will continue Vancomycin and Zosyn as ordered for now.    Goal of Therapy:  Vancomycin trough level 15-20 mcg/ml  Plan:  1) Zosyn 3.375gm IV every 6 hours 2) Vancomycin 1gm IV  Q48h 3) Check Vancomycin trough at steady state 4) Monitor renal function and cx data   Elson Clan 04/01/2012,10:48 AM

## 2012-04-01 NOTE — Progress Notes (Signed)
CRITICAL VALUE ALERT  Critical value received:  POSITIVE C-diff  Date of notification:  04/01/12  Time of notification:  1343  Critical value read back:yes  Nurse who received alert:  M. Winona Legato, RN  MD notified (1st page):  Dr. Kerry Hough  Time of first page:  1343  MD notified (2nd page):  Time of second page:  Responding MD:  Dr. Kerry Hough  Time MD responded:  [id number]

## 2012-04-01 NOTE — H&P (Signed)
PCP:   Dwana Melena, MD   Chief Complaint:  Altered mental status  HPI: This is a 76 year old female with a history of Sheehan syndrome, who was recently in the hospital at the beginning of May for chest pain. She also has a history of abdominal aortic aneurysm and had undergone surgical repair in March. Patient lives at home with her husband and was in her usual state of health when he describes on Wednesday that the patient began to feel very weak and having diffuse muscle aches. He reports the following day she began to have vomiting and diarrhea. She had frequent episodes of both was unable to tolerate anything by mouth. As the day progressed her mental status began to deteriorate. She became increasingly weak and difficult to arouse. According to the husband she has not had any significant cough or shortness of breath. She has not complained of any dysuria. She did complain of some chest pain but her husband feels that this may be due to the persistent vomiting she was experiencing. She was brought to the emergency room where she was noted to be febrile with temperature 104 and hypotensive. The patient has been admitted for workup of sepsis.  Allergies:   Allergies  Allergen Reactions  . Codeine Nausea Only  . Dilaudid (Hydromorphone Hcl) Nausea Only  . Lincomycin Hcl Other (See Comments)    Big bumps bilateral legs  . Macrodantin Hives  . Zolpidem Tartrate Other (See Comments)    Stroke  . Azithromycin Rash  . Penicillins Rash      Past Medical History  Diagnosis Date  . Hypertension   . Hypothyroidism   . Stroke     Ischemic x 2       last one 2007  . Diabetes mellitus     type 11  . Endocrine disturbance     gland has not  worked until 1970.Marland Kitchen..dx in 1977  . Blood transfusion     back in 1973  . GERD (gastroesophageal reflux disease)     on meds to help....meds do well   . Sheehan syndrome 1977  . AAA (abdominal aortic aneurysm)     S/p aortic repair and endarterectomy  12/2011   . Carotid stenosis, left     S/p left CEA 09/2008  . PVD (peripheral vascular disease)     Past Surgical History  Procedure Date  . Spine surgery     Lumbar Diskectomy  . Joint replacement May 2009    Right knee  . Joint replacement April 2008    Right Hip prothesis  . Carotid endarterectomy Dec. 2009  . Cardiac catheterization     1980  &  2009  . Appendectomy     1960  . Cesarean section   . Back surgery     1990's  . Abdominal hysterectomy 1973    1973  . Fracture surgery     left ankle   . Abdominal aortic aneurysm repair 12/09/2011    Procedure: ANEURYSM ABDOMINAL AORTIC REPAIR;  Surgeon: Pryor Ochoa, MD;  Location: [facility] OR;  Service: Vascular;  Laterality: N/A;  resection and grafting abdominal aortic aneurysm; Insertion Bilateral External Iliac Graft; Reexploration Proximal Anastemosis; Aortic Endarterectomy; Reimplantation Inferior Mesenteric Artery; Intraaortic Graft  . Abdominal aortic aneurysm repair     Prior to Admission medications   Medication Sig Start Date End Date Taking? Authorizing Provider  alendronate (FOSAMAX) 70 MG tablet Take 70 mg by mouth every 7 (seven) days. Take with a full glass  of water on an empty stomach.   Yes Historical Provider, MD  amitriptyline (ELAVIL) 25 MG tablet Take 25 mg by mouth at bedtime.   Yes Historical Provider, MD  aspirin 81 MG tablet Take 81 mg by mouth daily.   Yes Historical Provider, MD  atenolol (TENORMIN) 50 MG tablet Take 50 mg by mouth daily.   Yes Historical Provider, MD  cholecalciferol (VITAMIN D) 1000 UNITS tablet Take 3,000 Units by mouth daily.   Yes Historical Provider, MD  diltiazem (CARDIZEM CD) 240 MG 24 hr capsule Take 240 mg by mouth daily.   Yes Historical Provider, MD  dipyridamole-aspirin (AGGRENOX) 25-200 MG per 12 hr capsule Take 1 capsule by mouth 2 (two) times daily.   Yes Historical Provider, MD  esomeprazole (NEXIUM) 40 MG capsule Take 40 mg by mouth daily before breakfast.   Yes  Historical Provider, MD  hydrocortisone (CORTEF) 20 MG tablet Take 10-20 mg by mouth daily. 20 mg in the morning and 10 mg in the evening   Yes Historical Provider, MD  levothyroxine (SYNTHROID, LEVOTHROID) 75 MCG tablet Take 75 mcg by mouth daily.   Yes Historical Provider, MD  pioglitazone (ACTOS) 45 MG tablet Take 45 mg by mouth daily.   Yes Historical Provider, MD  potassium citrate (UROCIT-K) 10 MEQ (1080 MG) SR tablet Take 10 mEq by mouth 2 (two) times daily.   Yes Historical Provider, MD  rosuvastatin (CRESTOR) 5 MG tablet Take 5 mg by mouth daily.   Yes Historical Provider, MD  Somatropin (HUMATROPE) 5 MG SOLR Inject 0.3 mg into the skin every evening.   Yes Historical Provider, MD  traMADol (ULTRAM) 50 MG tablet Take 50 mg by mouth every 6 (six) hours as needed.   Yes Historical Provider, MD  acetaminophen (TYLENOL) 325 MG tablet Take 650 mg by mouth every 6 (six) hours as needed. 02/09/12 02/08/13  Laveda Norman, MD  glucose monitoring kit (FREESTYLE) monitoring kit 1 each by Does not apply route as needed.    Historical Provider, MD    Social History:  reports that she quit smoking about 48 years ago. Her smoking use included Cigarettes. She quit after 4 years of use. She has never used smokeless tobacco. She reports that she does not drink alcohol or use illicit drugs.  Family History  Problem Relation Age of Onset  . Heart disease Mother   . Peripheral vascular disease Mother   . Alcohol abuse Father   . Heart disease Sister   . Heart disease Brother     Review of Systems: Positives in bold Constitutional: Denies fever, chills, diaphoresis, appetite change and fatigue.  HEENT: Denies photophobia, eye pain, redness, hearing loss, ear pain, congestion, sore throat, rhinorrhea, sneezing, mouth sores, trouble swallowing, neck pain, neck stiffness and tinnitus.   Respiratory: Denies SOB, DOE, cough, chest tightness,  and wheezing.   Cardiovascular: Denies chest pain, palpitations and  leg swelling.  Gastrointestinal: Denies nausea, vomiting, abdominal pain, diarrhea, constipation, blood in stool and abdominal distention.  Genitourinary: Denies dysuria, urgency, frequency, hematuria, flank pain and difficulty urinating.  Musculoskeletal: Denies myalgias, back pain, joint swelling, arthralgias and gait problem.  Skin: Denies pallor, rash and wound.  Neurological: Denies dizziness, seizures, syncope, weakness, light-headedness, numbness and headaches.  Hematological: Denies adenopathy. Easy bruising, personal or family bleeding history  Psychiatric/Behavioral: Denies suicidal ideation, mood changes, confusion, nervousness, sleep disturbance and agitation   Physical Exam: Blood pressure 118/45, pulse 79, temperature 99.6 F (37.6 C), temperature source Oral, resp.  rate 20, height 5\' 4"  (1.626 m), weight 66.225 kg (146 lb), SpO2 96.00%. General: Patient is lying in bed, she appears to be acutely ill, she is very somnolent but awakens to voice HEENT: Normocephalic, atraumatic, mucous membranes are very dry, pupils are equal round react to light Neck: Supple, no signs of meningismus Chest: Clear to auscultation bilaterally Cardiac: S1, S2, regular rate and rhythm Abdomen: Soft, nontender, bowel sounds are active, nondistended Extremities: No cyanosis, clubbing, edema Neurologic: Grossly intact, does not appear to be focal Skin: Cool, no visible rashes or sores  Labs on Admission:  Results for orders placed during the hospital encounter of 04/01/12 (from the past 48 hour(s))  GLUCOSE, CAPILLARY     Status: Abnormal   Collection Time   04/01/12  4:44 AM      Component Value Range Comment   Glucose-Capillary 101 (*) 70 - 99 mg/dL   TYPE AND SCREEN     Status: Normal   Collection Time   04/01/12  4:46 AM      Component Value Range Comment   ABO/RH(D) A POS      Antibody Screen NEG      Sample Expiration 04/04/2012     CULTURE, BLOOD (ROUTINE X 2)     Status: Normal  (Preliminary result)   Collection Time   04/01/12  4:46 AM      Component Value Range Comment   Specimen Description Blood LEFT ARM      Special Requests NONE [place]      Culture PENDING      Report Status PENDING     LACTIC ACID, PLASMA     Status: Abnormal   Collection Time   04/01/12  4:46 AM      Component Value Range Comment   Lactic Acid, Venous 2.8 (*) 0.5 - 2.2 mmol/L   PROTIME-INR     Status: Abnormal   Collection Time   04/01/12  4:47 AM      Component Value Range Comment   Prothrombin Time 16.7 (*) 11.6 - 15.2 seconds    INR 1.33  0.00 - 1.49   CBC     Status: Abnormal   Collection Time   04/01/12  4:47 AM      Component Value Range Comment   WBC 12.0 (*) 4.0 - 10.5 K/uL    RBC 4.49  3.87 - 5.11 MIL/uL    Hemoglobin 13.8  12.0 - 15.0 g/dL    HCT 47.8  29.5 - 62.1 %    MCV 93.5  78.0 - 100.0 fL    MCH 30.7  26.0 - 34.0 pg    MCHC 32.9  30.0 - 36.0 g/dL    RDW 30.8 (*) 65.7 - 15.5 %    Platelets 186  150 - 400 K/uL   DIFFERENTIAL     Status: Abnormal   Collection Time   04/01/12  4:47 AM      Component Value Range Comment   Neutrophils Relative 81 (*) 43 - 77 %    Neutro Abs 9.7 (*) 1.7 - 7.7 K/uL    Lymphocytes Relative 8 (*) 12 - 46 %    Lymphs Abs 0.9  0.7 - 4.0 K/uL    Monocytes Relative 11  3 - 12 %    Monocytes Absolute 1.3 (*) 0.1 - 1.0 K/uL    Eosinophils Relative 0  0 - 5 %    Eosinophils Absolute 0.0  0.0 - 0.7 K/uL    Basophils Relative 0  0 - 1 %    Basophils Absolute 0.0  0.0 - 0.1 K/uL   COMPREHENSIVE METABOLIC PANEL     Status: Abnormal   Collection Time   04/01/12  4:47 AM      Component Value Range Comment   Sodium 135  135 - 145 mEq/L    Potassium 3.1 (*) 3.5 - 5.1 mEq/L    Chloride 94 (*) 96 - 112 mEq/L    CO2 18 (*) 19 - 32 mEq/L    Glucose, Bld 97  70 - 99 mg/dL    BUN 39 (*) 6 - 23 mg/dL    Creatinine, Ser 8.11 (*) 0.50 - 1.10 mg/dL    Calcium 9.3  8.4 - 91.4 mg/dL    Total Protein 7.4  6.0 - 8.3 g/dL    Albumin 3.6  3.5 - 5.2 g/dL     AST 24  0 - 37 U/L    ALT 8  0 - 35 U/L    Alkaline Phosphatase 62  39 - 117 U/L    Total Bilirubin 0.7  0.3 - 1.2 mg/dL    GFR calc non Af Amer 15 (*) >90 mL/min    GFR calc Af Amer 18 (*) >90 mL/min   TROPONIN I     Status: Normal   Collection Time   04/01/12  4:47 AM      Component Value Range Comment   Troponin I <0.30  <0.30 ng/mL   ETHANOL     Status: Normal   Collection Time   04/01/12  4:47 AM      Component Value Range Comment   Alcohol, Ethyl (B) <11  0 - 11 mg/dL   LIPASE, BLOOD     Status: Abnormal   Collection Time   04/01/12  4:47 AM      Component Value Range Comment   Lipase 6 (*) 11 - 59 U/L   CULTURE, BLOOD (ROUTINE X 2)     Status: Normal (Preliminary result)   Collection Time   04/01/12  4:49 AM      Component Value Range Comment   Specimen Description Blood RIGHT ARM      Special Requests NONE 6CC      Culture PENDING      Report Status PENDING     CK     Status: Abnormal   Collection Time   04/01/12  5:01 AM      Component Value Range Comment   Total CK 232 (*) 7 - 177 U/L   BLOOD GAS, ARTERIAL     Status: Abnormal   Collection Time   04/01/12  5:09 AM      Component Value Range Comment   O2 Content 2.0      Delivery systems NASAL CANNULA      pH, Arterial 7.472 (*) 7.350 - 7.400    pCO2 arterial 26.9 (*) 35.0 - 45.0 mmHg    pO2, Arterial 88.1  80.0 - 100.0 mmHg    Bicarbonate 19.4 (*) 20.0 - 24.0 mEq/L    TCO2 16.7  0 - 100 mmol/L    Acid-base deficit 3.6 (*) 0.0 - 2.0 mmol/L    O2 Saturation 96.9      Patient temperature 37.0      Collection site LEFT RADIAL      Drawn by [id number]      Sample type ARTERIAL      Allens test (pass/fail) PASS  PASS   URINE RAPID DRUG SCREEN (HOSP PERFORMED)  Status: Normal   Collection Time   04/01/12  5:10 AM      Component Value Range Comment   Opiates NONE DETECTED  NONE DETECTED    Cocaine NONE DETECTED  NONE DETECTED    Benzodiazepines NONE DETECTED  NONE DETECTED    Amphetamines NONE DETECTED  NONE  DETECTED    Tetrahydrocannabinol NONE DETECTED  NONE DETECTED    Barbiturates NONE DETECTED  NONE DETECTED   URINALYSIS, ROUTINE W REFLEX MICROSCOPIC     Status: Abnormal   Collection Time   04/01/12  5:10 AM      Component Value Range Comment   Color, Urine YELLOW  YELLOW    APPearance CLEAR  CLEAR    Specific Gravity, Urine 1.020  1.005 - 1.030    pH 5.5  5.0 - 8.0    Glucose, UA NEGATIVE  NEGATIVE mg/dL    Hgb urine dipstick NEGATIVE  NEGATIVE    Bilirubin Urine NEGATIVE  NEGATIVE    Ketones, ur NEGATIVE  NEGATIVE mg/dL    Protein, ur TRACE (*) NEGATIVE mg/dL    Urobilinogen, UA 0.2  0.0 - 1.0 mg/dL    Nitrite NEGATIVE  NEGATIVE    Leukocytes, UA NEGATIVE  NEGATIVE     Radiological Exams on Admission: Ct Head Wo Contrast  04/01/2012  *RADIOLOGY REPORT*  Clinical Data: Altered mental status.  Fever  CT HEAD WITHOUT CONTRAST  Technique:  Contiguous axial images were obtained from the base of the skull through the vertex without contrast.  Comparison: MRI 12/08/2011  Findings: Generalized atrophy.  Chronic infarct left occipital lobe is unchanged.  Chronic ischemic changes in the white matter present, left greater than right.  Negative for acute infarct. Negative for hemorrhage or mass lesion.  Ventricle size is normal. Calvarium is intact.  IMPRESSION: Atrophy and chronic ischemic change.  No acute abnormality.  Original Report Authenticated By: Camelia Phenes, M.D.   Dg Chest Port 1 View  04/01/2012  *RADIOLOGY REPORT*  Clinical Data: Shortness of breath, vomiting, diarrhea.  PORTABLE CHEST - 1 VIEW  Comparison: 02/08/2012  Findings: Shallow inspiration.  Borderline heart size with normal pulmonary vascularity.  Calcification and torsion of the aorta.  No focal airspace consolidation in the lungs.  No blunting of costophrenic angles.  No apparent pneumothorax.  IMPRESSION: Shallow inspiration.  No evidence of active pulmonary disease.  Original Report Authenticated By: Marlon Pel,  M.D.    Assessment/Plan Principal Problem:  *Sepsis Active Problems:  Sheehan syndrome  Diabetes mellitus  Acute renal failure  Encephalopathy  Vomiting  Hypokalemia  Plan:  The source of her sepsis is not entirely clear. Her x-ray does not reveal any evidence of pneumonia, her urinalysis is not indicative of an infection. She does not have any meningeal signs, and her mental status has improved since admission with hydration. She is having vomiting and diarrhea. We will check a CT abdomen and pelvis to rule out any intra-abdominal pathology, we will check stool for C. difficile since she has recently been in the hospital. The patient will be started on broad-spectrum antibiotics of vancomycin and Zosyn and received vigorous IV hydration. Blood cultures already been sent. We will obtain a PICC line for further IV fluids, medications, CVP monitoring. With her history of Sheehan syndrome, she will receive stress dose steroids. Potassium will be repleted. We'll check a set of cardiac markers. We will hold her antihypertensives She'll be kept n.p.o. until her mental status improves. Her diabetes will be controlled with  sliding scale insulin. Patient will be admitted to the step down unit for further monitoring. Code Status discussed with husband and he has requested full code.  Time Spent on Admission: 60 mins  Annslee Tercero [facility] Pager: [phone] 04/01/2012, 8:46 AM

## 2012-04-01 NOTE — ED Notes (Signed)
PT to department via EMS.  Per report, pt has altered LOC and chest pain.  Pt unable to provide detailed information at this time.  EMS reports that pt's husband stated pt had several episodes of vomiting.  Family not available at present time.

## 2012-04-01 NOTE — Progress Notes (Signed)
*  PRELIMINARY RESULTS* Echocardiogram 2D Echocardiogram has been performed.  Katie Pace 04/01/2012, 1:10 PM

## 2012-04-01 NOTE — ED Provider Notes (Signed)
History     CSN: [id number]  Arrival date & time 04/01/12  0434   First MD Initiated Contact with Patient 04/01/12 [phone]      Chief Complaint  Patient presents with  . Altered Mental Status     Patient is a 76 y.o. female presenting with altered mental status. The history is provided by the EMS personnel. The history is limited by the condition of the patient.  Altered Mental Status This is a new problem. The current episode started yesterday. The problem occurs constantly. The problem has been rapidly worsening. Associated symptoms include chest pain. Nothing aggravates the symptoms. Nothing relieves the symptoms. She has tried nothing for the symptoms.  Pt presents from home for altered mental status and reported episodes of chest pain Per EMS, pt was last at baseline around 4pm She then became confused and altered.  There is a concern for stroke Also, within the past hour she had reported CP to her family No other details are known at arrival  Past Medical History  Diagnosis Date  . Hypertension   . Hypothyroidism   . Stroke     Ischemic x 2       last one 2007  . Diabetes mellitus     type 11  . Endocrine disturbance     gland has not  worked until 1970.Marland Kitchen..dx in 1977  . Blood transfusion     back in 1973  . GERD (gastroesophageal reflux disease)     on meds to help....meds do well   . Sheehan syndrome 1977  . AAA (abdominal aortic aneurysm)     S/p aortic repair and endarterectomy 12/2011   . Carotid stenosis, left     S/p left CEA 09/2008  . PVD (peripheral vascular disease)     Past Surgical History  Procedure Date  . Spine surgery     Lumbar Diskectomy  . Joint replacement May 2009    Right knee  . Joint replacement April 2008    Right Hip prothesis  . Carotid endarterectomy Dec. 2009  . Cardiac catheterization     1980  &  2009  . Appendectomy     1960  . Cesarean section   . Back surgery     1990's  . Abdominal hysterectomy 1973    1973  .  Fracture surgery     left ankle   . Abdominal aortic aneurysm repair 12/09/2011    Procedure: ANEURYSM ABDOMINAL AORTIC REPAIR;  Surgeon: Pryor Ochoa, MD;  Location: [facility] OR;  Service: Vascular;  Laterality: N/A;  resection and grafting abdominal aortic aneurysm; Insertion Bilateral External Iliac Graft; Reexploration Proximal Anastemosis; Aortic Endarterectomy; Reimplantation Inferior Mesenteric Artery; Intraaortic Graft  . Abdominal aortic aneurysm repair     Family History  Problem Relation Age of Onset  . Heart disease Mother   . Peripheral vascular disease Mother   . Alcohol abuse Father   . Heart disease Sister   . Heart disease Brother     History  Substance Use Topics  . Smoking status: Former Smoker -- 4 years    Types: Cigarettes    Quit date: 10/06/1963  . Smokeless tobacco: Never Used  . Alcohol Use: No    OB History    Grav Para Term Preterm Abortions TAB SAB Ect Mult Living                  Review of Systems  Unable to perform ROS: Mental status change  Cardiovascular: Positive  for chest pain.  Psychiatric/Behavioral: Positive for altered mental status.    Allergies  Codeine; Dilaudid; Lincomycin hcl; Macrodantin; Zolpidem tartrate; Azithromycin; and Penicillins  Home Medications   Current Outpatient Rx  Name Route Sig Dispense Refill  . ACETAMINOPHEN 325 MG PO TABS Oral Take 2 tablets (650 mg total) by mouth every 6 (six) hours as needed for pain. 40 tablet 0  . ALENDRONATE SODIUM 70 MG PO TABS Oral Take 70 mg by mouth every 7 (seven) days. Take with a full glass of water on an empty stomach.    . AMITRIPTYLINE HCL 25 MG PO TABS Oral Take 25 mg by mouth at bedtime.    . ASPIRIN 81 MG PO TABS Oral Take 81 mg by mouth daily.    . ATENOLOL 50 MG PO TABS Oral Take 50 mg by mouth daily.    Marland Kitchen VITAMIN D 1000 UNITS PO TABS Oral Take 3,000 Units by mouth daily.    Marland Kitchen CLOTRIMAZOLE-BETAMETHASONE 1-0.05 % EX CREA Topical Apply topically 2 (two) times daily.    .  CYANOCOBALAMIN 1000 MCG/15ML PO LIQD Oral Take by mouth.    Marland Kitchen DILTIAZEM HCL ER COATED BEADS 240 MG PO CP24 Oral Take 240 mg by mouth daily.    Marland Kitchen DILTIAZEM HCL ER BEADS 240 MG PO CP24 Oral Take 240 mg by mouth daily.    . ASPIRIN-DIPYRIDAMOLE ER 25-200 MG PO CP12 Oral Take 1 capsule by mouth 2 (two) times daily.    Marland Kitchen ESOMEPRAZOLE MAGNESIUM 40 MG PO CPDR Oral Take 40 mg by mouth daily before breakfast.    . FREESTYLE SYSTEM KIT Does not apply 1 each by Does not apply route as needed.    Marland Kitchen HYDROCORTISONE 20 MG PO TABS Oral Take 10-20 mg by mouth daily. 20 mg in the morning and 10 mg in the evening    . LEVOTHYROXINE SODIUM 75 MCG PO TABS Oral Take 75 mcg by mouth daily.    Marland Kitchen LISINOPRIL 10 MG PO TABS Oral Take 1 tablet (10 mg total) by mouth daily. 30 tablet 0  . PIOGLITAZONE HCL 45 MG PO TABS Oral Take 45 mg by mouth daily.    Marland Kitchen POTASSIUM CITRATE ER 10 MEQ (1080 MG) PO TBCR Oral Take 10 mEq by mouth 2 (two) times daily.    Marland Kitchen ROSUVASTATIN CALCIUM 5 MG PO TABS Oral Take 5 mg by mouth daily.    . SOMATROPIN 5 MG IJ SOLR Subcutaneous Inject 0.3 mg into the skin every evening.    Marland Kitchen TRAMADOL HCL 50 MG PO TABS Oral Take 50 mg by mouth every 6 (six) hours as needed.      BP 108/51  Pulse 124  Temp 104.6 F (40.3 C) (Rectal)  Resp 37  Ht 5\' 4"  (1.626 m)  Wt 146 lb (66.225 kg)  BMI 25.06 kg/m2  SpO2 96% Physical Exam CONSTITUTIONAL: awake/alert, but is ill appearing HEAD AND FACE: Normocephalic/atraumatic EYES: EOMI/PERRL ENMT: Mucous membranes dry NECK: supple no meningeal signs SPINE:entire spine nontender CV: tachycardia no murmurs/rubs/gallops noted LUNGS: Lungs are clear to auscultation bilaterally, no apparent distress ABDOMEN: soft, nontender, no rebound or guarding GU:no cva tenderness NEURO: Pt is awake but does not speak.  She does not follow commands.  She does not move any extremities on command.  She does not resist gravity in any of her extremities EXTREMITIES: pulses  normal/equalX4, full ROM SKIN: warm, color normal PSYCH: confused   ED Course  Procedures   CRITICAL CARE Performed by: Joya Gaskins  Total critical care time: 50  Critical care time was exclusive of separately billable procedures and treating other patients.  Critical care was necessary to treat or prevent imminent or life-threatening deterioration.  Critical care was time spent personally by me on the following activities: development of treatment plan with patient and/or surrogate as well as nursing, discussions with consultants, evaluation of patient's response to treatment, examination of patient, obtaining history from patient or surrogate, ordering and performing treatments and interventions, ordering and review of laboratory studies, ordering and review of radiographic studies, pulse oximetry and re-evaluation of patient's condition.     Labs Reviewed  GLUCOSE, CAPILLARY - Abnormal; Notable for the following:    Glucose-Capillary 101 (*)     All other components within normal limits  PROTIME-INR  CBC  DIFFERENTIAL  COMPREHENSIVE METABOLIC PANEL  TROPONIN I  URINE RAPID DRUG SCREEN (HOSP PERFORMED)  ETHANOL  TYPE AND SCREEN  URINALYSIS, ROUTINE W REFLEX MICROSCOPIC  CULTURE, BLOOD (ROUTINE X 2)  CULTURE, BLOOD (ROUTINE X 2)  LACTIC ACID, PLASMA  BLOOD GAS, ARTERIAL  LIPASE, BLOOD   5:04 AM Pt from home with AMS, reported h/o chest pain She is febrile to 104, cooling measures instituted At this point, I do not feel this is an acute CVA, infectious etiology more likely Pt with known h/o depressed immune status so will likely need test dose of steroids and antibiotics 5:43 AM Her mental status has improved, more awake, talking to family D/w husband, he reports over past 24 hours she has been tired, felt warm, and started to vomit Will follow closely 7:15 AM Her mental status is improving, she will speak, she will follow commands and she will recognize  family Her vitals have improved 7:38 AM D/w medicine will admit to stepdown Suspicion for meningitis low given now alert, neck is supple Abdomen soft, doubt acute abdomen Immunocompromised due to panhypopit, already given antibiotics/fluids/hydrocortisone    MDM  Nursing notes including past medical history and social history reviewed and considered in documentation All labs/vitals reviewed and considered Previous records reviewed and considered - h/o hypopituatarism xrays reviewed and considered        Date: 04/01/2012  Rate: 131  Rhythm: sinus tachycardia  QRS Axis: normal  Intervals: normal  ST/T Wave abnormalities: nonspecific ST changes  Conduction Disutrbances:none  Narrative Interpretation:   Old EKG Reviewed: changes noted    Joya Gaskins, MD 04/01/12 [phone]

## 2012-04-01 NOTE — ED Notes (Signed)
Family at bedside. 

## 2012-04-01 NOTE — Progress Notes (Signed)
UR Chart Review Completed  

## 2012-04-01 NOTE — ED Notes (Signed)
Pt sleeping but arouses to verbal stimuli. Speaking in clear sentences at this time.

## 2012-04-02 DIAGNOSIS — N179 Acute kidney failure, unspecified: Secondary | ICD-10-CM

## 2012-04-02 DIAGNOSIS — A413 Sepsis due to Hemophilus influenzae: Secondary | ICD-10-CM

## 2012-04-02 DIAGNOSIS — E230 Hypopituitarism: Secondary | ICD-10-CM

## 2012-04-02 DIAGNOSIS — A0472 Enterocolitis due to Clostridium difficile, not specified as recurrent: Secondary | ICD-10-CM | POA: Diagnosis present

## 2012-04-02 DIAGNOSIS — R197 Diarrhea, unspecified: Secondary | ICD-10-CM

## 2012-04-02 LAB — CBC
Hemoglobin: 10.5 g/dL — ABNORMAL LOW (ref 12.0–15.0)
MCV: 93.8 fL (ref 78.0–100.0)
Platelets: 132 10*3/uL — ABNORMAL LOW (ref 150–400)
RBC: 3.41 MIL/uL — ABNORMAL LOW (ref 3.87–5.11)
WBC: 8.4 10*3/uL (ref 4.0–10.5)

## 2012-04-02 LAB — BASIC METABOLIC PANEL
CO2: 17 mEq/L — ABNORMAL LOW (ref 19–32)
Calcium: 7.5 mg/dL — ABNORMAL LOW (ref 8.4–10.5)
GFR calc non Af Amer: 28 mL/min — ABNORMAL LOW (ref >90)
Glucose, Bld: 111 mg/dL — ABNORMAL HIGH (ref 70–99)
Potassium: 3.2 mEq/L — ABNORMAL LOW (ref 3.5–5.1)
Sodium: 140 mEq/L (ref 135–145)

## 2012-04-02 LAB — LACTIC ACID, PLASMA: Lactic Acid, Venous: 0.8 mmol/L (ref 0.5–2.2)

## 2012-04-02 LAB — GLUCOSE, CAPILLARY
Glucose-Capillary: 106 mg/dL — ABNORMAL HIGH (ref 70–99)
Glucose-Capillary: 122 mg/dL — ABNORMAL HIGH (ref 70–99)

## 2012-04-02 LAB — CARDIAC PANEL(CRET KIN+CKTOT+MB+TROPI): Troponin I: 0.44 ng/mL (ref <0.30)

## 2012-04-02 MED ORDER — TRAMADOL HCL 50 MG PO TABS
100.0000 mg | ORAL_TABLET | Freq: Every day | ORAL | Status: DC
  Administered 2012-04-02 – 2012-04-03 (×2): 100 mg via ORAL
  Filled 2012-04-02 (×2): qty 2

## 2012-04-02 MED ORDER — ATENOLOL 25 MG PO TABS
50.0000 mg | ORAL_TABLET | Freq: Every day | ORAL | Status: DC
  Administered 2012-04-02 – 2012-04-04 (×3): 50 mg via ORAL
  Filled 2012-04-02 (×3): qty 2

## 2012-04-02 MED ORDER — LEVOTHYROXINE SODIUM 75 MCG PO TABS
75.0000 ug | ORAL_TABLET | Freq: Every day | ORAL | Status: DC
  Administered 2012-04-02 – 2012-04-04 (×3): 75 ug via ORAL
  Filled 2012-04-02 (×3): qty 1

## 2012-04-02 MED ORDER — INSULIN ASPART 100 UNIT/ML ~~LOC~~ SOLN: 0.0000 [IU] | Freq: Every day | SUBCUTANEOUS | Status: DC

## 2012-04-02 MED ORDER — POTASSIUM CHLORIDE CRYS ER 20 MEQ PO TBCR
40.0000 meq | EXTENDED_RELEASE_TABLET | Freq: Once | ORAL | Status: AC
  Administered 2012-04-02: 40 meq via ORAL
  Filled 2012-04-02: qty 2

## 2012-04-02 MED ORDER — AMITRIPTYLINE HCL 25 MG PO TABS
25.0000 mg | ORAL_TABLET | Freq: Every day | ORAL | Status: DC
  Administered 2012-04-02 – 2012-04-03 (×2): 25 mg via ORAL
  Filled 2012-04-02 (×2): qty 1

## 2012-04-02 MED ORDER — ATORVASTATIN CALCIUM 10 MG PO TABS
10.0000 mg | ORAL_TABLET | Freq: Every day | ORAL | Status: DC
  Administered 2012-04-02 – 2012-04-04 (×3): 10 mg via ORAL
  Filled 2012-04-02 (×3): qty 1

## 2012-04-02 MED ORDER — INSULIN ASPART 100 UNIT/ML ~~LOC~~ SOLN
0.0000 [IU] | Freq: Three times a day (TID) | SUBCUTANEOUS | Status: DC
  Administered 2012-04-02: 2 [IU] via SUBCUTANEOUS

## 2012-04-02 MED ORDER — ASPIRIN-DIPYRIDAMOLE ER 25-200 MG PO CP12
1.0000 | ORAL_CAPSULE | Freq: Two times a day (BID) | ORAL | Status: DC
  Administered 2012-04-02 – 2012-04-04 (×5): 1 via ORAL
  Filled 2012-04-02 (×9): qty 1

## 2012-04-02 MED ORDER — SACCHAROMYCES BOULARDII 250 MG PO CAPS
250.0000 mg | ORAL_CAPSULE | Freq: Two times a day (BID) | ORAL | Status: DC
  Administered 2012-04-02 – 2012-04-04 (×5): 250 mg via ORAL
  Filled 2012-04-02 (×5): qty 1

## 2012-04-02 MED ORDER — SODIUM CHLORIDE 0.9 % IJ SOLN
INTRAMUSCULAR | Status: AC
  Filled 2012-04-02: qty 6

## 2012-04-02 MED ORDER — HYDROCORTISONE SOD SUCCINATE 100 MG IJ SOLR
50.0000 mg | Freq: Two times a day (BID) | INTRAMUSCULAR | Status: DC
  Administered 2012-04-02 – 2012-04-03 (×2): 50 mg via INTRAVENOUS
  Filled 2012-04-02 (×2): qty 2

## 2012-04-02 NOTE — Progress Notes (Signed)
Subjective: Patient appears to be markedly improved today.  She is sitting up in bed, awake, alert.  She describes some nausea, but no vomiting. She had a rectal tube placed yesterday for persistent diarrhea.  Objective: Vital signs in last 24 hours: Temp:  [97.6 F (36.4 C)-98.8 F (37.1 C)] 98.8 F (37.1 C) (06/29 0741) Pulse Rate:  [72-90] 90  (06/29 0800) Resp:  [13-24] 20  (06/29 0800) BP: (100-177)/(41-82) 171/67 mmHg (06/29 0800) SpO2:  [94 %-100 %] 97 % (06/29 0800) Weight:  [68 kg (149 lb 14.6 oz)] 68 kg (149 lb 14.6 oz) (06/29 0500) Weight change: -0.425 kg (-15 oz) Last BM Date: 04/02/12  Intake/Output from previous day: 06/28 0701 - 06/29 0700 In: 6682 [P.O.:480; I.V.:2750; IV Piggyback:452] Out: 2200 [Urine:400; Stool:1800] Total I/O In: 585 [P.O.:360; I.V.:125; IV Piggyback:100] Out: -    Physical Exam: General: Alert, awake, oriented x3, in no acute distress. HEENT: No bruits, no goiter. Heart: Regular rate and rhythm, without murmurs, rubs, gallops. Lungs: Clear to auscultation bilaterally. Abdomen: Soft, nontender, nondistended, positive bowel sounds. Extremities: No clubbing cyanosis or edema with positive pedal pulses. Neuro: Grossly intact, nonfocal.    Lab Results: Basic Metabolic Panel:  Basename 04/02/12 0459 04/01/12 0447  NA 140 135  K 3.2* 3.1*  CL 106 94*  CO2 17* 18*  GLUCOSE 111* 97  BUN 40* 39*  CREATININE 1.74* 2.84*  CALCIUM 7.5* 9.3  MG -- --  PHOS -- --   Liver Function Tests:  [facility] 04/01/12 0447  AST 24  ALT 8  ALKPHOS 62  BILITOT 0.7  PROT 7.4  ALBUMIN 3.6    Basename 04/01/12 0447  LIPASE 6*  AMYLASE --   No results found for this basename: AMMONIA:2 in the last 72 hours CBC:  Basename 04/02/12 0459 04/01/12 0447  WBC 8.4 12.0*  NEUTROABS -- 9.7*  HGB 10.5* 13.8  HCT 32.0* 42.0  MCV 93.8 93.5  PLT 132* 186   Cardiac Enzymes:  Basename 04/02/12 0055 04/01/12 1753 04/01/12 0911  CKTOTAL 324* 332*  273*  CKMB 2.9 3.4 4.7*  CKMBINDEX -- -- --  TROPONINI 0.44* 0.41* 0.32*   BNP: No results found for this basename: PROBNP:3 in the last 72 hours D-Dimer: No results found for this basename: DDIMER:2 in the last 72 hours CBG:  Basename 04/02/12 0724 04/02/12 0413 04/02/12 0002 04/01/12 1955 04/01/12 1635 04/01/12 1233  GLUCAP 122* 106* 121* 115* 123* 150*   Hemoglobin A1C:  Basename 04/01/12 0911  HGBA1C 5.7*   Fasting Lipid Panel: No results found for this basename: CHOL,HDL,LDLCALC,TRIG,CHOLHDL,LDLDIRECT in the last 72 hours Thyroid Function Tests: No results found for this basename: TSH,T4TOTAL,FREET4,T3FREE,THYROIDAB in the last 72 hours Anemia Panel: No results found for this basename: VITAMINB12,FOLATE,FERRITIN,TIBC,IRON,RETICCTPCT in the last 72 hours Coagulation:  Basename 04/01/12 0447  LABPROT 16.7*  INR 1.33   Urine Drug Screen: Drugs of Abuse     Component Value Date/Time   LABOPIA NONE DETECTED 04/01/2012 0510   COCAINSCRNUR NONE DETECTED 04/01/2012 0510   LABBENZ NONE DETECTED 04/01/2012 0510   AMPHETMU NONE DETECTED 04/01/2012 0510   THCU NONE DETECTED 04/01/2012 0510   LABBARB NONE DETECTED 04/01/2012 0510    Alcohol Level:  Basename 04/01/12 0447  ETH <11   Urinalysis:  Basename 04/01/12 0510  COLORURINE YELLOW  LABSPEC 1.020  PHURINE 5.5  GLUCOSEU NEGATIVE  HGBUR NEGATIVE  BILIRUBINUR NEGATIVE  KETONESUR NEGATIVE  PROTEINUR TRACE*  UROBILINOGEN 0.2  NITRITE NEGATIVE  LEUKOCYTESUR NEGATIVE  Recent Results (from the past 240 hour(s))  CULTURE, BLOOD (ROUTINE X 2)     Status: Normal (Preliminary result)   Collection Time   04/01/12  4:46 AM      Component Value Range Status Comment   Specimen Description Blood LEFT ARM   Final    Special Requests NONE 6CC   Final    Culture NO GROWTH 1 DAY   Final    Report Status PENDING   Incomplete   CULTURE, BLOOD (ROUTINE X 2)     Status: Normal (Preliminary result)   Collection Time   04/01/12   4:49 AM      Component Value Range Status Comment   Specimen Description Blood RIGHT ARM   Final    Special Requests NONE 6CC   Final    Culture NO GROWTH 1 DAY   Final    Report Status PENDING   Incomplete   MRSA PCR SCREENING     Status: Normal   Collection Time   04/01/12 10:18 AM      Component Value Range Status Comment   MRSA by PCR NEGATIVE  NEGATIVE Final   CLOSTRIDIUM DIFFICILE BY PCR     Status: Abnormal   Collection Time   04/01/12 10:28 AM      Component Value Range Status Comment   C difficile by pcr POSITIVE (*) NEGATIVE Final     Studies/Results: Ct Abdomen Pelvis Wo Contrast  04/01/2012  *RADIOLOGY REPORT*  Clinical Data: vomiting and diarrhea  CT ABDOMEN AND PELVIS WITHOUT CONTRAST  Technique:  Multidetector CT imaging of the abdomen and pelvis was performed following the standard protocol without intravenous contrast.  Comparison: CT 11/12/2009.  Findings: There is atelectasis at the lung bases  slightly increased compared to prior.  No pericardial fluid.  Heart is enlarged.  Non-IV contrast images demonstrate no focal hepatic lesion.  The gallbladder is absent.  The pancreas, spleen, adrenal glands, and kidneys are normal.  The stomach, small bowel and colon appear normal.  There is fluids stool throughout the colon.  There is interval graft repair of abdominal aortic aneurysm.  Mild inflammatory stranding surrounding the repair.  In the pelvis, there is no free fluid.  There is a Foley catheter in the bladder.  No pelvic lymphadenopathy. Review of  bone windows demonstrates no aggressive osseous lesions.  IMPRESSION:  1.  Fluid stool throughout the colon. 2.  No evidence of bowel obstruction. 3.  Repair of aortic aneurysm. 4.  Increased  basilar atelectasis.  Original Report Authenticated By: Genevive Bi, M.D.   Ct Head Wo Contrast  04/01/2012  *RADIOLOGY REPORT*  Clinical Data: Altered mental status.  Fever  CT HEAD WITHOUT CONTRAST  Technique:  Contiguous axial images  were obtained from the base of the skull through the vertex without contrast.  Comparison: MRI 12/08/2011  Findings: Generalized atrophy.  Chronic infarct left occipital lobe is unchanged.  Chronic ischemic changes in the white matter present, left greater than right.  Negative for acute infarct. Negative for hemorrhage or mass lesion.  Ventricle size is normal. Calvarium is intact.  IMPRESSION: Atrophy and chronic ischemic change.  No acute abnormality.  Original Report Authenticated By: Camelia Phenes, M.D.   Dg Chest Port 1 View  04/01/2012  *RADIOLOGY REPORT*  Clinical Data: Right arm PICC line placement.  PORTABLE CHEST - 1 VIEW  Comparison: 04/01/2012  Findings: A new right arm PICC line is seen with tip overlying the expected location of the cavoatrial junction.  Mild scarring again noted at the left lung base.  Lung fields otherwise clear.  Mild cardiomegaly and ectasia of the thoracic aorta are also stable.  IMPRESSION:  1.  New right arm PICC line in appropriate position. 2.  Stable cardiomegaly.  No active lung disease.  Original Report Authenticated By: Danae Orleans, M.D.   Dg Chest Port 1 View  04/01/2012  *RADIOLOGY REPORT*  Clinical Data: Shortness of breath, vomiting, diarrhea.  PORTABLE CHEST - 1 VIEW  Comparison: 02/08/2012  Findings: Shallow inspiration.  Borderline heart size with normal pulmonary vascularity.  Calcification and torsion of the aorta.  No focal airspace consolidation in the lungs.  No blunting of costophrenic angles.  No apparent pneumothorax.  IMPRESSION: Shallow inspiration.  No evidence of active pulmonary disease.  Original Report Authenticated By: Marlon Pel, M.D.    Medications: Scheduled Meds:   . enoxaparin  30 mg Subcutaneous Q24H  . hydrocortisone sod succinate (SOLU-CORTEF) injection  100 mg Intravenous Q8H  . insulin aspart  0-15 Units Subcutaneous Q4H  . vancomycin  500 mg Oral Q6H   And  . metronidazole  500 mg Intravenous Q8H  .  piperacillin-tazobactam (ZOSYN)  IV  3.375 g Intravenous Once  . sodium chloride  10-40 mL Intracatheter Q12H  . sodium chloride      . Somatropin  0.3 mg Subcutaneous QPM  . DISCONTD: hydrocortisone sod succinate (SOLU-CORTEF) injection  100 mg Intravenous Q8H  . DISCONTD: piperacillin-tazobactam  3.375 g Intravenous Q8H  . DISCONTD: piperacillin-tazobactam  3.375 g Intravenous Q6H  . DISCONTD: vancomycin  1,000 mg Intravenous Q48H   Continuous Infusions:   . 0.9 % NaCl with KCl 40 mEq / L 125 mL/hr at 04/02/12 0800   PRN Meds:.acetaminophen, acetaminophen, ondansetron (ZOFRAN) IV, ondansetron, sodium chloride, traMADol  Assessment/Plan:  Principal Problem:  *Sepsis Active Problems:  Sheehan syndrome  Diabetes mellitus  Acute renal failure  Encephalopathy  Vomiting  Hypokalemia  C. difficile diarrhea  Plan:  1. Sepsis due to Clostridium difficile.  Patient is clinically improving. She is on oral vancomycin and IV flagyl. Will discontinue IV flagyl.  Start probiotics.  2. Acute renal failure due to dehydration.  Improving with IV fluids  3. Hypokalemia, replete  4. Encephalopathy due to sepsis, improved.  5. Sheehan syndrome.  Restart synthroid, taper steroids  6. Diabetes.  Change sliding scale to AC and HS.  Change diet to full liquids  7. Elevated cardiac enzymes.  Likely related to demand ischemia.  Echo shows preserved EF and no wall motion abnormalities.  8. HTN.  Restart atenolol.  Continue to hold lisinopril due to renal dysfunction  9. Dispo.  Transfer to medical floor, Physical therapy to see.    LOS: 1 day   Laconda Basich [facility] Pager: [phone] 04/02/2012, 9:25 AM

## 2012-04-03 DIAGNOSIS — E230 Hypopituitarism: Secondary | ICD-10-CM

## 2012-04-03 DIAGNOSIS — N179 Acute kidney failure, unspecified: Secondary | ICD-10-CM

## 2012-04-03 DIAGNOSIS — A413 Sepsis due to Hemophilus influenzae: Secondary | ICD-10-CM

## 2012-04-03 DIAGNOSIS — R197 Diarrhea, unspecified: Secondary | ICD-10-CM

## 2012-04-03 LAB — CBC
MCH: 31.4 pg (ref 26.0–34.0)
MCHC: 33.1 g/dL (ref 30.0–36.0)
MCV: 94.7 fL (ref 78.0–100.0)
Platelets: 118 10*3/uL — ABNORMAL LOW (ref 150–400)
RDW: 16.8 % — ABNORMAL HIGH (ref 11.5–15.5)

## 2012-04-03 LAB — BASIC METABOLIC PANEL
CO2: 18 mEq/L — ABNORMAL LOW (ref 19–32)
Calcium: 7.1 mg/dL — ABNORMAL LOW (ref 8.4–10.5)
Creatinine, Ser: 0.99 mg/dL (ref 0.50–1.10)
GFR calc non Af Amer: 55 mL/min — ABNORMAL LOW (ref >90)
Glucose, Bld: 85 mg/dL (ref 70–99)

## 2012-04-03 LAB — GLUCOSE, CAPILLARY
Glucose-Capillary: 76 mg/dL (ref 70–99)
Glucose-Capillary: 119 mg/dL — ABNORMAL HIGH (ref 70–99)
Glucose-Capillary: 107 mg/dL — ABNORMAL HIGH (ref 70–99)
Glucose-Capillary: 112 mg/dL — ABNORMAL HIGH (ref 70–99)

## 2012-04-03 LAB — URINE CULTURE
Colony Count: NO GROWTH
Culture: NO GROWTH

## 2012-04-03 LAB — MAGNESIUM: Magnesium: 1.7 mg/dL (ref 1.5–2.5)

## 2012-04-03 MED ORDER — ENOXAPARIN SODIUM 40 MG/0.4ML ~~LOC~~ SOLN
40.0000 mg | SUBCUTANEOUS | Status: DC
  Administered 2012-04-04: 40 mg via SUBCUTANEOUS
  Filled 2012-04-03 (×2): qty 0.4

## 2012-04-03 MED ORDER — HYDROCORTISONE 10 MG PO TABS
10.0000 mg | ORAL_TABLET | Freq: Every day | ORAL | Status: DC
  Administered 2012-04-03: 10 mg via ORAL
  Filled 2012-04-03 (×3): qty 1

## 2012-04-03 MED ORDER — HYDROCORTISONE 20 MG PO TABS
20.0000 mg | ORAL_TABLET | Freq: Every day | ORAL | Status: DC
  Administered 2012-04-03 – 2012-04-04 (×2): 20 mg via ORAL
  Filled 2012-04-03 (×4): qty 1

## 2012-04-03 MED ORDER — HYDROCORTISONE 10 MG PO TABS: 10.0000 mg | ORAL_TABLET | Freq: Every day | ORAL | Status: DC

## 2012-04-03 MED ORDER — SODIUM CHLORIDE 0.45 % IV SOLN
INTRAVENOUS | Status: DC
  Administered 2012-04-03 – 2012-04-04 (×2): via INTRAVENOUS

## 2012-04-03 MED ORDER — DILTIAZEM HCL ER COATED BEADS 240 MG PO CP24
240.0000 mg | ORAL_CAPSULE | Freq: Every day | ORAL | Status: DC
  Administered 2012-04-03 – 2012-04-04 (×2): 240 mg via ORAL
  Filled 2012-04-03 (×2): qty 1

## 2012-04-03 MED ORDER — LISINOPRIL 10 MG PO TABS
10.0000 mg | ORAL_TABLET | Freq: Every day | ORAL | Status: DC
  Administered 2012-04-03 – 2012-04-04 (×2): 10 mg via ORAL
  Filled 2012-04-03 (×2): qty 1

## 2012-04-03 MED ORDER — VITAMIN D 1000 UNITS PO TABS
3000.0000 [IU] | ORAL_TABLET | Freq: Every day | ORAL | Status: DC
  Administered 2012-04-03 – 2012-04-04 (×2): 3000 [IU] via ORAL
  Filled 2012-04-03 (×2): qty 3

## 2012-04-03 NOTE — Progress Notes (Signed)
Subjective: Nausea and vomiting have resolved, rectal tube in place with liquid stool in it. Patient is feeling better. Has chronic back pain.  Eager to get up and move around.  Objective: Vital signs in last 24 hours: Temp:  [97.5 F (36.4 C)-98.6 F (37 C)] 97.5 F (36.4 C) (06/30 0623) Pulse Rate:  [73-78] 73  (06/30 0623) Resp:  [18-20] 18  (06/30 0623) BP: (154-197)/(74-75) 171/75 mmHg (06/30 0623) SpO2:  [94 %-97 %] 94 % (06/30 0623) Weight:  [69.128 kg (152 lb 6.4 oz)] 69.128 kg (152 lb 6.4 oz) (06/30 0355) Weight change: 3.328 kg (7 lb 5.4 oz) Last BM Date: 04/03/12  Intake/Output from previous day: 06/29 0701 - 06/30 0700 In: 2998 [P.O.:600; I.V.:2298; IV Piggyback:100] Out: 2050 [Urine:1750; Stool:300] Total I/O In: 664.6 [I.V.:664.6] Out: -    Physical Exam: General: Alert, awake, oriented x3, in no acute distress. HEENT: No bruits, no goiter. Heart: Regular rate and rhythm, without murmurs, rubs, gallops. Lungs: Clear to auscultation bilaterally. Abdomen: Soft, nontender, nondistended, positive bowel sounds. Extremities: No clubbing cyanosis or edema with positive pedal pulses. Neuro: Grossly intact, nonfocal.    Lab Results: Basic Metabolic Panel:  Basename 04/03/12 0634 04/02/12 0459  NA 140 140  K 4.2 3.2*  CL 112 106  CO2 18* 17*  GLUCOSE 85 111*  BUN 23 40*  CREATININE 0.99 1.74*  CALCIUM 7.1* 7.5*  MG 1.7 --  PHOS -- --   Liver Function Tests:  [facility] 04/01/12 0447  AST 24  ALT 8  ALKPHOS 62  BILITOT 0.7  PROT 7.4  ALBUMIN 3.6    Basename 04/01/12 0447  LIPASE 6*  AMYLASE --   No results found for this basename: AMMONIA:2 in the last 72 hours CBC:  Basename 04/03/12 0634 04/02/12 0459 04/01/12 0447  WBC 5.5 8.4 --  NEUTROABS -- -- 9.7*  HGB 10.1* 10.5* --  HCT 30.5* 32.0* --  MCV 94.7 93.8 --  PLT 118* 132* --   Cardiac Enzymes:  Basename 04/02/12 0055 04/01/12 1753 04/01/12 0911  CKTOTAL 324* 332* 273*  CKMB 2.9  3.4 4.7*  CKMBINDEX -- -- --  TROPONINI 0.44* 0.41* 0.32*   BNP: No results found for this basename: PROBNP:3 in the last 72 hours D-Dimer: No results found for this basename: DDIMER:2 in the last 72 hours CBG:  Basename 04/03/12 1122 04/03/12 0742 04/02/12 2131 04/02/12 1654 04/02/12 1156 04/02/12 0724  GLUCAP 119* 76 87 102* 131* 122*   Hemoglobin A1C:  Basename 04/01/12 0911  HGBA1C 5.7*   Fasting Lipid Panel: No results found for this basename: CHOL,HDL,LDLCALC,TRIG,CHOLHDL,LDLDIRECT in the last 72 hours Thyroid Function Tests: No results found for this basename: TSH,T4TOTAL,FREET4,T3FREE,THYROIDAB in the last 72 hours Anemia Panel: No results found for this basename: VITAMINB12,FOLATE,FERRITIN,TIBC,IRON,RETICCTPCT in the last 72 hours Coagulation:  Basename 04/01/12 0447  LABPROT 16.7*  INR 1.33   Urine Drug Screen: Drugs of Abuse     Component Value Date/Time   LABOPIA NONE DETECTED 04/01/2012 0510   COCAINSCRNUR NONE DETECTED 04/01/2012 0510   LABBENZ NONE DETECTED 04/01/2012 0510   AMPHETMU NONE DETECTED 04/01/2012 0510   THCU NONE DETECTED 04/01/2012 0510   LABBARB NONE DETECTED 04/01/2012 0510    Alcohol Level:  Basename 04/01/12 0447  ETH <11   Urinalysis:  Basename 04/01/12 0510  COLORURINE YELLOW  LABSPEC 1.020  PHURINE 5.5  GLUCOSEU NEGATIVE  HGBUR NEGATIVE  BILIRUBINUR NEGATIVE  KETONESUR NEGATIVE  PROTEINUR TRACE*  UROBILINOGEN 0.2  NITRITE NEGATIVE  LEUKOCYTESUR NEGATIVE  Recent Results (from the past 240 hour(s))  CULTURE, BLOOD (ROUTINE X 2)     Status: Normal (Preliminary result)   Collection Time   04/01/12  4:46 AM      Component Value Range Status Comment   Specimen Description Blood LEFT ARM   Final    Special Requests NONE 6CC   Final    Culture NO GROWTH 2 DAYS   Final    Report Status PENDING   Incomplete   CULTURE, BLOOD (ROUTINE X 2)     Status: Normal (Preliminary result)   Collection Time   04/01/12  4:49 AM       Component Value Range Status Comment   Specimen Description Blood RIGHT ARM   Final    Special Requests NONE 6CC   Final    Culture NO GROWTH 2 DAYS   Final    Report Status PENDING   Incomplete   URINE CULTURE     Status: Normal   Collection Time   04/01/12  6:11 AM      Component Value Range Status Comment   Specimen Description URINE, CLEAN CATCH   Final    Special Requests NONE   Final    Culture  Setup Time 04/02/2012 01:22   Final    Colony Count NO GROWTH   Final    Culture NO GROWTH   Final    Report Status 04/03/2012 FINAL   Final   MRSA PCR SCREENING     Status: Normal   Collection Time   04/01/12 10:18 AM      Component Value Range Status Comment   MRSA by PCR NEGATIVE  NEGATIVE Final   CLOSTRIDIUM DIFFICILE BY PCR     Status: Abnormal   Collection Time   04/01/12 10:28 AM      Component Value Range Status Comment   C difficile by pcr POSITIVE (*) NEGATIVE Final     Studies/Results: No results found.  Medications: Scheduled Meds:   . amitriptyline  25 mg Oral QHS  . atenolol  50 mg Oral Daily  . atorvastatin  10 mg Oral q1800  . cholecalciferol  3,000 Units Oral Daily  . diltiazem  240 mg Oral Daily  . dipyridamole-aspirin  1 capsule Oral BID  . enoxaparin  40 mg Subcutaneous Q24H  . hydrocortisone  10-20 mg Oral Daily  . insulin aspart  0-15 Units Subcutaneous TID WC  . insulin aspart  0-5 Units Subcutaneous QHS  . levothyroxine  75 mcg Oral QAC breakfast  . lisinopril  10 mg Oral Daily  . saccharomyces boulardii  250 mg Oral BID  . sodium chloride  10-40 mL Intracatheter Q12H  . sodium chloride      . Somatropin  0.3 mg Subcutaneous QPM  . traMADol  100 mg Oral QHS  . vancomycin  500 mg Oral Q6H  . DISCONTD: enoxaparin  30 mg Subcutaneous Q24H  . DISCONTD: hydrocortisone sod succinate (SOLU-CORTEF) injection  50 mg Intravenous Q12H   Continuous Infusions:   . sodium chloride    . DISCONTD: 0.9 % NaCl with KCl 40 mEq / L 125 mL/hr at 04/03/12 0649    PRN Meds:.acetaminophen, acetaminophen, ondansetron (ZOFRAN) IV, ondansetron, sodium chloride, traMADol  Assessment/Plan:  Principal Problem:  *Sepsis Active Problems:  Sheehan syndrome  Diabetes mellitus  Acute renal failure  Encephalopathy  Vomiting  Hypokalemia  C. difficile diarrhea  Plan:  1. Sepsis due to Clostridium difficile. Patient is clinically improving. She is on oral vancomycin.  Will discontinue rectal tube as diarrhea improves.   2. Acute renal failure due to dehydration. Resolved with IV fluids  3. Hypokalemia, improved  4. Encephalopathy due to sepsis, improved.   5. 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 Diabetes. Change sliding scale to AC and HS. Advance to low residue diet   7. Elevated cardiac enzymes. Likely related to demand ischemia. Echo shows preserved EF and no wall motion abnormalities.   8. HTN. Restart remainder of antihypertensives  9. Metabolic acidosis, likely due to diarrhea.  Continue low rate of IV fluids until diarrhea improves  10. Dispo. Physical therapy to see.    LOS: 2 days   Maitri Schnoebelen [facility] Pager: [phone] 04/03/2012, 12:59 PM

## 2012-04-03 NOTE — Progress Notes (Signed)
Pt ambulated half of the hall with nursing assistant. Pt sob with exertion noted. Assisted pt back to room and O2 sats 95% Room air. Will cont to monitor.

## 2012-04-04 ENCOUNTER — Encounter (HOSPITAL_COMMUNITY): Payer: Self-pay | Admitting: Internal Medicine

## 2012-04-04 DIAGNOSIS — N179 Acute kidney failure, unspecified: Secondary | ICD-10-CM

## 2012-04-04 DIAGNOSIS — A0472 Enterocolitis due to Clostridium difficile, not specified as recurrent: Secondary | ICD-10-CM

## 2012-04-04 DIAGNOSIS — G9340 Encephalopathy, unspecified: Secondary | ICD-10-CM

## 2012-04-04 DIAGNOSIS — E860 Dehydration: Secondary | ICD-10-CM

## 2012-04-04 LAB — GLUCOSE, CAPILLARY: Glucose-Capillary: 96 mg/dL (ref 70–99)

## 2012-04-04 LAB — CBC
MCH: 30.7 pg (ref 26.0–34.0)
MCHC: 32.4 g/dL (ref 30.0–36.0)
MCV: 94.9 fL (ref 78.0–100.0)
Platelets: 130 10*3/uL — ABNORMAL LOW (ref 150–400)
RBC: 3.35 MIL/uL — ABNORMAL LOW (ref 3.87–5.11)
RDW: 16.7 % — ABNORMAL HIGH (ref 11.5–15.5)

## 2012-04-04 LAB — BASIC METABOLIC PANEL
BUN: 17 mg/dL (ref 6–23)
CO2: 20 mEq/L (ref 19–32)
Calcium: 7.6 mg/dL — ABNORMAL LOW (ref 8.4–10.5)
Creatinine, Ser: 0.91 mg/dL (ref 0.50–1.10)
GFR calc non Af Amer: 61 mL/min — ABNORMAL LOW (ref >90)
Glucose, Bld: 96 mg/dL (ref 70–99)

## 2012-04-04 MED ORDER — VANCOMYCIN 50 MG/ML ORAL SOLUTION: 250.0000 mg | Freq: Four times a day (QID) | ORAL | Status: DC

## 2012-04-04 MED ORDER — LISINOPRIL 10 MG PO TABS: 10.0000 mg | ORAL_TABLET | Freq: Every day | ORAL | Status: AC

## 2012-04-04 MED ORDER — SACCHAROMYCES BOULARDII 250 MG PO CAPS: 250.0000 mg | ORAL_CAPSULE | Freq: Two times a day (BID) | ORAL | Status: AC

## 2012-04-04 NOTE — Progress Notes (Signed)
Pt and husband provided with discharge instructions and 2 prescriptions.  Pt and husband verbalized understanding of d/c orders.

## 2012-04-04 NOTE — Care Management Note (Signed)
    Page 1 of 1   04/04/2012     3:46:41 PM   CARE MANAGEMENT NOTE 04/04/2012  Patient:  Katie Pace, Katie Pace   Account Number:  [id number]  Date Initiated:  04/04/2012  Documentation initiated by:  Rosemary Holms  Subjective/Objective Assessment:   Pt admitted from home where she lives which her spouse. Pt tested positive for C Diff.     Action/Plan:   Spoke to pt at bedside. Wishes to be DC'd today and have "all these tubes removed". Verfied pt will dc home on PO antibiotics but will need HH PT. Used [facility] in the past and would like to use them again.   Anticipated DC Date:  04/04/2012   Anticipated DC Plan:  HOME W HOME HEALTH SERVICES      DC Planning Services  CM consult      Choice offered to / List presented to:  C-1 Patient        HH arranged  HH-2 PT      [facility] agency  [facility]   Status of service:  Completed, signed off Medicare Important Message given?   (If response is "NO", the following Medicare IM given date fields will be blank) Date Medicare IM given:   Date Additional Medicare IM given:    Discharge Disposition:  HOME W HOME HEALTH SERVICES  Per UR Regulation:    If discussed at Long Length of Stay Meetings, dates discussed:    Comments:  04/04/12 1535 Opaline Reyburn Leanord Hawking RN BSN CM [facility] notified of DC and [facility] PT order.

## 2012-04-04 NOTE — Evaluation (Signed)
Physical Therapy Evaluation Patient Details Name: Katie Pace MRN: [id number] DOB: 01-05-1936 Today's Date: 04/04/2012 Time: 4098-1191 PT Time Calculation (min): 25 min  PT Assessment / Plan / Recommendation Clinical Impression  Ms. Kershaw is referred to PT for weakness while at [facility].  She is currently at supervision level for mobility and requires assistance only for lines and leads.  Recommend HHPT for further care.     PT Assessment  Patient needs continued PT services    Follow Up Recommendations  Home health PT    Barriers to Discharge        Equipment Recommendations  None recommended by PT    Recommendations for Other Services     Frequency Min 3X/week    Precautions / Restrictions Precautions Precautions: Other (comment) (CONTACT)   Pertinent Vitals/Pain No/denies      Mobility  Bed Mobility Bed Mobility: Sit to Supine Sit to Supine: 6: Modified independent (Device/Increase time) Details for Bed Mobility Assistance: assistance for lines and leads only Transfers Transfers: Sit to Stand;Stand to Sit Sit to Stand: 5: Supervision Stand to Sit: 5: Supervision Ambulation/Gait Ambulation/Gait Assistance: 6: Modified independent (Device/Increase time) Ambulation Distance (Feet): 15 Feet (in room) Assistive device: Other (Comment) (long handled walking sticks) Ambulation/Gait Assistance Details: uses her home made walking sticks for about 1 year.  able to manuever with them.  Requires assistance for lines and leads only.  Gait Pattern: Within Functional Limits    Exercises     PT Diagnosis: Difficulty walking  PT Problem List: Decreased strength;Decreased activity tolerance PT Treatment Interventions: Gait training;Therapeutic activities;Therapeutic exercise;Balance training   PT Goals Acute Rehab PT Goals PT Goal Formulation: With patient Time For Goal Achievement: 04/08/12 Potential to Achieve Goals: Good Pt will Ambulate: >150 feet;with modified  independence PT Goal: Ambulate - Progress: Goal set today  Visit Information  Last PT Received On: 04/04/12    Subjective Data  Subjective: I am planning on going home today. I would like to get some rest before my husband comes.  I have been walking up and down these hallways and I am a little tired.   Prior Functioning  Home Living Lives With: Spouse Available Help at Discharge: Available 24 hours/day Type of Home: House Home Layout: One level Home Adaptive Equipment: Other (comment) (long handled walking sticks) Prior Function Level of Independence: Independent with assistive device(s) Driving: No Communication Communication: No difficulties    Cognition       Extremity/Trunk Assessment     Balance Balance Balance Assessed: Yes Static Sitting Balance Static Sitting - Comment/# of Minutes: 4 minutes EOB w/BLE supported.  Able to hold conversation without difficulty  Dynamic Sitting Balance Dynamic Sitting - Comments: able to reach over for her feet without difficulty  Static Standing Balance Static Standing - Comment/# of Minutes: 8 minutes w/walking sticks and min guard.  .  Able to reach 10 inches in front to grab water.  Able to hold conversation during standing balance.  Leans over during balance to stretch back without difficulty.   End of Session PT - End of Session Equipment Utilized During Treatment: Gait belt Activity Tolerance: Patient tolerated treatment well Patient left: in bed;with call bell/phone within reach  GP     Trevious Rampey 04/04/2012, 2:46 PM

## 2012-04-04 NOTE — Discharge Summary (Signed)
Physician Discharge Summary  Patient ID: Katie Pace MRN: [id number] DOB/AGE: 09-Dec-1935 76 y.o.  Admit date: 04/01/2012 Discharge date: 04/04/2012  Primary Care Physician:  Katie Melena, MD   Discharge Diagnoses:    Principal Problem:  *Sepsis Active Problems:  Sheehan syndrome  Diabetes mellitus  Acute renal failure  Encephalopathy  Vomiting  Hypokalemia  C. difficile diarrhea    Medication List  As of 04/04/2012  4:26 PM   TAKE these medications         acetaminophen 325 MG tablet   Commonly known as: TYLENOL   Take 650 mg by mouth every 6 (six) hours as needed.      alendronate 70 MG tablet   Commonly known as: FOSAMAX   Take 70 mg by mouth every 7 (seven) days. Take with a full glass of water on an empty stomach.      amitriptyline 25 MG tablet   Commonly known as: ELAVIL   Take 25 mg by mouth at bedtime.      aspirin 81 MG tablet   Take 81 mg by mouth daily.      atenolol 50 MG tablet   Commonly known as: TENORMIN   Take 50 mg by mouth daily.      cholecalciferol 1000 UNITS tablet   Commonly known as: VITAMIN D   Take 3,000 Units by mouth daily.      diltiazem 240 MG 24 hr capsule   Commonly known as: CARDIZEM CD   Take 240 mg by mouth daily.      dipyridamole-aspirin 200-25 MG per 12 hr capsule   Commonly known as: AGGRENOX   Take 1 capsule by mouth 2 (two) times daily.      esomeprazole 40 MG capsule   Commonly known as: NEXIUM   Take 40 mg by mouth daily before breakfast.      glucose monitoring kit monitoring kit   1 each by Does not apply route as needed.      HUMATROPE 5 MG Solr   Generic drug: Somatropin   Inject 0.3 mg into the skin every evening.      hydrocortisone 20 MG tablet   Commonly known as: CORTEF   Take 10-20 mg by mouth daily. 20 mg in the morning and 10 mg in the evening      levothyroxine 75 MCG tablet   Commonly known as: SYNTHROID, LEVOTHROID   Take 75 mcg by mouth daily.      lisinopril 10 MG tablet   Commonly  known as: PRINIVIL,ZESTRIL   Take 1 tablet (10 mg total) by mouth daily.      pioglitazone 45 MG tablet   Commonly known as: ACTOS   Take 45 mg by mouth daily.      potassium citrate 10 MEQ (1080 MG) SR tablet   Commonly known as: UROCIT-K   Take 10 mEq by mouth 2 (two) times daily.      rosuvastatin 5 MG tablet   Commonly known as: CRESTOR   Take 5 mg by mouth daily.      saccharomyces boulardii 250 MG capsule   Commonly known as: FLORASTOR   Take 1 capsule (250 mg total) by mouth 2 (two) times daily.      traMADol 50 MG tablet   Commonly known as: ULTRAM   Take 50 mg by mouth every 6 (six) hours as needed.      vancomycin 50 mg/mL oral solution   Commonly known as: VANCOCIN   Take 5 mLs (  250 mg total) by mouth every 6 (six) hours. Until 04/13/12           Discharge Exam: Blood pressure 157/74, pulse 66, temperature 97.8 F (36.6 C), temperature source Oral, resp. rate 16, height 5\' 4"  (1.626 m), weight 70.8 kg (156 lb 1.4 oz), SpO2 94.00%. NAD CTA B S1, S2, RRR Soft, NT, BS+ No edema b/l  Disposition and Follow-up:  Follow up with primary doctor in 2 weeks, will set up home health physical therapy  Consults:  none   Significant Diagnostic Studies:  No results found.  Brief H and P: For complete details please refer to admission H and P, but in brief This is a 76 year old female with a history of Sheehan syndrome, who was recently in the hospital at the beginning of May for chest pain. She also has a history of abdominal aortic aneurysm and had undergone surgical repair in March. Patient lives at home with her husband and was in her usual state of health when he describes on Wednesday that the patient began to feel very weak and having diffuse muscle aches. He reports the following day she began to have vomiting and diarrhea. She had frequent episodes of both was unable to tolerate anything by mouth. As the day progressed her mental status began to deteriorate. She  became increasingly weak and difficult to arouse. According to the husband she has not had any significant cough or shortness of breath. She has not complained of any dysuria. She did complain of some chest pain but her husband feels that this may be due to the persistent vomiting she was experiencing. She was brought to the emergency room where she was noted to be febrile with temperature 104 and hypotensive. The patient has been admitted for workup of sepsis.   Hospital Course:  Principal Problem:  *Sepsis Active Problems:  Sheehan syndrome  Diabetes mellitus  Acute renal failure  Encephalopathy  Vomiting  Hypokalemia  C. difficile diarrhea  This lady was admitted to the hospital with sepsis secondary to Clostridium difficile diarrhea. She was evaluated in the emergency room was found to be hypotensive and febrile. She has significant lethargy due to her sepsis. This quickly improved with IV fluid resuscitation. Stool tested positive for Clostridium difficile and she was placed on appropriate therapy. Blood cultures and urine cultures have otherwise shown no growth. She is no longer febrile and has normal WBC count. Due to her chronic steroid use for Sheehan syndrome, she was placed on stress dose steroids. These have been tapered down to her home dose. Her acute renal failure resolved with IV fluids and was likely secondary to volume depletion. She also did have a mild elevation in her cardiac markers. This was felt to be demand ischemia and she did not have any wall motion abnormalities on echocardiogram. She had recently had a stress Myoview which was found to be low risk study. She is tolerating a regular diet. Her stool output initially required a rectal tube to be placed. This has been discontinued. She's been seen by physical therapy with recommendations for home health physical therapy. She's progressed to the point where she can be transitioned out of the hospital. She will complete a total  of 14 days of oral vancomycin. She'll be discharged home today.  Time spent on Discharge:  Signed: Zan Orlick [facility] Pager: [phone] 04/04/2012, 4:26 PM

## 2012-04-04 NOTE — Progress Notes (Signed)
PICC line RUA removed per order, vaseline gauze and 4x4's applied, pressure held x 2 minutes, secured with hypafix tape, instructed patient to leave dressing in place x 24 hours and not to get dressing wet, verbalized understanding.

## 2012-04-04 NOTE — Discharge Instructions (Signed)
Clostridium Difficile Infection  Clostridium difficile (C. diff) is a bacteria found in the intestinal tract or colon. Under certain conditions, it causes diarrhea and sometimes severe disease. The severe form of the disease is known as pseudomembranous colitis (often called C. diff colitis). This disease can damage the lining of the colon or cause the colon to become enlarged (toxic megacolon).   CAUSES   Your colon normally contains many different bacteria, including C. diff. The balance of bacteria in your colon can change during illness. This is especially true when you take antibiotic medicine. Taking antibiotics may allow the C. diff to grow, multiply excessively, and make a toxin that then causes illness. The elderly and people with certain medical conditions have a greater risk of getting C. diff infections.  SYMPTOMS    Watery diarrhea.   Fever.   Fatigue.   Loss of appetite.   Nausea.   Abdominal swelling, pain, or tenderness.   Dehydration.  DIAGNOSIS   Your symptoms may make your caregiver suspicious of a C. diff infection, especially if you have used antibiotics in the preceding weeks. However, there are only 2 ways to know for certain whether you have a C. diff infection:   A lab test that finds the toxin in your stool.   The specific appearance of an abnormality (pseudomembrane) in your colon. This can only be seen by doing a sigmoidoscopy or colonoscopy. These procedures involve passing an instrument through your rectum to look at the inside of your colon.  Your caregiver will help determine if these tests are necessary.  TREATMENT    Most people are successfully treated with 1 of 2 specific antibiotics, usually given by mouth. Other antibiotics you are receiving are stopped if possible.   Intravenous (IV) fluids and correction of electrolyte imbalance may be necessary.   Rarely, surgery may be needed to remove the infected part of the intestines.   Careful hand washing by you and your  caregivers is important to prevent the spread of infection. In the hospital, your caregivers may also put on gowns and gloves to prevent the spread of the C. diff bacteria. Your room is also cleaned regularly with a hospital grade disinfectant.  HOME CARE INSTRUCTIONS   Drink enough fluids to keep your urine clear or pale yellow. Avoid milk, caffeine, and alcohol.   Ask your caregiver for specific rehydration instructions.   Try eating small, frequent meals rather than large meals.   Take your antibiotics as directed. Finish them even if you start to feel better.   Do not use medicines to slow diarrhea. This could delay healing or cause complications.   Wash your hands thoroughly after using the bathroom and before preparing food.   Make sure people who live with you wash their hands often, too.  SEEK MEDICAL CARE IF:   Diarrhea persists longer than expected or recurs after completing your course of antibiotic treatment for the C. diff infection.   You have trouble staying hydrated.  SEEK IMMEDIATE MEDICAL CARE IF:   You develop a new fever.   You have increasing abdominal pain or tenderness.   There is blood in your stools, or your stools are dark black and tarry.   You cannot hold down food or liquids.  MAKE SURE YOU:    Understand these instructions.   Will watch your condition.   Will get help right away if you are not doing well or get worse.  Document Released: 07/01/2005 Document Revised: 09/10/2011 Document 

## 2012-04-06 DIAGNOSIS — E119 Type 2 diabetes mellitus without complications: Secondary | ICD-10-CM | POA: Diagnosis not present

## 2012-04-06 DIAGNOSIS — R262 Difficulty in walking, not elsewhere classified: Secondary | ICD-10-CM | POA: Diagnosis not present

## 2012-04-06 DIAGNOSIS — I10 Essential (primary) hypertension: Secondary | ICD-10-CM | POA: Diagnosis not present

## 2012-04-06 DIAGNOSIS — A0472 Enterocolitis due to Clostridium difficile, not specified as recurrent: Secondary | ICD-10-CM | POA: Diagnosis not present

## 2012-04-06 DIAGNOSIS — A419 Sepsis, unspecified organism: Secondary | ICD-10-CM | POA: Diagnosis not present

## 2012-04-06 DIAGNOSIS — IMO0001 Reserved for inherently not codable concepts without codable children: Secondary | ICD-10-CM | POA: Diagnosis not present

## 2012-04-06 LAB — CULTURE, BLOOD (ROUTINE X 2)

## 2012-04-08 DIAGNOSIS — D649 Anemia, unspecified: Secondary | ICD-10-CM | POA: Diagnosis not present

## 2012-04-08 DIAGNOSIS — K5289 Other specified noninfective gastroenteritis and colitis: Secondary | ICD-10-CM | POA: Diagnosis not present

## 2012-04-14 DIAGNOSIS — R262 Difficulty in walking, not elsewhere classified: Secondary | ICD-10-CM | POA: Diagnosis not present

## 2012-04-14 DIAGNOSIS — IMO0001 Reserved for inherently not codable concepts without codable children: Secondary | ICD-10-CM | POA: Diagnosis not present

## 2012-04-14 DIAGNOSIS — A419 Sepsis, unspecified organism: Secondary | ICD-10-CM | POA: Diagnosis not present

## 2012-04-14 DIAGNOSIS — E119 Type 2 diabetes mellitus without complications: Secondary | ICD-10-CM | POA: Diagnosis not present

## 2012-04-14 DIAGNOSIS — A0472 Enterocolitis due to Clostridium difficile, not specified as recurrent: Secondary | ICD-10-CM | POA: Diagnosis not present

## 2012-04-14 DIAGNOSIS — I10 Essential (primary) hypertension: Secondary | ICD-10-CM | POA: Diagnosis not present

## 2012-04-15 DIAGNOSIS — IMO0001 Reserved for inherently not codable concepts without codable children: Secondary | ICD-10-CM | POA: Diagnosis not present

## 2012-04-15 DIAGNOSIS — A419 Sepsis, unspecified organism: Secondary | ICD-10-CM | POA: Diagnosis not present

## 2012-04-15 DIAGNOSIS — I10 Essential (primary) hypertension: Secondary | ICD-10-CM | POA: Diagnosis not present

## 2012-04-15 DIAGNOSIS — R262 Difficulty in walking, not elsewhere classified: Secondary | ICD-10-CM | POA: Diagnosis not present

## 2012-04-15 DIAGNOSIS — E119 Type 2 diabetes mellitus without complications: Secondary | ICD-10-CM | POA: Diagnosis not present

## 2012-04-15 DIAGNOSIS — A0472 Enterocolitis due to Clostridium difficile, not specified as recurrent: Secondary | ICD-10-CM | POA: Diagnosis not present

## 2012-04-18 DIAGNOSIS — A419 Sepsis, unspecified organism: Secondary | ICD-10-CM | POA: Diagnosis not present

## 2012-04-18 DIAGNOSIS — IMO0001 Reserved for inherently not codable concepts without codable children: Secondary | ICD-10-CM | POA: Diagnosis not present

## 2012-04-18 DIAGNOSIS — I10 Essential (primary) hypertension: Secondary | ICD-10-CM | POA: Diagnosis not present

## 2012-04-18 DIAGNOSIS — R262 Difficulty in walking, not elsewhere classified: Secondary | ICD-10-CM | POA: Diagnosis not present

## 2012-04-18 DIAGNOSIS — E119 Type 2 diabetes mellitus without complications: Secondary | ICD-10-CM | POA: Diagnosis not present

## 2012-04-18 DIAGNOSIS — A0472 Enterocolitis due to Clostridium difficile, not specified as recurrent: Secondary | ICD-10-CM | POA: Diagnosis not present

## 2012-04-20 DIAGNOSIS — A419 Sepsis, unspecified organism: Secondary | ICD-10-CM | POA: Diagnosis not present

## 2012-04-20 DIAGNOSIS — E119 Type 2 diabetes mellitus without complications: Secondary | ICD-10-CM | POA: Diagnosis not present

## 2012-04-20 DIAGNOSIS — I10 Essential (primary) hypertension: Secondary | ICD-10-CM | POA: Diagnosis not present

## 2012-04-20 DIAGNOSIS — IMO0001 Reserved for inherently not codable concepts without codable children: Secondary | ICD-10-CM | POA: Diagnosis not present

## 2012-04-20 DIAGNOSIS — A0472 Enterocolitis due to Clostridium difficile, not specified as recurrent: Secondary | ICD-10-CM | POA: Diagnosis not present

## 2012-04-20 DIAGNOSIS — R262 Difficulty in walking, not elsewhere classified: Secondary | ICD-10-CM | POA: Diagnosis not present

## 2012-04-25 DIAGNOSIS — A419 Sepsis, unspecified organism: Secondary | ICD-10-CM | POA: Diagnosis not present

## 2012-04-25 DIAGNOSIS — A0472 Enterocolitis due to Clostridium difficile, not specified as recurrent: Secondary | ICD-10-CM | POA: Diagnosis not present

## 2012-04-25 DIAGNOSIS — I10 Essential (primary) hypertension: Secondary | ICD-10-CM | POA: Diagnosis not present

## 2012-04-25 DIAGNOSIS — E119 Type 2 diabetes mellitus without complications: Secondary | ICD-10-CM | POA: Diagnosis not present

## 2012-04-25 DIAGNOSIS — R262 Difficulty in walking, not elsewhere classified: Secondary | ICD-10-CM | POA: Diagnosis not present

## 2012-04-25 DIAGNOSIS — IMO0001 Reserved for inherently not codable concepts without codable children: Secondary | ICD-10-CM | POA: Diagnosis not present

## 2012-04-27 DIAGNOSIS — R262 Difficulty in walking, not elsewhere classified: Secondary | ICD-10-CM | POA: Diagnosis not present

## 2012-04-27 DIAGNOSIS — IMO0001 Reserved for inherently not codable concepts without codable children: Secondary | ICD-10-CM | POA: Diagnosis not present

## 2012-04-27 DIAGNOSIS — A0472 Enterocolitis due to Clostridium difficile, not specified as recurrent: Secondary | ICD-10-CM | POA: Diagnosis not present

## 2012-04-27 DIAGNOSIS — A419 Sepsis, unspecified organism: Secondary | ICD-10-CM | POA: Diagnosis not present

## 2012-04-27 DIAGNOSIS — E119 Type 2 diabetes mellitus without complications: Secondary | ICD-10-CM | POA: Diagnosis not present

## 2012-04-27 DIAGNOSIS — I10 Essential (primary) hypertension: Secondary | ICD-10-CM | POA: Diagnosis not present

## 2012-05-03 DIAGNOSIS — N390 Urinary tract infection, site not specified: Secondary | ICD-10-CM | POA: Diagnosis not present

## 2012-05-18 DIAGNOSIS — M1710 Unilateral primary osteoarthritis, unspecified knee: Secondary | ICD-10-CM | POA: Diagnosis not present

## 2012-06-01 DIAGNOSIS — E119 Type 2 diabetes mellitus without complications: Secondary | ICD-10-CM | POA: Diagnosis not present

## 2012-06-01 DIAGNOSIS — I10 Essential (primary) hypertension: Secondary | ICD-10-CM | POA: Diagnosis not present

## 2012-06-01 DIAGNOSIS — E039 Hypothyroidism, unspecified: Secondary | ICD-10-CM | POA: Diagnosis not present

## 2012-06-01 DIAGNOSIS — E785 Hyperlipidemia, unspecified: Secondary | ICD-10-CM | POA: Diagnosis not present

## 2012-06-15 DIAGNOSIS — Z23 Encounter for immunization: Secondary | ICD-10-CM | POA: Diagnosis not present

## 2012-06-17 DIAGNOSIS — M1710 Unilateral primary osteoarthritis, unspecified knee: Secondary | ICD-10-CM | POA: Diagnosis not present

## 2012-06-20 DIAGNOSIS — H259 Unspecified age-related cataract: Secondary | ICD-10-CM | POA: Diagnosis not present

## 2012-06-20 DIAGNOSIS — H43819 Vitreous degeneration, unspecified eye: Secondary | ICD-10-CM | POA: Diagnosis not present

## 2012-06-20 DIAGNOSIS — E119 Type 2 diabetes mellitus without complications: Secondary | ICD-10-CM | POA: Diagnosis not present

## 2012-06-27 DIAGNOSIS — M1710 Unilateral primary osteoarthritis, unspecified knee: Secondary | ICD-10-CM | POA: Diagnosis not present

## 2012-07-27 DIAGNOSIS — M47817 Spondylosis without myelopathy or radiculopathy, lumbosacral region: Secondary | ICD-10-CM | POA: Diagnosis not present

## 2012-07-27 DIAGNOSIS — M48061 Spinal stenosis, lumbar region without neurogenic claudication: Secondary | ICD-10-CM | POA: Diagnosis not present

## 2012-07-28 ENCOUNTER — Other Ambulatory Visit: Payer: Self-pay | Admitting: Neurological Surgery

## 2012-07-28 DIAGNOSIS — M48061 Spinal stenosis, lumbar region without neurogenic claudication: Secondary | ICD-10-CM

## 2012-08-03 ENCOUNTER — Ambulatory Visit
Admission: RE | Admit: 2012-08-03 | Discharge: 2012-08-03 | Disposition: A | Discharge status: Home / Self Care | Payer: Medicare Other | Source: Ambulatory Visit | Attending: Neurological Surgery | Admitting: Neurological Surgery

## 2012-08-03 DIAGNOSIS — M549 Dorsalgia, unspecified: Secondary | ICD-10-CM | POA: Diagnosis not present

## 2012-08-03 DIAGNOSIS — M48061 Spinal stenosis, lumbar region without neurogenic claudication: Secondary | ICD-10-CM

## 2012-08-16 DIAGNOSIS — Z85828 Personal history of other malignant neoplasm of skin: Secondary | ICD-10-CM | POA: Diagnosis not present

## 2012-08-16 DIAGNOSIS — L570 Actinic keratosis: Secondary | ICD-10-CM | POA: Diagnosis not present

## 2012-08-18 DIAGNOSIS — Q762 Congenital spondylolisthesis: Secondary | ICD-10-CM | POA: Diagnosis not present

## 2012-08-18 DIAGNOSIS — E538 Deficiency of other specified B group vitamins: Secondary | ICD-10-CM | POA: Diagnosis not present

## 2012-08-18 DIAGNOSIS — E119 Type 2 diabetes mellitus without complications: Secondary | ICD-10-CM | POA: Diagnosis not present

## 2012-08-18 DIAGNOSIS — E230 Hypopituitarism: Secondary | ICD-10-CM | POA: Diagnosis not present

## 2012-08-18 DIAGNOSIS — M48061 Spinal stenosis, lumbar region without neurogenic claudication: Secondary | ICD-10-CM | POA: Diagnosis not present

## 2012-08-19 DIAGNOSIS — M48061 Spinal stenosis, lumbar region without neurogenic claudication: Secondary | ICD-10-CM | POA: Diagnosis not present

## 2012-08-19 DIAGNOSIS — M47817 Spondylosis without myelopathy or radiculopathy, lumbosacral region: Secondary | ICD-10-CM | POA: Diagnosis not present

## 2012-08-22 ENCOUNTER — Other Ambulatory Visit: Payer: Self-pay | Admitting: Neurological Surgery

## 2012-08-22 DIAGNOSIS — E538 Deficiency of other specified B group vitamins: Secondary | ICD-10-CM | POA: Diagnosis not present

## 2012-08-22 DIAGNOSIS — M8440XA Pathological fracture, unspecified site, initial encounter for fracture: Secondary | ICD-10-CM | POA: Diagnosis not present

## 2012-08-22 DIAGNOSIS — G609 Hereditary and idiopathic neuropathy, unspecified: Secondary | ICD-10-CM | POA: Diagnosis not present

## 2012-08-22 DIAGNOSIS — E559 Vitamin D deficiency, unspecified: Secondary | ICD-10-CM | POA: Diagnosis not present

## 2012-08-22 DIAGNOSIS — E119 Type 2 diabetes mellitus without complications: Secondary | ICD-10-CM | POA: Diagnosis not present

## 2012-08-22 DIAGNOSIS — E230 Hypopituitarism: Secondary | ICD-10-CM | POA: Diagnosis not present

## 2012-10-04 ENCOUNTER — Encounter (HOSPITAL_COMMUNITY): Payer: Self-pay | Admitting: Pharmacy Technician

## 2012-10-11 ENCOUNTER — Encounter (HOSPITAL_COMMUNITY)
Admission: RE | Admit: 2012-10-11 | Discharge: 2012-10-11 | Disposition: A | Discharge status: Home / Self Care | Payer: Medicare Other | Source: Ambulatory Visit | Attending: Anesthesiology | Admitting: Anesthesiology

## 2012-10-11 ENCOUNTER — Encounter (HOSPITAL_COMMUNITY): Payer: Self-pay

## 2012-10-11 ENCOUNTER — Encounter (HOSPITAL_COMMUNITY)
Admission: RE | Admit: 2012-10-11 | Discharge: 2012-10-11 | Disposition: A | Discharge status: Home / Self Care | Payer: Medicare Other | Source: Ambulatory Visit | Attending: Neurological Surgery | Admitting: Neurological Surgery

## 2012-10-11 DIAGNOSIS — Z01818 Encounter for other preprocedural examination: Secondary | ICD-10-CM | POA: Diagnosis not present

## 2012-10-11 DIAGNOSIS — M4310 Spondylolisthesis, site unspecified: Secondary | ICD-10-CM | POA: Diagnosis not present

## 2012-10-11 DIAGNOSIS — D62 Acute posthemorrhagic anemia: Secondary | ICD-10-CM | POA: Diagnosis not present

## 2012-10-11 DIAGNOSIS — J449 Chronic obstructive pulmonary disease, unspecified: Secondary | ICD-10-CM | POA: Diagnosis not present

## 2012-10-11 DIAGNOSIS — E230 Hypopituitarism: Secondary | ICD-10-CM | POA: Diagnosis not present

## 2012-10-11 DIAGNOSIS — G8918 Other acute postprocedural pain: Secondary | ICD-10-CM | POA: Diagnosis not present

## 2012-10-11 HISTORY — DX: Unspecified osteoarthritis, unspecified site: M19.90

## 2012-10-11 HISTORY — DX: Cardiac murmur, unspecified: R01.1

## 2012-10-11 HISTORY — DX: Anemia, unspecified: D64.9

## 2012-10-11 HISTORY — DX: Pneumonia, unspecified organism: J18.9

## 2012-10-11 LAB — BASIC METABOLIC PANEL
CO2: 30 mEq/L (ref 19–32)
Calcium: 8.9 mg/dL (ref 8.4–10.5)
GFR calc Af Amer: 55 mL/min — ABNORMAL LOW (ref >90)
GFR calc non Af Amer: 48 mL/min — ABNORMAL LOW (ref >90)
Sodium: 140 mEq/L (ref 135–145)

## 2012-10-11 LAB — CBC
Platelets: 190 10*3/uL (ref 150–400)
RBC: 3.81 MIL/uL — ABNORMAL LOW (ref 3.87–5.11)
WBC: 8.7 10*3/uL (ref 4.0–10.5)

## 2012-10-11 LAB — SURGICAL PCR SCREEN: Staphylococcus aureus: NEGATIVE

## 2012-10-11 LAB — TYPE AND SCREEN
ABO/RH(D): A POS
Antibody Screen: NEGATIVE

## 2012-10-11 NOTE — Pre-Procedure Instructions (Addendum)
20 Memorie T Kwiecinski  10/11/2012   Your procedure is scheduled on:  10/18/12  Report to [facility] at 530AM.  Call this number if you have problems the morning of surgery: [phone]   Remember:   Do not eat food:or drinkAfter Midnight.    Take these medicines the morning of surgery with A SIP OF WATER: elavil, atenolol, diltiazem, levothroxine, tramadol          aggrenox per dr 10/11/12   Do not wear jewelry, make-up or nail polish.  Do not wear lotions, powders, or perfumes. You may not wear deodorant.  Do not shave 48 hours prior to surgery. Men may shave face and neck.  Do not bring valuables to the hospital.  Contacts, dentures or bridgework may not be worn into surgery.  Leave suitcase in the car. After surgery it may be brought to your room.  For patients admitted to the hospital, checkout time is 11:00 AM the day of discharge.   Patients discharged the day of surgery will not be allowed to drive home.  Name and phone number of your driver: richard spouse [phone]  Special Instructions: Shower using CHG 2 nights before surgery and the night before surgery.  If you shower the day of surgery use CHG.  Use special wash - you have one bottle of CHG for all showers.  You should use approximately 1/3 of the bottle for each shower.   Please read over the following fact sheets that you were given: Pain Booklet, Coughing and Deep Breathing, Blood Transfusion Information, MRSA Information and Surgical Site Infection Prevention

## 2012-10-11 NOTE — Progress Notes (Signed)
Echo 6/13

## 2012-10-17 MED ORDER — VANCOMYCIN HCL IN DEXTROSE 1-5 GM/200ML-% IV SOLN
1000.0000 mg | INTRAVENOUS | Status: AC
  Administered 2012-10-18: 1000 mg via INTRAVENOUS
  Filled 2012-10-17: qty 200

## 2012-10-18 ENCOUNTER — Encounter (HOSPITAL_COMMUNITY): Payer: Self-pay | Admitting: Anesthesiology

## 2012-10-18 ENCOUNTER — Inpatient Hospital Stay (HOSPITAL_COMMUNITY): Payer: Medicare Other

## 2012-10-18 ENCOUNTER — Inpatient Hospital Stay (HOSPITAL_COMMUNITY)
Admission: RE | Admit: 2012-10-18 | Discharge: 2012-10-27 | DRG: 460 | Disposition: A | Discharge status: Home / Self Care | Payer: Medicare Other | Source: Ambulatory Visit | Attending: Neurological Surgery | Admitting: Neurological Surgery

## 2012-10-18 ENCOUNTER — Inpatient Hospital Stay (HOSPITAL_COMMUNITY): Payer: Medicare Other | Admitting: Anesthesiology

## 2012-10-18 ENCOUNTER — Encounter (HOSPITAL_COMMUNITY): Payer: Self-pay | Admitting: *Deleted

## 2012-10-18 ENCOUNTER — Encounter (HOSPITAL_COMMUNITY)
Admission: RE | Disposition: A | Discharge status: Home / Self Care | Payer: Self-pay | Source: Ambulatory Visit | Attending: Neurological Surgery

## 2012-10-18 DIAGNOSIS — I10 Essential (primary) hypertension: Secondary | ICD-10-CM | POA: Diagnosis not present

## 2012-10-18 DIAGNOSIS — Z01812 Encounter for preprocedural laboratory examination: Secondary | ICD-10-CM | POA: Diagnosis not present

## 2012-10-18 DIAGNOSIS — Z880 Allergy status to penicillin: Secondary | ICD-10-CM

## 2012-10-18 DIAGNOSIS — Z96659 Presence of unspecified artificial knee joint: Secondary | ICD-10-CM | POA: Diagnosis not present

## 2012-10-18 DIAGNOSIS — N182 Chronic kidney disease, stage 2 (mild): Secondary | ICD-10-CM | POA: Diagnosis present

## 2012-10-18 DIAGNOSIS — I129 Hypertensive chronic kidney disease with stage 1 through stage 4 chronic kidney disease, or unspecified chronic kidney disease: Secondary | ICD-10-CM | POA: Diagnosis present

## 2012-10-18 DIAGNOSIS — Z87891 Personal history of nicotine dependence: Secondary | ICD-10-CM | POA: Diagnosis not present

## 2012-10-18 DIAGNOSIS — E230 Hypopituitarism: Secondary | ICD-10-CM | POA: Diagnosis present

## 2012-10-18 DIAGNOSIS — Z881 Allergy status to other antibiotic agents status: Secondary | ICD-10-CM | POA: Diagnosis not present

## 2012-10-18 DIAGNOSIS — Z7982 Long term (current) use of aspirin: Secondary | ICD-10-CM | POA: Diagnosis not present

## 2012-10-18 DIAGNOSIS — D539 Nutritional anemia, unspecified: Secondary | ICD-10-CM | POA: Diagnosis present

## 2012-10-18 DIAGNOSIS — E039 Hypothyroidism, unspecified: Secondary | ICD-10-CM | POA: Diagnosis present

## 2012-10-18 DIAGNOSIS — E119 Type 2 diabetes mellitus without complications: Secondary | ICD-10-CM | POA: Diagnosis present

## 2012-10-18 DIAGNOSIS — M4310 Spondylolisthesis, site unspecified: Principal | ICD-10-CM | POA: Diagnosis present

## 2012-10-18 DIAGNOSIS — Z8249 Family history of ischemic heart disease and other diseases of the circulatory system: Secondary | ICD-10-CM | POA: Diagnosis not present

## 2012-10-18 DIAGNOSIS — Z888 Allergy status to other drugs, medicaments and biological substances status: Secondary | ICD-10-CM | POA: Diagnosis not present

## 2012-10-18 DIAGNOSIS — Z96649 Presence of unspecified artificial hip joint: Secondary | ICD-10-CM

## 2012-10-18 DIAGNOSIS — D62 Acute posthemorrhagic anemia: Secondary | ICD-10-CM | POA: Diagnosis not present

## 2012-10-18 DIAGNOSIS — D649 Anemia, unspecified: Secondary | ICD-10-CM | POA: Diagnosis not present

## 2012-10-18 DIAGNOSIS — G8918 Other acute postprocedural pain: Secondary | ICD-10-CM | POA: Diagnosis not present

## 2012-10-18 DIAGNOSIS — K5900 Constipation, unspecified: Secondary | ICD-10-CM | POA: Diagnosis present

## 2012-10-18 DIAGNOSIS — M549 Dorsalgia, unspecified: Secondary | ICD-10-CM | POA: Diagnosis not present

## 2012-10-18 DIAGNOSIS — Q762 Congenital spondylolisthesis: Secondary | ICD-10-CM | POA: Diagnosis not present

## 2012-10-18 DIAGNOSIS — I739 Peripheral vascular disease, unspecified: Secondary | ICD-10-CM | POA: Diagnosis present

## 2012-10-18 DIAGNOSIS — K219 Gastro-esophageal reflux disease without esophagitis: Secondary | ICD-10-CM | POA: Diagnosis present

## 2012-10-18 DIAGNOSIS — Z79899 Other long term (current) drug therapy: Secondary | ICD-10-CM

## 2012-10-18 DIAGNOSIS — M48061 Spinal stenosis, lumbar region without neurogenic claudication: Secondary | ICD-10-CM | POA: Diagnosis not present

## 2012-10-18 DIAGNOSIS — M47817 Spondylosis without myelopathy or radiculopathy, lumbosacral region: Secondary | ICD-10-CM | POA: Diagnosis not present

## 2012-10-18 DIAGNOSIS — IMO0002 Reserved for concepts with insufficient information to code with codable children: Secondary | ICD-10-CM | POA: Diagnosis not present

## 2012-10-18 DIAGNOSIS — M4316 Spondylolisthesis, lumbar region: Secondary | ICD-10-CM | POA: Diagnosis present

## 2012-10-18 HISTORY — PX: LUMBAR LAMINECTOMY: SHX95

## 2012-10-18 LAB — GLUCOSE, CAPILLARY: Glucose-Capillary: 96 mg/dL (ref 70–99)

## 2012-10-18 SURGERY — POSTERIOR LUMBAR FUSION 2 LEVEL
Anesthesia: General | Site: Back | Wound class: Clean

## 2012-10-18 MED ORDER — SODIUM CHLORIDE 0.9 % IJ SOLN: 3.0000 mL | INTRAMUSCULAR | Status: DC | PRN

## 2012-10-18 MED ORDER — POTASSIUM CITRATE ER 10 MEQ (1080 MG) PO TBCR
20.0000 meq | EXTENDED_RELEASE_TABLET | Freq: Every morning | ORAL | Status: DC
  Administered 2012-10-19: 20 meq via ORAL
  Administered 2012-10-20: 10 meq via ORAL
  Administered 2012-10-21 – 2012-10-27 (×7): 20 meq via ORAL
  Filled 2012-10-18 (×9): qty 2

## 2012-10-18 MED ORDER — ACETAMINOPHEN 10 MG/ML IV SOLN
INTRAVENOUS | Status: AC
  Filled 2012-10-18: qty 100

## 2012-10-18 MED ORDER — PHENYLEPHRINE HCL 10 MG/ML IJ SOLN
INTRAMUSCULAR | Status: DC | PRN
  Administered 2012-10-18 (×3): 40 ug via INTRAVENOUS

## 2012-10-18 MED ORDER — AMITRIPTYLINE HCL 25 MG PO TABS
25.0000 mg | ORAL_TABLET | Freq: Every day | ORAL | Status: DC
  Administered 2012-10-18 – 2012-10-26 (×9): 25 mg via ORAL
  Filled 2012-10-18 (×12): qty 1

## 2012-10-18 MED ORDER — EPHEDRINE SULFATE 50 MG/ML IJ SOLN
INTRAMUSCULAR | Status: DC | PRN
  Administered 2012-10-18: 10 mg via INTRAVENOUS
  Administered 2012-10-18: 5 mg via INTRAVENOUS

## 2012-10-18 MED ORDER — OXYCODONE-ACETAMINOPHEN 5-325 MG PO TABS
1.0000 | ORAL_TABLET | ORAL | Status: DC | PRN
  Administered 2012-10-19 – 2012-10-27 (×27): 2 via ORAL
  Filled 2012-10-18 (×29): qty 2

## 2012-10-18 MED ORDER — ALUM & MAG HYDROXIDE-SIMETH 200-200-20 MG/5ML PO SUSP: 30.0000 mL | Freq: Four times a day (QID) | ORAL | Status: DC | PRN

## 2012-10-18 MED ORDER — LIDOCAINE-EPINEPHRINE 1 %-1:100000 IJ SOLN
INTRAMUSCULAR | Status: DC | PRN
  Administered 2012-10-18: 5 mL

## 2012-10-18 MED ORDER — HYDROCORTISONE SOD SUCCINATE 100 MG IJ SOLR
INTRAMUSCULAR | Status: DC | PRN
  Administered 2012-10-18: 100 mg via INTRAVENOUS

## 2012-10-18 MED ORDER — SODIUM CHLORIDE 0.9 % IJ SOLN
3.0000 mL | Freq: Two times a day (BID) | INTRAMUSCULAR | Status: DC
  Administered 2012-10-19 – 2012-10-25 (×6): 3 mL via INTRAVENOUS

## 2012-10-18 MED ORDER — SODIUM CHLORIDE 0.9 % IV SOLN
250.0000 mL | INTRAVENOUS | Status: DC
  Administered 2012-10-18: 250 mL via INTRAVENOUS

## 2012-10-18 MED ORDER — PIOGLITAZONE HCL 45 MG PO TABS
45.0000 mg | ORAL_TABLET | Freq: Every day | ORAL | Status: DC
  Administered 2012-10-19 – 2012-10-27 (×9): 45 mg via ORAL
  Filled 2012-10-18 (×11): qty 1

## 2012-10-18 MED ORDER — PHENOL 1.4 % MT LIQD: 1.0000 | OROMUCOSAL | Status: DC | PRN

## 2012-10-18 MED ORDER — PHENYLEPHRINE HCL 10 MG/ML IJ SOLN
10.0000 mg | INTRAVENOUS | Status: DC | PRN
  Administered 2012-10-18: 30 ug/min via INTRAVENOUS

## 2012-10-18 MED ORDER — THROMBIN 20000 UNITS EX SOLR
CUTANEOUS | Status: DC | PRN
  Administered 2012-10-18: 09:00:00 via TOPICAL

## 2012-10-18 MED ORDER — SODIUM CHLORIDE 0.9 % IV SOLN
INTRAVENOUS | Status: DC
  Administered 2012-10-18 – 2012-10-19 (×2): via INTRAVENOUS

## 2012-10-18 MED ORDER — HYDROMORPHONE HCL PF 1 MG/ML IJ SOLN
0.2500 mg | INTRAMUSCULAR | Status: DC | PRN
  Administered 2012-10-18 (×2): 0.5 mg via INTRAVENOUS

## 2012-10-18 MED ORDER — LACTATED RINGERS IV SOLN
INTRAVENOUS | Status: DC | PRN
  Administered 2012-10-18 (×2): via INTRAVENOUS

## 2012-10-18 MED ORDER — SODIUM CHLORIDE 0.9 % IV SOLN
INTRAVENOUS | Status: DC | PRN
  Administered 2012-10-18: 12:00:00 via INTRAVENOUS

## 2012-10-18 MED ORDER — VECURONIUM BROMIDE 10 MG IV SOLR
INTRAVENOUS | Status: DC | PRN
  Administered 2012-10-18 (×2): 1 mg via INTRAVENOUS

## 2012-10-18 MED ORDER — INSULIN ASPART 100 UNIT/ML ~~LOC~~ SOLN
0.0000 [IU] | Freq: Three times a day (TID) | SUBCUTANEOUS | Status: DC
  Administered 2012-10-18 – 2012-10-19 (×2): 1 [IU] via SUBCUTANEOUS
  Administered 2012-10-19: 3 [IU] via SUBCUTANEOUS

## 2012-10-18 MED ORDER — PANTOPRAZOLE SODIUM 40 MG PO TBEC
80.0000 mg | DELAYED_RELEASE_TABLET | Freq: Every day | ORAL | Status: DC
  Administered 2012-10-19 – 2012-10-27 (×9): 80 mg via ORAL
  Filled 2012-10-18 (×2): qty 2
  Filled 2012-10-18: qty 1
  Filled 2012-10-18 (×2): qty 2
  Filled 2012-10-18 (×2): qty 1
  Filled 2012-10-18: qty 2

## 2012-10-18 MED ORDER — FENTANYL CITRATE 0.05 MG/ML IJ SOLN
INTRAMUSCULAR | Status: DC | PRN
  Administered 2012-10-18: 50 ug via INTRAVENOUS
  Administered 2012-10-18 (×2): 100 ug via INTRAVENOUS

## 2012-10-18 MED ORDER — 0.9 % SODIUM CHLORIDE (POUR BTL) OPTIME
TOPICAL | Status: DC | PRN
  Administered 2012-10-18: 1000 mL

## 2012-10-18 MED ORDER — MORPHINE SULFATE 2 MG/ML IJ SOLN
1.0000 mg | INTRAMUSCULAR | Status: DC | PRN
  Administered 2012-10-20 – 2012-10-23 (×4): 2 mg via INTRAVENOUS
  Filled 2012-10-18 (×4): qty 1

## 2012-10-18 MED ORDER — MENTHOL 3 MG MT LOZG: 1.0000 | LOZENGE | OROMUCOSAL | Status: DC | PRN

## 2012-10-18 MED ORDER — METHYLPREDNISOLONE SODIUM SUCC 125 MG IJ SOLR
60.0000 mg | Freq: Two times a day (BID) | INTRAMUSCULAR | Status: DC
  Administered 2012-10-18 – 2012-10-20 (×4): 60 mg via INTRAVENOUS
  Filled 2012-10-18 (×6): qty 0.96

## 2012-10-18 MED ORDER — ACETAMINOPHEN 650 MG RE SUPP: 650.0000 mg | RECTAL | Status: DC | PRN

## 2012-10-18 MED ORDER — HYDROCORTISONE 10 MG PO TABS
10.0000 mg | ORAL_TABLET | Freq: Every day | ORAL | Status: DC
  Administered 2012-10-18 – 2012-10-26 (×9): 10 mg via ORAL
  Filled 2012-10-18 (×11): qty 1

## 2012-10-18 MED ORDER — CEFAZOLIN SODIUM 1-5 GM-% IV SOLN
1.0000 g | Freq: Three times a day (TID) | INTRAVENOUS | Status: AC
  Administered 2012-10-18 – 2012-10-19 (×2): 1 g via INTRAVENOUS
  Filled 2012-10-18 (×2): qty 50

## 2012-10-18 MED ORDER — DEXTROSE 5 % IV SOLN
500.0000 mg | Freq: Four times a day (QID) | INTRAVENOUS | Status: DC | PRN
  Administered 2012-10-18: 500 mg via INTRAVENOUS
  Filled 2012-10-18: qty 5

## 2012-10-18 MED ORDER — SOMATROPIN 5 MG IJ SOLR
0.3000 mg | Freq: Every evening | INTRAMUSCULAR | Status: DC
  Administered 2012-10-18 – 2012-10-26 (×9): 0.3 mg via SUBCUTANEOUS
  Filled 2012-10-18 (×9): qty 0.3

## 2012-10-18 MED ORDER — BUPIVACAINE HCL (PF) 0.5 % IJ SOLN
INTRAMUSCULAR | Status: DC | PRN
  Administered 2012-10-18: 5 mL

## 2012-10-18 MED ORDER — LIDOCAINE HCL (CARDIAC) 20 MG/ML IV SOLN
INTRAVENOUS | Status: DC | PRN
  Administered 2012-10-18: 100 mg via INTRAVENOUS

## 2012-10-18 MED ORDER — ONDANSETRON HCL 4 MG/2ML IJ SOLN
4.0000 mg | INTRAMUSCULAR | Status: DC | PRN
  Administered 2012-10-18: 4 mg via INTRAVENOUS
  Filled 2012-10-18: qty 2

## 2012-10-18 MED ORDER — ONDANSETRON HCL 4 MG/2ML IJ SOLN: 4.0000 mg | Freq: Once | INTRAMUSCULAR | Status: DC | PRN

## 2012-10-18 MED ORDER — LEVOTHYROXINE SODIUM 75 MCG PO TABS
75.0000 ug | ORAL_TABLET | Freq: Every day | ORAL | Status: DC
  Administered 2012-10-19 – 2012-10-27 (×9): 75 ug via ORAL
  Filled 2012-10-18 (×10): qty 1

## 2012-10-18 MED ORDER — SODIUM CHLORIDE 0.9 % IR SOLN
Status: DC | PRN
  Administered 2012-10-18: 09:00:00

## 2012-10-18 MED ORDER — ONDANSETRON HCL 4 MG/2ML IJ SOLN
INTRAMUSCULAR | Status: DC | PRN
  Administered 2012-10-18: 4 mg via INTRAVENOUS

## 2012-10-18 MED ORDER — LISINOPRIL 10 MG PO TABS
10.0000 mg | ORAL_TABLET | Freq: Every day | ORAL | Status: DC
  Administered 2012-10-18 – 2012-10-21 (×4): 10 mg via ORAL
  Filled 2012-10-18 (×4): qty 1

## 2012-10-18 MED ORDER — SODIUM CHLORIDE 0.9 % IV SOLN
INTRAVENOUS | Status: AC
  Filled 2012-10-18: qty 500

## 2012-10-18 MED ORDER — ALBUMIN HUMAN 5 % IV SOLN
INTRAVENOUS | Status: DC | PRN
  Administered 2012-10-18: 11:00:00 via INTRAVENOUS

## 2012-10-18 MED ORDER — DILTIAZEM HCL ER COATED BEADS 240 MG PO CP24
240.0000 mg | ORAL_CAPSULE | Freq: Every day | ORAL | Status: DC
  Administered 2012-10-18 – 2012-10-27 (×11): 240 mg via ORAL
  Filled 2012-10-18 (×11): qty 1

## 2012-10-18 MED ORDER — ROCURONIUM BROMIDE 100 MG/10ML IV SOLN
INTRAVENOUS | Status: DC | PRN
  Administered 2012-10-18: 50 mg via INTRAVENOUS

## 2012-10-18 MED ORDER — HYDROCODONE-ACETAMINOPHEN 5-325 MG PO TABS
1.0000 | ORAL_TABLET | ORAL | Status: DC | PRN
  Administered 2012-10-18 – 2012-10-27 (×11): 2 via ORAL
  Filled 2012-10-18 (×2): qty 2
  Filled 2012-10-18: qty 1
  Filled 2012-10-18 (×6): qty 2
  Filled 2012-10-18: qty 1
  Filled 2012-10-18 (×3): qty 2

## 2012-10-18 MED ORDER — ACETAMINOPHEN 10 MG/ML IV SOLN
1000.0000 mg | Freq: Once | INTRAVENOUS | Status: AC | PRN
  Administered 2012-10-18: 1000 mg via INTRAVENOUS

## 2012-10-18 MED ORDER — INSULIN ASPART 100 UNIT/ML ~~LOC~~ SOLN
0.0000 [IU] | Freq: Every day | SUBCUTANEOUS | Status: DC
  Administered 2012-10-20: 2 [IU] via SUBCUTANEOUS

## 2012-10-18 MED ORDER — HYDROMORPHONE HCL PF 1 MG/ML IJ SOLN
INTRAMUSCULAR | Status: AC
  Filled 2012-10-18: qty 1

## 2012-10-18 MED ORDER — MORPHINE SULFATE 10 MG/ML IJ SOLN
INTRAMUSCULAR | Status: DC | PRN
  Administered 2012-10-18: 6 mg via INTRAVENOUS
  Administered 2012-10-18: 4 mg via INTRAVENOUS

## 2012-10-18 MED ORDER — ATENOLOL 50 MG PO TABS
50.0000 mg | ORAL_TABLET | Freq: Every day | ORAL | Status: DC
  Administered 2012-10-18 – 2012-10-27 (×11): 50 mg via ORAL
  Filled 2012-10-18 (×12): qty 1

## 2012-10-18 MED ORDER — HYDROCORTISONE 20 MG PO TABS
20.0000 mg | ORAL_TABLET | Freq: Every day | ORAL | Status: DC
  Administered 2012-10-19: 20 mg via ORAL
  Filled 2012-10-18 (×3): qty 1

## 2012-10-18 MED ORDER — ATORVASTATIN CALCIUM 10 MG PO TABS
10.0000 mg | ORAL_TABLET | Freq: Every day | ORAL | Status: DC
  Administered 2012-10-18 – 2012-10-26 (×8): 10 mg via ORAL
  Filled 2012-10-18 (×10): qty 1

## 2012-10-18 MED ORDER — NEOSTIGMINE METHYLSULFATE 1 MG/ML IJ SOLN
INTRAMUSCULAR | Status: DC | PRN
  Administered 2012-10-18: 3.5 mg via INTRAVENOUS

## 2012-10-18 MED ORDER — METHOCARBAMOL 500 MG PO TABS
500.0000 mg | ORAL_TABLET | Freq: Four times a day (QID) | ORAL | Status: DC | PRN
  Administered 2012-10-18 – 2012-10-27 (×18): 500 mg via ORAL
  Filled 2012-10-18 (×21): qty 1

## 2012-10-18 MED ORDER — PROPOFOL 10 MG/ML IV BOLUS
INTRAVENOUS | Status: DC | PRN
  Administered 2012-10-18: 150 mg via INTRAVENOUS
  Administered 2012-10-18: 50 mg via INTRAVENOUS

## 2012-10-18 MED ORDER — ACETAMINOPHEN 325 MG PO TABS: 650.0000 mg | ORAL_TABLET | ORAL | Status: DC | PRN

## 2012-10-18 MED ORDER — ARTIFICIAL TEARS OP OINT
TOPICAL_OINTMENT | OPHTHALMIC | Status: DC | PRN
  Administered 2012-10-18: 1 via OPHTHALMIC

## 2012-10-18 MED ORDER — GLYCOPYRROLATE 0.2 MG/ML IJ SOLN
INTRAMUSCULAR | Status: DC | PRN
  Administered 2012-10-18: .5 mg via INTRAVENOUS

## 2012-10-18 MED ORDER — BACITRACIN 50000 UNITS IM SOLR
INTRAMUSCULAR | Status: AC
  Filled 2012-10-18: qty 1

## 2012-10-18 SURGICAL SUPPLY — 72 items
ADH SKN CLS APL DERMABOND .7 (GAUZE/BANDAGES/DRESSINGS) ×1
BAG DECANTER FOR FLEXI CONT (MISCELLANEOUS) ×2 IMPLANT
BLADE SURG ROTATE 9660 (MISCELLANEOUS) IMPLANT
BUR MATCHSTICK NEURO 3.0 LAGG (BURR) ×2 IMPLANT
CAGE 13MM (Cage) ×2 IMPLANT
CAGE PLIF 15MM LUMBAR (Cage) ×2 IMPLANT
CANISTER SUCTION 2500CC (MISCELLANEOUS) ×2 IMPLANT
CLOTH BEACON ORANGE TIMEOUT ST (SAFETY) ×2 IMPLANT
CONT SPEC 4OZ CLIKSEAL STRL BL (MISCELLANEOUS) ×4 IMPLANT
COVER BACK TABLE 24X17X13 BIG (DRAPES) IMPLANT
COVER TABLE BACK 60X90 (DRAPES) ×2 IMPLANT
CROSSLINK MEDIUM (Cage) ×1 IMPLANT
DECANTER SPIKE VIAL GLASS SM (MISCELLANEOUS) ×2 IMPLANT
DERMABOND ADVANCED (GAUZE/BANDAGES/DRESSINGS) ×1
DERMABOND ADVANCED .7 DNX12 (GAUZE/BANDAGES/DRESSINGS) ×1 IMPLANT
DRAPE C-ARM 42X72 X-RAY (DRAPES) ×4 IMPLANT
DRAPE LAPAROTOMY 100X72X124 (DRAPES) ×2 IMPLANT
DRAPE POUCH INSTRU U-SHP 10X18 (DRAPES) ×2 IMPLANT
DRAPE PROXIMA HALF (DRAPES) ×1 IMPLANT
DURAPREP 26ML APPLICATOR (WOUND CARE) ×2 IMPLANT
ELECT REM PT RETURN 9FT ADLT (ELECTROSURGICAL) ×2
ELECTRODE REM PT RTRN 9FT ADLT (ELECTROSURGICAL) ×1 IMPLANT
GAUZE SPONGE 4X4 16PLY XRAY LF (GAUZE/BANDAGES/DRESSINGS) ×1 IMPLANT
GLOVE BIOGEL PI IND STRL 7.5 (GLOVE) IMPLANT
GLOVE BIOGEL PI IND STRL 8 (GLOVE) IMPLANT
GLOVE BIOGEL PI IND STRL 8.5 (GLOVE) ×2 IMPLANT
GLOVE BIOGEL PI INDICATOR 7.5 (GLOVE) ×1
GLOVE BIOGEL PI INDICATOR 8 (GLOVE) ×1
GLOVE BIOGEL PI INDICATOR 8.5 (GLOVE) ×2
GLOVE ECLIPSE 7.5 STRL STRAW (GLOVE) ×7 IMPLANT
GLOVE ECLIPSE 8.5 STRL (GLOVE) ×5 IMPLANT
GLOVE EXAM NITRILE LRG STRL (GLOVE) IMPLANT
GLOVE EXAM NITRILE MD LF STRL (GLOVE) IMPLANT
GLOVE EXAM NITRILE XL STR (GLOVE) IMPLANT
GLOVE EXAM NITRILE XS STR PU (GLOVE) IMPLANT
GLOVE INDICATOR 8.0 STRL GRN (GLOVE) ×2 IMPLANT
GLOVE INDICATOR 8.5 STRL (GLOVE) ×1 IMPLANT
GOWN BRE IMP SLV AUR LG STRL (GOWN DISPOSABLE) IMPLANT
GOWN BRE IMP SLV AUR XL STRL (GOWN DISPOSABLE) ×3 IMPLANT
GOWN STRL REIN 2XL LVL4 (GOWN DISPOSABLE) ×4 IMPLANT
KIT BASIN OR (CUSTOM PROCEDURE TRAY) ×2 IMPLANT
KIT ROOM TURNOVER OR (KITS) ×2 IMPLANT
MILL MEDIUM DISP (BLADE) ×1 IMPLANT
NDL SPNL 18GX3.5 QUINCKE PK (NEEDLE) IMPLANT
NEEDLE HYPO 22GX1.5 SAFETY (NEEDLE) ×2 IMPLANT
NEEDLE SPNL 18GX3.5 QUINCKE PK (NEEDLE) IMPLANT
NS IRRIG 1000ML POUR BTL (IV SOLUTION) ×2 IMPLANT
PACK FOAM VITOSS 5CC (Orthopedic Implant) ×1 IMPLANT
PACK LAMINECTOMY NEURO (CUSTOM PROCEDURE TRAY) ×2 IMPLANT
PAD ARMBOARD 7.5X6 YLW CONV (MISCELLANEOUS) ×6 IMPLANT
PATTIES SURGICAL .5 X1 (DISPOSABLE) ×2 IMPLANT
ROD 70MM (Rod) ×2 IMPLANT
ROD SPNL 70X5.5XPRECONTOUR (Rod) IMPLANT
ROD TI 5.5MM 6CM (Rod) ×1 IMPLANT
SCREW POLYAX 6.5X45MM (Screw) ×6 IMPLANT
SCREW SET SPINAL STD HEXALOBE (Screw) ×6 IMPLANT
SPONGE GAUZE 4X4 12PLY (GAUZE/BANDAGES/DRESSINGS) ×1 IMPLANT
SPONGE LAP 4X18 X RAY DECT (DISPOSABLE) IMPLANT
SPONGE SURGIFOAM ABS GEL 100 (HEMOSTASIS) ×2 IMPLANT
STRIP VITOSS 25X100X4MM (Neuro Prosthesis/Implant) ×1 IMPLANT
SUT VIC AB 1 CT1 18XBRD ANBCTR (SUTURE) ×1 IMPLANT
SUT VIC AB 1 CT1 8-18 (SUTURE) ×4
SUT VIC AB 2-0 CP2 18 (SUTURE) ×3 IMPLANT
SUT VIC AB 3-0 SH 8-18 (SUTURE) ×4 IMPLANT
SYR 20ML ECCENTRIC (SYRINGE) ×2 IMPLANT
SYR 3ML LL SCALE MARK (SYRINGE) ×4 IMPLANT
SYR 5ML LL (SYRINGE) IMPLANT
TOWEL OR 17X24 6PK STRL BLUE (TOWEL DISPOSABLE) ×2 IMPLANT
TOWEL OR 17X26 10 PK STRL BLUE (TOWEL DISPOSABLE) ×2 IMPLANT
TRAP SPECIMEN MUCOUS 40CC (MISCELLANEOUS) ×2 IMPLANT
TRAY FOLEY CATH 14FRSI W/METER (CATHETERS) ×2 IMPLANT
WATER STERILE IRR 1000ML POUR (IV SOLUTION) ×2 IMPLANT

## 2012-10-18 NOTE — Consult Note (Signed)
Patient's PCP: Dwana Melena, MD Consulting physician: Dr. Danielle Dess  Reason for the consult: Help manage patient's medical conditions  History of Present Illness: Katie Pace is a 77 y.o. 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 difficile colitis and acute renal failure with now chronic kidney disease stage II/III who presents with the above complaints.  Patient has on long history of back pain which was treated conservatively however she has had spondylitic disease with severe stenosis as a result Dr. Danielle Dess with neurosurgery performed lumbar decompression via laminectomy of L3 and L4, decompression of L3, L4 and L5 nerve roots.  The hospitalist service was consulted to help manage patient's medical conditions.  Patient reports that she has been "feeling bad," when asked to elaborate she indicated that she has been feeling weak and test done by her PCP and vitals were normal.  Denies any recent nausea, vomiting, chest pain, shortness of breath, abdominal pain, diarrhea, headaches or vision changes.  Patient was seen postoperatively, and denied any pain at present.  Review of Systems: All systems reviewed with the patient and positive as per history of present illness, otherwise all other systems are negative.  Past Medical History  Diagnosis Date  . Hypertension   . Hypothyroidism   . Stroke     Ischemic x 2       last one 2007  . Diabetes mellitus     type 11  . Endocrine disturbance     gland has not  worked until 1970.Marland Kitchen..dx in 1977  . Blood transfusion     back in 1973  . GERD (gastroesophageal reflux disease)     on meds to help....meds do well   . Sheehan syndrome 1977  . AAA (abdominal aortic aneurysm)     S/p aortic repair and endarterectomy 12/2011   . Carotid stenosis, left     S/p left CEA 09/2008  . PVD (peripheral  vascular disease)   . C. difficile diarrhea   . Pneumonia     hx  . Heart murmur   . Arthritis   . Anemia     hx   Past Surgical History  Procedure Date  . Spine surgery     Lumbar Diskectomy  . Joint replacement May 2009    Right knee  . Joint replacement April 2008    Right Hip prothesis  . Carotid endarterectomy Dec. 2009  . Cardiac catheterization     1980  &  2009  . Appendectomy     1960  . Cesarean section   . Back surgery     1990's  . Abdominal hysterectomy 1973    1973  . Fracture surgery     left ankle   . Abdominal aortic aneurysm repair 12/09/2011    Procedure: ANEURYSM ABDOMINAL AORTIC REPAIR;  Surgeon: Pryor Ochoa, MD;  Location: [facility] OR;  Service: Vascular;  Laterality: N/A;  resection and grafting abdominal aortic aneurysm; Insertion Bilateral External Iliac Graft; Reexploration Proximal Anastemosis; Aortic Endarterectomy; Reimplantation Inferior Mesenteric Artery; Intraaortic Graft  . Abdominal aortic aneurysm repair    Family History  Problem Relation Age of Onset  . Heart disease Mother   . Peripheral vascular disease Mother   . Alcohol abuse Father   . Heart disease Sister   . Heart disease Brother    History   Social History  . Marital Status: Married    Spouse  Name: N/A    Number of Children: N/A  . Years of Education: N/A   Occupational History  . Not on file.   Social History Main Topics  . Smoking status: Former Smoker -- 0.5 packs/day for 4 years    Types: Cigarettes    Quit date: 10/11/1961  . Smokeless tobacco: Never Used  . Alcohol Use: No  . Drug Use: No  . Sexually Active: Not on file   Other Topics Concern  . Not on file   Social History Narrative  . No narrative on file   Allergies: Codeine; Dilaudid; Lincomycin hcl; Macrodantin; Zolpidem tartrate; Azithromycin; and Penicillins  Home Meds: Prior to Admission medications   Medication Sig Start Date End Date Taking? Authorizing Provider  alendronate (FOSAMAX) 70  MG tablet Take 70 mg by mouth every 7 (seven) days. Takes on Mondays. Take with a full glass of water on an empty stomach.   Yes Historical Provider, MD  amitriptyline (ELAVIL) 25 MG tablet Take 25 mg by mouth at bedtime.   Yes Historical Provider, MD  aspirin EC 81 MG tablet Take 162 mg by mouth at bedtime.   Yes Historical Provider, MD  atenolol (TENORMIN) 50 MG tablet Take 50 mg by mouth daily.   Yes Historical Provider, MD  cholecalciferol (VITAMIN D) 1000 UNITS tablet Take 3,000 Units by mouth daily.   Yes Historical Provider, MD  diltiazem (CARDIZEM CD) 240 MG 24 hr capsule Take 240 mg by mouth daily.   Yes Historical Provider, MD  dipyridamole-aspirin (AGGRENOX) 25-200 MG per 12 hr capsule Take 1 capsule by mouth 2 (two) times daily.   Yes Historical Provider, MD  esomeprazole (NEXIUM) 40 MG capsule Take 40 mg by mouth 2 (two) times daily.    Yes Historical Provider, MD  glucose monitoring kit (FREESTYLE) monitoring kit 1 each by Does not apply route as needed.   Yes Historical Provider, MD  hydrocortisone (CORTEF) 20 MG tablet Take 10-20 mg by mouth daily. Takes 20mg  in the morning and takes 10mg  in the evening.   Yes Historical Provider, MD  levothyroxine (SYNTHROID, LEVOTHROID) 75 MCG tablet Take 75 mcg by mouth daily.   Yes Historical Provider, MD  lisinopril (PRINIVIL,ZESTRIL) 10 MG tablet Take 1 tablet (10 mg total) by mouth daily. 04/04/12 04/04/13 Yes Erick Blinks, MD  pioglitazone (ACTOS) 45 MG tablet Take 45 mg by mouth every morning.    Yes Historical Provider, MD  potassium citrate (UROCIT-K) 10 MEQ (1080 MG) SR tablet Take 20 mEq by mouth every morning.    Yes Historical Provider, MD  rosuvastatin (CRESTOR) 5 MG tablet Take 5 mg by mouth at bedtime.    Yes Historical Provider, MD  Somatropin (HUMATROPE) 5 MG SOLR Inject 0.3 mg into the skin every evening.   Yes Historical Provider, MD  traMADol (ULTRAM) 50 MG tablet Take 100 mg by mouth 3 (three) times daily as needed. For pain.    Yes Historical Provider, MD    Physical Exam: Blood pressure 160/52, pulse 58, temperature 97.6 F (36.4 C), temperature source Oral, resp. rate 19, SpO2 99.00%. General: Awake, Oriented x3, No acute distress. HEENT: EOMI, Moist mucous membranes Neck: Supple CV: S1 and S2 Lungs: Clear to ascultation bilaterally Abdomen: Soft, Nontender, Nondistended, +bowel sounds. Ext: Good pulses. Trace edema. No clubbing or cyanosis noted. Neuro: Cranial Nerves II-XII grossly intact. Has 5/5 motor strength in upper and lower extremities.  Lab results: No results found for this basename: NA:2,K:2,CL:2,CO2:2,GLUCOSE:2,BUN:2,CREATININE:2,CALCIUM:2,MG:2,PHOS:2 in the last 72 hours No  results found for this basename: AST:2,ALT:2,ALKPHOS:2,BILITOT:2,PROT:2,ALBUMIN:2 in the last 72 hours No results found for this basename: LIPASE:2,AMYLASE:2 in the last 72 hours No results found for this basename: WBC:2,NEUTROABS:2,HGB:2,HCT:2,MCV:2,PLT:2 in the last 72 hours No results found for this basename: CKTOTAL:3,CKMB:3,CKMBINDEX:3,TROPONINI:3 in the last 72 hours No components found with this basename: POCBNP:3 No results found for this basename: DDIMER in the last 72 hours No results found for this basename: HGBA1C:2 in the last 72 hours No results found for this basename: CHOL:2,HDL:2,LDLCALC:2,TRIG:2,CHOLHDL:2,LDLDIRECT:2 in the last 72 hours No results found for this basename: TSH,T4TOTAL,FREET3,T3FREE,THYROIDAB in the last 72 hours No results found for this basename: VITAMINB12:2,FOLATE:2,FERRITIN:2,TIBC:2,IRON:2,RETICCTPCT:2 in the last 72 hours Imaging results:  Dg Chest 2 View  10/11/2012  *RADIOLOGY REPORT*  Clinical Data: Preop lumbar fusion.  Hypertension.  History of pneumonia, heart murmur.  CHEST - 2 VIEW  Comparison: 04/01/2012.  Findings: There is hyperinflation of the lungs compatible with COPD.  Tortuosity of the thoracic aorta.  Heart is upper limits normal in size.  Minimal bibasilar scarring.  No  effusions.  No acute bony abnormality.  The mild compression fracture in the mid thoracic spine.  IMPRESSION: COPD/chronic changes.  Mild compression fracture in the mid thoracic spine.   Original Report Authenticated By: Charlett Nose, M.D.    Dg Lumbar Spine 2-3 Views  10/18/2012  *RADIOLOGY REPORT*  Clinical Data: Back pain  DG C-ARM 1-60 MIN,LUMBAR SPINE - 2-3 VIEW  Comparison: Multiple priors  Findings: C-arm films document posterolateral and interbody fusion L3-L5 using interbody cages and pedicle screw and rod fixation.  IMPRESSION: As above.   Original Report Authenticated By: Davonna Belling, M.D.    Dg Lumbar Spine 1 View  10/18/2012  *RADIOLOGY REPORT*  Clinical Data: Back pain  LUMBAR SPINE - 1 VIEW  Comparison: Multiple priors the  Findings: Towel clamps are seen in the spinous processes of L4 and L3.  IMPRESSION: As above   Original Report Authenticated By: Davonna Belling, M.D.    Dg C-arm 1-60 Min  10/18/2012  *RADIOLOGY REPORT*  Clinical Data: Back pain  DG C-ARM 1-60 MIN,LUMBAR SPINE - 2-3 VIEW  Comparison: Multiple priors  Findings: C-arm films document posterolateral and interbody fusion L3-L5 using interbody cages and pedicle screw and rod fixation.  IMPRESSION: As above.   Original Report Authenticated By: Davonna Belling, M.D.    Assessment & Plan by Problem: Spondylolisthesis of lumbar region L3-4, L4,-5 stenosis status post lumbar decompression via laminectomy and decompression of nerve roots Management as per Dr. Danielle Dess, neurosurgery.  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  If patient has any episodes of hypotension consider starting stress dose steroids.  Continue levothyroxin 75 mcg.  Continue somatropin 0.3 milligrams subcutaneous every afternoon.  Diabetes Diabetic diet.  Sensitive sliding scale insulin.  Continue home pioglitazone dose of 45 mg every morning.  Hypertension Continue lisinopril,  atenolol, and diltiazem.  Stable.  GERD Continue PPI.  Hyperlipidemia Continue statin.  Chronic kidney disease stage II/III Creatinine on 10/11/2012 was 1.11.  Renal function over the last year has ranged from 0.91-2.84, suspect baseline creatinine ranges from 1.0 - 1.2.  Continue to monitor.  Prophylaxis As per primary service.  CODE STATUS Full code.  Disposition Pending per primary service.  Thank you for the consult.  Will continue to follow.  Rykker Coviello A, MD 10/18/2012, 4:01 PM

## 2012-10-18 NOTE — H&P (Signed)
Katie Pace is an 77 y.o. female.   Chief Complaint: Back and bilateral leg pain HPI: Patient is a 77 year old right-handed individual who has a significant history of empty sella syndrome and panhypopituitarism. She has had a long-standing history of back pain has been treated conservatively. Recent workups demonstrate that she has advanced spondylitic disease with severe stenosis and a mobile spondylolisthesis at L3-4 L4-5. Having failed efforts at conservative treatment she's been advised regarding surgical decompression and stabilization from L3-L5. She is now admitted for this procedure.  Past Medical History  Diagnosis Date  . Hypertension   . Hypothyroidism   . Stroke     Ischemic x 2       last one 2007  . Diabetes mellitus     type 11  . Endocrine disturbance     gland has not  worked until 1970.Marland Kitchen..dx in 1977  . Blood transfusion     back in 1973  . GERD (gastroesophageal reflux disease)     on meds to help....meds do well   . Sheehan syndrome 1977  . AAA (abdominal aortic aneurysm)     S/p aortic repair and endarterectomy 12/2011   . Carotid stenosis, left     S/p left CEA 09/2008  . PVD (peripheral vascular disease)   . C. difficile diarrhea   . Pneumonia     hx  . Heart murmur   . Arthritis   . Anemia     hx    Past Surgical History  Procedure Date  . Spine surgery     Lumbar Diskectomy  . Joint replacement May 2009    Right knee  . Joint replacement April 2008    Right Hip prothesis  . Carotid endarterectomy Dec. 2009  . Cardiac catheterization     1980  &  2009  . Appendectomy     1960  . Cesarean section   . Back surgery     1990's  . Abdominal hysterectomy 1973    1973  . Fracture surgery     left ankle   . Abdominal aortic aneurysm repair 12/09/2011    Procedure: ANEURYSM ABDOMINAL AORTIC REPAIR;  Surgeon: Pryor Ochoa, MD;  Location: [facility] OR;  Service: Vascular;  Laterality: N/A;  resection and grafting abdominal aortic aneurysm; Insertion  Bilateral External Iliac Graft; Reexploration Proximal Anastemosis; Aortic Endarterectomy; Reimplantation Inferior Mesenteric Artery; Intraaortic Graft  . Abdominal aortic aneurysm repair     Family History  Problem Relation Age of Onset  . Heart disease Mother   . Peripheral vascular disease Mother   . Alcohol abuse Father   . Heart disease Sister   . Heart disease Brother    Social History:  reports that she quit smoking about 51 years ago. Her smoking use included Cigarettes. She has a 2 pack-year smoking history. She has never used smokeless tobacco. She reports that she does not drink alcohol or use illicit drugs.  Allergies:  Allergies  Allergen Reactions  . Codeine Nausea Only  . Dilaudid (Hydromorphone Hcl) Nausea Only  . Lincomycin Hcl Other (See Comments)    Big bumps bilateral legs  . Macrodantin Hives  . Zolpidem Tartrate Other (See Comments)    Stroke  . Azithromycin Rash  . Penicillins Rash    Medications Prior to Admission  Medication Sig Dispense Refill  . alendronate (FOSAMAX) 70 MG tablet Take 70 mg by mouth every 7 (seven) days. Takes on Mondays. Take with a full glass of water on an empty  stomach.      Marland Kitchen amitriptyline (ELAVIL) 25 MG tablet Take 25 mg by mouth at bedtime.      Marland Kitchen aspirin EC 81 MG tablet Take 162 mg by mouth at bedtime.      Marland Kitchen atenolol (TENORMIN) 50 MG tablet Take 50 mg by mouth daily.      . cholecalciferol (VITAMIN D) 1000 UNITS tablet Take 3,000 Units by mouth daily.      Marland Kitchen diltiazem (CARDIZEM CD) 240 MG 24 hr capsule Take 240 mg by mouth daily.      Marland Kitchen dipyridamole-aspirin (AGGRENOX) 25-200 MG per 12 hr capsule Take 1 capsule by mouth 2 (two) times daily.      Marland Kitchen esomeprazole (NEXIUM) 40 MG capsule Take 40 mg by mouth 2 (two) times daily.       Marland Kitchen glucose monitoring kit (FREESTYLE) monitoring kit 1 each by Does not apply route as needed.      . hydrocortisone (CORTEF) 20 MG tablet Take 10-20 mg by mouth daily. Takes 20mg  in the morning and  takes 10mg  in the evening.      Marland Kitchen levothyroxine (SYNTHROID, LEVOTHROID) 75 MCG tablet Take 75 mcg by mouth daily.      Marland Kitchen lisinopril (PRINIVIL,ZESTRIL) 10 MG tablet Take 1 tablet (10 mg total) by mouth daily.      . pioglitazone (ACTOS) 45 MG tablet Take 45 mg by mouth every morning.       . potassium citrate (UROCIT-K) 10 MEQ (1080 MG) SR tablet Take 20 mEq by mouth every morning.       . rosuvastatin (CRESTOR) 5 MG tablet Take 5 mg by mouth at bedtime.       . Somatropin (HUMATROPE) 5 MG SOLR Inject 0.3 mg into the skin every evening.      . traMADol (ULTRAM) 50 MG tablet Take 100 mg by mouth 3 (three) times daily as needed. For pain.        Results for orders placed during the hospital encounter of 10/18/12 (from the past 48 hour(s))  GLUCOSE, CAPILLARY     Status: Normal   Collection Time   10/18/12  6:14 AM      Component Value Range Comment   Glucose-Capillary 96  70 - 99 mg/dL    No results found.  Review of Systems  HENT: Negative.   Eyes: Negative.   Respiratory: Negative.   Cardiovascular: Negative.   Gastrointestinal: Negative.   Musculoskeletal: Positive for myalgias and back pain.  Skin: Negative.   Neurological: Positive for tingling, focal weakness and weakness.  Endo/Heme/Allergies: Negative.   Psychiatric/Behavioral: Negative.     Blood pressure 154/75, pulse 65, temperature 98.5 F (36.9 C), temperature source Oral, resp. rate 20, SpO2 100.00%. Physical Exam  Constitutional: She is oriented to person, place, and time. She appears well-developed and well-nourished.  HENT:  Head: Normocephalic and atraumatic.  Eyes: Conjunctivae normal and EOM are normal. Pupils are equal, round, and reactive to light.  Neck: Normal range of motion. Neck supple.  Cardiovascular: Normal rate and regular rhythm.   Respiratory: Effort normal and breath sounds normal.  GI: Soft. Bowel sounds are normal.  Musculoskeletal:       Marked lower extremity weakness in the iliopsoas  quads tibialis anterior and gastrocs all rated at 4 minus out of 5.  Neurological: She is alert and oriented to person, place, and time. She displays abnormal reflex.       Diminished reflexes in the upper and lower extremities.  Skin: Skin  is warm and dry.  Psychiatric: She has a normal mood and affect. Her behavior is normal. Judgment and thought content normal.     Assessment/Plan   Kellan is admitted for surgery today.  She has had a new MRI of her lumbar spine that I had the opportunity to review.  Hazelynn has advanced spondylosis at several levels.  However, at L2-L3 she does have some moderate degree of stenosis.  The canal is still patent centrally and in the lateral recesses.  The worst levels occur at L3-4 and L4-5 where she has a moderately severe stenosis with little room in the lateral recesses, particularly out on the right side at L3-4, to a lesser extent on the right side at L4-5.  However, the facet hypertrophy is substantial and the central canal stenosis is remarkable.  I demonstrated the findings to her.  More over, the findings of note occur on the plain radiograph with flexion and extension views.  These demonstrate that Devetta has a mobile spondylolisthesis at L3-4 and L4-5.  At L3-4 she has approximately 5 mm. of anterolisthesis.  At L4-5 she has about 7 mm. of anterolisthesis in flexion.  This reduces quite well in extension.  In talking to [facility], her symptoms have only been escalating as time has passed.  I indicated that we could consider a trial of an epidural steroid injection to temporize this, but ultimately I believe that Amarah will require decompression and arthrodesis L3 to L5 with posterior segmental instrumentation from L3 to L5.  This is a substantial undertaking, but given her condition and the nature of her stenosis, I do not believe that more conservative options or nonsurgical options are likely to help at all.  I also do not believe that Amilia is a  candidate for a simple laminectomy as the degenerative spondylolisthesis is quite mobile and this process will need to be stabilized.   10/18/2012, 7:51 AM

## 2012-10-18 NOTE — Progress Notes (Signed)
Report to A. [employer] as lunch relief.

## 2012-10-18 NOTE — Progress Notes (Signed)
Subjective: Patient reports Is no complaints minimal back pain has not ambulated yet  Objective: Vital signs in last 24 hours: Temp:  [97.6 F (36.4 C)-98.5 F (36.9 C)] 97.8 F (36.6 C) (01/14 2150) Pulse Rate:  [50-65] 61  (01/14 2150) Resp:  [13-23] 22  (01/14 2150) BP: (123-186)/(50-75) 171/60 mmHg (01/14 2150) SpO2:  [94 %-100 %] 100 % (01/14 2150) Arterial Line BP: (135-179)/(54-72) 135/54 mmHg (01/14 1337) Weight:  [72.122 kg (159 lb)] 72.122 kg (159 lb) (01/14 2139)  Intake/Output from previous day:   Intake/Output this shift:    Dressing clean and dry motor function intact  Lab Results: No results found for this basename: WBC:2,HGB:2,HCT:2,PLT:2 in the last 72 hours BMET No results found for this basename: NA:2,K:2,CL:2,CO2:2,GLUCOSE:2,BUN:2,CREATININE:2,CALCIUM:2 in the last 72 hours  Studies/Results: Dg Lumbar Spine 2-3 Views  10/18/2012  *RADIOLOGY REPORT*  Clinical Data: Back pain  DG C-ARM 1-60 MIN,LUMBAR SPINE - 2-3 VIEW  Comparison: Multiple priors  Findings: C-arm films document posterolateral and interbody fusion L3-L5 using interbody cages and pedicle screw and rod fixation.  IMPRESSION: As above.   Original Report Authenticated By: Davonna Belling, M.D.    Dg Lumbar Spine 1 View  10/18/2012  *RADIOLOGY REPORT*  Clinical Data: Back pain  LUMBAR SPINE - 1 VIEW  Comparison: Multiple priors the  Findings: Towel clamps are seen in the spinous processes of L4 and L3.  IMPRESSION: As above   Original Report Authenticated By: Davonna Belling, M.D.    Dg C-arm 1-60 Min  10/18/2012  *RADIOLOGY REPORT*  Clinical Data: Back pain  DG C-ARM 1-60 MIN,LUMBAR SPINE - 2-3 VIEW  Comparison: Multiple priors  Findings: C-arm films document posterolateral and interbody fusion L3-L5 using interbody cages and pedicle screw and rod fixation.  IMPRESSION: As above.   Original Report Authenticated By: Davonna Belling, M.D.     Assessment/Plan: Stable postop  LOS: 0 days  Mobilize with PT in  a.m.   Diyari Cherne J 10/18/2012, 10:40 PM

## 2012-10-18 NOTE — Transfer of Care (Signed)
Immediate Anesthesia Transfer of Care Note  Patient: Katie Pace  Procedure(s) Performed: Procedure(s) (LRB) with comments: POSTERIOR LUMBAR FUSION 2 LEVEL (N/A) - Lumbar three-four, four-five Posterior lumbar interbody fusion, Lumbar three-five laminectomy, Peek spacer, Segmental fixation Lumbar three-five   Patient Location: PACU  Anesthesia Type:General  Level of Consciousness: awake, alert  and oriented  Airway & Oxygen Therapy: Patient Spontanous Breathing and Patient connected to nasal cannula oxygen  Post-op Assessment: Report given to PACU RN  Post vital signs: Reviewed and stable  Complications: No apparent anesthesia complications

## 2012-10-18 NOTE — Op Note (Signed)
Preoperative diagnosis: Lumbar spondylosis with radiculopathy L3-4 L4-5, lumbar spondylolisthesis and lumbar stenosis Postoperative diagnosis: Lumbar spondylosis with radiculopathy L3-4 L4-5, lumbar spondylolisthesis and lumbar stenosis Procedure: Lumbar decompression via laminectomy of L3 and L4, decompression of L3-L4 and L5 nerve roots with work greater than for simple interbody arthrodesis L3-4 L4-5, posterior lumbar interbody arthrodesis using peek spacers autograft and allograft at L3-4 L4-5, pedicle screw fixation L3-L5, posterior lateral arthrodesis with autograft and allograft L3-L5  Surgeon: Barnett Abu M.D. Assistant: Sharyon Cable M.D. Anesthesia: Gen. endotracheal Indications: Patient is a 77 year old individual who has had significant progressive back pain and bilateral lower extremity pain with severe radiculopathy and stenosis noted by MRI scans. She is failed efforts at conservative therapy. She is advised regarding surgical decompression and stabilization of L3-L5.  Procedure: She was brought to the operating room. After the smooth induction of general endotracheal anesthesia the patient was carefully turned to the prone position padding the bony prominences appropriately. The back was prepped with alcohol and DuraPrep and draped in a sterile fashion. A midline incision was created and carried down to the lumbar dorsal fascia. Fascia was opened on either side of the midline to expose the spinous processes of the lower lumbar spine. These were identified as L3 and L4 on the radiograph. The dissection was then completed to expose L3-L4 and L5. Laminectomy was then performed removing the entire laminar arch of L4 and 7/8 of the laminar arch of L3 including both facets. The common dural tube was then dissected on the right side there was noted be substantial fibrosis and narrowing causing compression of the exiting L3 nerve root superiorly this was decompressed and carefully dissected to  free the dura on the nerve and the common dural tube. This exposed the disc space. After thorough decompression was obtained here attention was turned to the left-sided L3 nerve root which was decompressed in a similar fashion. The L4 nerve root was next decompressed and on the right side there was noted to be a modest amount of fibrosis adhering the common dural tube and the nerve root. Once this was dissected on the left side there was noted to be a substantial amount of fibrosis and narrowing of the foramen. Once this was adequately decompressed the disc space was exposed here. The L5 nerve root was then similarly decompressed into the foramen and was noted to be somewhat fibrotic from previous surgery that was performed at the L5-S1 level. The stenosis surrounding each of the nerve roots relieved the common dural tube could be mobilized so that a total discectomy can be performed at L3-4 and L4-5.  Once the discectomy was completed the spaces were sized for appropriate size spacer at L3-4 13 mm spacers were fitted. At L4-5 15 mm spacers were fitted. Autologous bone that was harvested from the laminectomy and facetectomy was used as a major component of bone graft in the interspace the interspaces were prepared by curetting away all the endplate material carefully to achieve good bony contact with the vertebral body and the interspace. Spacers were placed at L3-4 along with the autologous bone graft and then at L4-5.  The lateral gutters had been decorticated previously and were packed away these were then packed with the cost bone sponge and both lateral gutters. This was after the transverse processes were decorticated amply from L3-L5.  Pedicle entry sites were then chosen using fluoroscopic guidance 6.5 x 45 mm screws were placed into the each pedicle at L3-L4 and L5. This was done  with fluoroscopic guidance was done with the help of Dr. Gerlene Fee who worked on the ipsilateral side. Once the screws were  placed the lateral gutters were packed with the remainder of any autograft in addition to the cost. Precontoured rods were then fitted to the screw heads and these were tightened to the appropriate torque and tension. A transverse connector was placed between the 2 rods. This was torqued also. Final radiographs confirm good position of the hardware and stable alignment of the spine. Wound was irrigated copiously with antibiotic irrigating solution and then closed with #1 Vicryl in interrupted fashion 20 cc of half percent Marcaine was injected into the paraspinous fascia at the time of closure. Subcutaneous tissues was clipped were closed with 2-0 Vicryl and 3-0 Vicryl is used to close subcuticular skin. Blood loss was estimated at 400 cc. 100 cc of Cell Saver blood was returned to the patient.

## 2012-10-18 NOTE — Anesthesia Preprocedure Evaluation (Addendum)
Anesthesia Evaluation  Patient identified by MRN, date of birth, ID band Patient awake    Reviewed: Allergy & Precautions, H&P , NPO status , Patient's Chart, lab work & pertinent test results, reviewed documented beta blocker date and time   Airway Mallampati: II TM Distance: >3 FB Neck ROM: Full    Dental  (+) Teeth Intact and Dental Advisory Given   Pulmonary former smoker,  breath sounds clear to auscultation  Pulmonary exam normal       Cardiovascular hypertension, Pt. on medications and Pt. on home beta blockers + Peripheral Vascular Disease Rhythm:Regular Rate:Normal     Neuro/Psych TIA   GI/Hepatic GERD-  Medicated and Controlled,  Endo/Other  diabetes, Well Controlled, Type 2Hypothyroidism   Renal/GU Renal InsufficiencyRenal disease     Musculoskeletal   Abdominal Normal abdominal exam  (+) + obese,   Peds  Hematology   Anesthesia Other Findings   Reproductive/Obstetrics                          Anesthesia Physical Anesthesia Plan  ASA: III  Anesthesia Plan: General   Post-op Pain Management:    Induction: Intravenous  Airway Management Planned: Oral ETT  Additional Equipment: Arterial line  Intra-op Plan:   Post-operative Plan: Extubation in OR  Informed Consent: I have reviewed the patients History and Physical, chart, labs and discussed the procedure including the risks, benefits and alternatives for the proposed anesthesia with the patient or authorized representative who has indicated his/her understanding and acceptance.   Dental advisory given  Plan Discussed with: CRNA, Anesthesiologist and Surgeon  Anesthesia Plan Comments: (Lumbar Spinal Stenosis Type 2 DM glucose 96 Htn S/P CVA last 2007 no residual S/P AAA repair 12/09/2011 Low risk stress myoview prior to AAA repair EF 60-65% by ECHO 04/01/12 H/O Panhypopituarism plan perio-op steroid coverage  Kipp Brood, MD)    Anesthesia Quick Evaluation

## 2012-10-18 NOTE — Anesthesia Postprocedure Evaluation (Signed)
  Anesthesia Post-op Note  Patient: Mariaceleste T Caprara  Procedure(s) Performed: Procedure(s) (LRB) with comments: POSTERIOR LUMBAR FUSION 2 LEVEL (N/A) - Lumbar three-four, four-five Posterior lumbar interbody fusion, Lumbar three-five laminectomy, Peek spacer, Segmental fixation Lumbar three-five   Patient Location: PACU  Anesthesia Type:General  Level of Consciousness: awake, alert  and oriented  Airway and Oxygen Therapy: Patient Spontanous Breathing and Patient connected to nasal cannula oxygen  Post-op Pain: mild  Post-op Assessment: Post-op Vital signs reviewed and Patient's Cardiovascular Status Stable  Post-op Vital Signs: stable  Complications: No apparent anesthetic complications

## 2012-10-18 NOTE — Progress Notes (Signed)
CBG was 133 upon adm to PACU

## 2012-10-18 NOTE — Preoperative (Signed)
Beta Blockers   Reason not to administer Beta Blockers:Not Applicable 

## 2012-10-19 DIAGNOSIS — E119 Type 2 diabetes mellitus without complications: Secondary | ICD-10-CM | POA: Diagnosis not present

## 2012-10-19 DIAGNOSIS — I10 Essential (primary) hypertension: Secondary | ICD-10-CM | POA: Diagnosis not present

## 2012-10-19 DIAGNOSIS — D649 Anemia, unspecified: Secondary | ICD-10-CM | POA: Diagnosis not present

## 2012-10-19 DIAGNOSIS — D539 Nutritional anemia, unspecified: Secondary | ICD-10-CM | POA: Diagnosis present

## 2012-10-19 DIAGNOSIS — N182 Chronic kidney disease, stage 2 (mild): Secondary | ICD-10-CM | POA: Diagnosis not present

## 2012-10-19 LAB — GLUCOSE, CAPILLARY
Glucose-Capillary: 143 mg/dL — ABNORMAL HIGH (ref 70–99)
Glucose-Capillary: 135 mg/dL — ABNORMAL HIGH (ref 70–99)
Glucose-Capillary: 156 mg/dL — ABNORMAL HIGH (ref 70–99)

## 2012-10-19 LAB — BASIC METABOLIC PANEL
BUN: 18 mg/dL (ref 6–23)
Creatinine, Ser: 0.9 mg/dL (ref 0.50–1.10)
GFR calc Af Amer: 71 mL/min — ABNORMAL LOW (ref >90)
GFR calc non Af Amer: 61 mL/min — ABNORMAL LOW (ref >90)
Glucose, Bld: 163 mg/dL — ABNORMAL HIGH (ref 70–99)
Potassium: 3.7 mEq/L (ref 3.5–5.1)

## 2012-10-19 LAB — CBC
MCHC: 32.3 g/dL (ref 30.0–36.0)
MCV: 95.9 fL (ref 78.0–100.0)
RBC: 2.91 MIL/uL — ABNORMAL LOW (ref 3.87–5.11)
RDW: 16.3 % — ABNORMAL HIGH (ref 11.5–15.5)
WBC: 13.2 10*3/uL — ABNORMAL HIGH (ref 4.0–10.5)

## 2012-10-19 MED ORDER — DIAZEPAM 5 MG/ML IJ SOLN
2.5000 mg | Freq: Four times a day (QID) | INTRAMUSCULAR | Status: DC | PRN
  Administered 2012-10-19 – 2012-10-21 (×3): 2.5 mg via INTRAVENOUS
  Filled 2012-10-19 (×3): qty 2

## 2012-10-19 MED ORDER — INSULIN ASPART 100 UNIT/ML ~~LOC~~ SOLN
0.0000 [IU] | Freq: Three times a day (TID) | SUBCUTANEOUS | Status: DC
  Administered 2012-10-19: 2 [IU] via SUBCUTANEOUS
  Administered 2012-10-20 (×2): 3 [IU] via SUBCUTANEOUS
  Administered 2012-10-21: 2 [IU] via SUBCUTANEOUS
  Administered 2012-10-21: 3 [IU] via SUBCUTANEOUS
  Administered 2012-10-22 (×2): 2 [IU] via SUBCUTANEOUS
  Administered 2012-10-23: 3 [IU] via SUBCUTANEOUS
  Administered 2012-10-24 – 2012-10-25 (×4): 2 [IU] via SUBCUTANEOUS
  Administered 2012-10-26: 3 [IU] via SUBCUTANEOUS
  Administered 2012-10-26: 5 [IU] via SUBCUTANEOUS
  Administered 2012-10-26: 3 [IU] via SUBCUTANEOUS
  Administered 2012-10-27: 2 [IU] via SUBCUTANEOUS

## 2012-10-19 NOTE — Progress Notes (Signed)
[facility] PROGRESS NOTE  DEMETRIC PARSLOW ZOX:[id number] DOB: [birth year] DOA: 10/18/2012 PCP: Dwana Melena, MD  Assessment/Plan: Principal Problem:  *Spondylolisthesis of lumbar region L3-4 L4-5 with stenosis Active Problems:  Sheehan syndrome  Hypertension  Diabetes mellitus  CKD (chronic kidney disease), stage II    1. Spondylolisthesis of lumbar region L3-4, L4,-5 stenosis: Patient is s/p Lumbar decompression via laminectomy and decompression of nerve roots 10/18/12. Management as per Dr. Danielle Dess, neurosurgery. Clinically, appears to be doing well on POD#1.  2. Sheehan syndrome/panhypopituitarism:   Patient is on replacement therapy with Hydrocortisone/Levothyroxine/Somatropin, and appears stable.  3. Diabetes: This is type 2. Managing with diet/SSI/Actos, with reasonable control.   4. Hypertension: Controlled on Lisinopril, Atenolol, and Diltiazem. 5. GERD: Asymptomatic on PPI.   6. Hyperlipidemia:  Continued on  statin.  7. Chronic kidney disease stage II/III: Creatinine on 10/11/2012 was 1.11, and has over the last year, ranged from 0.91-2.84. Currently at baseline. 8. Anemia: Patient has a mild normocytic anemia of 9.0 today. This may be due to ABLA on chronic anemia. Baseline HB was 11.6 on 10/11/12. This is reasonable. Following CBC.     Code Status: Full Code.  Family Communication:  Disposition Plan: To be determined.    Brief narrative: 77 y.o. 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 difficile colitis and acute renal failure with now chronic kidney disease stage II/III. Patient has on long history of back pain which was treated conservatively however she has had spondylitic disease with severe stenosis, and as a result Dr. Danielle Dess performed lumbar decompression via laminectomy of L3 and L4, decompression of  L3, L4 and L5 nerve roots. The hospitalist service was consulted to help manage patient's medical conditions. Peri-operatively.   Consultants:  [facility]  Procedures:  S/p Lumbar laminectomy of L3 and L4 on 10/18/12.  Antibiotics:  N/A.   HPI/Subjective: No new issues apart from back pain. Managed to ambulate to bathroom with help today.   Objective: Vital signs in last 24 hours: Temp:  [97.6 F (36.4 C)-99.1 F (37.3 C)] 99.1 F (37.3 C) (01/15 8119) Pulse Rate:  [50-64] 59  (01/15 0633) Resp:  [13-23] 20  (01/15 0633) BP: (123-186)/(50-74) 135/54 mmHg (01/15 0633) SpO2:  [94 %-100 %] 98 % (01/15 1478) Arterial Line BP: (135-179)/(54-72) 135/54 mmHg (01/14 1337) Weight:  [72.122 kg (159 lb)] 72.122 kg (159 lb) (01/14 2139) Weight change:     Intake/Output from previous day: 01/14 0701 - 01/15 0700 In: 2485 [P.O.:30; I.V.:2105; Blood:100; IV Piggyback:250] Out: 1030 [Urine:780; Blood:250]     Physical Exam: General: Comfortable, alert, communicative, fully oriented, not short of breath at rest.  HEENT:  Mild clinical pallor, no jaundice, no conjunctival injection or discharge. Hydration is fair.  NECK:  Supple, JVP not seen, no carotid bruits, no palpable lymphadenopathy, no palpable goiter. CHEST:  Clinically clear to auscultation, no wheezes, no crackles. HEART:  Sounds 1 and 2 heard, normal, regular, no murmurs. ABDOMEN:  Moderately obese, soft, non-tender, no palpable organomegaly, no palpable masses, normal bowel sounds. GENITALIA:  Not examined. LOWER EXTREMITIES:  No pitting edema, palpable peripheral pulses. MUSCULOSKELETAL SYSTEM:  Generalized osteoarthritic changes, otherwise, normal. CENTRAL NERVOUS SYSTEM:  No focal neurologic deficit on gross examination.  Lab Results: No results found for this basename: WBC:2,HGB:2,HCT:2,PLT:2 in the last 72 hours No results found for this basename: NA:2,K:2,CL:2,CO2:2,GLUCOSE:2,BUN:2,CREATININE:2,CALCIUM:2 in the last 72  hours Recent Results (from  the past 240 hour(s))  SURGICAL PCR SCREEN     Status: Normal   Collection Time   10/11/12 10:58 AM      Component Value Range Status Comment   MRSA, PCR NEGATIVE  NEGATIVE Final    Staphylococcus aureus NEGATIVE  NEGATIVE Final      Studies/Results: Dg Lumbar Spine 2-3 Views  10/18/2012  *RADIOLOGY REPORT*  Clinical Data: Back pain  DG C-ARM 1-60 MIN,LUMBAR SPINE - 2-3 VIEW  Comparison: Multiple priors  Findings: C-arm films document posterolateral and interbody fusion L3-L5 using interbody cages and pedicle screw and rod fixation.  IMPRESSION: As above.   Original Report Authenticated By: Davonna Belling, M.D.    Dg Lumbar Spine 1 View  10/18/2012  *RADIOLOGY REPORT*  Clinical Data: Back pain  LUMBAR SPINE - 1 VIEW  Comparison: Multiple priors the  Findings: Towel clamps are seen in the spinous processes of L4 and L3.  IMPRESSION: As above   Original Report Authenticated By: Davonna Belling, M.D.    Dg C-arm 1-60 Min  10/18/2012  *RADIOLOGY REPORT*  Clinical Data: Back pain  DG C-ARM 1-60 MIN,LUMBAR SPINE - 2-3 VIEW  Comparison: Multiple priors  Findings: C-arm films document posterolateral and interbody fusion L3-L5 using interbody cages and pedicle screw and rod fixation.  IMPRESSION: As above.   Original Report Authenticated By: Davonna Belling, M.D.     Medications: Scheduled Meds:   . amitriptyline  25 mg Oral QHS  . atenolol  50 mg Oral Daily  . atorvastatin  10 mg Oral q1800  . diltiazem  240 mg Oral Daily  . hydrocortisone  10 mg Oral QHS  . hydrocortisone  20 mg Oral QHS  . insulin aspart  0-5 Units Subcutaneous QHS  . insulin aspart  0-9 Units Subcutaneous TID WC  . levothyroxine  75 mcg Oral Daily  . lisinopril  10 mg Oral Daily  . methylPREDNISolone (SOLU-MEDROL) injection  60 mg Intravenous Q12H  . pantoprazole  80 mg Oral Q1200  . pioglitazone  45 mg Oral QAC breakfast  . potassium citrate  20 mEq Oral q morning - 10a  . sodium chloride  3 mL  Intravenous Q12H  . Somatropin  0.3 mg Subcutaneous QPM   Continuous Infusions:   . sodium chloride 250 mL (10/18/12 1600)  . sodium chloride 100 mL/hr at 10/19/12 0334   PRN Meds:.acetaminophen, acetaminophen, alum & mag hydroxide-simeth, HYDROcodone-acetaminophen, menthol-cetylpyridinium, methocarbamol (ROBAXIN) IV, methocarbamol, morphine injection, ondansetron (ZOFRAN) IV, oxyCODONE-acetaminophen, phenol, sodium chloride    LOS: 1 day   Heaven Wandell,CHRISTOPHER  [facility] Pager [phone]. If 8PM-8AM, please contact night-coverage at [email or web address], password [facility] 10/19/2012, 8:16 AM  LOS: 1 day

## 2012-10-19 NOTE — Progress Notes (Signed)
Pt in room upset and irritable, when asked she states "Nobody telling me anything". When asked if she saw the MD this am she said yes but couldn't really understand all that she was told. Pt was mainly upset about her steroid medications and wanted to know why she was taking them. Pt said she's getting more steroids than she takes at home and wants to know why she's taking this much dose. It was explained to pt she's on steroids d/t her Sheehan syndrome. Pt states she understand why but still feels she is taking too much dose than she normally takes. Pt calmed now and explained to her to speak with the physicians when they come in the am and will also notify the incoming RN in the am and leave a note for the physicians.

## 2012-10-19 NOTE — Clinical Social Work Note (Signed)
Clinical Social Work   CSW received consult for SNF. CSW reviewed chart and discussed pt with during progression. Awaiting PT/OT evals. CSW will assess pt for SNF, if appropriate. Please call with any urgent needs. CSW will continue to follow.   Dede Query, MSW, Theres[facility] Majors [phone]

## 2012-10-19 NOTE — Progress Notes (Signed)
Pt confused, saying she hasn't talked to doctor. Intermittently remember having surgery. Yelling and screaming at staff. MD on call notified (Cabbell). Prn med ordered. Will continue to monitor.

## 2012-10-19 NOTE — Evaluation (Signed)
Occupational Therapy Evaluation Patient Details Name: Katie Pace MRN: [id number] DOB: 08-10-1936 Today's Date: 10/19/2012 Time: 1308-6578 OT Time Calculation (min): 27 min  OT Assessment / Plan / Recommendation Clinical Impression  Pt is recovering from 2 level PLIF.  Pt was using a walker prior to admission and walking short distances in the home.  Pt requires +2 assist for mobility.  Not able to ambulate this visit.  Pt appears anxious with decreased awareness of safety and back precautions.  Recommending SNF for ST rehab prior to return home with husband.    OT Assessment  Patient needs continued OT Services    Follow Up Recommendations  SNF    Barriers to Discharge      Equipment Recommendations  None recommended by OT    Recommendations for Other Services    Frequency  Min 2X/week    Precautions / Restrictions Precautions Precautions: Fall;Back Precaution Booklet Issued: Yes (comment) Precaution Comments: Educated in back precautions.   Pertinent Vitals/Pain Back, 8/10 with movement, no pain with sitting.    ADL  Eating/Feeding: Independent Where Assessed - Eating/Feeding: Chair Grooming: Set up Where Assessed - Grooming: Unsupported sitting Upper Body Bathing: Minimal assistance Where Assessed - Upper Body Bathing: Unsupported sitting Lower Body Bathing: Moderate assistance Where Assessed - Lower Body Bathing: Unsupported sitting;Supported sit to stand Upper Body Dressing: Set up Where Assessed - Upper Body Dressing: Unsupported sitting Lower Body Dressing: Moderate assistance;Set up (can donn socks crossing foot over opposite knee) Where Assessed - Lower Body Dressing: Supported sit to stand;Unsupported sitting Equipment Used: Gait belt;Rolling walker Transfers/Ambulation Related to ADLs: Pt unable to ambulate, stood x 2 only with +2 total 40 %, pt sat abruptly stating she couldn't walk, although she was able to pick her feet up while holding the walker. ADL  Comments: Pt able to cross her foot over her opposite knee.  Unable to stand and release walker for LB bathing and dressing .  Pt appearing quite anxious.  Oriented, but difficulty getting accurate and consistent information from her about her PLOF.    OT Diagnosis: Cognitive deficits;Generalized weakness;Acute pain  OT Problem List:   OT Treatment Interventions: Self-care/ADL training;DME and/or AE instruction;Patient/family education   OT Goals Acute Rehab OT Goals OT Goal Formulation: With patient Time For Goal Achievement: 11/02/12 Potential to Achieve Goals: Good ADL Goals Pt Will Perform Grooming: Standing at sink;with min assist (2 activities) ADL Goal: Grooming - Progress: Goal set today Pt Will Perform Lower Body Bathing: with min assist;Sit to stand from chair ADL Goal: Lower Body Bathing - Progress: Goal set today Pt Will Perform Lower Body Dressing: with min assist;Sit to stand from chair ADL Goal: Lower Body Dressing - Progress: Goal set today Pt Will Transfer to Toilet: with min assist;Ambulation;3-in-1;Maintaining back safety precautions ADL Goal: Toilet Transfer - Progress: Goal set today Pt Will Perform Toileting - Clothing Manipulation: with min assist;Standing ADL Goal: Toileting - Clothing Manipulation - Progress: Goal set today Pt Will Perform Toileting - Hygiene: with min assist;Sit to stand from 3-in-1/toilet ADL Goal: Toileting - Hygiene - Progress: Goal set today Pt Will Perform Tub/Shower Transfer: Tub transfer;Maintaining back safety precautions;Ambulation ADL Goal: Tub/Shower Transfer - Progress: Goal set today Miscellaneous OT Goals Miscellaneous OT Goal #1: Pt will state 3 of 3 back precautions. OT Goal: Miscellaneous Goal #1 - Progress: Goal set today Miscellaneous OT Goal #2: Pt will perform log roll technique for bed mobility with min assist in prep for ADL. OT Goal: Miscellaneous Goal #2 -  Progress: Goal set today  Visit Information  Last OT Received  On: 10/19/12 Assistance Needed: +2 PT/OT Co-Evaluation/Treatment: Yes    Subjective Data  Subjective: "I walk in my house with my walker, probably not as much as I should." Patient Stated Goal: Pt is agreeable to post hospital rehab.   Prior Functioning     Home Living Lives With: Spouse Available Help at Discharge: Family;Available 24 hours/day (daughter helps, lives nearby) Type of Home: House Home Access: Stairs to enter [employer] of Steps: 2 Entrance Stairs-Rails: Can reach both (uses walker on platform steps) Home Layout: Two level;Able to live on main level with bedroom/bathroom (does not go upstairs) Bathroom Shower/Tub: Tub/shower unit;[employer]: Standard Bathroom Accessibility: Yes How Accessible: Accessible via walker Home Adaptive Equipment: Bedside commode/3-in-1;Walker - rolling;Wheelchair - manual;Grab bars in shower;Hospital bed Prior Function Level of Independence: Needs assistance Needs Assistance: Bathing;Meal Prep;Light Housekeeping Bath: Minimal Meal Prep: Total Light Housekeeping: Total Able to Take Stairs?: Yes Driving: No Comments: Pt has difficulty with providing history and home set up. Communication Communication: No difficulties Dominant Hand: Right         Vision/Perception     Cognition  Overall Cognitive Status: Impaired Area of Impairment: Attention;Awareness of errors;Safety/judgement;Memory Arousal/Alertness: Awake/alert Behavior During Session: [facility] for tasks performed Current Attention Level: Alternating Attention - Other Comments: noted to lose her train of train of thought,  distractable Safety/Judgement: Impulsive    Extremity/Trunk Assessment Right Upper Extremity Assessment RUE ROM/Strength/Tone: WFL for tasks assessed RUE Coordination: Deficits (drops items from hands, has arthritic changes) Left Upper Extremity Assessment LUE ROM/Strength/Tone: WFL for tasks assessed LUE Coordination: WFL -  fine motor (drops items, arthritic changes) Trunk Assessment Trunk Assessment: Kyphotic     Mobility Bed Mobility Bed Mobility: Not assessed (pt already up in chair) Transfers Transfers: Sit to Stand;Stand to Sit Sit to Stand: 1: +2 Total assist;With upper extremity assist;From chair/3-in-1 Sit to Stand: Patient Percentage: 40% Stand to Sit: 1: +2 Total assist;To chair/3-in-1;With upper extremity assist Stand to Sit: Patient Percentage: 40% Details for Transfer Assistance: Pt sat abruptly from standing, stood x 2     Shoulder Instructions     Exercise     Balance     End of Session OT - End of Session Activity Tolerance: Patient limited by pain Patient left: in chair;with call bell/phone within reach Nurse Communication: Mobility status  GO     Evern Bio 10/19/2012, 10:53 AM [phone]

## 2012-10-19 NOTE — Progress Notes (Signed)
Physical Therapy Evaluation Note  Past Medical History  Diagnosis Date  . Hypertension   . Hypothyroidism   . Stroke     Ischemic x 2       last one 2007  . Diabetes mellitus     type 11  . Endocrine disturbance     gland has not  worked until 1970.Marland Kitchen..dx in 1977  . Blood transfusion     back in 1973  . GERD (gastroesophageal reflux disease)     on meds to help....meds do well   . Sheehan syndrome 1977  . AAA (abdominal aortic aneurysm)     S/p aortic repair and endarterectomy 12/2011   . Carotid stenosis, left     S/p left CEA 09/2008  . PVD (peripheral vascular disease)   . C. difficile diarrhea   . Pneumonia     hx  . Heart murmur   . Arthritis   . Anemia     hx    Past Surgical History  Procedure Date  . Spine surgery     Lumbar Diskectomy  . Joint replacement May 2009    Right knee  . Joint replacement April 2008    Right Hip prothesis  . Carotid endarterectomy Dec. 2009  . Cardiac catheterization     1980  &  2009  . Appendectomy     1960  . Cesarean section   . Back surgery     1990's  . Abdominal hysterectomy 1973    1973  . Fracture surgery     left ankle   . Abdominal aortic aneurysm repair 12/09/2011    Procedure: ANEURYSM ABDOMINAL AORTIC REPAIR;  Surgeon: Pryor Ochoa, MD;  Location: [facility] OR;  Service: Vascular;  Laterality: N/A;  resection and grafting abdominal aortic aneurysm; Insertion Bilateral External Iliac Graft; Reexploration Proximal Anastemosis; Aortic Endarterectomy; Reimplantation Inferior Mesenteric Artery; Intraaortic Graft  . Abdominal aortic aneurysm repair      10/19/12 0910  PT Visit Information  Last PT Received On 10/19/12  Assistance Needed +2  PT/OT Co-Evaluation/Treatment Yes  PT Time Calculation  PT Start Time 0910  PT Stop Time 0940  PT Time Calculation (min) 30 min  Subjective Data  Subjective Pt received sitting up in chair with report "The RN and nice doctor got me up."  Precautions  Precautions Fall;Back    Precaution Booklet Issued Yes (comment)  Precaution Comments pt with report "I've had many back surgeries." however pt unable to recall them  Restrictions  Weight Bearing Restrictions No  Home Living  Lives With Spouse  Available Help at Discharge Family;Available 24 hours/day (daughter helps, lives nearby)  Type of Home House  Home Access Stairs to enter  Entrance Stairs-Number of Steps 2  Entrance Stairs-Rails Can reach both (uses walker on platform steps)  Home Layout Two level;Able to live on main level with bedroom/bathroom (does not go upstairs)  Bathroom Shower/Tub Tub/shower unit;Curtain  Horticulturist, commercial Yes  How Accessible Accessible via walker  Home Adaptive Equipment Bedside commode/3-in-1;Walker - rolling;Wheelchair - manual;Grab bars in shower;Hospital bed  Additional Comments pt with inconsistent history/PLOF most likely due to deficits from previous strokes  Prior Function  Level of Independence Needs assistance  Needs Assistance Bathing;Meal Prep;Light Housekeeping  Bath Minimal  Meal Prep Total  Light Housekeeping Total  Able to Take Stairs? Yes  Driving No  Comments pt with noted anxiety and cognitive deficts from previous strokes.  Communication  Communication No difficulties  Cognition  Overall Cognitive Status Impaired  Area of Impairment Attention;Awareness of errors;Safety/judgement;Memory  Arousal/Alertness Awake/alert  Orientation Level Oriented X4 / Intact  Behavior During Session [facility] for tasks performed  Current Attention Level Alternating  Attention - Other Comments noted to lose her train of train of thought,  distractable  Safety/Judgement Impulsive  Safety/Judgement - Other Comments limited by anxiety  Awareness of Errors Assistance required to identify errors made  Awareness of Errors - Other Comments pt impulsive sat down without reaching for chair  Right Upper Extremity Assessment  RUE ROM/Strength/Tone  [facility] for tasks assessed  RUE Coordination Deficits (drops items from hands, has arthritic changes)  Left Upper Extremity Assessment  LUE ROM/Strength/Tone WFL for tasks assessed  LUE Coordination WFL - fine motor (drops items, arthritic changes)  Right Lower Extremity Assessment  RLE ROM/Strength/Tone Unable to fully assess ("my legs just give out" appears atleast 3/5)  Left Lower Extremity Assessment  LLE ROM/Strength/Tone Unable to fully assess (atleast 3/5, pt easily distracted)  Trunk Assessment  Trunk Assessment Kyphotic  Bed Mobility  Bed Mobility Not assessed (pt already up in chair)  Transfers  Transfers Sit to Stand;Stand to Sit  Sit to Stand 1: +2 Total assist;With upper extremity assist;From chair/3-in-1  Sit to Stand: Patient Percentage 40%  Stand to Sit 1: +2 Total assist;To chair/3-in-1;With upper extremity assist  Stand to Sit: Patient Percentage 40%  Details for Transfer Assistance Pt sat abruptly from standing, stood x 2  Ambulation/Gait  Ambulation/Gait Assistance 2: Max assist  Ambulation Distance (Feet) (only able to march in place)  Assistive device Rolling walker  Ambulation/Gait Assistance Details pt upon standing with excruciating pain and anxiety inhibiting pt ability to attempt walking or listen to v/c's of PT/OT  Stairs No  PT - End of Session  Equipment Utilized During Treatment Gait belt  Activity Tolerance Patient limited by pain  Patient left in chair;with call bell/phone within reach  Nurse Communication Mobility status  PT Assessment  Clinical Impression Statement Pt 77 yo female s/p PLOF with mulitple co-morbidities. Patient's anxiety, tangental speech, and cognitive deficits from previous strokes require pt to have 24/7 assist with all mobility and adls at this time. Pt to strongly benefit from ST-SNF placement upon d/c to achieve supervision function to decrease burden on spouse at home.  PT Recommendation/Assessment Patient needs continued PT  services  PT Problem List Decreased strength;Decreased activity tolerance;Decreased balance;Decreased coordination;Decreased knowledge of use of DME;Decreased mobility;Decreased knowledge of precautions  PT Therapy Diagnosis  Difficulty walking;Acute pain  PT Plan  PT Frequency Min 4X/week  PT Treatment/Interventions DME instruction;Gait training;Stair training;Functional mobility training;Therapeutic activities;Therapeutic exercise;Balance training  PT Recommendation  Follow Up Recommendations SNF;Supervision/Assistance - 24 hour  PT equipment None recommended by PT  Individuals Consulted  Consulted and Agree with Results and Recommendations Patient  Acute Rehab PT Goals  PT Goal Formulation With patient  Time For Goal Achievement 10/26/12  Potential to Achieve Goals Fair  Pt will Roll Supine to Right Side with supervision;with rail  PT Goal: Rolling Supine to Right Side - Progress Goal set today  Pt will go Supine/Side to Sit with min assist;with HOB 0 degrees;with rail  PT Goal: Supine/Side to Sit - Progress Goal set today  Pt will go Sit to Stand with min assist;with upper extremity assist (up to RW)  PT Goal: Sit to Stand - Progress Goal set today  Pt will Transfer Bed to Chair/Chair to Bed with min assist (with RW)  PT Transfer Goal: Bed to Chair/Chair  to Bed - Progress Goal set today  Pt will Ambulate 16 - 50 feet;with min assist;with rolling walker  PT Goal: Ambulate - Progress Goal set today  Additional Goals  Additional Goal #1 Pt able to recall 3/3 back precautions independently and 100% compliant  PT Goal: Additional Goal #1 - Progress Goal set today  PT General Charges  $$ ACUTE PT VISIT 1 Procedure  PT Evaluation  $Initial PT Evaluation Tier I 1 Procedure  PT Treatments  $Therapeutic Activity 8-22 mins  Written Expression  Dominant Hand Right     Pain: 10/10 with WBing, 2-3/10 back pain in sitting  Lewis Shock, PT, DPT Pager #: [phone] Office #:  [phone]

## 2012-10-19 NOTE — Progress Notes (Signed)
Subjective: Patient reports back pain, legs weak  Objective: Vital signs in last 24 hours: Temp:  [97.7 F (36.5 C)-99.1 F (37.3 C)] 97.9 F (36.6 C) (01/15 1409) Pulse Rate:  [56-61] 59  (01/15 1409) Resp:  [18-22] 18  (01/15 1409) BP: (121-171)/(42-60) 123/42 mmHg (01/15 1409) SpO2:  [91 %-100 %] 98 % (01/15 1409) Weight:  [72.122 kg (159 lb)] 72.122 kg (159 lb) (01/14 2139)  Intake/Output from previous day: 01/14 0701 - 01/15 0700 In: 2485 [P.O.:30; I.V.:2105; Blood:100; IV Piggyback:250] Out: 1030 [Urine:780; Blood:250] Intake/Output this shift:    incision clean and dry motor fn 4/5  Lab Results:  [facility] 10/19/12 0733  WBC 13.2*  HGB 9.0*  HCT 27.9*  PLT 143*   BMET  Basename 10/19/12 0733  NA 141  K 3.7  CL 103  CO2 28  GLUCOSE 163*  BUN 18  CREATININE 0.90  CALCIUM 8.3*    Studies/Results: Dg Lumbar Spine 2-3 Views  10/18/2012  *RADIOLOGY REPORT*  Clinical Data: Back pain  DG C-ARM 1-60 MIN,LUMBAR SPINE - 2-3 VIEW  Comparison: Multiple priors  Findings: C-arm films document posterolateral and interbody fusion L3-L5 using interbody cages and pedicle screw and rod fixation.  IMPRESSION: As above.   Original Report Authenticated By: Davonna Belling, M.D.    Dg Lumbar Spine 1 View  10/18/2012  *RADIOLOGY REPORT*  Clinical Data: Back pain  LUMBAR SPINE - 1 VIEW  Comparison: Multiple priors the  Findings: Towel clamps are seen in the spinous processes of L4 and L3.  IMPRESSION: As above   Original Report Authenticated By: Davonna Belling, M.D.    Dg C-arm 1-60 Min  10/18/2012  *RADIOLOGY REPORT*  Clinical Data: Back pain  DG C-ARM 1-60 MIN,LUMBAR SPINE - 2-3 VIEW  Comparison: Multiple priors  Findings: C-arm films document posterolateral and interbody fusion L3-L5 using interbody cages and pedicle screw and rod fixation.  IMPRESSION: As above.   Original Report Authenticated By: Davonna Belling, M.D.     Assessment/Plan: Stable po day 1  LOS: 1 day  mobilize as  tolerated.    Anesa Fronek J 10/19/2012, 5:43 PM

## 2012-10-20 DIAGNOSIS — E230 Hypopituitarism: Secondary | ICD-10-CM | POA: Diagnosis not present

## 2012-10-20 DIAGNOSIS — M4310 Spondylolisthesis, site unspecified: Principal | ICD-10-CM

## 2012-10-20 DIAGNOSIS — D649 Anemia, unspecified: Secondary | ICD-10-CM | POA: Diagnosis not present

## 2012-10-20 DIAGNOSIS — N182 Chronic kidney disease, stage 2 (mild): Secondary | ICD-10-CM | POA: Diagnosis not present

## 2012-10-20 LAB — GLUCOSE, CAPILLARY
Glucose-Capillary: 144 mg/dL — ABNORMAL HIGH (ref 70–99)
Glucose-Capillary: 158 mg/dL — ABNORMAL HIGH (ref 70–99)
Glucose-Capillary: 197 mg/dL — ABNORMAL HIGH (ref 70–99)
Glucose-Capillary: 222 mg/dL — ABNORMAL HIGH (ref 70–99)

## 2012-10-20 MED ORDER — HYDROCORTISONE 20 MG PO TABS
20.0000 mg | ORAL_TABLET | Freq: Every day | ORAL | Status: DC
  Administered 2012-10-21 – 2012-10-27 (×7): 20 mg via ORAL
  Filled 2012-10-20 (×7): qty 1

## 2012-10-20 MED FILL — Sodium Chloride IV Soln 0.9%: INTRAVENOUS | Qty: 1000 | Status: AC

## 2012-10-20 MED FILL — Heparin Sodium (Porcine) Inj 1000 Unit/ML: INTRAMUSCULAR | Qty: 30 | Status: AC

## 2012-10-20 MED FILL — Sodium Chloride Irrigation Soln 0.9%: Qty: 3000 | Status: AC

## 2012-10-20 NOTE — Progress Notes (Signed)
Orthopedic Tech Progress Note Patient Details:  Katie Pace [birth year] [id number] Brace order completed by [facility]. Patient ID: Katie Pace, female   DOB: Jul 26, 1936, 77 y.o.   MRN: [id number]   [name] 10/20/2012, 5:14 PM

## 2012-10-20 NOTE — Progress Notes (Signed)
Physical Therapy Treatment Patient Details Name: Katie Pace MRN: [id number] DOB: [birth year] Today's Date: 10/20/2012 Time: 6962-9528 PT Time Calculation (min): 15 min  PT Assessment / Plan / Recommendation Comments on Treatment Session  Pt cont's with limited mobilty at this date.  C/o increased pain with activity & pt also becomes anxious with OOB activity.  At this time,  this clinician stronly recommends SNF prior to d/c home.      Follow Up Recommendations  SNF;Supervision/Assistance - 24 hour     Does the patient have the potential to tolerate intense rehabilitation     Barriers to Discharge        Equipment Recommendations  None recommended by PT    Recommendations for Other Services    Frequency Min 4X/week   Plan Discharge plan remains appropriate    Precautions / Restrictions Precautions Precautions: Fall;Back Precaution Comments: Pt unable to recall any of the 3 back precautions.  Re-educated on all 3.   Restrictions Weight Bearing Restrictions: No   Pertinent Vitals/Pain 3/10 back at rest.  6/10 back with activity.  "It's just a horrible pain when I move".  RN notified.     Mobility  Bed Mobility Bed Mobility: Rolling Right;Right Sidelying to Sit;Sitting - Scoot to Edge of Bed Rolling Right: 4: Min guard;With rail Right Sidelying to Sit: 4: Min guard;HOB flat;With rails Sitting - Scoot to Edge of Bed: 4: Min guard Details for Bed Mobility Assistance: Increased time due to pain per pt.  Cues for sequencing & technique.   Transfers Transfers: Sit to Stand;Stand to Sit;Stand Pivot Transfers Sit to Stand: 1: +2 Total assist;With upper extremity assist;From bed Sit to Stand: Patient Percentage: 40% Stand to Sit: 1: +2 Total assist;With upper extremity assist;To bed;To chair/3-in-1;With armrests Stand to Sit: Patient Percentage: 40% Stand Pivot Transfers: 1: +2 Total assist Stand Pivot Transfers: Patient Percentage: 40% Details for Transfer Assistance: Pt  with severely flexed posture & bil knee flexion.  Increased anxiety.  Max directional cues for safety & technique.  Pt sitting back on bed without warning with 1st trial of sit<>stand.  During SPT, pt required (A) to rotate bed to recliner & manage RW.   Ambulation/Gait Ambulation/Gait Assistance: Not tested (comment) (due to pain & anxiety.  ) Stairs: No      PT Goals Acute Rehab PT Goals Time For Goal Achievement: 10/26/12 Potential to Achieve Goals: Fair Pt will Roll Supine to Right Side: with supervision;with rail PT Goal: Rolling Supine to Right Side - Progress: Progressing toward goal Pt will go Supine/Side to Sit: with min assist;with HOB 0 degrees;with rail PT Goal: Supine/Side to Sit - Progress: Progressing toward goal Pt will go Sit to Stand: with min assist;with upper extremity assist PT Goal: Sit to Stand - Progress: Not met Pt will Transfer Bed to Chair/Chair to Bed: with min assist PT Transfer Goal: Bed to Chair/Chair to Bed - Progress: Progressing toward goal Pt will Ambulate: 16 - 50 feet;with min assist;with rolling walker Additional Goals Additional Goal #1: Pt able to recall 3/3 back precautions independently and 100% compliant PT Goal: Additional Goal #1 - Progress: Not progressing  Visit Information  Last PT Received On: 10/20/12 Assistance Needed: +2    Subjective Data      Cognition  Overall Cognitive Status: Impaired Area of Impairment: Following commands;Awareness of errors;Safety/judgement Arousal/Alertness: Awake/alert Orientation Level: Oriented X4 / Intact Behavior During Session: WFL for tasks performed (becomes anxious with OOB activity.  ) Safety/Judgement - Other Comments: limited  by anxiety Awareness of Errors: Assistance required to identify errors made Awareness of Errors - Other Comments: pt impulsive sat down without reaching for chair Cognition - Other Comments: Pt becomes anxious with sit<>stand & bed>recliner transfer.   Requires cues  for safe technique, however pt not following cues.      Balance     End of Session PT - End of Session Equipment Utilized During Treatment: Gait belt Activity Tolerance: Patient limited by pain;Other (comment) (anxiety with sit<>stand) Patient left: in chair;with call bell/phone within reach Nurse Communication: Mobility status;Patient requests pain meds     Verdell Face, [place] [phone] 10/20/2012

## 2012-10-20 NOTE — Progress Notes (Addendum)
[facility] PROGRESS NOTE  Katie Pace:[id number] DOB: 1936/05/30 DOA: 10/18/2012 PCP: Dwana Melena, MD  Assessment/Plan: Principal Problem:  *Spondylolisthesis of lumbar region L3-4 L4-5 with stenosis Active Problems:  Sheehan syndrome  Hypertension  Diabetes mellitus  CKD (chronic kidney disease), stage II  Anemia    1. Spondylolisthesis of lumbar region L3-4, L4,-5 stenosis: Patient is s/p Lumbar decompression via laminectomy and decompression of nerve roots 10/18/12. Management as per Dr. Danielle Dess, neurosurgery. Clinically, appears to be doing well on POD#2. SNF rehab is planned.  2. Sheehan syndrome/panhypopituitarism:   Patient is on replacement therapy with Hydrocortisone/Levothyroxine/Somatropin, and appears stable. She was managed briefly with stress doses of steroids, but has been changed to pre-admission dose, today.  3. Diabetes: This is type 2. Managing with diet/SSI/Actos, with reasonable control.   4. Hypertension: Controlled on Lisinopril, Atenolol, and Diltiazem. 5. GERD: Asymptomatic on PPI.   6. Hyperlipidemia:  Continued on  statin.  7. Chronic kidney disease stage II/III: Creatinine on 10/11/2012 was 1.11, and has over the last year, ranged from 0.91-2.84. Currently at baseline. 8. Anemia: Patient has a mild normocytic anemia of 9.0 as of 10/19/12. This may be due to ABLA on chronic anemia. Baseline HB was 11.6 on 10/11/12. This is reasonable. Following CBC.     Code Status: Full Code.  Family Communication:  Disposition Plan: To be determined.    Brief narrative: 77 y.o. 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 difficile colitis and acute renal failure with now chronic kidney disease stage II/III. Patient has on long history of back pain which was treated conservatively however she has had  spondylitic disease with severe stenosis, and as a result Dr. Danielle Dess performed lumbar decompression via laminectomy of L3 and L4, decompression of L3, L4 and L5 nerve roots. The hospitalist service was consulted to help manage patient's medical conditions. Peri-operatively.   Consultants:  [facility]  Procedures:  S/p Lumbar laminectomy of L3 and L4 on 10/18/12.  Antibiotics:  N/A.   HPI/Subjective: Katie Pace, feels much better pain-wise.   Objective: Vital signs in last 24 hours: Temp:  [97.6 F (36.4 C)-98.6 F (37 C)] 98.6 F (37 C) (01/16 0955) Pulse Rate:  [62-69] 68  (01/16 0955) Resp:  [18-20] 20  (01/16 0955) BP: (136-153)/(47-57) 136/47 mmHg (01/16 0955) SpO2:  [93 %-100 %] 100 % (01/16 0955) Weight change:  Last BM Date: 10/17/12  Intake/Output from previous day:   Total I/O In: 740 [P.O.:740] Out: -    Physical Exam: General: Comfortable in chair, alert, communicative, fully oriented, not short of breath at rest.  HEENT:  Mild clinical pallor, no jaundice, no conjunctival injection or discharge. Hydration is fair.  NECK:  Supple, JVP not seen, no carotid bruits, no palpable lymphadenopathy, no palpable goiter. CHEST:  Clinically clear to auscultation, no wheezes, no crackles. HEART:  Sounds 1 and 2 heard, normal, regular, no murmurs. ABDOMEN:  Moderately obese, soft, non-tender, no palpable organomegaly, no palpable masses, normal bowel sounds. GENITALIA:  Not examined. LOWER EXTREMITIES:  No pitting edema, palpable peripheral pulses. MUSCULOSKELETAL SYSTEM:  Generalized osteoarthritic changes, otherwise, normal. CENTRAL NERVOUS SYSTEM:  No focal neurologic deficit on gross examination.  Lab Results:  [facility] 10/19/12 0733  WBC 13.2*  HGB 9.0*  HCT 27.9*  PLT 143*    Basename 10/19/12 0733  NA 141  K 3.7  CL 103  CO2 28  GLUCOSE 163*  BUN 18  CREATININE 0.90  CALCIUM 8.3*   Recent Results (from the past 240 hour(s))  SURGICAL PCR SCREEN      Status: Normal   Collection Time   10/11/12 10:58 AM      Component Value Range Status Comment   MRSA, PCR NEGATIVE  NEGATIVE Final    Staphylococcus aureus NEGATIVE  NEGATIVE Final      Studies/Results: No results found.  Medications: Scheduled Meds:    . amitriptyline  25 mg Oral QHS  . atenolol  50 mg Oral Daily  . atorvastatin  10 mg Oral q1800  . diltiazem  240 mg Oral Daily  . hydrocortisone  10 mg Oral QHS  . hydrocortisone  20 mg Oral QHS  . insulin aspart  0-15 Units Subcutaneous TID WC  . insulin aspart  0-5 Units Subcutaneous QHS  . levothyroxine  75 mcg Oral Daily  . lisinopril  10 mg Oral Daily  . methylPREDNISolone (SOLU-MEDROL) injection  60 mg Intravenous Q12H  . pantoprazole  80 mg Oral Q1200  . pioglitazone  45 mg Oral QAC breakfast  . potassium citrate  20 mEq Oral q morning - 10a  . sodium chloride  3 mL Intravenous Q12H  . Somatropin  0.3 mg Subcutaneous QPM   Continuous Infusions:    . sodium chloride 250 mL (10/18/12 1600)  . sodium chloride 100 mL/hr at 10/19/12 0334   PRN Meds:.acetaminophen, acetaminophen, alum & mag hydroxide-simeth, diazepam, HYDROcodone-acetaminophen, menthol-cetylpyridinium, methocarbamol (ROBAXIN) IV, methocarbamol, morphine injection, ondansetron (ZOFRAN) IV, oxyCODONE-acetaminophen, phenol, sodium chloride    LOS: 2 days   Winnie Barsky,CHRISTOPHER  [facility] Pager [phone]. If 8PM-8AM, please contact night-coverage at [email or web address], password [facility] 10/20/2012, 2:12 PM  LOS: 2 days

## 2012-10-20 NOTE — Progress Notes (Deleted)
SCDS not on per pt request

## 2012-10-20 NOTE — Progress Notes (Signed)
Patient ID: Katie Pace, female   DOB: [birth year], 77 y.o.   MRN: [id number] Subjective: Patient reports incisional pain  Objective: Vital signs in last 24 hours: Temp:  [97.6 F (36.4 C)-98.6 F (37 C)] 98.3 F (36.8 C) (01/16 1418) Pulse Rate:  [62-70] 70  (01/16 1418) Resp:  [18-20] 20  (01/16 1418) BP: (136-153)/(47-57) 139/51 mmHg (01/16 1418) SpO2:  [93 %-100 %] 100 % (01/16 1418)  Intake/Output from previous day:   Intake/Output this shift: Total I/O In: 740 [P.O.:740] Out: -   Wound:clean and dry  Lab Results:  [facility] 10/19/12 0733  WBC 13.2*  HGB 9.0*  HCT 27.9*  PLT 143*   BMET  Basename 10/19/12 0733  NA 141  K 3.7  CL 103  CO2 28  GLUCOSE 163*  BUN 18  CREATININE 0.90  CALCIUM 8.3*    Studies/Results: No results found.  Assessment/Plan: Doing fairly well. Slow to increase activity. Will get PT to work with her.  LOS: 2 days  as above   Reinaldo Meeker, MD 10/20/2012, 3:34 PM

## 2012-10-21 DIAGNOSIS — IMO0002 Reserved for concepts with insufficient information to code with codable children: Secondary | ICD-10-CM

## 2012-10-21 DIAGNOSIS — E119 Type 2 diabetes mellitus without complications: Secondary | ICD-10-CM | POA: Diagnosis not present

## 2012-10-21 DIAGNOSIS — E230 Hypopituitarism: Secondary | ICD-10-CM | POA: Diagnosis not present

## 2012-10-21 DIAGNOSIS — Q762 Congenital spondylolisthesis: Secondary | ICD-10-CM

## 2012-10-21 DIAGNOSIS — M47817 Spondylosis without myelopathy or radiculopathy, lumbosacral region: Secondary | ICD-10-CM

## 2012-10-21 DIAGNOSIS — N182 Chronic kidney disease, stage 2 (mild): Secondary | ICD-10-CM | POA: Diagnosis not present

## 2012-10-21 DIAGNOSIS — I10 Essential (primary) hypertension: Secondary | ICD-10-CM | POA: Diagnosis not present

## 2012-10-21 LAB — BASIC METABOLIC PANEL
Chloride: 96 mEq/L (ref 96–112)
GFR calc Af Amer: 57 mL/min — ABNORMAL LOW (ref >90)
GFR calc non Af Amer: 49 mL/min — ABNORMAL LOW (ref >90)
Potassium: 3.7 mEq/L (ref 3.5–5.1)
Sodium: 132 mEq/L — ABNORMAL LOW (ref 135–145)

## 2012-10-21 LAB — GLUCOSE, CAPILLARY
Glucose-Capillary: 181 mg/dL — ABNORMAL HIGH (ref 70–99)
Glucose-Capillary: 139 mg/dL — ABNORMAL HIGH (ref 70–99)
Glucose-Capillary: 111 mg/dL — ABNORMAL HIGH (ref 70–99)

## 2012-10-21 LAB — CBC
MCHC: 32.4 g/dL (ref 30.0–36.0)
Platelets: 150 10*3/uL (ref 150–400)
RDW: 16.8 % — ABNORMAL HIGH (ref 11.5–15.5)
WBC: 12.8 10*3/uL — ABNORMAL HIGH (ref 4.0–10.5)

## 2012-10-21 MED ORDER — LISINOPRIL 20 MG PO TABS
20.0000 mg | ORAL_TABLET | Freq: Every day | ORAL | Status: DC
  Administered 2012-10-22 – 2012-10-27 (×7): 20 mg via ORAL
  Filled 2012-10-21 (×7): qty 1

## 2012-10-21 MED ORDER — BISACODYL 10 MG RE SUPP
10.0000 mg | Freq: Once | RECTAL | Status: AC
  Administered 2012-10-21: 10 mg via RECTAL
  Filled 2012-10-21: qty 1

## 2012-10-21 MED ORDER — POLYETHYLENE GLYCOL 3350 17 G PO PACK
17.0000 g | PACK | Freq: Every day | ORAL | Status: DC
  Administered 2012-10-22 – 2012-10-27 (×3): 17 g via ORAL
  Filled 2012-10-21 (×6): qty 1

## 2012-10-21 NOTE — Progress Notes (Signed)
Katie Pace, PTA [phone] 10/21/2012  

## 2012-10-21 NOTE — Consult Note (Signed)
Physical Medicine and Rehabilitation Consult Reason for Consult: Lumbar spondylosis with radiculopathy Referring Physician: Dr. Danielle Dess   HPI: Katie Pace is a 77 y.o. right-handed female with history of panhypopituitarism, hypertension, CVA in 2007 as well as multiple joint replacements. Patient independent with walker prior to admission but limited. Admitted 10/18/2012 with low back pain radiating to the lower extremities. Recent workup demonstrated advanced spondylitic disease with severe stenosis and radiculopathy lumbar L3-4, L4-5 and no change with conservative care. Underwent lumbar decompression/laminectomy with posterior lumbar interbody arthrodesis pedicle screw fixation 10/18/2012 per Dr. Danielle Dess. Routine back precautions as directed. Postoperative pain management. Physical occupational therapy evaluations completed an ongoing. M.D. is requested physical medicine rehabilitation consult to consider inpatient rehabilitation services   Review of Systems  Gastrointestinal:       Reflux  Musculoskeletal: Positive for myalgias, back pain and joint pain.  Neurological: Positive for weakness.  All other systems reviewed and are negative.   Past Medical History  Diagnosis Date  . Hypertension   . Hypothyroidism   . Stroke     Ischemic x 2       last one 2007  . Diabetes mellitus     type 11  . Endocrine disturbance     gland has not  worked until 1970.Marland Kitchen..dx in 1977  . Blood transfusion     back in 1973  . GERD (gastroesophageal reflux disease)     on meds to help....meds do well   . Sheehan syndrome 1977  . AAA (abdominal aortic aneurysm)     S/p aortic repair and endarterectomy 12/2011   . Carotid stenosis, left     S/p left CEA 09/2008  . PVD (peripheral vascular disease)   . C. difficile diarrhea   . Pneumonia     hx  . Heart murmur   . Arthritis   . Anemia     hx   Past Surgical History  Procedure Date  . Spine surgery     Lumbar Diskectomy  . Joint replacement  May 2009    Right knee  . Joint replacement April 2008    Right Hip prothesis  . Carotid endarterectomy Dec. 2009  . Cardiac catheterization     1980  &  2009  . Appendectomy     1960  . Cesarean section   . Back surgery     1990's  . Abdominal hysterectomy 1973    1973  . Fracture surgery     left ankle   . Abdominal aortic aneurysm repair 12/09/2011    Procedure: ANEURYSM ABDOMINAL AORTIC REPAIR;  Surgeon: Pryor Ochoa, MD;  Location: [facility] OR;  Service: Vascular;  Laterality: N/A;  resection and grafting abdominal aortic aneurysm; Insertion Bilateral External Iliac Graft; Reexploration Proximal Anastemosis; Aortic Endarterectomy; Reimplantation Inferior Mesenteric Artery; Intraaortic Graft  . Abdominal aortic aneurysm repair    Family History  Problem Relation Age of Onset  . Heart disease Mother   . Peripheral vascular disease Mother   . Alcohol abuse Father   . Heart disease Sister   . Heart disease Brother    Social History:  reports that she quit smoking about 51 years ago. Her smoking use included Cigarettes. She has a 2 pack-year smoking history. She has never used smokeless tobacco. She reports that she does not drink alcohol or use illicit drugs. Allergies:  Allergies  Allergen Reactions  . Codeine Nausea Only  . Dilaudid (Hydromorphone Hcl) Nausea Only  . Lincomycin Hcl Other (See Comments)  Big bumps bilateral legs  . Macrodantin Hives  . Zolpidem Tartrate Other (See Comments)    Stroke  . Azithromycin Rash  . Penicillins Rash   Medications Prior to Admission  Medication Sig Dispense Refill  . alendronate (FOSAMAX) 70 MG tablet Take 70 mg by mouth every 7 (seven) days. Takes on Mondays. Take with a full glass of water on an empty stomach.      Marland Kitchen amitriptyline (ELAVIL) 25 MG tablet Take 25 mg by mouth at bedtime.      Marland Kitchen aspirin EC 81 MG tablet Take 162 mg by mouth at bedtime.      Marland Kitchen atenolol (TENORMIN) 50 MG tablet Take 50 mg by mouth daily.      .  cholecalciferol (VITAMIN D) 1000 UNITS tablet Take 3,000 Units by mouth daily.      Marland Kitchen diltiazem (CARDIZEM CD) 240 MG 24 hr capsule Take 240 mg by mouth daily.      Marland Kitchen dipyridamole-aspirin (AGGRENOX) 25-200 MG per 12 hr capsule Take 1 capsule by mouth 2 (two) times daily.      Marland Kitchen esomeprazole (NEXIUM) 40 MG capsule Take 40 mg by mouth 2 (two) times daily.       Marland Kitchen glucose monitoring kit (FREESTYLE) monitoring kit 1 each by Does not apply route as needed.      . hydrocortisone (CORTEF) 20 MG tablet Take 10-20 mg by mouth daily. Takes 20mg  in the morning and takes 10mg  in the evening.      Marland Kitchen levothyroxine (SYNTHROID, LEVOTHROID) 75 MCG tablet Take 75 mcg by mouth daily.      Marland Kitchen lisinopril (PRINIVIL,ZESTRIL) 10 MG tablet Take 1 tablet (10 mg total) by mouth daily.      . pioglitazone (ACTOS) 45 MG tablet Take 45 mg by mouth every morning.       . potassium citrate (UROCIT-K) 10 MEQ (1080 MG) SR tablet Take 20 mEq by mouth every morning.       . rosuvastatin (CRESTOR) 5 MG tablet Take 5 mg by mouth at bedtime.       . Somatropin (HUMATROPE) 5 MG SOLR Inject 0.3 mg into the skin every evening.      . traMADol (ULTRAM) 50 MG tablet Take 100 mg by mouth 3 (three) times daily as needed. For pain.        Home: Home Living Lives With: Spouse Available Help at Discharge: Family;Available 24 hours/day (daughter helps, lives nearby) Type of Home: House Home Access: Stairs to enter [employer] of Steps: 2 Entrance Stairs-Rails: Can reach both (uses walker on platform steps) Home Layout: Two level;Able to live on main level with bedroom/bathroom (does not go upstairs) Bathroom Shower/Tub: Tub/shower unit;[employer]: Standard Bathroom Accessibility: Yes How Accessible: Accessible via walker Home Adaptive Equipment: Bedside commode/3-in-1;Walker - rolling;Wheelchair - manual;Grab bars in shower;Hospital bed Additional Comments: pt with inconsistent history/PLOF most likely due to  deficits from previous strokes  Functional History: Prior Function Bath: Minimal Meal Prep: Total Light Housekeeping: Total Able to Take Stairs?: Yes Driving: No Comments: pt with noted anxiety and cognitive deficts from previous strokes. Functional Status:  Mobility: Bed Mobility Bed Mobility: Rolling Right;Right Sidelying to Sit;Sitting - Scoot to Edge of Bed Rolling Right: 4: Min guard;With rail Right Sidelying to Sit: 4: Min guard;HOB flat;With rails Sitting - Scoot to Edge of Bed: 4: Min guard Transfers Transfers: Sit to Stand;Stand to [employer] Transfers Sit to Stand: 1: +2 Total assist;With upper extremity assist;From bed Sit to Stand: Patient  Percentage: 40% Stand to Sit: 1: +2 Total assist;With upper extremity assist;To bed;To chair/3-in-1;With armrests Stand to Sit: Patient Percentage: 40% Stand Pivot Transfers: 1: +2 Total assist Stand Pivot Transfers: Patient Percentage: 40% Ambulation/Gait Ambulation/Gait Assistance: Not tested (comment) (due to pain & anxiety.  ) Ambulation Distance (Feet):  (only able to march in place) Assistive device: Rolling walker Ambulation/Gait Assistance Details: pt upon standing with excruciating pain and anxiety inhibiting pt ability to attempt walking or listen to v/c's of PT/OT Stairs: No    ADL: ADL Eating/Feeding: Independent Where Assessed - Eating/Feeding: Chair Grooming: Set up Where Assessed - Grooming: Unsupported sitting Upper Body Bathing: Minimal assistance Where Assessed - Upper Body Bathing: Unsupported sitting Lower Body Bathing: Moderate assistance Where Assessed - Lower Body Bathing: Unsupported sitting;Supported sit to stand Upper Body Dressing: Set up Where Assessed - Upper Body Dressing: Unsupported sitting Lower Body Dressing: Moderate assistance;Set up (can donn socks crossing foot over opposite knee) Where Assessed - Lower Body Dressing: Supported sit to stand;Unsupported sitting Equipment Used: Gait  belt;Rolling walker Transfers/Ambulation Related to ADLs: Pt unable to ambulate, stood x 2 only with +2 total 40 %, pt sat abruptly stating she couldn't walk, although she was able to pick her feet up while holding the walker. ADL Comments: Pt able to cross her foot over her opposite knee.  Unable to stand and release walker for LB bathing and dressing .  Pt appearing quite anxious.  Oriented, but difficulty getting accurate and consistent information from her about her PLOF.  Cognition: Cognition Arousal/Alertness: Awake/alert Orientation Level: Oriented X4 Cognition Overall Cognitive Status: Impaired Area of Impairment: Following commands;Awareness of errors;Safety/judgement Arousal/Alertness: Awake/alert Orientation Level: Oriented X4 / Intact Behavior During Session: WFL for tasks performed (becomes anxious with OOB activity.  ) Current Attention Level: Alternating Attention - Other Comments: noted to lose her train of train of thought,  distractable Safety/Judgement: Impulsive Safety/Judgement - Other Comments: limited by anxiety Awareness of Errors: Assistance required to identify errors made Awareness of Errors - Other Comments: pt impulsive sat down without reaching for chair Cognition - Other Comments: Pt becomes anxious with sit<>stand & bed>recliner transfer.   Requires cues for safe technique, however pt not following cues.    Blood pressure 170/60, pulse 73, temperature 97.7 F (36.5 C), temperature source Oral, resp. rate 18, height 5\' 4"  (1.626 m), weight 72.122 kg (159 lb), SpO2 93.00%. Physical Exam  Vitals reviewed. Constitutional: She is oriented to person, place, and time.  HENT:  Head: Normocephalic.  Eyes:       Pupils round and reactive to light  Neck: Neck supple. No thyromegaly present.  Cardiovascular: Normal rate and regular rhythm.   Pulmonary/Chest: Effort normal and breath sounds normal. No respiratory distress.  Abdominal: Soft. Bowel sounds are  normal. She exhibits no distension.  Musculoskeletal: She exhibits no edema.       Pes planus deformities. Back appropriately tender.   Neurological: She is alert and oriented to person, place, and time.       Follows full commands. Stocking glove sensory loss in either leg. Strength 5/5 in ue. LE 2+ prox to 4/4 distally. dtr's 1+. Good sitting balance  Skin:       Back incision is dressed  Psychiatric: She has a normal mood and affect. Her behavior is normal. Judgment and thought content normal.    Results for orders placed during the hospital encounter of 10/18/12 (from the past 24 hour(s))  GLUCOSE, CAPILLARY     Status: Abnormal  Collection Time   10/20/12 11:32 AM      Component Value Range   Glucose-Capillary 197 (*) 70 - 99 mg/dL  GLUCOSE, CAPILLARY     Status: Abnormal   Collection Time   10/20/12 10:00 PM      Component Value Range   Glucose-Capillary 222 (*) 70 - 99 mg/dL  CBC     Status: Abnormal   Collection Time   10/21/12  6:43 AM      Component Value Range   WBC 12.8 (*) 4.0 - 10.5 K/uL   RBC 2.66 (*) 3.87 - 5.11 MIL/uL   Hemoglobin 8.2 (*) 12.0 - 15.0 g/dL   HCT 16.1 (*) 09.6 - 04.5 %   MCV 95.1  78.0 - 100.0 fL   MCH 30.8  26.0 - 34.0 pg   MCHC 32.4  30.0 - 36.0 g/dL   RDW 40.9 (*) 81.1 - 91.4 %   Platelets 150  150 - 400 K/uL  BASIC METABOLIC PANEL     Status: Abnormal   Collection Time   10/21/12  6:43 AM      Component Value Range   Sodium 132 (*) 135 - 145 mEq/L   Potassium 3.7  3.5 - 5.1 mEq/L   Chloride 96  96 - 112 mEq/L   CO2 26  19 - 32 mEq/L   Glucose, Bld 182 (*) 70 - 99 mg/dL   BUN 30 (*) 6 - 23 mg/dL   Creatinine, Ser 7.82  0.50 - 1.10 mg/dL   Calcium 8.3 (*) 8.4 - 10.5 mg/dL   GFR calc non Af Amer 49 (*) >90 mL/min   GFR calc Af Amer 57 (*) >90 mL/min  GLUCOSE, CAPILLARY     Status: Abnormal   Collection Time   10/21/12  6:49 AM      Component Value Range   Glucose-Capillary 181 (*) 70 - 99 mg/dL   No results  found.  Assessment/Plan: Diagnosis: lumbar spondylolisthesis with radiculopathy s/p decompression/fusion 1. Does the need for close, 24 hr/day medical supervision in concert with the patient's rehab needs make it unreasonable for this patient to be served in a less intensive setting? Potentially 2. Co-Morbidities requiring supervision/potential complications: dm, thn, ckd, anemia 3. Due to bladder management, bowel management, safety, skin/wound care, disease management, medication administration, pain management and patient education, does the patient require 24 hr/day rehab nursing? Potentially 4. Does the patient require coordinated care of a physician, rehab nurse, PT (1-2 hrs/day, 5 days/week) and OT (1-2 hrs/day, 5 days/week) to address physical and functional deficits in the context of the above medical diagnosis(es)? Yes Addressing deficits in the following areas: balance, endurance, locomotion, strength, transferring, bowel/bladder control, bathing, dressing, feeding, grooming, toileting and psychosocial support 5. Can the patient actively participate in an intensive therapy program of at least 3 hrs of therapy per day at least 5 days per week? Potentially 6. The potential for patient to make measurable gains while on inpatient rehab is good 7. Anticipated functional outcomes upon discharge from inpatient rehab are mod I to supervision with PT, supervision to min assist with OT, n/a with SLP. 8. Estimated rehab length of stay to reach the above functional goals is: ?1 week  9. Does the patient have adequate social supports to accommodate these discharge functional goals? Potentially 10. Anticipated D/C setting: Home 11. Anticipated post D/C treatments: HH therapy 12. Overall Rehab/Functional Prognosis: excellent  RECOMMENDATIONS: This patient's condition is appropriate for continued rehabilitative care in the following setting: [facility] Patient  has agreed to participate in recommended program.  Yes Note that insurance prior authorization may be required for reimbursement for recommended care.  Comment: While I think she could benefit from inpatient rehab, she is ADAMANT that she doesn't want to be "pushed" and prefers to go home to do rehab. We will follow along in case things change.  Ranelle Oyster, MD, [place] [facility]     10/21/2012

## 2012-10-21 NOTE — Progress Notes (Signed)
[facility] PROGRESS NOTE  Katie Pace:[id number] DOB: 1935/11/09 DOA: 10/18/2012 PCP: Dwana Melena, MD  Assessment/Plan: Principal Problem:  *Spondylolisthesis of lumbar region L3-4 L4-5 with stenosis Active Problems:  Sheehan syndrome  Hypertension  Diabetes mellitus  CKD (chronic kidney disease), stage II  Anemia    1. Spondylolisthesis of lumbar region L3-4, L4,-5 stenosis: Patient is s/p Lumbar decompression via laminectomy and decompression of nerve roots 10/18/12. Management as per Dr. Danielle Dess, neurosurgery. Clinically, appears to be doing well on POD#3. SNF rehab is planned.  2. Sheehan syndrome/panhypopituitarism:   Patient is on replacement therapy with Hydrocortisone/Levothyroxine/Somatropin, and appears stable. She was managed briefly with stress doses of steroids, but has been changed to pre-admission dose as of 10/20/12.  3. Diabetes: This is type 2. Managing with diet/SSI/Actos, with reasonable control.   4. Hypertension: Was initially controlled on pre-admission doses of Lisinopril, Atenolol, and Diltiazem. However, BP has crept up in the last 48 hours, so ave increased Lisinopril from 10 mg daily to 20 mg daily.  5. GERD: Asymptomatic on PPI.   6. Hyperlipidemia:  Continued on  statin.  7. Chronic kidney disease stage II/III: Creatinine on 10/11/2012 was 1.11, and has over the last year, ranged from 0.91-2.84. Currently at baseline. 8. Anemia: Patient has a mild normocytic anemia of 9.0 as of 10/19/12. This may be due to ABLA on chronic anemia. Baseline HB was 11.6 on 10/11/12. This is reasonable. Following CBC.  9. Constipation: Will optimize bowel regimen.     Code Status: Full Code.  Family Communication:  Disposition Plan: Per Primary service. .    Brief narrative: 77 y.o. Caucasian female with history of Sheehan syndrome resulting in hypopituitarism, hypertension, hypothyroidism, type 2 diabetes but not on any diabetic medications, hyperlipidemia,  osteopenia, history of C. difficile colitis and acute renal failure with now chronic kidney disease stage II/III. Patient has on long history of back pain which was treated conservatively however she has had spondylitic disease with severe stenosis, and as a result Dr. Danielle Dess performed lumbar decompression via laminectomy of L3 and L4, decompression of L3, L4 and L5 nerve roots. The hospitalist service was consulted to help manage patient's medical conditions. Peri-operatively.   Consultants:  [facility]  Procedures:  S/p Lumbar laminectomy of L3 and L4 on 10/18/12.  Antibiotics:  N/A.   HPI/Subjective: Feels much better pain-wise, constipated.   Objective: Vital signs in last 24 hours: Temp:  [97.4 F (36.3 C)-98.8 F (37.1 C)] 98.1 F (36.7 C) (01/17 1744) Pulse Rate:  [61-73] 68  (01/17 1744) Resp:  [17-18] 18  (01/17 1744) BP: (136-170)/(50-62) 160/55 mmHg (01/17 1744) SpO2:  [92 %-100 %] 100 % (01/17 1744) Weight change:  Last BM Date: 10/17/12  Intake/Output from previous day: 01/16 0701 - 01/17 0700 In: 1140 [P.O.:1140] Out: -      Physical Exam: General: Comfortable in chair, alert, communicative, fully oriented, not short of breath at rest.  HEENT:  Mild clinical pallor, no jaundice, no conjunctival injection or discharge. Hydration is fair.  NECK:  Supple, JVP not seen, no carotid bruits, no palpable lymphadenopathy, no palpable goiter. CHEST:  Clinically clear to auscultation, no wheezes, no crackles. HEART:  Sounds 1 and 2 heard, normal, regular, no murmurs. ABDOMEN:  Moderately obese, soft, non-tender, no palpable organomegaly, no palpable masses, normal bowel sounds. GENITALIA:  Not examined. LOWER EXTREMITIES:  No pitting edema, palpable peripheral pulses. MUSCULOSKELETAL SYSTEM:  Generalized osteoarthritic changes, otherwise, normal. CENTRAL NERVOUS SYSTEM:  No focal neurologic deficit on gross  examination.  Lab Results:  [facility] 10/21/12 0643 10/19/12 0733   WBC 12.8* 13.2*  HGB 8.2* 9.0*  HCT 25.3* 27.9*  PLT 150 143*    Basename 10/21/12 0643 10/19/12 0733  NA 132* 141  K 3.7 3.7  CL 96 103  CO2 26 28  GLUCOSE 182* 163*  BUN 30* 18  CREATININE 1.08 0.90  CALCIUM 8.3* 8.3*   No results found for this or any previous visit (from the past 240 hour(s)).   Studies/Results: No results found.  Medications: Scheduled Meds:    . amitriptyline  25 mg Oral QHS  . atenolol  50 mg Oral Daily  . atorvastatin  10 mg Oral q1800  . diltiazem  240 mg Oral Daily  . hydrocortisone  10 mg Oral QHS  . hydrocortisone  20 mg Oral Daily  . insulin aspart  0-15 Units Subcutaneous TID WC  . insulin aspart  0-5 Units Subcutaneous QHS  . levothyroxine  75 mcg Oral Daily  . lisinopril  20 mg Oral Daily  . pantoprazole  80 mg Oral Q1200  . pioglitazone  45 mg Oral QAC breakfast  . potassium citrate  20 mEq Oral q morning - 10a  . sodium chloride  3 mL Intravenous Q12H  . Somatropin  0.3 mg Subcutaneous QPM   Continuous Infusions:    . sodium chloride 250 mL (10/18/12 1600)  . sodium chloride 100 mL/hr at 10/19/12 0334   PRN Meds:.acetaminophen, acetaminophen, alum & mag hydroxide-simeth, diazepam, HYDROcodone-acetaminophen, menthol-cetylpyridinium, methocarbamol (ROBAXIN) IV, methocarbamol, morphine injection, ondansetron (ZOFRAN) IV, oxyCODONE-acetaminophen, phenol, sodium chloride    LOS: 3 days   Venson Ferencz,CHRISTOPHER  [facility] Pager [phone]. If 8PM-8AM, please contact night-coverage at [email or web address], password [email or web address] 10/21/2012, 7:09 PM  LOS: 3 days

## 2012-10-21 NOTE — Progress Notes (Signed)
Patient ID: Katie Pace, female   DOB: [birth year], 77 y.o.   MRN: [id number] Subjective: Patient reports feeling batter  Objective: Vital signs in last 24 hours: Temp:  [97.4 F (36.3 C)-98.8 F (37.1 C)] 98.1 F (36.7 C) (01/17 1403) Pulse Rate:  [61-73] 68  (01/17 1403) Resp:  [17-20] 18  (01/17 1403) BP: (126-170)/(50-62) 161/62 mmHg (01/17 1403) SpO2:  [92 %-99 %] 98 % (01/17 1403)  Intake/Output from previous day: 01/16 0701 - 01/17 0700 In: 1140 [P.O.:1140] Out: -  Intake/Output this shift: Total I/O In: 760 [P.O.:760] Out: -   Neuiro stable. Wound clean and dry  Lab Results:  [facility] 10/21/12 0643 10/19/12 0733  WBC 12.8* 13.2*  HGB 8.2* 9.0*  HCT 25.3* 27.9*  PLT 150 143*   BMET  Basename 10/21/12 0643 10/19/12 0733  NA 132* 141  K 3.7 3.7  CL 96 103  CO2 26 28  GLUCOSE 182* 163*  BUN 30* 18  CREATININE 1.08 0.90  CALCIUM 8.3* 8.3*    Studies/Results: No results found.  Assessment/Plan: Improving and increasing activity slowly. Dr Jordan Likes / Yetta Barre covering this weekend. She will decide when she feels ready to go home.  LOS: 3 days  as above   Reinaldo Meeker, MD 10/21/2012, 5:03 PM

## 2012-10-21 NOTE — Progress Notes (Signed)
Physical Therapy Treatment Patient Details Name: Katie Pace MRN: [id number] DOB: [birth year] Today's Date: 10/21/2012 Time: 5784-6962 PT Time Calculation (min): 36 min  PT Assessment / Plan / Recommendation Comments on Treatment Session  Discussed expectations on CIR patient and daughter in law. Patient stated " I dont think I can do that" referring to three hours of therapy per day. Patient seems to think that her husband can take care of her. Daughter in law is concerned about mental status and is considering SNF placement.   Patient fitted for lumbar corset.    Follow Up Recommendations  SNF;Supervision/Assistance - 24 hour     Does the patient have the potential to tolerate intense rehabilitation     Barriers to Discharge        Equipment Recommendations  None recommended by PT    Recommendations for Other Services    Frequency Min 4X/week   Plan Discharge plan remains appropriate    Precautions / Restrictions Precautions Precautions: Back Precaution Comments: Patient stated that she knows precautions but was unable to state any of the three. Re-educated and reviewed all with patient and daughter in law.  Required Braces or Orthoses: Spinal Brace Spinal Brace: Lumbar corset;Applied in sitting position   Pertinent Vitals/Pain 6/10. RN notified. Repositioned and brace adjusted.    Mobility  Transfers Sit to Stand: 1: +2 Total assist;From chair/3-in-1;With upper extremity assist Sit to Stand: Patient Percentage: 40% Stand to Sit: 1: +2 Total assist;With upper extremity assist;To chair/3-in-1 Stand to Sit: Patient Percentage: 40% Details for Transfer Assistance: Pt able to stand in upright position one of four attempts due to fatigue and inability to move hand from chair to walker. Max cues for hand placement and extension of LEs. Pt sits abruptly when trying to move hand to walker. Ambulation/Gait Ambulation/Gait Assistance: Not tested (comment) Ambulation/Gait  Assistance Details: PT refused to ambulate this session due to pain and anxiety.     Exercises     PT Diagnosis:    PT Problem List:   PT Treatment Interventions:     PT Goals Acute Rehab PT Goals PT Goal: Sit to Stand - Progress: Progressing toward goal Additional Goals PT Goal: Additional Goal #1 - Progress: Not progressing  Visit Information  Last PT Received On: 10/21/12 Assistance Needed: +2    Subjective Data      Cognition  Overall Cognitive Status: Impaired Area of Impairment: Memory;Safety/judgement;Awareness of errors Arousal/Alertness: Awake/alert Behavior During Session: [facility] for tasks performed    Balance     End of Session PT - End of Session Equipment Utilized During Treatment: Back brace Activity Tolerance: Patient limited by fatigue;Patient limited by pain Patient left: in chair;with family/visitor present;with call bell/phone within reach Nurse Communication: Mobility status;Patient requests pain meds   GP     Lazaro Arms 10/21/2012, 1:24 PM

## 2012-10-21 NOTE — Clinical Social Work Psychosocial (Signed)
     Clinical Social Work Department BRIEF PSYCHOSOCIAL ASSESSMENT 10/21/2012  Patient:  Katie Pace, Katie Pace     Account Number:  [id number]     Admit date:  10/18/2012  Clinical Social Worker:  Peggyann Shoals  Date/Time:  10/21/2012 01:00 PM  Referred by:  Physician  Date Referred:  10/19/2012 Referred for  SNF Placement   Other Referral:   Interview type:  Patient Other interview type:    PSYCHOSOCIAL DATA Living Status:  HUSBAND Admitted from facility:   Level of care:   Primary support name:  Richard Hail Primary support relationship to patient:  SPOUSE Degree of support available:   supportive.    CURRENT CONCERNS Current Concerns  Post-Acute Placement   Other Concerns:    SOCIAL WORK ASSESSMENT / PLAN CSW met with pt to address consult for SNF. CSW introduced herself and explained role of social work. CSW also explained the process of discharging to SNF.    Pt lives with her husband. Pt's two children live next door and are supportive. Pt shared that she is not interested in going to SNF for rehb. Pt would be prefer to return home and have HHPT. Pt's husband is agreeable. Pt's husband will be available 24 hours a day. Pt's children, would be able to assist and provide support as needed. Pt is    Pt declined SNF. CSW is signing off as no further needs identified. Pt will call this CSW if she changes her mind about SNF placement. CSW is available if needed.   Assessment/plan status:  Psychosocial Support/Ongoing Assessment of Needs Other assessment/ plan:   Information/referral to community resources:   SNF list    PATIENTS/FAMILYS RESPONSE TO PLAN OF CARE: Pt and husband were very pleasant. Pt and husband are agreeable to discharge plan to home with home health services.

## 2012-10-22 DIAGNOSIS — E230 Hypopituitarism: Secondary | ICD-10-CM | POA: Diagnosis not present

## 2012-10-22 DIAGNOSIS — I10 Essential (primary) hypertension: Secondary | ICD-10-CM | POA: Diagnosis not present

## 2012-10-22 DIAGNOSIS — N182 Chronic kidney disease, stage 2 (mild): Secondary | ICD-10-CM | POA: Diagnosis not present

## 2012-10-22 DIAGNOSIS — E119 Type 2 diabetes mellitus without complications: Secondary | ICD-10-CM | POA: Diagnosis not present

## 2012-10-22 LAB — GLUCOSE, CAPILLARY
Glucose-Capillary: 134 mg/dL — ABNORMAL HIGH (ref 70–99)
Glucose-Capillary: 109 mg/dL — ABNORMAL HIGH (ref 70–99)
Glucose-Capillary: 146 mg/dL — ABNORMAL HIGH (ref 70–99)

## 2012-10-22 LAB — BASIC METABOLIC PANEL
CO2: 25 mEq/L (ref 19–32)
Calcium: 8.2 mg/dL — ABNORMAL LOW (ref 8.4–10.5)
Creatinine, Ser: 0.95 mg/dL (ref 0.50–1.10)
GFR calc Af Amer: 67 mL/min — ABNORMAL LOW (ref >90)
GFR calc non Af Amer: 58 mL/min — ABNORMAL LOW (ref >90)

## 2012-10-22 NOTE — Progress Notes (Signed)
[facility] PROGRESS NOTE  Katie Pace:[id number] DOB: [birth year] DOA: 10/18/2012 PCP: Dwana Melena, MD  Assessment/Plan: Principal Problem:  *Spondylolisthesis of lumbar region L3-4 L4-5 with stenosis Active Problems:  Sheehan syndrome  Hypertension  Diabetes mellitus  CKD (chronic kidney disease), stage II  Anemia    1. Spondylolisthesis of lumbar region L3-4, L4,-5 stenosis: Patient is s/p Lumbar decompression via laminectomy and decompression of nerve roots 10/18/12. Management as per Dr. Danielle Dess, neurosurgery. Clinically, appears to be doing well on POD#4. Continued on PT/OT/Rehab. .  2. Sheehan syndrome/panhypopituitarism:   Patient is on replacement therapy with Hydrocortisone/Levothyroxine/Somatropin, and appears stable. She was managed briefly with stress doses of steroids, but has been changed to pre-admission dose as of 10/20/12.  3. Diabetes: This is type 2. Managing with diet/SSI/Actos, with reasonable control.   4. Hypertension: Was initially controlled on pre-admission doses of Lisinopril, Atenolol, and Diltiazem. However, as BP has crept up in the last couple of days, we have increased Lisinopril from 10 mg daily to 20 mg daily, form 10/21/12, with satisfactory control.  5. GERD: Asymptomatic on PPI.   6. Hyperlipidemia:  Continued on  statin.  7. Chronic kidney disease stage II/III: Creatinine on 10/11/2012 was 1.11, and has over the last year, ranged from 0.91-2.84. Currently at baseline. 8. Anemia: Patient has a mild normocytic anemia of 9.0 as of 10/19/12. This may be due to ABLA on chronic anemia. Baseline HB was 11.6 on 10/11/12. This is reasonable. Following CBC.  9. Constipation: Have placed on bowel regimen. Moved bowels on 10/21/12.     Code Status: Full Code.  Family Communication:  Disposition Plan: Per Primary service.   Comment: patient's medical; issues are stable. The medical team has nothing more to offer. We have signed off today. Please  discharge on current dose of Lisinopril and pre-admission oral hypoglycemics, when primary team feels she is ready.    Brief narrative: 77 y.o. 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 difficile colitis and acute renal failure with now chronic kidney disease stage II/III. Patient has on long history of back pain which was treated conservatively however she has had spondylitic disease with severe stenosis, and as a result Dr. Danielle Dess performed lumbar decompression via laminectomy of L3 and L4, decompression of L3, L4 and L5 nerve roots. The hospitalist service was consulted to help manage patient's medical conditions. Peri-operatively.   Consultants:  [facility]  Procedures:  S/p Lumbar laminectomy of L3 and L4 on 10/18/12.  Antibiotics:  N/A.   HPI/Subjective: Moved bowels on 10/21/12, otherwise, no new issues.   Objective: Vital signs in last 24 hours: Temp:  [97.5 F (36.4 C)-98.3 F (36.8 C)] 98.2 F (36.8 C) (01/18 0956) Pulse Rate:  [65-75] 75  (01/18 0956) Resp:  [18-20] 18  (01/18 0956) BP: (131-168)/(53-90) 131/90 mmHg (01/18 0956) SpO2:  [93 %-100 %] 98 % (01/18 0956) Weight change:  Last BM Date: 10/22/12  Intake/Output from previous day: 01/17 0701 - 01/18 0700 In: 760 [P.O.:760] Out: -      Physical Exam: General: Comfortable in chair, alert, communicative, fully oriented, not short of breath at rest.  HEENT:  Mild clinical pallor, no jaundice, no conjunctival injection or discharge. Hydration is fair.  NECK:  Supple, JVP not seen, no carotid bruits, no palpable lymphadenopathy, no palpable goiter. CHEST:  Clinically clear to auscultation, no wheezes, no crackles. HEART:  Sounds 1  and 2 heard, normal, regular, no murmurs. ABDOMEN:  Moderately obese, soft, non-tender, no palpable organomegaly, no palpable  masses, normal bowel sounds. GENITALIA:  Not examined. LOWER EXTREMITIES:  No pitting edema, palpable peripheral pulses. MUSCULOSKELETAL SYSTEM:  Generalized osteoarthritic changes, otherwise, normal. CENTRAL NERVOUS SYSTEM:  No focal neurologic deficit on gross examination.  Lab Results:  [facility] 10/21/12 0643  WBC 12.8*  HGB 8.2*  HCT 25.3*  PLT 150    Basename 10/22/12 0545 10/21/12 0643  NA 132* 132*  K 4.1 3.7  CL 96 96  CO2 25 26  GLUCOSE 121* 182*  BUN 31* 30*  CREATININE 0.95 1.08  CALCIUM 8.2* 8.3*   No results found for this or any previous visit (from the past 240 hour(s)).   Studies/Results: No results found.  Medications: Scheduled Meds:    . amitriptyline  25 mg Oral QHS  . atenolol  50 mg Oral Daily  . atorvastatin  10 mg Oral q1800  . diltiazem  240 mg Oral Daily  . hydrocortisone  10 mg Oral QHS  . hydrocortisone  20 mg Oral Daily  . insulin aspart  0-15 Units Subcutaneous TID WC  . insulin aspart  0-5 Units Subcutaneous QHS  . levothyroxine  75 mcg Oral Daily  . lisinopril  20 mg Oral Daily  . pantoprazole  80 mg Oral Q1200  . pioglitazone  45 mg Oral QAC breakfast  . polyethylene glycol  17 g Oral Daily  . potassium citrate  20 mEq Oral q morning - 10a  . sodium chloride  3 mL Intravenous Q12H  . Somatropin  0.3 mg Subcutaneous QPM   Continuous Infusions:    . sodium chloride 250 mL (10/18/12 1600)   PRN Meds:.acetaminophen, acetaminophen, alum & mag hydroxide-simeth, diazepam, HYDROcodone-acetaminophen, menthol-cetylpyridinium, methocarbamol (ROBAXIN) IV, methocarbamol, morphine injection, ondansetron (ZOFRAN) IV, oxyCODONE-acetaminophen, phenol, sodium chloride    LOS: 4 days   Purvi Ruehl,CHRISTOPHER  [facility] Pager [phone]. If 8PM-8AM, please contact night-coverage at [email or web address], password [facility] 10/22/2012, 1:17 PM  LOS: 4 days

## 2012-10-22 NOTE — Progress Notes (Signed)
Overall stable. No new issues. Patient's back pain fairly well controlled. Mobility still too poor for discharge home.  Afebrile with stable vitals. Motor and sensory function stable. Chest and abdomen benign.  Progressing reasonably well following multilevel decompression and fusion. Continue rehabilitation efforts. Patient desires discharge home with home therapy. We will work towards that goal over the next few days.

## 2012-10-23 LAB — GLUCOSE, CAPILLARY
Glucose-Capillary: 131 mg/dL — ABNORMAL HIGH (ref 70–99)
Glucose-Capillary: 119 mg/dL — ABNORMAL HIGH (ref 70–99)

## 2012-10-23 NOTE — Progress Notes (Signed)
Patient ID: Katie Pace, female   DOB: 04/29/1936, 77 y.o.   MRN: [id number] Subjective: Patient reports continued pain. Still slowly mobilizing.  Objective: Vital signs in last 24 hours: Temp:  [97.5 F (36.4 C)-98.2 F (36.8 C)] 98.1 F (36.7 C) (01/19 0524) Pulse Rate:  [63-78] 78  (01/19 0524) Resp:  [18-20] 20  (01/19 0524) BP: (124-180)/(50-90) 180/69 mmHg (01/19 0524) SpO2:  [92 %-98 %] 96 % (01/19 0524)  Intake/Output from previous day: 01/18 0701 - 01/19 0700 In: 1000 [P.O.:1000] Out: -  Intake/Output this shift:    Neurologic: Grossly normal  Lab Results: Lab Results  Component Value Date   WBC 12.8* 10/21/2012   HGB 8.2* 10/21/2012   HCT 25.3* 10/21/2012   MCV 95.1 10/21/2012   PLT 150 10/21/2012   Lab Results  Component Value Date   INR 1.33 04/01/2012   BMET Lab Results  Component Value Date   NA 132* 10/22/2012   K 4.1 10/22/2012   CL 96 10/22/2012   CO2 25 10/22/2012   GLUCOSE 121* 10/22/2012   BUN 31* 10/22/2012   CREATININE 0.95 10/22/2012   CALCIUM 8.2* 10/22/2012    Studies/Results: No results found.  Assessment/Plan: Still very slow to mobilize. He did work with pain control and mobility.   LOS: 5 days    Zayne Marovich S 10/23/2012, 8:06 AM

## 2012-10-24 ENCOUNTER — Inpatient Hospital Stay (HOSPITAL_COMMUNITY): Payer: Medicare Other

## 2012-10-24 DIAGNOSIS — M47817 Spondylosis without myelopathy or radiculopathy, lumbosacral region: Secondary | ICD-10-CM | POA: Diagnosis not present

## 2012-10-24 DIAGNOSIS — M4310 Spondylolisthesis, site unspecified: Secondary | ICD-10-CM | POA: Diagnosis not present

## 2012-10-24 LAB — GLUCOSE, CAPILLARY
Glucose-Capillary: 119 mg/dL — ABNORMAL HIGH (ref 70–99)
Glucose-Capillary: 138 mg/dL — ABNORMAL HIGH (ref 70–99)
Glucose-Capillary: 175 mg/dL — ABNORMAL HIGH (ref 70–99)

## 2012-10-24 NOTE — Progress Notes (Signed)
Pt ambulated in the room with a front wheel walker and 1 assist.  Pt was very slow to get up and initially very unsteady upon standing, but ambulated with a slow and steady gait.  Pt tolerated ambulation fairly well and was back to bed without incidence.  Will continue to monitor closely.

## 2012-10-24 NOTE — Progress Notes (Signed)
Rehab admissions - Evaluated for possible admission.  Noted patient wishes to discharge home with [facility] therapies when medically ready.  If this changes or she does not progress well, could consider inpatient rehab.  At this point, recommend pursuit of [facility] therapies upon discharge.  Call me for questions.  #[phone]

## 2012-10-24 NOTE — Progress Notes (Signed)
Patient ID: Katie Pace, female   DOB: 25-Aug-1936, 77 y.o.   MRN: [id number] Vital signs are stable. Patient complains of substantial pain with getting up and walking about. Ambulation has been minimal and only in the room. She is nearing one week's time postoperatively.  Her incision is clean and dry. Individual muscle testing appears good and iliopsoas quadricep tibialis anterior and gastroc.  Patient complains mostly of pain with standing for any length of time. I will obtain a CT scan of lumbar spine to make sure hardware is well-placed. We'll review with her the morning.

## 2012-10-24 NOTE — Progress Notes (Signed)
Pt refuses to ambulate against encouragement to do so.  She states that she is "not ready" and "knows what I can/can't do".  Will continue to encourage ambulation.

## 2012-10-24 NOTE — Progress Notes (Signed)
Physical Therapy Treatment Patient Details Name: DUNG PRIEN MRN: [id number] DOB: [birth year] Today's Date: 10/24/2012 Time: 9528-4132 PT Time Calculation (min): 39 min  PT Assessment / Plan / Recommendation Comments on Treatment Session  Pt became very agitated in todays session.  Not receptive to cues for increased safety.  Pt still has yet to ambulate with PT services, but there is documentation stating she ambulated in room with Nsing.  Pt only agreeable to perform stand pivot transfer bed>3-in-1>recliner & refused to attempt ambulation at this time.  Discussed with pt & husband Re: d/c recommendations at this time & due to pt has yet to ambulate with therapy, we are still recommending ST-SNF prior to d/c home.      Follow Up Recommendations  SNF;Supervision/Assistance - 24 hour     Does the patient have the potential to tolerate intense rehabilitation     Barriers to Discharge        Equipment Recommendations  None recommended by PT    Recommendations for Other Services    Frequency Min 4X/week   Plan Discharge plan remains appropriate    Precautions / Restrictions Precautions Precautions: Back Precaution Comments: Pt able to recall 1/3 back precautions.  Re-educated on all 3.   Required Braces or Orthoses: Spinal Brace Spinal Brace: Lumbar corset;Applied in sitting position Restrictions Weight Bearing Restrictions: No   Pertinent Vitals/Pain C/o back pain but did not rate.  Premedicated.  Pt reports pain limits her mobility    Mobility  Bed Mobility Bed Mobility: Sitting - Scoot to Edge of Bed;Rolling Right;Right Sidelying to Sit Rolling Right: 4: Min guard Right Sidelying to Sit: 3: Mod assist;HOB flat Sitting - Scoot to Edge of Bed: 4: Min guard Details for Bed Mobility Assistance: Increased time.  Pt requesting for no (A) to complete transition but required mod (A) to perform side>sit.  Required cues to reinforce technique & back  precautions Transfers Transfers: Sit to Stand;Stand to Sit;Stand Pivot Transfers Sit to Stand: 1: +2 Total assist;With upper extremity assist;From bed;From chair/3-in-1;With armrests Sit to Stand: Patient Percentage: 50% Stand to Sit: 3: Mod assist;With upper extremity assist;With armrests;To chair/3-in-1 Stand Pivot Transfers: 4: Min assist Details for Transfer Assistance: Pt requires multiple attempts before achieving complete upright stance.  Cont's to sit abruptly with initial attempts to achieve full standing.    Does not follow cues for safe hand placement.   Ambulation/Gait Ambulation/Gait Assistance: Not tested (comment)      PT Goals Acute Rehab PT Goals Time For Goal Achievement: 10/26/12 Potential to Achieve Goals: Fair Pt will Roll Supine to Right Side: with supervision;with rail PT Goal: Rolling Supine to Right Side - Progress: Progressing toward goal Pt will go Supine/Side to Sit: with min assist;with HOB 0 degrees;with rail PT Goal: Supine/Side to Sit - Progress: Not met Pt will go Sit to Stand: with min assist;with upper extremity assist PT Goal: Sit to Stand - Progress: Progressing toward goal Pt will Transfer Bed to Chair/Chair to Bed: with min assist PT Transfer Goal: Bed to Chair/Chair to Bed - Progress: Progressing toward goal Pt will Ambulate: 16 - 50 feet;with min assist;with rolling walker Additional Goals Additional Goal #1: Pt able to recall 3/3 back precautions independently and 100% compliant PT Goal: Additional Goal #1 - Progress: Progressing toward goal  Visit Information  Last PT Received On: 10/24/12 Assistance Needed: +2    Subjective Data  Subjective: Youre crowding me! Patient Stated Goal: to go home   Cognition  Overall Cognitive Status:  Impaired Area of Impairment: Memory;Following commands;Safety/judgement;Awareness of errors;Awareness of deficits Arousal/Alertness: Awake/alert Orientation Level: Appears intact for tasks  assessed Behavior During Session: Agitated Memory: Decreased recall of precautions Following Commands: Other (comment) (not receptive to safety cues) Safety/Judgement: Decreased awareness of safety precautions;Decreased safety judgement for tasks assessed;Impulsive;Decreased awareness of need for assistance Safety/Judgement - Other Comments: Pt not receptive to cues for safe technique.  Becomes agitated.   Awareness of Errors: Assistance required to identify errors made;Assistance required to correct errors made Awareness of Errors - Other Comments: pt impulsive sat down without reaching for chair Cognition - Other Comments: Pt becomes very agitated when she is given cues for increased safety.  Not receptive to cues.      Balance     End of Session PT - End of Session Equipment Utilized During Treatment: Gait belt;Back brace Activity Tolerance: Patient limited by pain;Patient limited by fatigue Patient left: in chair;with call bell/phone within reach;with family/visitor present Nurse Communication: Mobility status    Verdell Face, [place] [phone] 10/24/2012

## 2012-10-25 LAB — GLUCOSE, CAPILLARY
Glucose-Capillary: 113 mg/dL — ABNORMAL HIGH (ref 70–99)
Glucose-Capillary: 125 mg/dL — ABNORMAL HIGH (ref 70–99)
Glucose-Capillary: 136 mg/dL — ABNORMAL HIGH (ref 70–99)

## 2012-10-25 MED ORDER — DEXAMETHASONE 6 MG PO TABS
6.0000 mg | ORAL_TABLET | Freq: Once | ORAL | Status: AC
  Administered 2012-10-25: 6 mg via ORAL
  Filled 2012-10-25: qty 1

## 2012-10-25 NOTE — Progress Notes (Signed)
Subjective: Patient reports Complains of back pain with movement  Objective: Vital signs in last 24 hours: Temp:  [98 F (36.7 C)-98.8 F (37.1 C)] 98.6 F (37 C) (01/21 1420) Pulse Rate:  [60-98] 73  (01/21 1420) Resp:  [18-20] 20  (01/21 1420) BP: (123-159)/(45-78) 148/48 mmHg (01/21 1420) SpO2:  [92 %-98 %] 96 % (01/21 1420)  Intake/Output from previous day:   Intake/Output this shift:    Incision is clean and dry motor function is intact in major groups including iliopsoas quadriceps tibialis anterior and gastroc  Lab Results: No results found for this basename: WBC:2,HGB:2,HCT:2,PLT:2 in the last 72 hours BMET No results found for this basename: NA:2,K:2,CL:2,CO2:2,GLUCOSE:2,BUN:2,CREATININE:2,CALCIUM:2 in the last 72 hours  Studies/Results: Ct Lumbar Spine Wo Contrast  10/25/2012  *RADIOLOGY REPORT*  Clinical Data: Low back pain after fusion.  CT LUMBAR SPINE WITHOUT CONTRAST  Technique:  Multidetector CT imaging of the lumbar spine was performed without intravenous contrast administration. Multiplanar CT image reconstructions were also generated.  Comparison: Intraoperative films 10/18/2012.  MRI lumbar spine preoperative 08/03/2012.  Findings: Prior lumbar decompression the laminectomy L3 and L4, bilateral pedicle screws at L3, L4 and L5, along with interbody cage placement at L3-4 and L4-5.  The alignment is anatomic.  The pedicle screws are well placed entirely within their respective vertebral bodies, except for slight insignificant anterior extension of the left L5 pedicle into the retroperitoneum.  No associated hematoma.  There is 2 mm of facet mediated anterolisthesis L2-L3 without protrusion.  The L5-S1 disc space appears unremarkable without protrusion or spinal stenosis; slight facet arthropathy at this level is unchanged.  At L3-L4, there is slight subsidence of the right greater than left PEEK spacers into the L4 vertebral body; this measures 3 mm on the right and 1 mm  on the left.  the vast majority of the autologous bone graft remains within the interspace at this level.  At L4-L5, there is moderate subsidence of the PEEK cages 6-7 mm into the L5 vertebral body, right greater than left. This is accompanied by loss of interspace height, along with right lateral and left posterior migration of the autologous bone graft material. Right L4 and left L5 nerve root displacement are possible.  IMPRESSION: Status post L3-L5 decompression and fusion.  The alignment is anatomic and the hardware is appropriately placed and intact.  Moderate subsidence of the L4-5 PEEK interbody cages into the L5 vertebral body is accompanied by right lateral and left posterior extrusion of autologous bone graft. Right L4 and/or left L5 nerve root displacement is possible.   Original Report Authenticated By: Davonna Belling, M.D.     Assessment/Plan: Stable postop day 7  LOS: 7 days  We have singular dose of Decadron to see if this doesn't intact patient's pain substantially. CT scan shows that the hardware is in good positionn no complicating features are noted. Patient may be ready for discharge when pain is better controlled.   Margree Gimbel J 10/25/2012, 5:44 PM

## 2012-10-25 NOTE — Progress Notes (Signed)
Occupational Therapy Treatment Patient Details Name: Katie Pace MRN: [id number] DOB: Sep 17, 1936 Today's Date: 10/25/2012 Time: 5366-4403 OT Time Calculation (min): 38 min  OT Assessment / Plan / Recommendation Comments on Treatment Session Pt continues to be limited by severe pain with mobility. Pt was cooperative, responded favorable to pacing the session according to her pain tolerance.    Follow Up Recommendations  SNF    Barriers to Discharge       Equipment Recommendations  None recommended by OT    Recommendations for Other Services    Frequency Min 2X/week   Plan Discharge plan remains appropriate    Precautions / Restrictions Precautions Precautions: Back;Fall Precaution Booklet Issued: Yes (comment) Precaution Comments: Reviewed back precautions. Required Braces or Orthoses: Spinal Brace Spinal Brace: Lumbar corset;Applied in sitting position Restrictions Weight Bearing Restrictions: No   Pertinent Vitals/Pain 8/10 in sitting, 10/10 with sit and standing, premedicated, repositioned    ADL  Grooming: Wash/dry face;Wash/dry hands;Teeth care;Set up Where Assessed - Grooming: Supine, head of bed up Upper Body Dressing: Moderate assistance (back brace) Where Assessed - Upper Body Dressing: Unsupported sitting Equipment Used: Back brace;Gait belt;Rolling walker Transfers/Ambulation Related to ADLs: Pt continues to be unable to ambulate due to pain. ADL Comments: Pt unable to stand unsupported for grooming, pericare, or LB ADL due to pain.    OT Diagnosis:    OT Problem List:   OT Treatment Interventions:     OT Goals ADL Goals Pt Will Perform Grooming: Standing at sink;with min assist ADL Goal: Grooming - Progress: Not progressing Pt Will Perform Lower Body Bathing: with min assist;Sit to stand from chair ADL Goal: Lower Body Bathing - Progress: Not progressing Pt Will Perform Lower Body Dressing: with min assist;Sit to stand from chair Pt Will Transfer  to Toilet: with min assist;Ambulation;3-in-1;Maintaining back safety precautions ADL Goal: Toilet Transfer - Progress: Not progressing Pt Will Perform Toileting - Clothing Manipulation: with min assist;Standing ADL Goal: Toileting - Clothing Manipulation - Progress: Not progressing Pt Will Perform Toileting - Hygiene: with min assist;Sit to stand from 3-in-1/toilet ADL Goal: Toileting - Hygiene - Progress: Not progressing Pt Will Perform Tub/Shower Transfer: Tub transfer;Maintaining back safety precautions;Ambulation Miscellaneous OT Goals Miscellaneous OT Goal #1: Pt will state 3 of 3 back precautions. OT Goal: Miscellaneous Goal #1 - Progress: Progressing toward goals Miscellaneous OT Goal #2: Pt will perform log roll technique for bed mobility with min assist in prep for ADL. OT Goal: Miscellaneous Goal #2 - Progress: Met (continue goal for consistency)  Visit Information  Last OT Received On: 10/25/12 Assistance Needed: +2 PT/OT Co-Evaluation/Treatment: Yes    Subjective Data      Prior Functioning       Cognition  Overall Cognitive Status: Impaired Area of Impairment: Memory;Safety/judgement Arousal/Alertness: Awake/alert Orientation Level: Oriented X4 / Intact Behavior During Session: Anxious Safety/Judgement: Decreased awareness of safety precautions;Decreased safety judgement for tasks assessed;Impulsive Safety/Judgement - Other Comments: due to anxiety and increaed pain pt very impulsive Awareness of Errors: Assistance required to identify errors made Awareness of Errors - Other Comments: verbal cues to generalize back precautions    Mobility  Shoulder Instructions Bed Mobility Bed Mobility: Rolling Left;Left Sidelying to Sit;Sitting - Scoot to Edge of Bed Rolling Left: 4: Min guard;With rail Left Sidelying to Sit: 4: Min assist Sitting - Scoot to Edge of Bed: 5: Supervision Details for Bed Mobility Assistance: Pt requires increased time due to  pain. Transfers Transfers: Sit to Stand;Stand to Sit Sit to Stand: 2:  Max assist;From elevated surface;With upper extremity assist;From bed;1: +2 Total assist;From chair/3-in-1 Sit to Stand: Patient Percentage: 50% Stand to Sit: 3: Mod assist;With upper extremity assist;To chair/3-in-1 Details for Transfer Assistance: Pt requires assist to maintain upright in order to move her hands to the walker in standing.       Exercises      Balance     End of Session OT - End of Session Activity Tolerance: Patient limited by pain Patient left: in chair;with call bell/phone within reach  GO     Evern Bio 10/25/2012, 11:25 AM [phone]

## 2012-10-25 NOTE — Progress Notes (Signed)
Physical Therapy Treatment Patient Details Name: Katie Pace MRN: [id number] DOB: [birth year] Today's Date: 10/25/2012 Time: 4098-1191 PT Time Calculation (min): 28 min  PT Assessment / Plan / Recommendation Comments on Treatment Session  Pt con't to be greatly limited by 10/10 back pain that radiates into R hip and leg. Pt has been unable to ambulate with therapy due to this pain. Pt con't to be unsafe to d/c home due to intense pain greatly limiting functional mobility requring increaed assist for all mobility. Con't to recommend ST-SNF if pain con't to be limitting factor to achieve increased functional mobility to decrease burden of care on spouse upon d/c.    Follow Up Recommendations  SNF;Supervision/Assistance - 24 hour     Does the patient have the potential to tolerate intense rehabilitation     Barriers to Discharge        Equipment Recommendations  None recommended by PT    Recommendations for Other Services    Frequency Min 4X/week   Plan Discharge plan remains appropriate;Frequency remains appropriate    Precautions / Restrictions Precautions Precautions: Back;Fall Precaution Booklet Issued: Yes (comment) Precaution Comments: Reviewed back precautions. Required Braces or Orthoses: Spinal Brace Spinal Brace: Lumbar corset;Applied in sitting position Restrictions Weight Bearing Restrictions: No   Pertinent Vitals/Pain 8/10 back pain in sitting, 10/10 back/R hip and LE pain in standing    Mobility  Bed Mobility Bed Mobility: Rolling Left;Left Sidelying to Sit;Sitting - Scoot to Edge of Bed Rolling Left: 4: Min guard;With rail Left Sidelying to Sit: 4: Min assist Sitting - Scoot to Edge of Bed: 5: Supervision Details for Bed Mobility Assistance: Pt requires increased time due to pain. Transfers Transfers: Sit to Stand;Stand to Sit;Stand Pivot Transfers Sit to Stand: 2: Max assist;From elevated surface;With upper extremity assist;From bed;1: +2 Total  assist;From chair/3-in-1 Sit to Stand: Patient Percentage: 50% Stand to Sit: 3: Mod assist;With upper extremity assist;To chair/3-in-1 Stand to Sit: Patient Percentage: 50% Stand Pivot Transfers: 1: +2 Total assist Stand Pivot Transfers: Patient Percentage: 70% Details for Transfer Assistance: Pt requires assist to maintain upright in order to move her hands to the walker in standing. Ambulation/Gait Ambulation/Gait Assistance: Not tested (comment) (only able to take 3 steps to chair )    Exercises     PT Diagnosis:    PT Problem List:   PT Treatment Interventions:     PT Goals Acute Rehab PT Goals PT Goal: Rolling Supine to Right Side - Progress: Progressing toward goal PT Goal: Supine/Side to Sit - Progress: Progressing toward goal PT Goal: Sit to Stand - Progress: Progressing toward goal PT Transfer Goal: Bed to Chair/Chair to Bed - Progress: Progressing toward goal PT Goal: Ambulate - Progress: Not progressing (due to pain) Additional Goals PT Goal: Additional Goal #1 - Progress: Not progressing  Visit Information  Last PT Received On: 10/25/12 Assistance Needed: +2 PT/OT Co-Evaluation/Treatment: Yes    Subjective Data  Subjective: Pt received supine in bed working with OT agreeable to PT however reports "The pain is so bad" Patient Stated Goal: home   Cognition  Overall Cognitive Status: Impaired Area of Impairment: Memory;Safety/judgement Arousal/Alertness: Awake/alert Orientation Level: Oriented X4 / Intact Behavior During Session: Anxious Safety/Judgement: Decreased awareness of safety precautions;Decreased safety judgement for tasks assessed;Impulsive Safety/Judgement - Other Comments: due to anxiety and increaed pain pt very impulsive Awareness of Errors: Assistance required to identify errors made Awareness of Errors - Other Comments: verbal cues to generalize back precautions    Balance  End of Session PT - End of Session Equipment Utilized During  Treatment: Gait belt;Back brace Activity Tolerance: Patient limited by pain Patient left: in chair;with call bell/phone within reach;with family/visitor present Nurse Communication: Mobility status   GP     Marcene Brawn 10/25/2012, 11:26 AM

## 2012-10-26 LAB — GLUCOSE, CAPILLARY

## 2012-10-26 MED ORDER — OXYCODONE-ACETAMINOPHEN 5-325 MG PO TABS: 1.0000 | ORAL_TABLET | ORAL | Status: DC | PRN

## 2012-10-26 NOTE — Progress Notes (Signed)
NCM spoke to pt and husband about scheduled d/c this evening. States she does not want HH PT or RN for pain management at this time. States she has bars in her bathroom and shower chair. She has RW but does need a 3n1. Pt states her pain in a 5 on scale of 0-10. She able to ambulate with her RW and believes she will do well with her husband's assistance at home. NCM explained if she wants HH that her PCP can order from the office. Will wait further orders or instructions from attending MD. Isidoro Donning RN CCM Case Mgmt phone [phone]

## 2012-10-26 NOTE — Discharge Summary (Signed)
Physician Discharge Summary  Patient ID: Katie Pace MRN: [id number] DOB/AGE: 02-25-1936 77 y.o.  Admit date: 10/18/2012 Discharge date: 10/26/2012  Admission Diagnoses: Spondylolisthesis L3-4 L4-5 with stenosis, lumbar radiculopathy  Discharge Diagnoses: Spondylolisthesis L3-4 L4-5 with stenosis, lumbar radiculopathy Sheehan syndrome panhypopituitarism diabetes mellitus chronic kidney disease acute blood loss anemia Principal Problem:  *Spondylolisthesis of lumbar region L3-4 L4-5 with stenosis Active Problems:  Sheehan syndrome  Hypertension  Diabetes mellitus  CKD (chronic kidney disease), stage II  Anemia   Discharged Condition: good  Hospital Course: She was admitted to undergo surgical decompression and stabilization at L4-5 lumbar 3-4 she tolerated the surgery well however the patient had significant difficulties with pain and mobilization as she been considerably weakened prior to surgery she required extensive physical therapy to get mobilized. Assessment of her medications regarding her Sheehan syndrome also seemed to exacerbate painful symptoms nonetheless over time the patient improved substantially.  Consults: Hospitalist  Significant Diagnostic Studies: MRI lumbar  Treatments: surgery: Decompression of L3-4 L4-5 posterior lumbar interbody arthrodesis using peek spacers L3-4 L4-5 segmental pedicle screw fixation L3-L5 posterior lateral arthrodesis L3-L5  Discharge Exam: Blood pressure 138/53, pulse 82, temperature 98.5 F (36.9 C), temperature source Oral, resp. rate 18, height 5\' 4"  (1.626 m), weight 72.122 kg (159 lb), SpO2 98.00%. Incision is clean and dry this. Wound in the stoma and ecchymosis.  Disposition: 06-Home-Health Care Svc  Discharge Orders    Future Appointments: Provider: Department: Dept Phone: Center:   03/01/2013 8:30 AM [facility] [phone] [facility]   03/01/2013 9:00 AM [facility] [phone] [facility]   03/01/2013 10:00 AM [facility] - [phone] [facility]   03/01/2013 11:00 AM Evern Bio, NP [facility] [phone] [facility]     Future Orders Please Complete By Expires   Diet - low sodium heart healthy      Increase activity slowly      Discharge instructions      Comments:   Okay to shower. Do not apply salves or appointments to incision. No heavy lifting with the upper extremities greater than 15 pounds. May resume driving when not requiring pain medication and patient feels comfortable with doing so.   Call MD for:  redness, tenderness, or signs of infection (pain, swelling, redness, odor or green/yellow discharge around incision site)      Call MD for:  severe uncontrolled pain      Call MD for:  temperature >100.4          Medication List     As of 10/26/2012  6:59 PM    TAKE these medications         alendronate 70 MG tablet   Commonly known as: FOSAMAX   Take 70 mg by mouth every 7 (seven) days. Takes on Mondays. Take with a full glass of water on an empty stomach.      amitriptyline 25 MG tablet   Commonly known as: ELAVIL   Take 25 mg by mouth at bedtime.      aspirin EC 81 MG tablet   Take 162 mg by mouth at bedtime.      atenolol 50 MG tablet   Commonly known as: TENORMIN   Take 50 mg by mouth daily.      cholecalciferol 1000 UNITS tablet   Commonly known as: VITAMIN D   Take 3,000 Units by mouth daily.  diltiazem 240 MG 24 hr capsule   Commonly known as: CARDIZEM CD   Take 240 mg by mouth daily.      dipyridamole-aspirin 200-25 MG per 12 hr capsule   Commonly known as: AGGRENOX   Take 1 capsule by mouth 2 (two) times daily.      esomeprazole 40 MG capsule   Commonly known as: NEXIUM   Take 40 mg by mouth 2 (two) times daily.      glucose monitoring kit monitoring kit   1 each by Does not apply route as needed.      HUMATROPE 5 MG Solr     Generic drug: Somatropin   Inject 0.3 mg into the skin every evening.      hydrocortisone 20 MG tablet   Commonly known as: CORTEF   Take 10-20 mg by mouth daily. Takes 20mg  in the morning and takes 10mg  in the evening.      levothyroxine 75 MCG tablet   Commonly known as: SYNTHROID, LEVOTHROID   Take 75 mcg by mouth daily.      lisinopril 10 MG tablet   Commonly known as: PRINIVIL,ZESTRIL   Take 1 tablet (10 mg total) by mouth daily.      oxyCODONE-acetaminophen 5-325 MG per tablet   Commonly known as: PERCOCET/ROXICET   Take 1-2 tablets by mouth every 4 (four) hours as needed for pain.      pioglitazone 45 MG tablet   Commonly known as: ACTOS   Take 45 mg by mouth every morning.      potassium citrate 10 MEQ (1080 MG) SR tablet   Commonly known as: UROCIT-K   Take 20 mEq by mouth every morning.      rosuvastatin 5 MG tablet   Commonly known as: CRESTOR   Take 5 mg by mouth at bedtime.      traMADol 50 MG tablet   Commonly known as: ULTRAM   Take 100 mg by mouth 3 (three) times daily as needed. For pain.         SignedStefani Dama 10/26/2012, 6:59 PM

## 2012-10-26 NOTE — Progress Notes (Signed)
Patient ID: Katie Pace, female   DOB: [birth year], 77 y.o.   MRN: [id number] Stable day to day mobilization improving slowly plan discharge in a.m.

## 2012-10-26 NOTE — Progress Notes (Signed)
Physical Therapy Treatment Patient Details Name: Katie Pace MRN: [id number] DOB: 1935/12/15 Today's Date: 10/26/2012 Time: 9528-4132 PT Time Calculation (min): 17 min  PT Assessment / Plan / Recommendation Comments on Treatment Session  Pt with improved ambulation distance however con't to be at increased falls risk, decreased safety awareness and requires assist for all transfers and ADLs. Pt to con't to benefit from ST-SNF stay to achieve supervision function to decrease caregiver burden upon d/c home.    Follow Up Recommendations  SNF;Supervision/Assistance - 24 hour     Does the patient have the potential to tolerate intense rehabilitation     Barriers to Discharge        Equipment Recommendations  None recommended by PT    Recommendations for Other Services    Frequency Min 4X/week   Plan Discharge plan remains appropriate;Frequency remains appropriate    Precautions / Restrictions Precautions Precautions: Back;Fall Precaution Comments: pt re-educated Required Braces or Orthoses: Spinal Brace Spinal Brace: Lumbar corset Restrictions Weight Bearing Restrictions: No   Pertinent Vitals/Pain 8/10 at back     Mobility  Bed Mobility Bed Mobility: Not assessed Transfers Transfers: Sit to Stand;Stand to Sit Sit to Stand: 2: Max assist;With armrests;From chair/3-in-1 (maxA to transition hands from armrest to chair) Stand to Sit: 4: Min assist;With armrests;To chair/3-in-1 Details for Transfer Assistance: pt able to push herself up into standing but requires  maxA to maintain upright position while she transitions hands from arm rests to chair Ambulation/Gait Ambulation/Gait Assistance: 4: Min assist Ambulation Distance (Feet): 40 Feet Assistive device: Rolling walker Ambulation/Gait Assistance Details: pt ambulation endurance limited by increased back pain. Pt  with increased bilat UE WBing. No LOB with RW but relies heavily on RW. minA for walker management around  obstacles Gait Pattern: Step-to pattern;Decreased stride length;Decreased step length - right Gait velocity: slow General Gait Details: limited by UE fatigue and increased back pain Stairs: No    Exercises     PT Diagnosis:    PT Problem List:   PT Treatment Interventions:     PT Goals Acute Rehab PT Goals Time For Goal Achievement: 11/02/12 PT Goal: Rolling Supine to Right Side - Progress: Progressing toward goal PT Goal: Supine/Side to Sit - Progress: Progressing toward goal PT Goal: Sit to Stand - Progress: Progressing toward goal PT Transfer Goal: Bed to Chair/Chair to Bed - Progress: Progressing toward goal PT Goal: Ambulate - Progress: Progressing toward goal Additional Goals PT Goal: Additional Goal #1 - Progress: Not met  Visit Information  Last PT Received On: 10/26/12 Assistance Needed: +2    Subjective Data  Subjective: Pt received sitting up in bed agreeable to PT. Patient Stated Goal: home   Cognition  Overall Cognitive Status: Impaired Area of Impairment: Memory;Safety/judgement Arousal/Alertness: Awake/alert Orientation Level: Oriented X4 / Intact Behavior During Session: Anxious Memory Deficits: decreased recall of precautions Safety/Judgement: Decreased awareness of safety precautions;Decreased safety judgement for tasks assessed;Impulsive    Balance     End of Session PT - End of Session Equipment Utilized During Treatment: Gait belt;Back brace Activity Tolerance: Patient limited by pain Patient left: in chair;with call bell/phone within reach;with family/visitor present Nurse Communication: Mobility status   GP     Marcene Brawn 10/26/2012, 12:58 PM

## 2012-10-26 NOTE — Progress Notes (Addendum)
CCM states that after taking to pt, pt is ready to be d/c home, because pt is not on any iv pain med and can manage her pain at home by taking her po pain med. She said she will also leave a sticky note for the MD to look into discharging pt. Also, CCM stated to call Deman, to bring the needed equipment to pt for discharge and he has been called. ------Thomes Burak, rn

## 2012-10-27 LAB — GLUCOSE, CAPILLARY

## 2012-10-27 NOTE — Progress Notes (Signed)
Physical Therapy Treatment Patient Details Name: Katie Pace MRN: [id number] DOB: Aug 05, 1936 Today's Date: 10/27/2012 Time: 7829-5621 PT Time Calculation (min): 19 min  PT Assessment / Plan / Recommendation Comments on Treatment Session  Plan for pt to d/c home today, still feel as though pt has safety concerns for discharge with husband. Pt requiring extensive assistance during standing as well as ambulation distance limited by pain.     Follow Up Recommendations  SNF;Supervision/Assistance - 24 hour     Does the patient have the potential to tolerate intense rehabilitation     Barriers to Discharge        Equipment Recommendations  None recommended by PT    Recommendations for Other Services    Frequency Min 4X/week   Plan Discharge plan remains appropriate;Frequency remains appropriate    Precautions / Restrictions Precautions Precautions: Back;Fall Precaution Comments: pt re-educated Required Braces or Orthoses: Spinal Brace Spinal Brace: Lumbar corset Restrictions Weight Bearing Restrictions: No   Pertinent Vitals/Pain Pain 10/10. RN aware.     Mobility  Bed Mobility Bed Mobility: Not assessed Transfers Transfers: Sit to Stand;Stand to Sit Sit to Stand: 3: Mod assist;With upper extremity assist;From bed;From chair/3-in-1 Stand to Sit: 4: Min assist;With armrests;To chair/3-in-1 Details for Transfer Assistance: Pt slow movement and has difficulty with second half of stand transitioning hands from bed to RW. Assist for stability. Ambulation/Gait Ambulation/Gait Assistance: 4: Min assist Ambulation Distance (Feet): 20 Feet Assistive device: Rolling walker Ambulation/Gait Assistance Details: Distance limited by back pain 10/10 with ambulation. Pt impulsive with quick movements, reaching RW far out in front with cueing Gait Pattern: Step-to pattern;Decreased stride length;Decreased step length - right Stairs: Yes Stairs Assistance: 4: Min assist Stairs  Assistance Details (indicate cue type and reason): VC for safe technique with RW and min assist by husband. Unable to complete second step as pt complained of the inability to bring her leg up, reaching for rails. discussed with pt and husband that this would need to be completed at home with RW as she has no rails. Pt and husband stated that they have done it numerous times at home with RW and understand how to do it safely. Stair Management Technique: No rails;Backwards;With walker Number of Stairs: 1     Exercises     PT Diagnosis:    PT Problem List:   PT Treatment Interventions:     PT Goals Acute Rehab PT Goals PT Goal: Sit to Stand - Progress: Progressing toward goal PT Transfer Goal: Bed to Chair/Chair to Bed - Progress: Progressing toward goal PT Goal: Ambulate - Progress: Progressing toward goal Additional Goals PT Goal: Additional Goal #1 - Progress: Progressing toward goal  Visit Information  Last PT Received On: 10/27/12 Assistance Needed: +1    Subjective Data  Patient Stated Goal: home   Cognition  Overall Cognitive Status: Impaired Area of Impairment: Memory;Safety/judgement Arousal/Alertness: Awake/alert Orientation Level: Oriented X4 / Intact Behavior During Session: Anxious Memory Deficits: decreased recall of precautions Safety/Judgement: Decreased awareness of safety precautions;Decreased safety judgement for tasks assessed;Impulsive    Balance     End of Session PT - End of Session Equipment Utilized During Treatment: Gait belt;Back brace Activity Tolerance: Patient limited by pain Patient left: in chair;with call bell/phone within reach;with family/visitor present Nurse Communication: Mobility status   GP     Milana Kidney 10/27/2012, 11:01 AM

## 2012-10-27 NOTE — Progress Notes (Signed)
AVS given with discharge instructions this am, pt's home medication picked up from Pharmacy and given to pt and spouse, prescription  script give,  back precaution reinforced. Awaiting to be picked up after lunch.  Pt and husband refused home health services offered. Condition prior to discharge is stable.

## 2012-12-05 DIAGNOSIS — I10 Essential (primary) hypertension: Secondary | ICD-10-CM | POA: Diagnosis not present

## 2012-12-05 DIAGNOSIS — E039 Hypothyroidism, unspecified: Secondary | ICD-10-CM | POA: Diagnosis not present

## 2012-12-05 DIAGNOSIS — E119 Type 2 diabetes mellitus without complications: Secondary | ICD-10-CM | POA: Diagnosis not present

## 2012-12-05 DIAGNOSIS — E785 Hyperlipidemia, unspecified: Secondary | ICD-10-CM | POA: Diagnosis not present

## 2012-12-09 ENCOUNTER — Emergency Department (HOSPITAL_COMMUNITY)
Admission: EM | Admit: 2012-12-09 | Discharge: 2012-12-09 | Disposition: A | Discharge status: Home / Self Care | Payer: Medicare Other | Attending: Emergency Medicine | Admitting: Emergency Medicine

## 2012-12-09 ENCOUNTER — Encounter (HOSPITAL_COMMUNITY): Payer: Self-pay | Admitting: Emergency Medicine

## 2012-12-09 DIAGNOSIS — Z8679 Personal history of other diseases of the circulatory system: Secondary | ICD-10-CM | POA: Insufficient documentation

## 2012-12-09 DIAGNOSIS — IMO0002 Reserved for concepts with insufficient information to code with codable children: Secondary | ICD-10-CM | POA: Diagnosis not present

## 2012-12-09 DIAGNOSIS — W19XXXA Unspecified fall, initial encounter: Secondary | ICD-10-CM

## 2012-12-09 DIAGNOSIS — E039 Hypothyroidism, unspecified: Secondary | ICD-10-CM | POA: Insufficient documentation

## 2012-12-09 DIAGNOSIS — Z862 Personal history of diseases of the blood and blood-forming organs and certain disorders involving the immune mechanism: Secondary | ICD-10-CM | POA: Insufficient documentation

## 2012-12-09 DIAGNOSIS — I10 Essential (primary) hypertension: Secondary | ICD-10-CM | POA: Diagnosis not present

## 2012-12-09 DIAGNOSIS — R296 Repeated falls: Secondary | ICD-10-CM | POA: Insufficient documentation

## 2012-12-09 DIAGNOSIS — Z8739 Personal history of other diseases of the musculoskeletal system and connective tissue: Secondary | ICD-10-CM | POA: Insufficient documentation

## 2012-12-09 DIAGNOSIS — Z8673 Personal history of transient ischemic attack (TIA), and cerebral infarction without residual deficits: Secondary | ICD-10-CM | POA: Diagnosis not present

## 2012-12-09 DIAGNOSIS — S51811A Laceration without foreign body of right forearm, initial encounter: Secondary | ICD-10-CM

## 2012-12-09 DIAGNOSIS — R011 Cardiac murmur, unspecified: Secondary | ICD-10-CM | POA: Diagnosis not present

## 2012-12-09 DIAGNOSIS — Z8639 Personal history of other endocrine, nutritional and metabolic disease: Secondary | ICD-10-CM | POA: Insufficient documentation

## 2012-12-09 DIAGNOSIS — K219 Gastro-esophageal reflux disease without esophagitis: Secondary | ICD-10-CM | POA: Insufficient documentation

## 2012-12-09 DIAGNOSIS — Z87891 Personal history of nicotine dependence: Secondary | ICD-10-CM | POA: Diagnosis not present

## 2012-12-09 DIAGNOSIS — Z8701 Personal history of pneumonia (recurrent): Secondary | ICD-10-CM | POA: Diagnosis not present

## 2012-12-09 DIAGNOSIS — E119 Type 2 diabetes mellitus without complications: Secondary | ICD-10-CM | POA: Insufficient documentation

## 2012-12-09 DIAGNOSIS — S51809A Unspecified open wound of unspecified forearm, initial encounter: Secondary | ICD-10-CM | POA: Diagnosis not present

## 2012-12-09 DIAGNOSIS — Y9389 Activity, other specified: Secondary | ICD-10-CM | POA: Insufficient documentation

## 2012-12-09 DIAGNOSIS — M545 Low back pain: Secondary | ICD-10-CM | POA: Diagnosis not present

## 2012-12-09 DIAGNOSIS — Z79899 Other long term (current) drug therapy: Secondary | ICD-10-CM | POA: Diagnosis not present

## 2012-12-09 DIAGNOSIS — M549 Dorsalgia, unspecified: Secondary | ICD-10-CM

## 2012-12-09 DIAGNOSIS — Y929 Unspecified place or not applicable: Secondary | ICD-10-CM | POA: Insufficient documentation

## 2012-12-09 MED ORDER — HYDROCODONE-ACETAMINOPHEN 5-325 MG PO TABS
1.0000 | ORAL_TABLET | Freq: Once | ORAL | Status: AC
  Administered 2012-12-09: 1 via ORAL
  Filled 2012-12-09: qty 1

## 2012-12-09 NOTE — ED Notes (Signed)
Telfa and wrap applied per order.  Patient tolerated procedure well.

## 2012-12-09 NOTE — ED Notes (Signed)
Pt alert & oriented x4, Patient given discharge instructions, paperwork & prescription(s). Patient instructed to stop at the registration desk to finish any additional paperwork. Patient verbalized understanding. Pt left department w/ no further questions.  

## 2012-12-09 NOTE — ED Notes (Signed)
Patient states she fell and presents to ER with c/o right forearm laceration and lower back pain.

## 2012-12-09 NOTE — ED Provider Notes (Signed)
History     CSN: [id number]  Arrival date & time 12/09/12  0006   First MD Initiated Contact with Patient 12/09/12 0025      Chief Complaint  Patient presents with  . Fall  . Back Pain  . Extremity Laceration    (Consider location/radiation/quality/duration/timing/severity/associated sxs/prior treatment) HPI Katie Pace is a 77 y.o. female who presents to the Emergency Department complaining of fall just prior to arriving where she went to turn off a light using her walker and fell wih the walker, forward. Now with worsening pain to the back which was operated on in January and a skin tear to the left arm.  PCP Dr. Margo Aye  Past Medical History  Diagnosis Date  . Hypertension   . Hypothyroidism   . Stroke     Ischemic x 2       last one 2007  . Diabetes mellitus     type 11  . Endocrine disturbance     gland has not  worked until 1970.Marland Kitchen..dx in 1977  . Blood transfusion     back in 1973  . GERD (gastroesophageal reflux disease)     on meds to help....meds do well   . Sheehan syndrome 1977  . AAA (abdominal aortic aneurysm)     S/p aortic repair and endarterectomy 12/2011   . Carotid stenosis, left     S/p left CEA 09/2008  . PVD (peripheral vascular disease)   . C. difficile diarrhea   . Pneumonia     hx  . Heart murmur   . Arthritis   . Anemia     hx    Past Surgical History  Procedure Laterality Date  . Spine surgery      Lumbar Diskectomy  . Joint replacement  May 2009    Right knee  . Joint replacement  April 2008    Right Hip prothesis  . Carotid endarterectomy  Dec. 2009  . Cardiac catheterization      1980  &  2009  . Appendectomy      1960  . Cesarean section    . Back surgery      1990's  . Abdominal hysterectomy  1973    1973  . Fracture surgery      left ankle   . Abdominal aortic aneurysm repair  12/09/2011    Procedure: ANEURYSM ABDOMINAL AORTIC REPAIR;  Surgeon: Pryor Ochoa, MD;  Location: [facility] OR;  Service: Vascular;  Laterality:  N/A;  resection and grafting abdominal aortic aneurysm; Insertion Bilateral External Iliac Graft; Reexploration Proximal Anastemosis; Aortic Endarterectomy; Reimplantation Inferior Mesenteric Artery; Intraaortic Graft  . Abdominal aortic aneurysm repair      Family History  Problem Relation Age of Onset  . Heart disease Mother   . Peripheral vascular disease Mother   . Alcohol abuse Father   . Heart disease Sister   . Heart disease Brother     History  Substance Use Topics  . Smoking status: Former Smoker -- 0.50 packs/day for 4 years    Types: Cigarettes    Quit date: 10/11/1961  . Smokeless tobacco: Never Used  . Alcohol Use: No    OB History   Grav Para Term Preterm Abortions TAB SAB Ect Mult Living                  Review of Systems  Constitutional: Negative for fever.       10 Systems reviewed and are negative for acute change except  as noted in the HPI.  HENT: Negative for congestion.   Eyes: Negative for discharge and redness.  Respiratory: Negative for cough and shortness of breath.   Cardiovascular: Negative for chest pain.  Gastrointestinal: Negative for vomiting and abdominal pain.  Musculoskeletal: Negative for back pain.  Skin: Negative for rash.       Skin tear to right  Forearm.  Neurological: Negative for syncope, numbness and headaches.  Psychiatric/Behavioral:       No behavior change.    Allergies  Codeine; Dilaudid; Lincomycin hcl; Macrodantin; Zolpidem tartrate; Azithromycin; and Penicillins  Home Medications   Current Outpatient Rx  Name  Route  Sig  Dispense  Refill  . alendronate (FOSAMAX) 70 MG tablet   Oral   Take 70 mg by mouth every 7 (seven) days. Takes on Mondays. Take with a full glass of water on an empty stomach.         Marland Kitchen amitriptyline (ELAVIL) 25 MG tablet   Oral   Take 25 mg by mouth at bedtime.         Marland Kitchen aspirin EC 81 MG tablet   Oral   Take 162 mg by mouth at bedtime.         Marland Kitchen atenolol (TENORMIN) 50 MG  tablet   Oral   Take 50 mg by mouth daily.         . cholecalciferol (VITAMIN D) 1000 UNITS tablet   Oral   Take 3,000 Units by mouth daily.         Marland Kitchen diltiazem (CARDIZEM CD) 240 MG 24 hr capsule   Oral   Take 240 mg by mouth daily.         Marland Kitchen dipyridamole-aspirin (AGGRENOX) 25-200 MG per 12 hr capsule   Oral   Take 1 capsule by mouth 2 (two) times daily.         Marland Kitchen esomeprazole (NEXIUM) 40 MG capsule   Oral   Take 40 mg by mouth 2 (two) times daily.          Marland Kitchen glucose monitoring kit (FREESTYLE) monitoring kit   Does not apply   1 each by Does not apply route as needed.         . hydrocortisone (CORTEF) 20 MG tablet   Oral   Take 10-20 mg by mouth daily. Takes 20mg  in the morning and takes 10mg  in the evening.         Marland Kitchen levothyroxine (SYNTHROID, LEVOTHROID) 75 MCG tablet   Oral   Take 75 mcg by mouth daily.         Marland Kitchen lisinopril (PRINIVIL,ZESTRIL) 10 MG tablet   Oral   Take 1 tablet (10 mg total) by mouth daily.         Marland Kitchen oxyCODONE-acetaminophen (PERCOCET/ROXICET) 5-325 MG per tablet   Oral   Take 1-2 tablets by mouth every 4 (four) hours as needed for pain.   60 tablet   0   . pioglitazone (ACTOS) 45 MG tablet   Oral   Take 45 mg by mouth every morning.          . potassium citrate (UROCIT-K) 10 MEQ (1080 MG) SR tablet   Oral   Take 20 mEq by mouth every morning.          . rosuvastatin (CRESTOR) 5 MG tablet   Oral   Take 5 mg by mouth at bedtime.          . Somatropin (HUMATROPE) 5 MG SOLR  Subcutaneous   Inject 0.3 mg into the skin every evening.         . traMADol (ULTRAM) 50 MG tablet   Oral   Take 100 mg by mouth 3 (three) times daily as needed. For pain.           BP 186/79  Pulse 81  Temp(Src) 98.6 F (37 C) (Oral)  Resp 20  Ht 5\' 4"  (1.626 m)  Wt 153 lb (69.4 kg)  BMI 26.25 kg/m2  SpO2 100%  Physical Exam  Nursing note and vitals reviewed. Constitutional: She appears well-developed and well-nourished.   Awake, alert, nontoxic appearance.  HENT:  Head: Atraumatic.  Mark to left side of face.   Eyes: EOM are normal. Pupils are equal, round, and reactive to light. Right eye exhibits no discharge. Left eye exhibits no discharge.  Neck: Neck supple.  Cardiovascular: Normal rate.   Pulmonary/Chest: Effort normal. She exhibits no tenderness.  Abdominal: Soft. There is no tenderness. There is no rebound.  Musculoskeletal: She exhibits no tenderness.  Baseline ROM, no obvious new focal weakness.Well healed scar to lower back.   Neurological:  Mental status and motor strength appears baseline for patient and situation.  Skin: No rash noted.  Bruises to arms and hands.  Psychiatric: She has a normal mood and affect.    ED Course  Procedures (including critical care time)     MDM  Patient with fall and skin tear to right arm and lower back pain. Dressing applied to wound and hydrocodone given for the back pain. Pt stable in ED with no significant deterioration in condition.The patient appears reasonably screened and/or stabilized for discharge and I doubt any other medical condition or other [facility] requiring further screening, evaluation, or treatment in the ED at this time prior to discharge.  MDM Reviewed: nursing note and vitals           Nicoletta Dress. Colon Branch, MD 12/09/12 [phone]

## 2012-12-09 NOTE — ED Notes (Signed)
Pt fell tonight, complaining of right arm pain & lower back pain. Pt has Hx of lower back pain. Skin tear to the right forearm cleaned w/ sure clens & covered w/ gauze. Bleeding controled.

## 2012-12-21 DIAGNOSIS — M47817 Spondylosis without myelopathy or radiculopathy, lumbosacral region: Secondary | ICD-10-CM | POA: Diagnosis not present

## 2013-02-03 DIAGNOSIS — E538 Deficiency of other specified B group vitamins: Secondary | ICD-10-CM | POA: Diagnosis not present

## 2013-02-15 DIAGNOSIS — M479 Spondylosis, unspecified: Secondary | ICD-10-CM | POA: Diagnosis not present

## 2013-02-15 DIAGNOSIS — M48061 Spinal stenosis, lumbar region without neurogenic claudication: Secondary | ICD-10-CM | POA: Diagnosis not present

## 2013-02-17 ENCOUNTER — Other Ambulatory Visit: Payer: Self-pay | Admitting: Neurological Surgery

## 2013-02-17 DIAGNOSIS — M47816 Spondylosis without myelopathy or radiculopathy, lumbar region: Secondary | ICD-10-CM

## 2013-02-20 DIAGNOSIS — E119 Type 2 diabetes mellitus without complications: Secondary | ICD-10-CM | POA: Diagnosis not present

## 2013-02-20 DIAGNOSIS — E559 Vitamin D deficiency, unspecified: Secondary | ICD-10-CM | POA: Diagnosis not present

## 2013-02-20 DIAGNOSIS — E230 Hypopituitarism: Secondary | ICD-10-CM | POA: Diagnosis not present

## 2013-02-24 DIAGNOSIS — E538 Deficiency of other specified B group vitamins: Secondary | ICD-10-CM | POA: Diagnosis not present

## 2013-02-24 DIAGNOSIS — E230 Hypopituitarism: Secondary | ICD-10-CM | POA: Diagnosis not present

## 2013-02-24 DIAGNOSIS — M8440XA Pathological fracture, unspecified site, initial encounter for fracture: Secondary | ICD-10-CM | POA: Diagnosis not present

## 2013-02-24 DIAGNOSIS — E119 Type 2 diabetes mellitus without complications: Secondary | ICD-10-CM | POA: Diagnosis not present

## 2013-02-24 DIAGNOSIS — G609 Hereditary and idiopathic neuropathy, unspecified: Secondary | ICD-10-CM | POA: Diagnosis not present

## 2013-02-24 DIAGNOSIS — E559 Vitamin D deficiency, unspecified: Secondary | ICD-10-CM | POA: Diagnosis not present

## 2013-03-01 ENCOUNTER — Other Ambulatory Visit: Payer: Medicare Other

## 2013-03-01 ENCOUNTER — Ambulatory Visit: Payer: Medicare Other | Admitting: Neurosurgery

## 2013-03-27 ENCOUNTER — Other Ambulatory Visit: Payer: Self-pay | Admitting: *Deleted

## 2013-03-27 DIAGNOSIS — I739 Peripheral vascular disease, unspecified: Secondary | ICD-10-CM

## 2013-03-27 DIAGNOSIS — Z48812 Encounter for surgical aftercare following surgery on the circulatory system: Secondary | ICD-10-CM

## 2013-03-29 DIAGNOSIS — E538 Deficiency of other specified B group vitamins: Secondary | ICD-10-CM | POA: Diagnosis not present

## 2013-04-03 ENCOUNTER — Encounter: Payer: Self-pay | Admitting: Vascular Surgery

## 2013-04-04 ENCOUNTER — Other Ambulatory Visit (INDEPENDENT_AMBULATORY_CARE_PROVIDER_SITE_OTHER): Payer: Medicare Other

## 2013-04-04 ENCOUNTER — Encounter: Payer: Self-pay | Admitting: Vascular Surgery

## 2013-04-04 ENCOUNTER — Ambulatory Visit (INDEPENDENT_AMBULATORY_CARE_PROVIDER_SITE_OTHER): Payer: Medicare Other | Admitting: Vascular Surgery

## 2013-04-04 ENCOUNTER — Encounter (INDEPENDENT_AMBULATORY_CARE_PROVIDER_SITE_OTHER): Payer: Medicare Other

## 2013-04-04 VITALS — BP 186/83 | HR 70 | Resp 16 | Ht 64.5 in | Wt 146.0 lb

## 2013-04-04 DIAGNOSIS — I6529 Occlusion and stenosis of unspecified carotid artery: Secondary | ICD-10-CM | POA: Diagnosis not present

## 2013-04-04 DIAGNOSIS — Z48812 Encounter for surgical aftercare following surgery on the circulatory system: Secondary | ICD-10-CM

## 2013-04-04 DIAGNOSIS — I714 Abdominal aortic aneurysm, without rupture: Secondary | ICD-10-CM

## 2013-04-04 DIAGNOSIS — I739 Peripheral vascular disease, unspecified: Secondary | ICD-10-CM

## 2013-04-04 NOTE — Progress Notes (Signed)
Subjective:     Patient ID: Katie Pace, female   DOB: December 01, 1935, 77 y.o.   MRN: [id number]  HPI this 77 year old female is 15 months status post resection and grafting of a large abdominal aortic aneurysm. She has also had a previous left carotid endarterectomy. She has severe back problems and has had back surgery by Dr. Barnett Abu. Her biggest complaint today is back pain. She denies abdominal pain. She denies lateralizing weakness, aphasia, amaurosis fugax, diplopia, blurred vision, or syncope.  Past Medical History  Diagnosis Date  . Hypertension   . Hypothyroidism   . Stroke     Ischemic x 2       last one 2007  . Diabetes mellitus     type 11  . Endocrine disturbance     gland has not  worked until 1970.Marland Kitchen..dx in 1977  . Blood transfusion     back in 1973  . GERD (gastroesophageal reflux disease)     on meds to help....meds do well   . Sheehan syndrome 1977  . AAA (abdominal aortic aneurysm)     S/p aortic repair and endarterectomy 12/2011   . Carotid stenosis, left     S/p left CEA 09/2008  . PVD (peripheral vascular disease)   . C. difficile diarrhea   . Pneumonia     hx  . Heart murmur   . Arthritis   . Anemia     hx    History  Substance Use Topics  . Smoking status: Former Smoker -- 0.50 packs/day for 4 years    Types: Cigarettes    Quit date: 10/11/1961  . Smokeless tobacco: Never Used  . Alcohol Use: No    Family History  Problem Relation Age of Onset  . Heart disease Mother   . Peripheral vascular disease Mother   . Alcohol abuse Father   . Heart disease Sister   . Heart disease Brother     Allergies  Allergen Reactions  . Codeine Nausea Only  . Dilaudid (Hydromorphone Hcl) Nausea Only  . Lincomycin Hcl Other (See Comments)    Big bumps bilateral legs  . Macrodantin Hives  . Zolpidem Tartrate Other (See Comments)    Stroke  . Azithromycin Rash  . Penicillins Rash    Current outpatient prescriptions:alendronate (FOSAMAX) 70 MG  tablet, Take 70 mg by mouth every 7 (seven) days. Takes on Mondays. Take with a full glass of water on an empty stomach., Disp: , Rfl: ;  amitriptyline (ELAVIL) 25 MG tablet, Take 25 mg by mouth at bedtime., Disp: , Rfl: ;  aspirin EC 81 MG tablet, Take 162 mg by mouth at bedtime., Disp: , Rfl: ;  atenolol (TENORMIN) 50 MG tablet, Take 50 mg by mouth daily., Disp: , Rfl:  cholecalciferol (VITAMIN D) 1000 UNITS tablet, Take 3,000 Units by mouth daily., Disp: , Rfl: ;  diltiazem (CARDIZEM CD) 240 MG 24 hr capsule, Take 240 mg by mouth daily., Disp: , Rfl: ;  dipyridamole-aspirin (AGGRENOX) 25-200 MG per 12 hr capsule, Take 1 capsule by mouth 2 (two) times daily., Disp: , Rfl: ;  esomeprazole (NEXIUM) 40 MG capsule, Take 40 mg by mouth 2 (two) times daily. , Disp: , Rfl:  glucose monitoring kit (FREESTYLE) monitoring kit, 1 each by Does not apply route as needed., Disp: , Rfl: ;  hydrocortisone (CORTEF) 20 MG tablet, Take 10-20 mg by mouth daily. Takes 20mg  in the morning and takes 10mg  in the evening., Disp: , Rfl: ;  levothyroxine (SYNTHROID, LEVOTHROID) 75 MCG tablet, Take 75 mcg by mouth daily., Disp: , Rfl: ;  lisinopril (PRINIVIL,ZESTRIL) 10 MG tablet, Take 1 tablet (10 mg total) by mouth daily., Disp: , Rfl:  oxyCODONE-acetaminophen (PERCOCET/ROXICET) 5-325 MG per tablet, Take 1-2 tablets by mouth every 4 (four) hours as needed for pain., Disp: 60 tablet, Rfl: 0;  pioglitazone (ACTOS) 45 MG tablet, Take 45 mg by mouth every morning. , Disp: , Rfl: ;  potassium citrate (UROCIT-K) 10 MEQ (1080 MG) SR tablet, Take 20 mEq by mouth every morning. , Disp: , Rfl: ;  rosuvastatin (CRESTOR) 5 MG tablet, Take 5 mg by mouth at bedtime. , Disp: , Rfl:  Somatropin (HUMATROPE) 5 MG SOLR, Inject 0.3 mg into the skin every evening., Disp: , Rfl: ;  traMADol (ULTRAM) 50 MG tablet, Take 100 mg by mouth 3 (three) times daily as needed. For pain., Disp: , Rfl:   BP 186/83  Pulse 70  Resp 16  Ht 5' 4.5" (1.638 m)  Wt 146  lb (66.225 kg)  BMI 24.68 kg/m2  SpO2 96%  Body mass index is 24.68 kg/(m^2).           Review of Systems unable to cannulate without walker. Currently in a wheelchair. Denies chest pain or dyspnea on exertion. Severe back discomfort and leg discomfort. Other systems negative and complete review of systems    Objective:   Physical Exam blood pressure 186/83 heart rate 70 respirations 16 Gen.-alert and oriented x3 in no apparent distress-frail in a wheelchair  HEENT normal for age Lungs no rhonchi or wheezing Cardiovascular regular rhythm no murmurs carotid pulses 3+ palpable no bruits audible Abdomen soft nontender no palpable masses Musculoskeletal free of  major deformities Skin clear -no rashes Neurologic normal Lower extremities 3+ femoral and dorsalis pedis pulses palpable bilaterally with no edema  Data ordered carotid duplex exam as well as a duplex scan of her aortoiliac system and ABIs. ABIs are normal greater than 1, carotids exam reveals no evidence of stenosis left carotid endarterectomy site with a mild stenosis in the right internal carotid. Duplex scan of the aortoiliac system is unremarkable      Assessment:     Doing well post resection and grafting of abdominal aortic aneurysm 15 months ago plus previous left carotid endarterectomy-currently asymptomatic    Plan:     Return in one year for carotid duplex exam, ABIs, and duplex scan of her aortoiliac system

## 2013-04-04 NOTE — Addendum Note (Signed)
Addended by: Adria Dill L on: 04/04/2013 02:03 PM   Modules accepted: Orders

## 2013-04-17 ENCOUNTER — Ambulatory Visit
Admission: RE | Admit: 2013-04-17 | Discharge: 2013-04-17 | Disposition: A | Discharge status: Home / Self Care | Payer: Medicare Other | Source: Ambulatory Visit | Attending: Neurological Surgery | Admitting: Neurological Surgery

## 2013-04-17 DIAGNOSIS — M47816 Spondylosis without myelopathy or radiculopathy, lumbar region: Secondary | ICD-10-CM

## 2013-04-17 DIAGNOSIS — M539 Dorsopathy, unspecified: Secondary | ICD-10-CM | POA: Diagnosis not present

## 2013-04-17 DIAGNOSIS — M4310 Spondylolisthesis, site unspecified: Secondary | ICD-10-CM | POA: Diagnosis not present

## 2013-04-19 DIAGNOSIS — M479 Spondylosis, unspecified: Secondary | ICD-10-CM | POA: Diagnosis not present

## 2013-06-06 DIAGNOSIS — H52209 Unspecified astigmatism, unspecified eye: Secondary | ICD-10-CM | POA: Diagnosis not present

## 2013-06-06 DIAGNOSIS — H01009 Unspecified blepharitis unspecified eye, unspecified eyelid: Secondary | ICD-10-CM | POA: Diagnosis not present

## 2013-06-06 DIAGNOSIS — E119 Type 2 diabetes mellitus without complications: Secondary | ICD-10-CM | POA: Diagnosis not present

## 2013-06-06 DIAGNOSIS — H259 Unspecified age-related cataract: Secondary | ICD-10-CM | POA: Diagnosis not present

## 2013-06-07 DIAGNOSIS — I10 Essential (primary) hypertension: Secondary | ICD-10-CM | POA: Diagnosis not present

## 2013-06-07 DIAGNOSIS — E785 Hyperlipidemia, unspecified: Secondary | ICD-10-CM | POA: Diagnosis not present

## 2013-06-07 DIAGNOSIS — D518 Other vitamin B12 deficiency anemias: Secondary | ICD-10-CM | POA: Diagnosis not present

## 2013-06-07 DIAGNOSIS — E039 Hypothyroidism, unspecified: Secondary | ICD-10-CM | POA: Diagnosis not present

## 2013-06-07 DIAGNOSIS — E119 Type 2 diabetes mellitus without complications: Secondary | ICD-10-CM | POA: Diagnosis not present

## 2013-06-07 DIAGNOSIS — Z23 Encounter for immunization: Secondary | ICD-10-CM | POA: Diagnosis not present

## 2013-06-14 DIAGNOSIS — M48061 Spinal stenosis, lumbar region without neurogenic claudication: Secondary | ICD-10-CM | POA: Diagnosis not present

## 2013-06-14 DIAGNOSIS — M479 Spondylosis, unspecified: Secondary | ICD-10-CM | POA: Diagnosis not present

## 2013-06-20 DIAGNOSIS — H52209 Unspecified astigmatism, unspecified eye: Secondary | ICD-10-CM | POA: Diagnosis not present

## 2013-06-20 DIAGNOSIS — H25039 Anterior subcapsular polar age-related cataract, unspecified eye: Secondary | ICD-10-CM | POA: Diagnosis not present

## 2013-06-20 DIAGNOSIS — H269 Unspecified cataract: Secondary | ICD-10-CM | POA: Diagnosis not present

## 2013-06-20 DIAGNOSIS — H2510 Age-related nuclear cataract, unspecified eye: Secondary | ICD-10-CM | POA: Diagnosis not present

## 2013-06-26 ENCOUNTER — Ambulatory Visit (HOSPITAL_COMMUNITY)
Admission: RE | Admit: 2013-06-26 | Discharge: 2013-06-26 | Disposition: A | Discharge status: Home / Self Care | Payer: Medicare Other | Source: Ambulatory Visit | Attending: Neurological Surgery | Admitting: Neurological Surgery

## 2013-06-26 DIAGNOSIS — E119 Type 2 diabetes mellitus without complications: Secondary | ICD-10-CM | POA: Diagnosis not present

## 2013-06-26 DIAGNOSIS — R262 Difficulty in walking, not elsewhere classified: Secondary | ICD-10-CM | POA: Insufficient documentation

## 2013-06-26 DIAGNOSIS — I10 Essential (primary) hypertension: Secondary | ICD-10-CM | POA: Diagnosis not present

## 2013-06-26 DIAGNOSIS — IMO0001 Reserved for inherently not codable concepts without codable children: Secondary | ICD-10-CM | POA: Insufficient documentation

## 2013-06-26 DIAGNOSIS — M545 Low back pain, unspecified: Secondary | ICD-10-CM | POA: Diagnosis not present

## 2013-06-26 DIAGNOSIS — R29898 Other symptoms and signs involving the musculoskeletal system: Secondary | ICD-10-CM | POA: Insufficient documentation

## 2013-06-26 DIAGNOSIS — M79609 Pain in unspecified limb: Secondary | ICD-10-CM | POA: Diagnosis not present

## 2013-06-26 NOTE — Evaluation (Signed)
Physical Therapy Evaluation  Patient Details  Name: Katie Pace MRN: [id number] Date of Birth: 1936/03/01  Today's Date: 06/26/2013 Time: 1330-1405 PT Time Calculation (min): 35 min Charge evaluation.             Visit#: 1 of 12  Re-eval: 07/26/13 Assessment Diagnosis: lumbar surgery Surgical Date: 10/18/12 Next MD Visit: 10/18/2012 Prior Therapy: none  Authorization: medicare    Authorization Visit#: 1 of 10   Past Medical History:  Past Medical History  Diagnosis Date  . Hypertension   . Hypothyroidism   . Stroke     Ischemic x 2       last one 2007  . Diabetes mellitus     type 11  . Endocrine disturbance     gland has not  worked until 1970.Marland Kitchen..dx in 1977  . Blood transfusion     back in 1973  . GERD (gastroesophageal reflux disease)     on meds to help....meds do well   . Sheehan syndrome 1977  . AAA (abdominal aortic aneurysm)     S/p aortic repair and endarterectomy 12/2011   . Carotid stenosis, left     S/p left CEA 09/2008  . PVD (peripheral vascular disease)   . C. difficile diarrhea   . Pneumonia     hx  . Heart murmur   . Arthritis   . Anemia     hx   Past Surgical History:  Past Surgical History  Procedure Laterality Date  . Joint replacement  May 2009    Right knee  . Joint replacement  April 2008    Right Hip prothesis  . Carotid endarterectomy  Dec. 2009  . Cardiac catheterization      1980  &  2009  . Appendectomy      1960  . Cesarean section    . Back surgery      1990's  . Abdominal hysterectomy  1973    1973  . Fracture surgery      left ankle   . Abdominal aortic aneurysm repair  12/09/2011    Procedure: ANEURYSM ABDOMINAL AORTIC REPAIR;  Surgeon: Pryor Ochoa, MD;  Location: [facility] OR;  Service: Vascular;  Laterality: N/A;  resection and grafting abdominal aortic aneurysm; Insertion Bilateral External Iliac Graft; Reexploration Proximal Anastemosis; Aortic Endarterectomy; Reimplantation Inferior Mesenteric Artery; Intraaortic  Graft  . Abdominal aortic aneurysm repair    . Spine surgery  Jan. 14,2014    Lumbar Diskectomy    Subjective Symptoms/Limitations Symptoms: Katie Pace has decompression surgery wtih stabilization of L3-L5 on 10-18-2012.  She states that since the surgery she is doing worse since the surgery.  She is walking with a walker and states she is unable to walk straight without increasing her back pain.  She has B leg pain and both sides of her low back.  Rt side pain is greater than the left.  She has constant numbing and tingling in both her hands and her feet. Pertinent History: Rt TKR; Rt unipolar hip replacement; L foot fusion,  Limitations: Sitting;Lifting;Standing;Walking;House hold activities How long can you sit comfortably?: able to sit for 15 minutes. How long can you stand comfortably?: Unable to stand anytime; pt needs to hold onto items to stand How long can you walk comfortably?: Walking with a walker only able to walk for five minutes Pain Assessment Currently in Pain?: Yes Pain Score: 5  (8/10 has been the highes in the past week) Pain Location: Back Pain Orientation: Right;Left Pain  Radiating Towards: to B LE Pain Onset: More than a month ago Pain Frequency: Constant Pain Relieving Factors: medication/change position Effect of Pain on Daily Activities: increases.  Precautions/Restrictions  Precautions Precautions: None  Balance Screening Balance Screen Has the patient fallen in the past 6 months: Yes  Prior Functioning  Prior Function Driving: No Vocation: Retired Leisure: Hobbies-yes (Comment) Comments: read  Cognition/Observation Cognition Overall Cognitive Status: Within Functional Limits for tasks assessed  Sensation/Coordination/Flexibility/Functional Tests Functional Tests Functional Tests: foto   Assessment RLE Strength Right Hip Flexion: 2+/5 Right Hip Extension: 2/5 Right Hip ABduction: 3/5 Right Knee Flexion: 4/5 Right Knee Extension:  5/5 Right Ankle Dorsiflexion: 3+/5 LLE Strength Left Hip Flexion: 2+/5 Left Hip Extension: 2/5 Left Hip ABduction: 3/5 Left Knee Flexion: 3+/5 Left Knee Extension: 5/5 Left Ankle Dorsiflexion: 3/5  Exercise/Treatments Mobility/Balance  Posture/Postural Control Posture/Postural Control: Postural limitations Postural Limitations: forward bent Static Standing Balance Static Standing - Balance Support: No upper extremity supported Static Standing - Level of Assistance: 5: Stand by assistance Static Standing - Comment/# of Minutes:  (5 seconds)    Physical Therapy Assessment and Plan PT Assessment and Plan Clinical Impression Statement: Pt is a 77 yo female who has had a back fusion.  Exam reveals decreased balance, decreased strength and pain.  Pt will benefit from skilled therapy to address the forementioned deficits to maximize pt physical tolerance and improve her safety. Rehab Potential: Good PT Frequency: Min 2X/week PT Duration: 6 weeks PT Treatment/Interventions: Gait training;Therapeutic exercise;Balance training;Manual techniques;Patient/family education;Therapeutic activities    Goals Home Exercise Program Pt/caregiver will Perform Home Exercise Program: For increased strengthening PT Short Term Goals Time to Complete Short Term Goals: 3 weeks PT Short Term Goal 1: Pt to be able to sit for 30 minutes without pain to enjoy a meal. PT Short Term Goal 2: Pt to be able to stand for five minutes to be able to complete ADL without pain PT Short Term Goal 3: Pt to be able to walk with walker for 15 minutes PT Short Term Goal 4: Pt pain to be no greater than a 5 80% of the day. PT Long Term Goals Time to Complete Long Term Goals: 8 weeks PT Long Term Goal 1: I in advance HEP PT Long Term Goal 2: Pt to be able to sit for an hour without pain to be able to go out to eat. Long Term Goal 3: Pt to be able to stand for 15 minutes to make a small meal Long Term Goal 4: Pt to be able  to walk with a walker for 40 minutes without increased pain PT Long Term Goal 5: Pt pain to be no greater than a 3/10 80% of the time.   Additional PT Long Term Goals?: Yes PT Long Term Goal 6: Pt to be able to go sit to stand on one attempt and without difficulty PT Long Term Goal 7: Pt to be able to control stand to sit to not reinjure back. PT Long Term Goal 8: Pt to be able to stand for two minutes without UE support to decrease risk for falling. PT Long Term Goal 9: improve strength by one grade to allow the above to occur.  Problem List Patient Active Problem List   Diagnosis Date Noted  . Difficulty in walking(719.7) 06/26/2013  . Bilateral leg weakness 06/26/2013  . Occlusion and stenosis of carotid artery without mention of cerebral infarction 04/04/2013  . Aftercare following surgery of the circulatory system, NEC 04/04/2013  .  Anemia 10/19/2012  . Spondylolisthesis of lumbar region L3-4 L4-5 with stenosis 10/18/2012  . CKD (chronic kidney disease), stage II 10/18/2012  . C. difficile diarrhea 04/02/2012  . Acute renal failure 04/01/2012  . Encephalopathy 04/01/2012  . Sepsis 04/01/2012  . Vomiting 04/01/2012  . Hypokalemia 04/01/2012  . Chest pain 02/08/2012  . Renal insufficiency 02/08/2012  . Sheehan syndrome   . Hypertension   . Diabetes mellitus   . AAA (abdominal aortic aneurysm) 12/28/2011  . Abdominal aneurysm without mention of rupture 11/09/2011    PT - End of Session Activity Tolerance: Patient tolerated treatment well PT Plan of Care PT Home Exercise Plan: Begin stabilization exercises including bridge, bent knee raise, clams.... stretching to include  knees to chest and active hamstretch.   Assistants must have hand under back to ensure pt is not moving her back while doing exercises. PT Patient Instructions: given  GP Functional Assessment Tool Used: foto Functional Limitation: Mobility: Walking and moving around Mobility: Walking and Moving Around  Current Status [phone]): At least 60 percent but less than 80 percent impaired, limited or restricted Mobility: Walking and Moving Around Goal Status [phone]): At least 40 percent but less than 60 percent impaired, limited or restricted  Erique Kaser,CINDY 06/26/2013, 2:14 PM  Physician Documentation Your signature is required to indicate approval of the treatment plan as stated above.  Please sign and either send electronically or make a copy of this report for your files and return this physician signed original.   Please mark one 1.__approve of plan  2. ___approve of plan with the following conditions.   ______________________________                                                          _____________________ Physician Signature                                                                                                             Date

## 2013-06-28 ENCOUNTER — Ambulatory Visit (HOSPITAL_COMMUNITY)
Admission: RE | Admit: 2013-06-28 | Discharge: 2013-06-28 | Disposition: A | Discharge status: Home / Self Care | Payer: Medicare Other | Source: Ambulatory Visit | Attending: Internal Medicine | Admitting: Internal Medicine

## 2013-06-28 DIAGNOSIS — R262 Difficulty in walking, not elsewhere classified: Secondary | ICD-10-CM

## 2013-06-28 DIAGNOSIS — R29898 Other symptoms and signs involving the musculoskeletal system: Secondary | ICD-10-CM

## 2013-06-28 NOTE — Progress Notes (Signed)
Physical Therapy Treatment Patient Details  Name: Katie Pace MRN: [id number] Date of Birth: October 01, 1936  Today's Date: 06/28/2013 Time: 1355-     Visit#: 2 of 12  Re-eval: 07/26/13    Authorization: medicare  Authorization Time Period:    Authorization Visit#: 2 of 10   Subjective: Symptoms/Limitations Symptoms: Katie Pace states her back is really hurting since this morning Pain Assessment Currently in Pain?: Yes Pain Score: 7  Pain Location: Back Pain Orientation: Right;Left   Exercise/Treatments  Stretches Active Hamstring Stretch: 3 reps;30 seconds Single Knee to Chest Stretch: 3 reps;30 seconds   Supine Ab Set: 10 reps Glut Set: 10 reps Bent Knee Raise: 10 reps Bridge: 5 reps Other Supine Lumbar Exercises: adductor squeeze x 10 Other Supine Lumbar Exercises: ex 1 and 2 on deconprssion x 5     Modalities Modalities: Moist Heat Moist Heat Therapy Number Minutes Moist Heat: 20 Minutes Moist Heat Location: Other (comment) (lumbar)  Physical Therapy Assessment and Plan    Pt treatment focused on learning stabilization and reduction of pain.  Pt pain decreased 50% upon leaving therapy      Plan:  Continue with stabilization exercises.  Add the rest of decompressive exercises as well as SL exercises  Goals    Problem List Patient Active Problem List   Diagnosis Date Noted  . Difficulty in walking(719.7) 06/26/2013  . Bilateral leg weakness 06/26/2013  . Occlusion and stenosis of carotid artery without mention of cerebral infarction 04/04/2013  . Aftercare following surgery of the circulatory system, NEC 04/04/2013  . Anemia 10/19/2012  . Spondylolisthesis of lumbar region L3-4 L4-5 with stenosis 10/18/2012  . CKD (chronic kidney disease), stage II 10/18/2012  . C. difficile diarrhea 04/02/2012  . Acute renal failure 04/01/2012  . Encephalopathy 04/01/2012  . Sepsis 04/01/2012  . Vomiting 04/01/2012  . Hypokalemia 04/01/2012  . Chest pain  02/08/2012  . Renal insufficiency 02/08/2012  . Sheehan syndrome   . Hypertension   . Diabetes mellitus   . AAA (abdominal aortic aneurysm) 12/28/2011  . Abdominal aneurysm without mention of rupture 11/09/2011       GP    Lazara Grieser,CINDY 06/28/2013, 5:31 PM

## 2013-07-03 ENCOUNTER — Ambulatory Visit (HOSPITAL_COMMUNITY): Payer: Medicare Other | Admitting: Physical Therapy

## 2013-07-05 ENCOUNTER — Ambulatory Visit (HOSPITAL_COMMUNITY): Payer: Medicare Other | Admitting: Physical Therapy

## 2013-07-10 ENCOUNTER — Ambulatory Visit (HOSPITAL_COMMUNITY)
Admission: RE | Admit: 2013-07-10 | Discharge: 2013-07-10 | Disposition: A | Discharge status: Home / Self Care | Payer: Medicare Other | Source: Ambulatory Visit | Attending: Neurological Surgery | Admitting: Neurological Surgery

## 2013-07-10 DIAGNOSIS — M545 Low back pain, unspecified: Secondary | ICD-10-CM | POA: Insufficient documentation

## 2013-07-10 DIAGNOSIS — I10 Essential (primary) hypertension: Secondary | ICD-10-CM | POA: Diagnosis not present

## 2013-07-10 DIAGNOSIS — R29898 Other symptoms and signs involving the musculoskeletal system: Secondary | ICD-10-CM | POA: Diagnosis not present

## 2013-07-10 DIAGNOSIS — R262 Difficulty in walking, not elsewhere classified: Secondary | ICD-10-CM | POA: Insufficient documentation

## 2013-07-10 DIAGNOSIS — IMO0001 Reserved for inherently not codable concepts without codable children: Secondary | ICD-10-CM | POA: Diagnosis not present

## 2013-07-10 DIAGNOSIS — E119 Type 2 diabetes mellitus without complications: Secondary | ICD-10-CM | POA: Insufficient documentation

## 2013-07-10 DIAGNOSIS — M79609 Pain in unspecified limb: Secondary | ICD-10-CM | POA: Diagnosis not present

## 2013-07-10 NOTE — Progress Notes (Signed)
Physical Therapy Treatment Patient Details  Name: Katie Pace MRN: [id number] Date of Birth: 08/20/1936  Today's Date: 07/10/2013 Time: 3086-5784 PT Time Calculation (min): 47 min Charges: Therex x 32'(1350-1322) Manual x 10'(1325-1335)  Visit#: 3 of 12  Re-eval: 07/26/13  Authorization: medicare  Authorization Visit#: 3 of 10   Subjective: Symptoms/Limitations Symptoms: Pt states that she has been completing her exercises at home.  Pain Assessment Currently in Pain?: Yes Pain Score: 7  Pain Location: Back Pain Orientation: Right;Left  Precautions/Restrictions     Exercise/Treatments Stretches Active Hamstring Stretch: 3 reps;30 seconds Single Knee to Chest Stretch: 3 reps;30 seconds Supine Ab Set: 10 reps;5 seconds Glut Set: 10 reps;5 seconds Bent Knee Raise: 10 reps Bridge: 10 reps Other Supine Lumbar Exercises:  (time) Other Supine Lumbar Exercises:  (time)  Modalities Modalities: Moist Heat Manual Therapy Manual Therapy: Myofascial release Myofascial Release: Gentle MFR to lumbar to decrease adhesions and pain  Moist Heat Therapy Moist Heat Location: Other (comment) (lumbar)  Physical Therapy Assessment and Plan PT Assessment and Plan Clinical Impression Statement: Pt requires multimodal cueing to facilitate core control with stabilization exercises. Pt is most limited by pain in right lumbar/posterior hip. Encouraged stretching before bed and first thing in the morning to decrease pain and tightness. Gentle MFR completed to lumbar to decrease adhesions and pain. Pt reports pain decrease to 3-4/10 at end of session. PT Plan: Continue to progress core strength and flexibility/mobility. Progress to side lying exercises next session.    Goals    Problem List Patient Active Problem List   Diagnosis Date Noted  . Difficulty in walking(719.7) 06/26/2013  . Bilateral leg weakness 06/26/2013  . Occlusion and stenosis of carotid artery without mention of  cerebral infarction 04/04/2013  . Aftercare following surgery of the circulatory system, NEC 04/04/2013  . Anemia 10/19/2012  . Spondylolisthesis of lumbar region L3-4 L4-5 with stenosis 10/18/2012  . CKD (chronic kidney disease), stage II 10/18/2012  . C. difficile diarrhea 04/02/2012  . Acute renal failure 04/01/2012  . Encephalopathy 04/01/2012  . Sepsis 04/01/2012  . Vomiting 04/01/2012  . Hypokalemia 04/01/2012  . Chest pain 02/08/2012  . Renal insufficiency 02/08/2012  . Sheehan syndrome   . Hypertension   . Diabetes mellitus   . AAA (abdominal aortic aneurysm) 12/28/2011  . Abdominal aneurysm without mention of rupture 11/09/2011    PT - End of Session Activity Tolerance: Patient tolerated treatment well General Behavior During Therapy: [facility] for tasks assessed/performed  Seth Bake, PTA  07/10/2013, 3:53 PM

## 2013-07-12 ENCOUNTER — Ambulatory Visit (HOSPITAL_COMMUNITY)
Admission: RE | Admit: 2013-07-12 | Discharge: 2013-07-12 | Disposition: A | Discharge status: Home / Self Care | Payer: Medicare Other | Source: Ambulatory Visit | Attending: Internal Medicine | Admitting: Internal Medicine

## 2013-07-12 ENCOUNTER — Encounter (INDEPENDENT_AMBULATORY_CARE_PROVIDER_SITE_OTHER): Payer: Self-pay

## 2013-07-12 NOTE — Progress Notes (Signed)
Physical Therapy Treatment Patient Details  Name: Katie Pace MRN: [id number] Date of Birth: 1936-05-24  Today's Date: 07/12/2013 Time: 4098-1191 PT Time Calculation (min): 42 min Charges: Therex x 38' MHP x 1 (with therex)  Visit#: 4 of 12  Re-eval: 07/26/13  Authorization: medicare   Authorization Visit#: 4 of 10   Subjective: Symptoms/Limitations Symptoms: Pt states that heat seems to help relieve pain. Pain Assessment Currently in Pain?: Yes Pain Score: 6  Pain Location: Back Pain Orientation: Right;Left   Exercise/Treatments Supine Ab Set: 10 reps;5 seconds Bent Knee Raise: 10 reps Bridge: 10 reps Other Supine Lumbar Exercises: adductor squeeze x 10 Other Supine Lumbar Exercises: Decompression exercises 1-5  Modalities Modalities: Moist Heat Moist Heat Therapy Number Minutes Moist Heat:  (During supine therex) Moist Heat Location: Other (comment) (lumbar)  Physical Therapy Assessment and Plan PT Assessment and Plan Clinical Impression Statement: Pt completes therex well after initial cueing and demo. Pt continues to require multimodal to correctly complete abdominal contraction. Therex completed on lumbar MHP to decrease tightness and pain. Pt reports no change in pain at end of session. PT Plan: Continue to progress core strength and flexibility/mobility. Progress to side lying exercises next session.    Goals    Problem List Patient Active Problem List   Diagnosis Date Noted  . Difficulty in walking(719.7) 06/26/2013  . Bilateral leg weakness 06/26/2013  . Occlusion and stenosis of carotid artery without mention of cerebral infarction 04/04/2013  . Aftercare following surgery of the circulatory system, NEC 04/04/2013  . Anemia 10/19/2012  . Spondylolisthesis of lumbar region L3-4 L4-5 with stenosis 10/18/2012  . CKD (chronic kidney disease), stage II 10/18/2012  . C. difficile diarrhea 04/02/2012  . Acute renal failure 04/01/2012  . Encephalopathy  04/01/2012  . Sepsis 04/01/2012  . Vomiting 04/01/2012  . Hypokalemia 04/01/2012  . Chest pain 02/08/2012  . Renal insufficiency 02/08/2012  . Sheehan syndrome   . Hypertension   . Diabetes mellitus   . AAA (abdominal aortic aneurysm) 12/28/2011  . Abdominal aneurysm without mention of rupture 11/09/2011    PT - End of Session Activity Tolerance: Patient tolerated treatment well General Behavior During Therapy: [facility] for tasks assessed/performed  Seth Bake, PTA  07/12/2013, 3:49 PM

## 2013-07-17 ENCOUNTER — Ambulatory Visit (HOSPITAL_COMMUNITY)
Admission: RE | Admit: 2013-07-17 | Discharge: 2013-07-17 | Disposition: A | Discharge status: Home / Self Care | Payer: Medicare Other | Source: Ambulatory Visit | Attending: Internal Medicine | Admitting: Internal Medicine

## 2013-07-17 NOTE — Progress Notes (Signed)
Physical Therapy Treatment Patient Details  Name: Katie Pace MRN: [id number] Date of Birth: [birth year]  Today's Date: 07/17/2013 Time: 4098-1191 PT Time Calculation (min): 45 min  Visit#: 5 of 12  Re-eval: 07/26/13 Authorization: medicare  Authorization Visit#: 5 of 10  Charges:  therex 30' (1400-1430), MHP (during therex and 5' prior), self care 10' (1348-1358)  Subjective: Symptoms/Limitations Symptoms: Pt states her back continues to hurt badly.  Pt requested MHP prior to beginning thereapy today.  Pain in bilateral lumbar into gluteal folds. Pain Assessment Currently in Pain?: Yes Pain Score: 8  Pain Location: Back Pain Orientation: Lower;Mid Pain Radiating Towards: bilatateral gluteal folds   Exercise/Treatments Stretches Passive Hamstring Stretch: 2 reps;60 seconds Single Knee to Chest Stretch: 3 reps;30 seconds Supine Bent Knee Raise: 10 reps Bridge: 10 reps Straight Leg Raise: 5 reps Other Supine Lumbar Exercises: adductor squeeze x 10 Other Supine Lumbar Exercises: Decompression exercises 1-5    Modalities Modalities: Moist Heat Moist Heat Therapy Moist Heat Location: Other (comment) (lumbar)  Physical Therapy Assessment and Plan PT Assessment and Plan Pt Assessment:  Pt continues to have lumbar pain.  MHP  And therex helped to decrease pain from 8/10 upon entering to 4/10 when leaving clinic at end of session.  Pt encouraged to improve posture and discontinue bending forward with ambulation and transfers.   PT Plan: Continue to progress core strength and flexibility/mobility. Progress to side lying exercises next session.     Problem List Patient Active Problem List   Diagnosis Date Noted  . Difficulty in walking(719.7) 06/26/2013  . Bilateral leg weakness 06/26/2013  . Occlusion and stenosis of carotid artery without mention of cerebral infarction 04/04/2013  . Aftercare following surgery of the circulatory system, NEC 04/04/2013  . Anemia  10/19/2012  . Spondylolisthesis of lumbar region L3-4 L4-5 with stenosis 10/18/2012  . CKD (chronic kidney disease), stage II 10/18/2012  . C. difficile diarrhea 04/02/2012  . Acute renal failure 04/01/2012  . Encephalopathy 04/01/2012  . Sepsis 04/01/2012  . Vomiting 04/01/2012  . Hypokalemia 04/01/2012  . Chest pain 02/08/2012  . Renal insufficiency 02/08/2012  . Sheehan syndrome   . Hypertension   . Diabetes mellitus   . AAA (abdominal aortic aneurysm) 12/28/2011  . Abdominal aneurysm without mention of rupture 11/09/2011    PT - End of Session Activity Tolerance: Patient tolerated treatment well General Behavior During Therapy: [facility] for tasks assessed/performed   Emeline Gins B 07/17/2013, 2:35 PM

## 2013-07-18 DIAGNOSIS — H2510 Age-related nuclear cataract, unspecified eye: Secondary | ICD-10-CM | POA: Diagnosis not present

## 2013-07-18 DIAGNOSIS — H52209 Unspecified astigmatism, unspecified eye: Secondary | ICD-10-CM | POA: Diagnosis not present

## 2013-07-18 DIAGNOSIS — H25039 Anterior subcapsular polar age-related cataract, unspecified eye: Secondary | ICD-10-CM | POA: Diagnosis not present

## 2013-07-18 DIAGNOSIS — H269 Unspecified cataract: Secondary | ICD-10-CM | POA: Diagnosis not present

## 2013-07-19 ENCOUNTER — Ambulatory Visit (HOSPITAL_COMMUNITY): Payer: Medicare Other | Admitting: Physical Therapy

## 2013-07-21 ENCOUNTER — Ambulatory Visit (HOSPITAL_COMMUNITY)
Admission: RE | Admit: 2013-07-21 | Discharge: 2013-07-21 | Disposition: A | Discharge status: Home / Self Care | Payer: Medicare Other | Source: Ambulatory Visit | Attending: Internal Medicine | Admitting: Internal Medicine

## 2013-07-21 DIAGNOSIS — R262 Difficulty in walking, not elsewhere classified: Secondary | ICD-10-CM

## 2013-07-21 DIAGNOSIS — R29898 Other symptoms and signs involving the musculoskeletal system: Secondary | ICD-10-CM

## 2013-07-21 NOTE — Progress Notes (Signed)
Physical Therapy Treatment Patient Details  Name: Katie Pace MRN: [id number] Date of Birth: [birth year]  Today's Date: 07/21/2013 Time: 4098-1191 PT Time Calculation (min): 47 min Charges: 2' TE Visit#: 6 of 12  Re-eval: 07/26/13    Authorization: medicare  Authorization Time Period:    Authorization Visit#: 6 of 10   Subjective: Symptoms/Limitations Symptoms: Pt reports that she has had a lot of pain to her back.  Pain Assessment Currently in Pain?: Yes Pain Score: 8  Pain Location: Back Pain Orientation: Lower;Mid  Precautions/Restrictions     Exercise/Treatments Stretches Passive Hamstring Stretch: 2 reps;60 seconds;Limitations Passive Hamstring Stretch Limitations: BLE Lower Trunk Rotation: 5 reps Seated Long Arc Quad on Chair: Both;10 reps;Weights LAQ on Chair Weights (lbs): 2 Other Seated Lumbar Exercises: Hamstring Curl 10 reps red t-band  Supine Bridge: 10 reps;Limitations Bridge Limitations: w/isometic hip adduction  Other Supine Lumbar Exercises: isometric adductor squeeze 5x5 sec holds/ isometric hip abduction 5x5 sec holds Sidelying Hip Abduction: 10 reps;Limitations Hip Abduction Limitations: AAROM 2x5 BLE Other Sidelying Lumbar Exercises: Hip Extension: AAROM x10 reps (decreased pain  Physical Therapy Assessment and Plan PT Assessment and Plan Clinical Impression Statement: Pt has relief with sidelying AAROM hip extension.  Has tremendous pain with intermittent flaling of arms and yelling with attempted rolling and with bridiging activities.  Educated pt and her husband to continue with exercises to improve strength as her pain level has not changed in the past year. Updated HEP with s/l activities.  PT Plan: Continue to progress core strength and flexibility/mobility.Progress towards seated and postural strengthening.     Goals    Problem List Patient Active Problem List   Diagnosis Date Noted  . Difficulty in walking(719.7) 06/26/2013  .  Bilateral leg weakness 06/26/2013  . Occlusion and stenosis of carotid artery without mention of cerebral infarction 04/04/2013  . Aftercare following surgery of the circulatory system, NEC 04/04/2013  . Anemia 10/19/2012  . Spondylolisthesis of lumbar region L3-4 L4-5 with stenosis 10/18/2012  . CKD (chronic kidney disease), stage II 10/18/2012  . C. difficile diarrhea 04/02/2012  . Acute renal failure 04/01/2012  . Encephalopathy 04/01/2012  . Sepsis 04/01/2012  . Vomiting 04/01/2012  . Hypokalemia 04/01/2012  . Chest pain 02/08/2012  . Renal insufficiency 02/08/2012  . Sheehan syndrome   . Hypertension   . Diabetes mellitus   . AAA (abdominal aortic aneurysm) 12/28/2011  . Abdominal aneurysm without mention of rupture 11/09/2011    PT - End of Session Activity Tolerance: Patient tolerated treatment well General Behavior During Therapy: [facility] for tasks assessed/performed  GP    Jomarie Gellis, MPT, ATC 07/21/2013, 3:55 PM

## 2013-07-25 ENCOUNTER — Ambulatory Visit (HOSPITAL_COMMUNITY)
Admission: RE | Admit: 2013-07-25 | Discharge: 2013-07-25 | Disposition: A | Discharge status: Home / Self Care | Payer: Medicare Other | Source: Ambulatory Visit | Attending: Internal Medicine | Admitting: Internal Medicine

## 2013-07-25 NOTE — Progress Notes (Addendum)
Physical Therapy Treatment Patient Details  Name: Katie Pace MRN: [id number] Date of Birth: [birth year]  Today's Date: 07/25/2013 Time: 4098-1191 PT Time Calculation (min): 52 min Charges: Therex x 23'(1520-1543)US x 4'(7829-5621) Self care x 12'(1555-1507)   Visit#: 7 of 12  Re-eval: 07/26/13  Authorization: medicare  Authorization Visit#: 7 of 10   Subjective: Symptoms/Limitations Symptoms: Pt states that her pain seems to be getting worse. Pain Assessment Currently in Pain?: Yes Pain Score: 8  Pain Location: Back Pain Orientation: Lower  Exercise/Treatments Supine Ab Set: 5 reps;5 seconds Other Supine Lumbar Exercises: Glute set x10 Other Supine Lumbar Exercises: add/abb isometric x10 bilateral  Modalities Modalities: Ultrasound Ultrasound Ultrasound Location: Right SI/gluteal region Ultrasound Parameters: @1 .2 x 8' Ultrasound Goals: Pain  Physical Therapy Assessment and Plan PT Assessment and Plan Clinical Impression Statement: Pt is severely limited by pain. Pt reports increased pain with all exercises. Completed ultrasound to right SI/gluteal area to decrease pain. At end of session pt states that she is in so much pain that she cannot walk. Pt was taken to car in wheel chair.  Suggested that pt contact MD secondary to pain. PT Plan: Reassess next session.  Problem List Patient Active Problem List   Diagnosis Date Noted  . Difficulty in walking(719.7) 06/26/2013  . Bilateral leg weakness 06/26/2013  . Occlusion and stenosis of carotid artery without mention of cerebral infarction 04/04/2013  . Aftercare following surgery of the circulatory system, NEC 04/04/2013  . Anemia 10/19/2012  . Spondylolisthesis of lumbar region L3-4 L4-5 with stenosis 10/18/2012  . CKD (chronic kidney disease), stage II 10/18/2012  . C. difficile diarrhea 04/02/2012  . Acute renal failure 04/01/2012  . Encephalopathy 04/01/2012  . Sepsis 04/01/2012  . Vomiting  04/01/2012  . Hypokalemia 04/01/2012  . Chest pain 02/08/2012  . Renal insufficiency 02/08/2012  . Sheehan syndrome   . Hypertension   . Diabetes mellitus   . AAA (abdominal aortic aneurysm) 12/28/2011  . Abdominal aneurysm without mention of rupture 11/09/2011    PT - End of Session Activity Tolerance: Patient tolerated treatment well General Behavior During Therapy: [facility] for tasks assessed/performed  Seth Bake, PTA  07/25/2013, 5:56 PM

## 2013-07-26 DIAGNOSIS — R319 Hematuria, unspecified: Secondary | ICD-10-CM | POA: Diagnosis not present

## 2013-07-27 ENCOUNTER — Ambulatory Visit (HOSPITAL_COMMUNITY)
Admission: RE | Admit: 2013-07-27 | Discharge: 2013-07-27 | Disposition: A | Discharge status: Home / Self Care | Payer: Medicare Other | Source: Ambulatory Visit | Attending: *Deleted | Admitting: *Deleted

## 2013-07-27 DIAGNOSIS — R262 Difficulty in walking, not elsewhere classified: Secondary | ICD-10-CM

## 2013-07-27 DIAGNOSIS — R29898 Other symptoms and signs involving the musculoskeletal system: Secondary | ICD-10-CM

## 2013-07-27 NOTE — Progress Notes (Signed)
Physical Therapy Treatment Patient Details  Name: Katie Pace MRN: [id number] Date of Birth: 20-Jul-1936  Today's Date: 07/27/2013 Time: 4098-1191 PT Time Calculation (min): 50 min Charge Self care 1345-1355; there ex 1355-1413;massage 4782-9562 Visit#: 8 of 12  Re-eval: 08/03/13    Authorization: medicare    Authorization Visit#: 8 of 10   Subjective: Symptoms/Limitations Symptoms: Pt states that she is really hurting.  Husband states patient is doing exercises today. Pain Assessment Currently in Pain?: Yes Pain Score: 9  Pain Location: Back Pain Orientation: Right Pain Type: Chronic pain Pain Onset: More than a month ago Pain Frequency: Constant Exercise/Treatments   Supine Other Supine Lumbar Exercises: decompressive exercises 1-5  Manual Therapy Manual Therapy: Massage Massage: to B lumbar paraspinal mm while pt was Rt sidelying;  mm tight but no spasms  Physical Therapy Assessment and Plan PT Assessment and Plan Clinical Impression Statement: Pt walking in a forward bent position.  Raised walker and educated pt in the importance of ambulating in correct posture.  Pt stated she was not educated in proper way to come from sit to supine and back to sit.  Educated pt in this.  Due to high pain therapist attempted decompression ex and explaiined pt to do only these until the next time pt returns for visit.  Pt states [place] explained to pt that if she is in that much pain she may want to see her MD and not go to the mountains..  Pt ambulating straighter at the end of session.    Goals  not progressing pt urged to see MD  Problem List Patient Active Problem List   Diagnosis Date Noted  . Difficulty in walking(719.7) 06/26/2013  . Bilateral leg weakness 06/26/2013  . Occlusion and stenosis of carotid artery without mention of cerebral infarction 04/04/2013  . Aftercare following surgery of the circulatory system, NEC 04/04/2013  . Anemia  10/19/2012  . Spondylolisthesis of lumbar region L3-4 L4-5 with stenosis 10/18/2012  . CKD (chronic kidney disease), stage II 10/18/2012  . C. difficile diarrhea 04/02/2012  . Acute renal failure 04/01/2012  . Encephalopathy 04/01/2012  . Sepsis 04/01/2012  . Vomiting 04/01/2012  . Hypokalemia 04/01/2012  . Chest pain 02/08/2012  . Renal insufficiency 02/08/2012  . Sheehan syndrome   . Hypertension   . Diabetes mellitus   . AAA (abdominal aortic aneurysm) 12/28/2011  . Abdominal aneurysm without mention of rupture 11/09/2011    PT - End of Session Activity Tolerance: Patient tolerated treatment well General Behavior During Therapy: [facility] for tasks assessed/performed PT Plan of Care PT Home Exercise Plan: assess how decompression exercises did for pt ; may add t-band decompression ex.  Pt will need reassess and g-code in two visits.  GP    RUSSELL,CINDY 07/27/2013, 4:52 PM

## 2013-07-31 ENCOUNTER — Ambulatory Visit (HOSPITAL_COMMUNITY)
Admission: RE | Admit: 2013-07-31 | Discharge: 2013-07-31 | Disposition: A | Discharge status: Home / Self Care | Payer: Medicare Other | Source: Ambulatory Visit | Attending: Internal Medicine | Admitting: Internal Medicine

## 2013-07-31 NOTE — Progress Notes (Signed)
Physical Therapy Treatment Patient Details  Name: Katie Pace MRN: [id number] Date of Birth: [birth year]  Today's Date: 07/31/2013 Time: 4098-1191 PT Time Calculation (min): 46 min Visit#: 9 of 12  Authorization: medicare  Charges:  therex 1430-1500 (30'), [place] 1500-1510 (8')   Subjective: Symptoms/Limitations Symptoms: Pt states she feels a little better today than last visit, however took a pain pill prior to session to help her get through. Pain Assessment Currently in Pain?: Yes Pain Score: 7  Pain Location: Back Pain Orientation: Right   Exercise/Treatments Seated Other Seated Lumbar Exercises: hip roll ins/outs 10X5" Supine Other Supine Lumbar Exercises: decompressive exercises 1-5    Modalities Modalities: Ultrasound Ultrasound Ultrasound Location: Right SI/gluteal region in Lt sidelying Ultrasound Parameters: 1 MHz with 1.4w/cm2 X 8 minutes Ultrasound Goals: Pain Weight Bearing Technique Weight Bearing Technique: No  Physical Therapy Assessment and Plan PT Assessment and Plan Clinical Impression Statement: Improved gait using RW, however continues to ambulate behind BOS, encouraged to walk into walker to improve posture.  Pt reports she has been compliant with new therex instructed by previous therapist but only did 2X day instead of instruced 3X day.  completed session with [place] as pt reported more relief from that vs massage. PT Plan: Add theraband decompression exercises.  Reassess next visit.     Problem List Patient Active Problem List   Diagnosis Date Noted  . Difficulty in walking(719.7) 06/26/2013  . Bilateral leg weakness 06/26/2013  . Occlusion and stenosis of carotid artery without mention of cerebral infarction 04/04/2013  . Aftercare following surgery of the circulatory system, NEC 04/04/2013  . Anemia 10/19/2012  . Spondylolisthesis of lumbar region L3-4 L4-5 with stenosis 10/18/2012  . CKD (chronic kidney disease), stage II 10/18/2012  . C.  difficile diarrhea 04/02/2012  . Acute renal failure 04/01/2012  . Encephalopathy 04/01/2012  . Sepsis 04/01/2012  . Vomiting 04/01/2012  . Hypokalemia 04/01/2012  . Chest pain 02/08/2012  . Renal insufficiency 02/08/2012  . Sheehan syndrome   . Hypertension   . Diabetes mellitus   . AAA (abdominal aortic aneurysm) 12/28/2011  . Abdominal aneurysm without mention of rupture 11/09/2011       Lurena Nida, PTA/CLT 07/31/2013, 3:26 PM

## 2013-08-02 ENCOUNTER — Ambulatory Visit (HOSPITAL_COMMUNITY)
Admission: RE | Admit: 2013-08-02 | Discharge: 2013-08-02 | Disposition: A | Discharge status: Home / Self Care | Payer: Medicare Other | Source: Ambulatory Visit | Attending: Internal Medicine | Admitting: Internal Medicine

## 2013-08-02 NOTE — Progress Notes (Signed)
Physical Therapy Re-evaluation  Patient Details  Name: Katie Pace MRN: [id number] Date of Birth: 1935-11-21  Today's Date: 08/02/2013 Time: 4098-1191 PT Time Calculation (min): 113 min              Visit#: 10 of 12  Re-eval: 08/03/13 Diagnosis: lumbar surgery Surgical Date: 10/18/12 Next MD Visit: Dr. Danielle Dess; January 2015 Authorization: medicare    Charges:  therex 1432-1500 (28'), MMT 1500-1510 (10'), [place] 1512-1520 (8'), self care 1522-1540 (18')  Subjective Symptoms/Limitations Symptoms: Pt states she did not take a pain pill prior to therapy today and her pain currently 6/10.  States she has been to vote today, went out to lunch and rode with husband to run errands.  State she feels she is overall getting better. Pain Assessment Currently in Pain?: Yes Pain Score: 6  Pain Location: Back   Objective: RLE Strength Right Hip Flexion: 3+/5  Was 2+/5 Right Hip Extension: 3-/5 was 2/5 Right Hip ABduction: 4/5 was 3/5 Right Knee Flexion: 2-/5* was 4/5 Right Knee Extension: 5/5 was 5/5 Right Ankle Dorsiflexion: 4/5 was 3+/5  LLE Strength Left Hip Flexion: 4/5 was 2+/5 Left Hip Extension: 3-/5 was 2/5 Left Hip ABduction: 4/5 was 3/5 Left Knee Flexion: 1/5* was 3+/5 Left Knee Extension: 5/5 was 5/5 Left Ankle Dorsiflexion: 4/5 was 3/5 * mm test may not be accurate as pt was complaining of significant pain from OA with motion. Exercise/Treatments eated Other Seated Lumbar Exercises: hip roll ins/outs 10X5" Supine Other Supine Lumbar Exercises: decompressive exercises 1-5 Other Supine Lumbar Exercises: decompression exercises with green theraband 5 reps each    Modalities Modalities: Ultrasound Ultrasound Ultrasound Location: Rt SI/gluteal region with pt in Lt sidelying Ultrasound Parameters: 1 MHz with 1.4 w/cm2 X 8 minutes Ultrasound Goals: Pain  Physical Therapy Assessment and Plan PT Assessment and Plan PT Assessment:  Pt has completed 10/12 PT visits for  her Lumbar discomfort.  Pt has overall increased Bilateral LE strength and decreased pain as compared to initial evaluation on 06/26/13.  Pt has met 2/4 STG's and 2/9 LTG's.  She is progressing well toward all other goals.  She has improved gait quality and posture, however continues to have difficulty with transitional movements and activity tolerance.   Pt would benefit from continued skilled therapy to address these functional deficits.   PT Plan: Recommend continuing therapy 3X week for 4 more weeks.   Pt HEP:  Progressed with additional decompression exercises using theraband.  Pt given written instructions and green theraband for HEP.  Goals Home Exercise Program Pt/caregiver will Perform Home Exercise Program: For increased strengthening PT Goal: Perform Home Exercise Program - Progress: Partly met  PT Short Term Goals Time to Complete Short Term Goals: 3 weeks PT Short Term Goal 1: Pt to be able to sit for 30 minutes without pain to enjoy a meal.- Progress: Met PT Short Term Goal 2: Pt to be able to stand for five minutes to be able to complete ADL without pain- Progress: Progressing toward goal PT Short Term Goal 3: Pt to be able to walk with walker for 15 minutes - Progress: Met PT Short Term Goal 4: Pt pain to be no greater than a 5 80% of the day.- Progress: Progressing toward goal  PT Long Term Goals Time to Complete Long Term Goals: 8 weeks PT Long Term Goal 1: I in advance HEP- Progress: Not met (has not been advanced yet) PT Long Term Goal 2: Pt to be able to sit for  an hour without pain to be able to go out to eat. - Progress: Not met Long Term Goal 3: Pt to be able to stand for 15 minutes to make a small meal-Progress: Not met Long Term Goal 4: Pt to be able to walk with a walker for 40 minutes without increased pain- Progress: Not met PT Long Term Goal 5: Pt pain to be no greater than a 3/10 80% of the time.- Progress:  Not met PT Long Term Goal 6: Pt to be able to go sit to  stand on one attempt and without difficulty -Progress: Partly met PT Long Term Goal 7: Pt to be able to control stand to sit to not reinjure back.-Progress: Met PT Long Term Goal 8: Pt to be able to stand for two minutes without UE support to decrease risk for falling.- Progress: Progressing toward goal PT Long Term Goal 9: improve strength by one grade to allow the above to occur.- Partly Met  Problem List Patient Active Problem List   Diagnosis Date Noted  . Difficulty in walking(719.7) 06/26/2013  . Bilateral leg weakness 06/26/2013  . Occlusion and stenosis of carotid artery without mention of cerebral infarction 04/04/2013  . Aftercare following surgery of the circulatory system, NEC 04/04/2013  . Anemia 10/19/2012  . Spondylolisthesis of lumbar region L3-4 L4-5 with stenosis 10/18/2012  . CKD (chronic kidney disease), stage II 10/18/2012  . C. difficile diarrhea 04/02/2012  . Acute renal failure 04/01/2012  . Encephalopathy 04/01/2012  . Sepsis 04/01/2012  . Vomiting 04/01/2012  . Hypokalemia 04/01/2012  . Chest pain 02/08/2012  . Renal insufficiency 02/08/2012  . Sheehan syndrome   . Hypertension   . Diabetes mellitus   . AAA (abdominal aortic aneurysm) 12/28/2011  . Abdominal aneurysm without mention of rupture 11/09/2011       GP Functional Assessment Tool Used: FOTO:  FS score 27 (was 21), disability 73% (was 79%). Mobility: Walking and Moving Around Current Status [phone]): At least 60 percent but less than 80 percent impaired, limited or restricted Mobility: Walking and Moving Around Goal Status [phone]): At least 40 percent but less than 60 percent impaired, limited or restricted  Lurena Nida, PTA/CLT 08/02/2013, 4:13 PM

## 2013-08-04 ENCOUNTER — Ambulatory Visit (HOSPITAL_COMMUNITY)
Admission: RE | Admit: 2013-08-04 | Discharge: 2013-08-04 | Disposition: A | Discharge status: Home / Self Care | Payer: Medicare Other | Source: Ambulatory Visit | Attending: Internal Medicine | Admitting: Internal Medicine

## 2013-08-04 DIAGNOSIS — R29898 Other symptoms and signs involving the musculoskeletal system: Secondary | ICD-10-CM

## 2013-08-04 DIAGNOSIS — R262 Difficulty in walking, not elsewhere classified: Secondary | ICD-10-CM

## 2013-08-04 NOTE — Progress Notes (Signed)
Physical Therapy Treatment Patient Details  Name: Katie Pace MRN: [id number] Date of Birth: 1936/09/11  Today's Date: 08/04/2013 Time: 1308-6578 PT Time Calculation (min): 45 min  Visit#: 11 of 11  Re-eval: 08/03/13   Authorization: medicare      Authorization Visit#: 11 of 20   Subjective: Symptoms/Limitations Symptoms: Pt states that the pain is about the same but she can tell that her function is better.    Exercise/Treatments     Stretches Active Hamstring Stretch: 2 reps;30 seconds Single Knee to Chest Stretch: 2 reps;30 seconds Aerobic Stationary Bike: Nu-step x 7' L2 hills Supine Bridge: 5 reps Other Supine Lumbar Exercises: hip adduction. Sidelying Clam: 10 reps Hip Abduction: 10 reps Prone  Straight Leg Raise: 5 reps Other Prone Lumbar Exercises: knee flexion pain free only x 10  Physical Therapy Assessment and Plan PT Assessment and Plan Clinical Impression Statement: Pt strength improving as well as functional mobiility.  Pt states she can tell getting in and out of bed as well as rolling over has improved.  Pt has severe OA of Lt knee which limits strengthening of Lt knee to painfree range. PT Plan: Continue to work on stability slowly work into standing activites    Goals  progressing   Problem List Patient Active Problem List   Diagnosis Date Noted  . Difficulty in walking(719.7) 06/26/2013  . Bilateral leg weakness 06/26/2013  . Occlusion and stenosis of carotid artery without mention of cerebral infarction 04/04/2013  . Aftercare following surgery of the circulatory system, NEC 04/04/2013  . Anemia 10/19/2012  . Spondylolisthesis of lumbar region L3-4 L4-5 with stenosis 10/18/2012  . CKD (chronic kidney disease), stage II 10/18/2012  . C. difficile diarrhea 04/02/2012  . Acute renal failure 04/01/2012  . Encephalopathy 04/01/2012  . Sepsis 04/01/2012  . Vomiting 04/01/2012  . Hypokalemia 04/01/2012  . Chest pain 02/08/2012  . Renal  insufficiency 02/08/2012  . Sheehan syndrome   . Hypertension   . Diabetes mellitus   . AAA (abdominal aortic aneurysm) 12/28/2011  . Abdominal aneurysm without mention of rupture 11/09/2011    PT - End of Session Activity Tolerance: Patient tolerated treatment well General Behavior During Therapy: WFL for tasks assessed/performed PT Plan of Care PT Home Exercise Plan: continue to progress stability of spine.  GP    Irianna Gilday,CINDY 08/04/2013, 2:35 PM

## 2013-08-07 ENCOUNTER — Ambulatory Visit (HOSPITAL_COMMUNITY)
Admission: RE | Admit: 2013-08-07 | Discharge: 2013-08-07 | Disposition: A | Discharge status: Home / Self Care | Payer: Medicare Other | Source: Ambulatory Visit | Attending: Neurological Surgery | Admitting: Neurological Surgery

## 2013-08-07 DIAGNOSIS — R29898 Other symptoms and signs involving the musculoskeletal system: Secondary | ICD-10-CM | POA: Diagnosis not present

## 2013-08-07 DIAGNOSIS — M79609 Pain in unspecified limb: Secondary | ICD-10-CM | POA: Diagnosis not present

## 2013-08-07 DIAGNOSIS — M545 Low back pain, unspecified: Secondary | ICD-10-CM | POA: Diagnosis not present

## 2013-08-07 DIAGNOSIS — E119 Type 2 diabetes mellitus without complications: Secondary | ICD-10-CM | POA: Insufficient documentation

## 2013-08-07 DIAGNOSIS — IMO0001 Reserved for inherently not codable concepts without codable children: Secondary | ICD-10-CM | POA: Diagnosis not present

## 2013-08-07 DIAGNOSIS — R262 Difficulty in walking, not elsewhere classified: Secondary | ICD-10-CM | POA: Diagnosis not present

## 2013-08-07 DIAGNOSIS — I10 Essential (primary) hypertension: Secondary | ICD-10-CM | POA: Diagnosis not present

## 2013-08-07 NOTE — Progress Notes (Signed)
Physical Therapy Treatment Patient Details  Name: Katie Pace MRN: [id number] Date of Birth: 1936/02/14  Today's Date: 08/07/2013 Time: 9147-8295 PT Time Calculation (min): 50 min  Visit#: 12 of 24  Re-eval: 08/03/13 Authorization: medicare  Authorization Visit#: 11 of 20  Charges:  therex 45'  Subjective: Symptoms/Limitations Symptoms: Pt states she felt great after last session and then her pain began increase into the night.  STates it is hight today. Pain Assessment Currently in Pain?: Yes Pain Score: 7  Pain Location: Back Pain Orientation: Upper;Mid;Lower;Right   Exercise/Treatments Aerobic Stationary Bike: Nu-step x 8' L2 hills Supine Bridge: 10 reps Other Supine Lumbar Exercises:  hip adduction with ball 10X5" Other Supine Lumbar Exercises: decompression exercises with green theraband 5 reps each Sidelying Clam: 10 reps Hip Abduction: 10 reps Prone  Straight Leg Raise: 15 reps;Limitations Straight Leg Raises Limitations: bilaterally Other Prone Lumbar Exercises: knee flexion pain free only x 15 bilateral    Physical Therapy Assessment and Plan PT Assessment and Plan Clinical Impression Statement: Reviewed decompression theraband exercises with multimodal cuing. Progressed reps of therex instructed last visit.  Pt able to complete exercises without c/o pain today.  Requires 2 pillows beneath hips to tolerate prone position.  Improved supine to sit transitioning today with less c/o pain.  Pt reported pain is the same at end session as when came into clinic, except less pain in upper back. PT Plan: Per PT; focus on strengthening of LE's and core stabiliility; try to stay away from adding modalities.  Continue to work on stability slowly work into standing activites     Problem List Patient Active Problem List   Diagnosis Date Noted  . Difficulty in walking(719.7) 06/26/2013  . Bilateral leg weakness 06/26/2013  . Occlusion and stenosis of carotid artery  without mention of cerebral infarction 04/04/2013  . Aftercare following surgery of the circulatory system, NEC 04/04/2013  . Anemia 10/19/2012  . Spondylolisthesis of lumbar region L3-4 L4-5 with stenosis 10/18/2012  . CKD (chronic kidney disease), stage II 10/18/2012  . C. difficile diarrhea 04/02/2012  . Acute renal failure 04/01/2012  . Encephalopathy 04/01/2012  . Sepsis 04/01/2012  . Vomiting 04/01/2012  . Hypokalemia 04/01/2012  . Chest pain 02/08/2012  . Renal insufficiency 02/08/2012  . Sheehan syndrome   . Hypertension   . Diabetes mellitus   . AAA (abdominal aortic aneurysm) 12/28/2011  . Abdominal aneurysm without mention of rupture 11/09/2011    PT - End of Session Activity Tolerance: Patient tolerated treatment well General Behavior During Therapy: [facility] for tasks assessed/performed   Lurena Nida, PTA/CLT 08/07/2013, 1:58 PM

## 2013-08-09 ENCOUNTER — Ambulatory Visit (HOSPITAL_COMMUNITY)
Admission: RE | Admit: 2013-08-09 | Discharge: 2013-08-09 | Disposition: A | Discharge status: Home / Self Care | Payer: Medicare Other | Source: Ambulatory Visit | Attending: Internal Medicine | Admitting: Internal Medicine

## 2013-08-09 NOTE — Progress Notes (Signed)
Physical Therapy Treatment Patient Details  Name: Katie Pace MRN: [id number] Date of Birth: December 14, 1935  Today's Date: 08/09/2013 Time: 8295-6213 PT Time Calculation (min): 53 min  Visit#: 13 of 24  Re-eval: 08/03/13 Authorization: medicare  Authorization Visit#: 13 of 20  Charges:  therex 48'  Subjective: Symptoms/Limitations Symptoms: Pt states she is hurting today as usual.  More pain in upper back and into Rt hip.  States 7/10 today. Pain Assessment Currently in Pain?: Yes Pain Score: 7  Pain Location: Back   Exercise/Treatments Aerobic Stationary Bike: Nu-step x 8' L2 hills#2 Standing Heel Raises: 10 reps;Limitations Heel Raises Limitations: toeraises 10 reps Functional Squats: 5 reps;Limitations Functional Squats Limitations: tactile cues Sidelying Clam: 15 reps Hip Abduction: 15 reps Prone  Straight Leg Raise: 15 reps Straight Leg Raises Limitations: bilaterally Other Prone Lumbar Exercises: knee flexion 2# 15 reps each bilateral     Physical Therapy Assessment and Plan PT Assessment and Plan Clinical Impression Statement: Pt reports completing decompression exercises at home.   Began gentle standing exercises today; tactile cues needed with squats for correct form.  Progressed reps of therex/added weight to prone hamstring curls without c/o pain.   continues to Require 2 pillows beneath hips to tolerate prone position. Overall all improved gait quality and posture. PT Plan: Per PT; focus on strengthening of LE's and core stabiliility; try to stay away from adding modalities.  Continue to work on stability slowly work into standing activites     Problem List Patient Active Problem List   Diagnosis Date Noted  . Difficulty in walking(719.7) 06/26/2013  . Bilateral leg weakness 06/26/2013  . Occlusion and stenosis of carotid artery without mention of cerebral infarction 04/04/2013  . Aftercare following surgery of the circulatory system, NEC 04/04/2013   . Anemia 10/19/2012  . Spondylolisthesis of lumbar region L3-4 L4-5 with stenosis 10/18/2012  . CKD (chronic kidney disease), stage II 10/18/2012  . C. difficile diarrhea 04/02/2012  . Acute renal failure 04/01/2012  . Encephalopathy 04/01/2012  . Sepsis 04/01/2012  . Vomiting 04/01/2012  . Hypokalemia 04/01/2012  . Chest pain 02/08/2012  . Renal insufficiency 02/08/2012  . Sheehan syndrome   . Hypertension   . Diabetes mellitus   . AAA (abdominal aortic aneurysm) 12/28/2011  . Abdominal aneurysm without mention of rupture 11/09/2011    PT - End of Session Activity Tolerance: Patient tolerated treatment well General Behavior During Therapy: [facility] for tasks assessed/performed   Lurena Nida, PTA/CLT 08/09/2013, 2:01 PM

## 2013-08-11 ENCOUNTER — Ambulatory Visit (HOSPITAL_COMMUNITY)
Admission: RE | Admit: 2013-08-11 | Discharge: 2013-08-11 | Disposition: A | Discharge status: Home / Self Care | Payer: Medicare Other | Source: Ambulatory Visit | Attending: Internal Medicine | Admitting: Internal Medicine

## 2013-08-11 DIAGNOSIS — R262 Difficulty in walking, not elsewhere classified: Secondary | ICD-10-CM

## 2013-08-11 DIAGNOSIS — R29898 Other symptoms and signs involving the musculoskeletal system: Secondary | ICD-10-CM

## 2013-08-11 NOTE — Progress Notes (Signed)
Physical Therapy Treatment Patient Details  Name: Katie Pace MRN: [id number] Date of Birth: Sep 28, 1936  Today's Date: 08/11/2013 Time: 4098-1191 PT Time Calculation (min): 50 min There ex 4782-9562 Visit#: 14 of 24  Re-eval: 08/03/13   Authorization: medicare  Authorization Visit#: 14 of 20   Subjective: Symptoms/Limitations Symptoms: Pt states her back continues to hurt.  Explained to pt that we are forcing her back mm to work and they are not use to it.Marland KitchenMarland KitchenPosture is improving.  Exercise/Treatments Aerobic Stationary Bike: Nu-step x 10' L3 hills#2   Standing Other Standing Lumbar Exercises: standing upright without UE assist x 30sec-1 min x 3 Seated Other Seated Lumbar Exercises: T-band ex red x 10 reps scapular retraciton, rows and shoulder extension    Sidelying Clam: 15 reps Hip Abduction: 15 reps Prone  Straight Leg Raise: 15 reps Straight Leg Raises Limitations: bilaterally Other Prone Lumbar Exercises: knee flexion 2# 15 reps each bilateral Other Prone Lumbar Exercises: heel squeeze; glut sets x 10    Physical Therapy Assessment and Plan PT Assessment and Plan Clinical Impression Statement: Attempted t-band exercises standing but pt was unable to tolerate.  Pt able to complete t-band ex for posture while sitting.  Pt demonstrating improve ease with bed mobility. PT Plan: Per PT; focus on strengthening of LE's and core stabiliility; try to stay away from adding modalities.  Continue to work on stability slowly work into standing activites      Problem List Patient Active Problem List   Diagnosis Date Noted  . Difficulty in walking(719.7) 06/26/2013  . Bilateral leg weakness 06/26/2013  . Occlusion and stenosis of carotid artery without mention of cerebral infarction 04/04/2013  . Aftercare following surgery of the circulatory system, NEC 04/04/2013  . Anemia 10/19/2012  . Spondylolisthesis of lumbar region L3-4 L4-5 with stenosis 10/18/2012  . CKD (chronic  kidney disease), stage II 10/18/2012  . C. difficile diarrhea 04/02/2012  . Acute renal failure 04/01/2012  . Encephalopathy 04/01/2012  . Sepsis 04/01/2012  . Vomiting 04/01/2012  . Hypokalemia 04/01/2012  . Chest pain 02/08/2012  . Renal insufficiency 02/08/2012  . Sheehan syndrome   . Hypertension   . Diabetes mellitus   . AAA (abdominal aortic aneurysm) 12/28/2011  . Abdominal aneurysm without mention of rupture 11/09/2011    PT - End of Session Activity Tolerance: Patient limited by pain General Behavior During Therapy: [facility] for tasks assessed/performed  GP    Katie Pace,CINDY 08/11/2013, 3:39 PM

## 2013-08-16 ENCOUNTER — Ambulatory Visit (HOSPITAL_COMMUNITY)
Admission: RE | Admit: 2013-08-16 | Discharge: 2013-08-16 | Disposition: A | Discharge status: Home / Self Care | Payer: Medicare Other | Source: Ambulatory Visit | Attending: Internal Medicine | Admitting: Internal Medicine

## 2013-08-16 NOTE — Progress Notes (Signed)
Physical Therapy Treatment Patient Details  Name: Katie Pace MRN: [id number] Date of Birth: February 01, 1936  Today's Date: 08/16/2013 Time: 1300-1350 PT Time Calculation (min): 50 min  Visit#: 15 of 24  Re-eval: 08/03/13 Authorization: medicare  Authorization Visit#: 15 of 20  Charges:  therex 56' with MHP, self care 14'  Subjective: Symptoms/Limitations Symptoms: Pt states she's been working on her exercises but not as much as she should.  States her pain is increased today to 8/10.  No known reason why pain increased. Pain Assessment Currently in Pain?: Yes Pain Score: 8  Pain Location: Back Pain Orientation: Mid;Lower Pain Radiating Towards: Right hip   Exercise/Treatments Aerobic Stationary Bike: Nu-step x 10' L3 hills#2 Supine Ab Set: 10 reps;5 seconds Clam: 5 reps Bridge: 10 reps Other Supine Lumbar Exercises:  hip adduction with ball 10X5"    Moist Heat Therapy Number Minutes Moist Heat: 25 Minutes Moist Heat Location: Other (comment)  Physical Therapy Assessment and Plan PT Assessment and Plan Clinical Impression Statement: Added MHP in supine today due to increase in pain.  Pt reported feeling dizzy like she was going to pass out.  BP/HR taken at 184/76 mmHg and 64 bpm.  Pt reported her BP normally runs high.  After 15 minutes, pt stated she felt much better and the feeling had passed.  Performed only gentle stab exercises in supine today.  Pt educated on importance of performing HEP daily even if with mild discomfort.  PT Plan: Per PT; focus on strengthening of LE's and core stabiliility; try to stay away from adding modalities.  Resume standing activities next visit.     Problem List Patient Active Problem List   Diagnosis Date Noted  . Difficulty in walking(719.7) 06/26/2013  . Bilateral leg weakness 06/26/2013  . Occlusion and stenosis of carotid artery without mention of cerebral infarction 04/04/2013  . Aftercare following surgery of the circulatory  system, NEC 04/04/2013  . Anemia 10/19/2012  . Spondylolisthesis of lumbar region L3-4 L4-5 with stenosis 10/18/2012  . CKD (chronic kidney disease), stage II 10/18/2012  . C. difficile diarrhea 04/02/2012  . Acute renal failure 04/01/2012  . Encephalopathy 04/01/2012  . Sepsis 04/01/2012  . Vomiting 04/01/2012  . Hypokalemia 04/01/2012  . Chest pain 02/08/2012  . Renal insufficiency 02/08/2012  . Sheehan syndrome   . Hypertension   . Diabetes mellitus   . AAA (abdominal aortic aneurysm) 12/28/2011  . Abdominal aneurysm without mention of rupture 11/09/2011    PT - End of Session Activity Tolerance: Patient limited by pain General Behavior During Therapy: [facility] for tasks assessed/performed   Lurena Nida, PTA/CLT 08/16/2013, 1:44 PM

## 2013-08-18 ENCOUNTER — Ambulatory Visit (HOSPITAL_COMMUNITY)
Admission: RE | Admit: 2013-08-18 | Discharge: 2013-08-18 | Disposition: A | Discharge status: Home / Self Care | Payer: Medicare Other | Source: Ambulatory Visit | Attending: Internal Medicine | Admitting: Internal Medicine

## 2013-08-18 DIAGNOSIS — R262 Difficulty in walking, not elsewhere classified: Secondary | ICD-10-CM

## 2013-08-18 DIAGNOSIS — R29898 Other symptoms and signs involving the musculoskeletal system: Secondary | ICD-10-CM

## 2013-08-18 NOTE — Progress Notes (Signed)
Physical Therapy Treatment Patient Details  Name: Katie Pace MRN: [id number] Date of Birth: Sep 05, 1936  Today's Date: 08/18/2013 Time: 8295-6213 PT Time Calculation (min): 48 min Charge there ex 0865-7846 Visit#: 16 of 24  Re-eval: 09/01/13    Authorization: medicare  Authorization Time Period:    Authorization Visit#: 16 of 20   Subjective: Symptoms/Limitations Symptoms: Pt states she is not feeling to bad today.  Pain Assessment Currently in Pain?: Yes Pain Score: 5  Pain Location: Back Pain Orientation: Right      Exercise/Treatments Stretches Active Hamstring Stretch: 2 reps;30 seconds Single Knee to Chest Stretch: 2 reps;30 seconds   Seated Other Seated Lumbar Exercises: T-band ex red x 10 reps scapular retraciton, rows and shoulder extension  Other Seated Lumbar Exercises: hip rolls x 10 Supine Ab Set: 10 reps;5 seconds Bent Knee Raise: 10 reps Bridge: 10 reps Other Supine Lumbar Exercises: hip adduction x 10 Sidelying Hip Abduction: 10 reps Prone  Straight Leg Raise: 10 reps Other Prone Lumbar Exercises: rows x 10 ; shld ext x 10    Modalities Modalities: Moist Heat  Physical Therapy Assessment and Plan PT Assessment and Plan Clinical Impression Statement: Pt progressed through exercises today. Pt understands that we are working towards better function which will only come with strengthening. PT Plan: Per PT; focus on strengthening of LE's and core stabiliility; try to stay away from adding modalities.  Resume standing activities next visit.    Goals  progressing  Problem List Patient Active Problem List   Diagnosis Date Noted  . Difficulty in walking(719.7) 06/26/2013  . Bilateral leg weakness 06/26/2013  . Occlusion and stenosis of carotid artery without mention of cerebral infarction 04/04/2013  . Aftercare following surgery of the circulatory system, NEC 04/04/2013  . Anemia 10/19/2012  . Spondylolisthesis of lumbar region L3-4 L4-5  with stenosis 10/18/2012  . CKD (chronic kidney disease), stage II 10/18/2012  . C. difficile diarrhea 04/02/2012  . Acute renal failure 04/01/2012  . Encephalopathy 04/01/2012  . Sepsis 04/01/2012  . Vomiting 04/01/2012  . Hypokalemia 04/01/2012  . Chest pain 02/08/2012  . Renal insufficiency 02/08/2012  . Sheehan syndrome   . Hypertension   . Diabetes mellitus   . AAA (abdominal aortic aneurysm) 12/28/2011  . Abdominal aneurysm without mention of rupture 11/09/2011    PT - End of Session Activity Tolerance: Patient limited by pain General Behavior During Therapy: [facility] for tasks assessed/performed  GP    RUSSELL,CINDY 08/18/2013, 4:01 PM

## 2013-08-21 ENCOUNTER — Ambulatory Visit (HOSPITAL_COMMUNITY)
Admission: RE | Admit: 2013-08-21 | Discharge: 2013-08-21 | Disposition: A | Discharge status: Home / Self Care | Payer: Medicare Other | Source: Ambulatory Visit | Attending: Internal Medicine | Admitting: Internal Medicine

## 2013-08-21 DIAGNOSIS — Z85828 Personal history of other malignant neoplasm of skin: Secondary | ICD-10-CM | POA: Diagnosis not present

## 2013-08-21 DIAGNOSIS — L570 Actinic keratosis: Secondary | ICD-10-CM | POA: Diagnosis not present

## 2013-08-21 DIAGNOSIS — L821 Other seborrheic keratosis: Secondary | ICD-10-CM | POA: Diagnosis not present

## 2013-08-21 NOTE — Progress Notes (Signed)
Physical Therapy Treatment Patient Details  Name: Katie Pace MRN: [id number] Date of Birth: 05/08/1936  Today's Date: 08/21/2013 Time: 4098-1191 PT Time Calculation (min): 42 min Charges: Therex x 39'  Visit#: 17 of 24  Re-eval: 09/01/13  Authorization: medicare  Authorization Visit#: 17 of 20   Subjective: Symptoms/Limitations Symptoms: Pt states her pain is low after taking a pain pill. Pain Assessment Currently in Pain?: Yes Pain Score: 3  Pain Location: Back Pain Orientation: Right  Exercise/Treatments Aerobic Stationary Bike: Nu-step x 10' L3 hills#3 to increase strength and activity tolerance Standing Heel Raises: 10 reps Other Standing Lumbar Exercises: Tall marching x 10 with BHHA; Hip abd/add x 10 bilateral Seated Other Seated Lumbar Exercises: Scap retraction x 10 with red tband Supine Ab Set: 10 reps;5 seconds Bridge: 10 reps  Physical Therapy Assessment and Plan PT Assessment and Plan Clinical Impression Statement: Pt displays improved posture and gait mechanics. Pt displays improved bed mobility as well. Progressed standing exercises with multiple seated rest breaks. Encouraged pt to become more aware of posture with gait. PT Plan: Per PT; focus on strengthening of LE's and core stability; try to stay away from adding modalities.    Problem List Patient Active Problem List   Diagnosis Date Noted  . Difficulty in walking(719.7) 06/26/2013  . Bilateral leg weakness 06/26/2013  . Occlusion and stenosis of carotid artery without mention of cerebral infarction 04/04/2013  . Aftercare following surgery of the circulatory system, NEC 04/04/2013  . Anemia 10/19/2012  . Spondylolisthesis of lumbar region L3-4 L4-5 with stenosis 10/18/2012  . CKD (chronic kidney disease), stage II 10/18/2012  . C. difficile diarrhea 04/02/2012  . Acute renal failure 04/01/2012  . Encephalopathy 04/01/2012  . Sepsis 04/01/2012  . Vomiting 04/01/2012  . Hypokalemia  04/01/2012  . Chest pain 02/08/2012  . Renal insufficiency 02/08/2012  . Sheehan syndrome   . Hypertension   . Diabetes mellitus   . AAA (abdominal aortic aneurysm) 12/28/2011  . Abdominal aneurysm without mention of rupture 11/09/2011    PT - End of Session Activity Tolerance: Patient limited by pain General Behavior During Therapy: [facility] for tasks assessed/performed  Seth Bake, PTA 08/21/2013, 1:56 PM

## 2013-08-22 DIAGNOSIS — E538 Deficiency of other specified B group vitamins: Secondary | ICD-10-CM | POA: Diagnosis not present

## 2013-08-22 DIAGNOSIS — E230 Hypopituitarism: Secondary | ICD-10-CM | POA: Diagnosis not present

## 2013-08-23 ENCOUNTER — Ambulatory Visit (HOSPITAL_COMMUNITY)
Admission: RE | Admit: 2013-08-23 | Discharge: 2013-08-23 | Disposition: A | Discharge status: Home / Self Care | Payer: Medicare Other | Source: Ambulatory Visit | Attending: Internal Medicine | Admitting: Internal Medicine

## 2013-08-23 NOTE — Progress Notes (Signed)
Physical Therapy Treatment Patient Details  Name: Katie Pace MRN: [id number] Date of Birth: 02-27-1936  Today's Date: 08/23/2013 Time: 8295-6213 PT Time Calculation (min): 42 min Charges: Therex x 39'  Visit#: 18 of 24  Re-eval: 09/01/13  Authorization: medicare  Authorization Visit#: 18 of 20   Subjective: Symptoms/Limitations Symptoms: Pt states that her pain is elevated today. Pain Assessment Currently in Pain?: Yes Pain Score: 5  Pain Location: Back   Exercise/Treatments Aerobic Stationary Bike: Nu-step x 10' L3 hills#3 to increase strength and activity tolerance Standing Heel Raises: 10 reps Other Standing Lumbar Exercises: Hip abd/add x 10 bilateral Seated Other Seated Lumbar Exercises: Sit to stand x 5 without UE assistance Supine Ab Set: 10 reps;5 seconds Bridge: 10 reps   Physical Therapy Assessment and Plan PT Assessment and Plan: Pt continues to progress well. Tolerance for standing activities appears to be improving. Pt lacks confidence when standing without UE assistance but responds well to motivational cueing. Pt's posture and gait mechanics continues to improve. PT Plan: Per PT; focus on strengthening of LE's and core stability; try to stay away from adding modalities.     Problem List Patient Active Problem List   Diagnosis Date Noted  . Difficulty in walking(719.7) 06/26/2013  . Bilateral leg weakness 06/26/2013  . Occlusion and stenosis of carotid artery without mention of cerebral infarction 04/04/2013  . Aftercare following surgery of the circulatory system, NEC 04/04/2013  . Anemia 10/19/2012  . Spondylolisthesis of lumbar region L3-4 L4-5 with stenosis 10/18/2012  . CKD (chronic kidney disease), stage II 10/18/2012  . C. difficile diarrhea 04/02/2012  . Acute renal failure 04/01/2012  . Encephalopathy 04/01/2012  . Sepsis 04/01/2012  . Vomiting 04/01/2012  . Hypokalemia 04/01/2012  . Chest pain 02/08/2012  . Renal insufficiency  02/08/2012  . Sheehan syndrome   . Hypertension   . Diabetes mellitus   . AAA (abdominal aortic aneurysm) 12/28/2011  . Abdominal aneurysm without mention of rupture 11/09/2011    PT - End of Session Activity Tolerance: Patient limited by pain General Behavior During Therapy: [facility] for tasks assessed/performed  Seth Bake, PTA  08/23/2013, 4:14 PM

## 2013-08-25 ENCOUNTER — Ambulatory Visit (HOSPITAL_COMMUNITY)
Admission: RE | Admit: 2013-08-25 | Discharge: 2013-08-25 | Disposition: A | Discharge status: Home / Self Care | Payer: Medicare Other | Source: Ambulatory Visit | Attending: Internal Medicine | Admitting: Internal Medicine

## 2013-08-25 DIAGNOSIS — R29898 Other symptoms and signs involving the musculoskeletal system: Secondary | ICD-10-CM

## 2013-08-25 DIAGNOSIS — G609 Hereditary and idiopathic neuropathy, unspecified: Secondary | ICD-10-CM | POA: Diagnosis not present

## 2013-08-25 DIAGNOSIS — E230 Hypopituitarism: Secondary | ICD-10-CM | POA: Diagnosis not present

## 2013-08-25 DIAGNOSIS — R262 Difficulty in walking, not elsewhere classified: Secondary | ICD-10-CM

## 2013-08-25 DIAGNOSIS — E538 Deficiency of other specified B group vitamins: Secondary | ICD-10-CM | POA: Diagnosis not present

## 2013-08-25 DIAGNOSIS — E559 Vitamin D deficiency, unspecified: Secondary | ICD-10-CM | POA: Diagnosis not present

## 2013-08-25 DIAGNOSIS — M8440XA Pathological fracture, unspecified site, initial encounter for fracture: Secondary | ICD-10-CM | POA: Diagnosis not present

## 2013-08-25 DIAGNOSIS — E119 Type 2 diabetes mellitus without complications: Secondary | ICD-10-CM | POA: Diagnosis not present

## 2013-08-25 NOTE — Progress Notes (Addendum)
Physical Therapy Treatment Patient Details  Name: Katie Pace MRN: [id number] Date of Birth: 1936/04/03  Today's Date: 08/25/2013 Time: 6962-9528 PT Time Calculation (min): 50 min Charge:  There ex 1303-1350 Visit#: 19 of 24  Re-eval: 09/01/13    Authorization Visit#: 19 of 20   Subjective: Symptoms/Limitations Symptoms: Pt states her back pain is up Pain Assessment Pain Score: 7  Pain Location: Back   Exercise/Treatments     Stretches Prone on Elbows Stretch: 30 seconds Aerobic Stationary Bike: Nu-step x 10' L3 hills#3 to increase strength and activity tolerance Standing Row: 10 reps (red) Shoulder Extension: 10 reps (red) Seated Other Seated Lumbar Exercises: Sit to stand x 5 without UE assistance   Sidelying Hip Abduction: 10 reps;Limitations Hip Abduction Limitations: both isometric and isotonic Prone  Single Arm Raise: 10 reps Straight Leg Raise: 10 reps Opposite Arm/Leg Raise: 10 reps Other Prone Lumbar Exercises: rows and shld ext x 10  with 2#; w-back x10 Knee flex B 3# x 10 Quadruped Single Arm Raise: 5 reps Other Quadruped Lumbar Exercises: shift weigjht forward and back     Physical Therapy Assessment and Plan PT Assessment and Plan Clinical Impression Statement: Added several new prone exercises to improve back and hip extension strength; posture worked on with facilitation of therapist PT Plan: Per PT; focus on strengthening of LE's and core stability; try to stay away from adding modalities.     Problem List Patient Active Problem List   Diagnosis Date Noted  . Difficulty in walking(719.7) 06/26/2013  . Bilateral leg weakness 06/26/2013  . Occlusion and stenosis of carotid artery without mention of cerebral infarction 04/04/2013  . Aftercare following surgery of the circulatory system, NEC 04/04/2013  . Anemia 10/19/2012  . Spondylolisthesis of lumbar region L3-4 L4-5 with stenosis 10/18/2012  . CKD (chronic kidney disease), stage II  10/18/2012  . C. difficile diarrhea 04/02/2012  . Acute renal failure 04/01/2012  . Encephalopathy 04/01/2012  . Sepsis 04/01/2012  . Vomiting 04/01/2012  . Hypokalemia 04/01/2012  . Chest pain 02/08/2012  . Renal insufficiency 02/08/2012  . Sheehan syndrome   . Hypertension   . Diabetes mellitus   . AAA (abdominal aortic aneurysm) 12/28/2011  . Abdominal aneurysm without mention of rupture 11/09/2011       GP    Katie Pace,Katie Pace 08/25/2013, 1:52 PM

## 2013-08-28 ENCOUNTER — Ambulatory Visit (HOSPITAL_COMMUNITY): Payer: Medicare Other | Admitting: Physical Therapy

## 2013-08-28 DIAGNOSIS — M1710 Unilateral primary osteoarthritis, unspecified knee: Secondary | ICD-10-CM | POA: Diagnosis not present

## 2013-08-28 DIAGNOSIS — M25469 Effusion, unspecified knee: Secondary | ICD-10-CM | POA: Diagnosis not present

## 2013-08-30 ENCOUNTER — Ambulatory Visit (HOSPITAL_COMMUNITY)
Admission: RE | Admit: 2013-08-30 | Discharge: 2013-08-30 | Disposition: A | Discharge status: Home / Self Care | Payer: Medicare Other | Source: Ambulatory Visit | Attending: Internal Medicine | Admitting: Internal Medicine

## 2013-08-30 DIAGNOSIS — R262 Difficulty in walking, not elsewhere classified: Secondary | ICD-10-CM

## 2013-08-30 DIAGNOSIS — R29898 Other symptoms and signs involving the musculoskeletal system: Secondary | ICD-10-CM

## 2013-08-30 NOTE — Progress Notes (Signed)
Physical Therapy Treatment Patient Details  Name: Katie Pace MRN: [id number] Date of Birth: [birth year]  Today's Date: 08/30/2013 Time: 9147-8295 Pt started by Becky Sax, PTA [phone]) PT Time Calculation (min): 45 min Charges: Therex x 32'  Visit#: 20 of 24  Re-eval: 09/01/13  Authorization: Medicare  Authorization Visit#: 20 of 20   Subjective: Symptoms/Limitations Symptoms: Pt stated she slipped out of bathtub and had increase fluids 110 cc removed Lt knee Pain Assessment Currently in Pain?: Yes Pain Score: 6  Pain Location: Back Pain Orientation: Right Multiple Pain Sites: Yes  Exercise/Treatments Aerobic Stationary Bike: Nu-step x 10' L3 hills#3 to increase strength and activity tolerance Standing Row: 10 reps;Theraband Theraband Level (Row): Level 2 (Red) Shoulder Extension: 10 reps;Theraband Theraband Level (Shoulder Extension): Level 2 (Red) Prone  Straight Leg Raise: 10 reps Other Prone Lumbar Exercises: shld ext x 10  with 2#; w-back x10 Other Prone Lumbar Exercises: knee flex  3# x 10   Physical Therapy Assessment and Plan PT Assessment and Plan Clinical Impression Statement: Pt completes therex well after initial cueing ad demo. Unable to complete quadruped this session secondary to knee pain. Pt is able to tolerate prone position well. Pt continues to display improved mobility. PT Plan: Reassess next session.    Problem List Patient Active Problem List   Diagnosis Date Noted  . Difficulty in walking(719.7) 06/26/2013  . Bilateral leg weakness 06/26/2013  . Occlusion and stenosis of carotid artery without mention of cerebral infarction 04/04/2013  . Aftercare following surgery of the circulatory system, NEC 04/04/2013  . Anemia 10/19/2012  . Spondylolisthesis of lumbar region L3-4 L4-5 with stenosis 10/18/2012  . CKD (chronic kidney disease), stage II 10/18/2012  . C. difficile diarrhea 04/02/2012  . Acute renal failure 04/01/2012  .  Encephalopathy 04/01/2012  . Sepsis 04/01/2012  . Vomiting 04/01/2012  . Hypokalemia 04/01/2012  . Chest pain 02/08/2012  . Renal insufficiency 02/08/2012  . Sheehan syndrome   . Hypertension   . Diabetes mellitus   . AAA (abdominal aortic aneurysm) 12/28/2011  . Abdominal aneurysm without mention of rupture 11/09/2011    PT - End of Session Activity Tolerance: Patient tolerated treatment well General Behavior During Therapy: [facility] for tasks assessed/performed  GP Functional Assessment Tool Used: Clinical judgement (FOTO to be completed next session) Functional Limitation: Mobility: Walking and moving around Mobility: Walking and Moving Around Current Status (H8469): At least 60 percent but less than 80 percent impaired, limited or restricted Mobility: Walking and Moving Around Goal Status [phone]): At least 40 percent but less than 60 percent impaired, limited or restricted  Seth Bake, PTA  08/30/2013, 5:08 PM

## 2013-09-06 ENCOUNTER — Ambulatory Visit (HOSPITAL_COMMUNITY)
Admission: RE | Admit: 2013-09-06 | Discharge: 2013-09-06 | Disposition: A | Discharge status: Home / Self Care | Payer: Medicare Other | Source: Ambulatory Visit | Attending: Neurological Surgery | Admitting: Neurological Surgery

## 2013-09-06 DIAGNOSIS — R29898 Other symptoms and signs involving the musculoskeletal system: Secondary | ICD-10-CM | POA: Diagnosis not present

## 2013-09-06 DIAGNOSIS — M545 Low back pain, unspecified: Secondary | ICD-10-CM | POA: Diagnosis not present

## 2013-09-06 DIAGNOSIS — IMO0001 Reserved for inherently not codable concepts without codable children: Secondary | ICD-10-CM | POA: Insufficient documentation

## 2013-09-06 DIAGNOSIS — I10 Essential (primary) hypertension: Secondary | ICD-10-CM | POA: Insufficient documentation

## 2013-09-06 DIAGNOSIS — E119 Type 2 diabetes mellitus without complications: Secondary | ICD-10-CM | POA: Insufficient documentation

## 2013-09-06 DIAGNOSIS — R262 Difficulty in walking, not elsewhere classified: Secondary | ICD-10-CM | POA: Insufficient documentation

## 2013-09-06 DIAGNOSIS — M79609 Pain in unspecified limb: Secondary | ICD-10-CM | POA: Insufficient documentation

## 2013-09-06 NOTE — Evaluation (Signed)
Physical Therapy Discharge Summary  Patient Details  Name: Katie Pace MRN: [id number] Date of Birth: [birth year]  Today's Date: 09/06/2013 Time: 4098-1191 PT Time Calculation (min): 40 min Charges: MMT x 4(7829-5621) Self care x 25'(1700-1725)              Visit#: 20 of 24  Re-eval: 09/01/13 Assessment Diagnosis: lumbar surgery Surgical Date: 10/18/12 Next MD Visit: Dr. Danielle Dess; January 2015  Authorization: Medicare    Authorization Visit#: 20 of 20   Past Medical History:  Past Medical History  Diagnosis Date  . Hypertension   . Hypothyroidism   . Stroke     Ischemic x 2       last one 2007  . Diabetes mellitus     type 11  . Endocrine disturbance     gland has not  worked until 1970.Marland Kitchen..dx in 1977  . Blood transfusion     back in 1973  . GERD (gastroesophageal reflux disease)     on meds to help....meds do well   . Sheehan syndrome 1977  . AAA (abdominal aortic aneurysm)     S/p aortic repair and endarterectomy 12/2011   . Carotid stenosis, left     S/p left CEA 09/2008  . PVD (peripheral vascular disease)   . C. difficile diarrhea   . Pneumonia     hx  . Heart murmur   . Arthritis   . Anemia     hx   Past Surgical History:  Past Surgical History  Procedure Laterality Date  . Joint replacement  May 2009    Right knee  . Joint replacement  April 2008    Right Hip prothesis  . Carotid endarterectomy  Dec. 2009  . Cardiac catheterization      1980  &  2009  . Appendectomy      1960  . Cesarean section    . Back surgery      1990's  . Abdominal hysterectomy  1973    1973  . Fracture surgery      left ankle   . Abdominal aortic aneurysm repair  12/09/2011    Procedure: ANEURYSM ABDOMINAL AORTIC REPAIR;  Surgeon: Pryor Ochoa, MD;  Location: [facility];  Service: Vascular;  Laterality: N/A;  resection and grafting abdominal aortic aneurysm; Insertion Bilateral External Iliac Graft; Reexploration Proximal Anastemosis; Aortic Endarterectomy; Reimplantation  Inferior Mesenteric Artery; Intraaortic Graft  . Abdominal aortic aneurysm repair    . Spine surgery  Jan. 14,2014    Lumbar Diskectomy    Subjective Symptoms/Limitations Symptoms: Pt states that she is hurting very bad today. Pain Assessment Currently in Pain?: Yes Pain Score: 10-Worst pain ever Pain Location: Back Pain Orientation: Right  Sensation/Coordination/Flexibility/Functional Tests Functional Tests Functional Tests: FOTO 31% (was 27% 08/02/13)  Assessment RLE Strength Right Hip Flexion: 4/5 (was 3+/5 (08/02/13)) Right Hip Extension: 3-/5 (was 3-/5 (08/02/13)) Right Hip ABduction: 4/5 (was 4/5 (08/02/13)) Right Knee Flexion: 4/5 (was 2-/5 (08/02/13)) Right Knee Extension: 5/5 (was 5/5 (08/02/13)) Right Ankle Dorsiflexion: 4/5 (was 4/5 (08/02/13)) LLE Strength Left Hip Flexion: 4/5 (was 4/5 (08/02/13)) Left Hip Extension: 3-/5 (was 3-/5 (08/02/13)) Left Hip ABduction: 4/5 (was 4/5 (08/02/13)) Left Knee Flexion: 3+/5 (was 1/5 (08/02/13)) Left Knee Extension: 5/5 (was 5/5 (08/02/13)) Left Ankle Dorsiflexion: 4/5 (was 4/5 (08/02/13))  Physical Therapy Assessment and Plan PT Assessment and Plan Clinical Impression Statement: Pt has made slight gains over last 4 weeks. Pt reports that she is completing HEP at least once a day.  Pt  was made aware that she could benefit from continued therapy but option for D/C to HEP was given as well. Pt requests to D/C to HEP. Encouraged pt to complete HEP at least twice a day and to work on increasing standing tolerance with husband present. Pt and husband are both agreeable to this. PT Plan: Recommend D/C to HEP per pt request.    Goals Home Exercise Program Pt/caregiver will Perform Home Exercise Program: For increased strengthening PT Short Term Goals Time to Complete Short Term Goals: 3 weeks PT Short Term Goal 1: Pt to be able to sit for 30 minutes without pain to enjoy a meal. PT Short Term Goal 1 - Progress: Met PT Short  Term Goal 2: Pt to be able to stand for five minutes to be able to complete ADL without pain PT Short Term Goal 2 - Progress: Progressing toward goal PT Short Term Goal 3: Pt to be able to walk with walker for 15 minutes PT Short Term Goal 3 - Progress: Met PT Short Term Goal 4: Pt pain to be no greater than a 5 80% of the day. PT Short Term Goal 4 - Progress: Progressing toward goal PT Long Term Goals Time to Complete Long Term Goals: 8 weeks PT Long Term Goal 1: I in advance HEP PT Long Term Goal 1 - Progress: Met PT Long Term Goal 2: Pt to be able to sit for an hour without pain to be able to go out to eat. PT Long Term Goal 2 - Progress: Met Long Term Goal 3: Pt to be able to stand for 15 minutes to make a small meal Long Term Goal 3 Progress: Progressing toward goal Long Term Goal 4: Pt to be able to walk with a walker for 40 minutes without increased pain Long Term Goal 4 Progress: Progressing toward goal PT Long Term Goal 5: Pt pain to be no greater than a 3/10 80% of the time.   Long Term Goal 5 Progress: Progressing toward goal Additional PT Long Term Goals?: Yes PT Long Term Goal 6: Pt to be able to go sit to stand on one attempt and without difficulty Long Term Goal 6 Progress: Met PT Long Term Goal 7: Pt to be able to control stand to sit to not reinjure back. Long Term Goal 7 Progress: Met PT Long Term Goal 8: Pt to be able to stand for two minutes without UE support to decrease risk for falling. Long Term Goal 8 Progress: Progressing toward goal PT Long Term Goal 9: improve strength by one grade to allow the above to occur. Long Term Goal 9 Progress: Partly met  Problem List Patient Active Problem List   Diagnosis Date Noted  . Difficulty in walking(719.7) 06/26/2013  . Bilateral leg weakness 06/26/2013  . Occlusion and stenosis of carotid artery without mention of cerebral infarction 04/04/2013  . Aftercare following surgery of the circulatory system, NEC 04/04/2013   . Anemia 10/19/2012  . Spondylolisthesis of lumbar region L3-4 L4-5 with stenosis 10/18/2012  . CKD (chronic kidney disease), stage II 10/18/2012  . C. difficile diarrhea 04/02/2012  . Acute renal failure 04/01/2012  . Encephalopathy 04/01/2012  . Sepsis 04/01/2012  . Vomiting 04/01/2012  . Hypokalemia 04/01/2012  . Chest pain 02/08/2012  . Renal insufficiency 02/08/2012  . Sheehan syndrome   . Hypertension   . Diabetes mellitus   . AAA (abdominal aortic aneurysm) 12/28/2011  . Abdominal aneurysm without mention of rupture 11/09/2011  PT - End of Session Activity Tolerance: Patient tolerated treatment well General Behavior During Therapy: WFL for tasks assessed/performed  GP Functional Assessment Tool Used: Clinical judgement (FOTO to be completed next session) Functional Limitation: Mobility: Walking and moving around Mobility: Walking and Moving Around Goal Status [phone]): At least 40 percent but less than 60 percent impaired, limited or restricted Mobility: Walking and Moving Around Discharge Status [phone]): At least 60 percent but less than 80 percent impaired, limited or restricted  Seth Bake, PTA 09/06/2013, 5:44 PM  Physician Documentation Your signature is required to indicate approval of the treatment plan as stated above.  Please sign and either send electronically or make a copy of this report for your files and return this physician signed original.   Please mark one 1.__approve of plan  2. ___approve of plan with the following conditions.   ______________________________                                                          _____________________ Physician Signature                                                                                                             Date

## 2013-09-08 ENCOUNTER — Ambulatory Visit (HOSPITAL_COMMUNITY): Payer: Medicare Other | Admitting: Physical Therapy

## 2013-09-11 ENCOUNTER — Ambulatory Visit (HOSPITAL_COMMUNITY): Payer: Medicare Other | Admitting: *Deleted

## 2013-09-13 ENCOUNTER — Ambulatory Visit (HOSPITAL_COMMUNITY): Payer: Medicare Other | Admitting: *Deleted

## 2013-09-15 ENCOUNTER — Ambulatory Visit (HOSPITAL_COMMUNITY): Payer: Medicare Other | Admitting: Physical Therapy

## 2013-09-18 ENCOUNTER — Ambulatory Visit (HOSPITAL_COMMUNITY): Payer: Medicare Other | Admitting: Physical Therapy

## 2013-09-20 ENCOUNTER — Ambulatory Visit (HOSPITAL_COMMUNITY): Payer: Medicare Other | Admitting: *Deleted

## 2013-09-25 ENCOUNTER — Ambulatory Visit (HOSPITAL_COMMUNITY): Payer: Medicare Other | Admitting: Physical Therapy

## 2013-09-29 ENCOUNTER — Ambulatory Visit (HOSPITAL_COMMUNITY): Payer: Medicare Other | Admitting: Physical Therapy

## 2013-10-09 DIAGNOSIS — IMO0002 Reserved for concepts with insufficient information to code with codable children: Secondary | ICD-10-CM | POA: Diagnosis not present

## 2013-10-09 DIAGNOSIS — M1710 Unilateral primary osteoarthritis, unspecified knee: Secondary | ICD-10-CM | POA: Diagnosis not present

## 2013-10-11 DIAGNOSIS — E669 Obesity, unspecified: Secondary | ICD-10-CM | POA: Diagnosis not present

## 2013-10-11 DIAGNOSIS — M48061 Spinal stenosis, lumbar region without neurogenic claudication: Secondary | ICD-10-CM | POA: Diagnosis not present

## 2013-10-11 DIAGNOSIS — I10 Essential (primary) hypertension: Secondary | ICD-10-CM | POA: Diagnosis not present

## 2013-11-20 ENCOUNTER — Other Ambulatory Visit: Payer: Self-pay | Admitting: Vascular Surgery

## 2013-11-20 DIAGNOSIS — I714 Abdominal aortic aneurysm, without rupture, unspecified: Secondary | ICD-10-CM

## 2013-11-20 DIAGNOSIS — Z48812 Encounter for surgical aftercare following surgery on the circulatory system: Secondary | ICD-10-CM

## 2013-11-20 DIAGNOSIS — I6529 Occlusion and stenosis of unspecified carotid artery: Secondary | ICD-10-CM

## 2013-12-11 DIAGNOSIS — E039 Hypothyroidism, unspecified: Secondary | ICD-10-CM | POA: Diagnosis not present

## 2013-12-11 DIAGNOSIS — E119 Type 2 diabetes mellitus without complications: Secondary | ICD-10-CM | POA: Diagnosis not present

## 2013-12-11 DIAGNOSIS — E785 Hyperlipidemia, unspecified: Secondary | ICD-10-CM | POA: Diagnosis not present

## 2013-12-11 DIAGNOSIS — I10 Essential (primary) hypertension: Secondary | ICD-10-CM | POA: Diagnosis not present

## 2013-12-13 DIAGNOSIS — E538 Deficiency of other specified B group vitamins: Secondary | ICD-10-CM | POA: Diagnosis not present

## 2013-12-13 DIAGNOSIS — E119 Type 2 diabetes mellitus without complications: Secondary | ICD-10-CM | POA: Diagnosis not present

## 2013-12-13 DIAGNOSIS — E039 Hypothyroidism, unspecified: Secondary | ICD-10-CM | POA: Diagnosis not present

## 2013-12-13 DIAGNOSIS — I10 Essential (primary) hypertension: Secondary | ICD-10-CM | POA: Diagnosis not present

## 2014-01-17 DIAGNOSIS — I10 Essential (primary) hypertension: Secondary | ICD-10-CM | POA: Diagnosis not present

## 2014-02-14 DIAGNOSIS — I10 Essential (primary) hypertension: Secondary | ICD-10-CM | POA: Diagnosis not present

## 2014-02-27 DIAGNOSIS — E119 Type 2 diabetes mellitus without complications: Secondary | ICD-10-CM | POA: Diagnosis not present

## 2014-02-27 DIAGNOSIS — E559 Vitamin D deficiency, unspecified: Secondary | ICD-10-CM | POA: Diagnosis not present

## 2014-02-27 DIAGNOSIS — G609 Hereditary and idiopathic neuropathy, unspecified: Secondary | ICD-10-CM | POA: Diagnosis not present

## 2014-02-27 DIAGNOSIS — M810 Age-related osteoporosis without current pathological fracture: Secondary | ICD-10-CM | POA: Diagnosis not present

## 2014-02-27 DIAGNOSIS — R609 Edema, unspecified: Secondary | ICD-10-CM | POA: Diagnosis not present

## 2014-02-27 DIAGNOSIS — E538 Deficiency of other specified B group vitamins: Secondary | ICD-10-CM | POA: Diagnosis not present

## 2014-02-27 DIAGNOSIS — E230 Hypopituitarism: Secondary | ICD-10-CM | POA: Diagnosis not present

## 2014-04-09 ENCOUNTER — Encounter: Payer: Self-pay | Admitting: Vascular Surgery

## 2014-04-10 ENCOUNTER — Ambulatory Visit (HOSPITAL_COMMUNITY)
Admission: RE | Admit: 2014-04-10 | Discharge: 2014-04-10 | Disposition: A | Discharge status: Home / Self Care | Payer: Medicare Other | Source: Ambulatory Visit | Attending: Vascular Surgery | Admitting: Vascular Surgery

## 2014-04-10 ENCOUNTER — Ambulatory Visit (INDEPENDENT_AMBULATORY_CARE_PROVIDER_SITE_OTHER): Payer: Medicare Other | Admitting: Vascular Surgery

## 2014-04-10 ENCOUNTER — Ambulatory Visit: Payer: Medicare Other | Admitting: Vascular Surgery

## 2014-04-10 ENCOUNTER — Encounter: Payer: Self-pay | Admitting: Vascular Surgery

## 2014-04-10 ENCOUNTER — Other Ambulatory Visit: Payer: Medicare Other

## 2014-04-10 ENCOUNTER — Ambulatory Visit (INDEPENDENT_AMBULATORY_CARE_PROVIDER_SITE_OTHER)
Admission: RE | Admit: 2014-04-10 | Discharge: 2014-04-10 | Disposition: A | Discharge status: Home / Self Care | Payer: Medicare Other | Source: Ambulatory Visit | Attending: Vascular Surgery | Admitting: Vascular Surgery

## 2014-04-10 VITALS — BP 157/91 | HR 56 | Ht 64.5 in | Wt 160.0 lb

## 2014-04-10 DIAGNOSIS — Z48812 Encounter for surgical aftercare following surgery on the circulatory system: Secondary | ICD-10-CM

## 2014-04-10 DIAGNOSIS — I714 Abdominal aortic aneurysm, without rupture, unspecified: Secondary | ICD-10-CM | POA: Insufficient documentation

## 2014-04-10 DIAGNOSIS — I6529 Occlusion and stenosis of unspecified carotid artery: Secondary | ICD-10-CM

## 2014-04-10 NOTE — Progress Notes (Signed)
Subjective:     Patient ID: Katie Pace, female   DOB: 1936-09-17, 78 y.o.   MRN: [id number]  HPI this 78 year old female returns for continued followup regarding her left carotid endarterectomy and resection and grafting of abdominal aortic aneurysm. She denies any neurologic symptoms such as lateralizing weakness, aphasia, amaurosis fugax, diplopia, blurred vision, or syncope. She has chronic back discomfort and has been followed in the past by Dr. Mallie Mussel elsewhere but no longer. She does have chronic leg discomfort. She denies any infection cellulitis or gangrene in the lower extremities. She does not ambulate well.  Past Medical History  Diagnosis Date  . Hypertension   . Hypothyroidism   . Stroke     Ischemic x 2       last one 2007  . Diabetes mellitus     type 11  . Endocrine disturbance     gland has not  worked until 1970.Marland Kitchen..dx in 1977  . Blood transfusion     back in 1973  . GERD (gastroesophageal reflux disease)     on meds to help....meds do well   . Sheehan syndrome 1977  . AAA (abdominal aortic aneurysm)     S/p aortic repair and endarterectomy 12/2011   . Carotid stenosis, left     S/p left CEA 09/2008  . PVD (peripheral vascular disease)   . C. difficile diarrhea   . Pneumonia     hx  . Heart murmur   . Arthritis   . Anemia     hx    History  Substance Use Topics  . Smoking status: Former Smoker -- 0.50 packs/day for 4 years    Types: Cigarettes    Quit date: 10/11/1961  . Smokeless tobacco: Never Used  . Alcohol Use: No    Family History  Problem Relation Age of Onset  . Heart disease Mother   . Peripheral vascular disease Mother   . Hypertension Mother   . Varicose Veins Mother   . Alcohol abuse Father   . Varicose Veins Father   . Heart disease Sister   . Heart disease Brother     Allergies  Allergen Reactions  . Codeine Nausea Only  . Dilaudid [Hydromorphone Hcl] Nausea Only  . Lincomycin Hcl Other (See Comments)    Big bumps bilateral  legs  . Macrodantin Hives  . Zolpidem Tartrate Other (See Comments)    Stroke  . Azithromycin Rash  . Penicillins Rash    Current outpatient prescriptions:alendronate (FOSAMAX) 70 MG tablet, Take 70 mg by mouth every 7 (seven) days. Takes on Mondays. Take with a full glass of water on an empty stomach., Disp: , Rfl: ;  amitriptyline (ELAVIL) 25 MG tablet, Take 25 mg by mouth at bedtime., Disp: , Rfl: ;  aspirin EC 81 MG tablet, Take 162 mg by mouth at bedtime., Disp: , Rfl: ;  atenolol (TENORMIN) 50 MG tablet, Take 100 mg by mouth daily. , Disp: , Rfl:  cholecalciferol (VITAMIN D) 1000 UNITS tablet, Take 3,000 Units by mouth daily., Disp: , Rfl: ;  diltiazem (CARDIZEM CD) 240 MG 24 hr capsule, Take 240 mg by mouth daily., Disp: , Rfl: ;  dipyridamole-aspirin (AGGRENOX) 25-200 MG per 12 hr capsule, Take 1 capsule by mouth 2 (two) times daily., Disp: , Rfl: ;  esomeprazole (NEXIUM) 40 MG capsule, Take 40 mg by mouth 2 (two) times daily. , Disp: , Rfl:  glucose monitoring kit (FREESTYLE) monitoring kit, 1 each by Does not apply route as  needed., Disp: , Rfl: ;  hydrocortisone (CORTEF) 20 MG tablet, Take 10-20 mg by mouth daily. Takes 64m in the morning and takes 126min the evening., Disp: , Rfl: ;  levothyroxine (SYNTHROID, LEVOTHROID) 75 MCG tablet, Take 75 mcg by mouth daily., Disp: , Rfl: ;  losartan (COZAAR) 100 MG tablet, Take 100 mg by mouth daily., Disp: , Rfl:  oxyCODONE-acetaminophen (PERCOCET) 7.5-325 MG per tablet, Take 1 tablet by mouth every 6 (six) hours as needed for pain., Disp: , Rfl: ;  potassium citrate (UROCIT-K) 10 MEQ (1080 MG) SR tablet, Take 20 mEq by mouth every morning. , Disp: , Rfl: ;  rosuvastatin (CRESTOR) 5 MG tablet, Take 5 mg by mouth at bedtime. , Disp: , Rfl:  oxyCODONE-acetaminophen (PERCOCET/ROXICET) 5-325 MG per tablet, Take 1-2 tablets by mouth every 4 (four) hours as needed for pain., Disp: 60 tablet, Rfl: 0;  pioglitazone (ACTOS) 45 MG tablet, Take 45 mg by mouth  every morning. , Disp: , Rfl: ;  Somatropin (HUMATROPE) 5 MG SOLR, Inject 0.3 mg into the skin every evening., Disp: , Rfl: ;  traMADol (ULTRAM) 50 MG tablet, Take 100 mg by mouth 3 (three) times daily as needed. For pain., Disp: , Rfl:   BP 157/91  Pulse 56  Ht 5' 4.5" (1.638 m)  Wt 160 lb (72.576 kg)  BMI 27.05 kg/m2  SpO2 98%  Body mass index is 27.05 kg/(m^2).           Review of Systems multiple complaints as noted in the present illness. Also has diffuse weakness in both lower extremities. All other systems negative and a complete review of systems.    Objective:   Physical Exam BP 157/91  Pulse 56  Ht 5' 4.5" (1.638 m)  Wt 160 lb (72.576 kg)  BMI 27.05 kg/m2  SpO2 98%  Gen.-alert and oriented x3 in no apparent distress-obese  HEENT normal for age Lungs no rhonchi or wheezing Cardiovascular regular rhythm no murmurs carotid pulses 3+ palpable no bruits audible Abdomen soft nontender no palpable masses-obese Musculoskeletal free of  major deformities Skin clear -no rashes Neurologic normal Lower extremities 3+ femoral and 1+ dorsalis pedis pulses bilaterally. Both feet well perfused.  Today I ordered lower stream the arterial Dopplers, carotid duplex exam, and ultrasound of her abdomen. Carotid Dopplers revealed no evidence of significant flow reduction at the left carotid endarterectomy site with mild right ICA stenosis in the 40-50% range. ABIs are noncompressible bilaterally with triphasic flow There is no abnormality noted around her aortic aneurysm repair.        Assessment:     Doing well post left carotid endarterectomy and open resection of abdominal aortic aneurysm    Plan:     Return in one year for carotid duplex exam and repeat ABIs and see nurse practitioner Continue aspirin daily as currently doing

## 2014-04-13 ENCOUNTER — Other Ambulatory Visit: Payer: Self-pay

## 2014-04-13 DIAGNOSIS — I6523 Occlusion and stenosis of bilateral carotid arteries: Secondary | ICD-10-CM

## 2014-04-13 DIAGNOSIS — Z48812 Encounter for surgical aftercare following surgery on the circulatory system: Secondary | ICD-10-CM

## 2014-04-13 DIAGNOSIS — I6529 Occlusion and stenosis of unspecified carotid artery: Secondary | ICD-10-CM

## 2014-04-13 DIAGNOSIS — I739 Peripheral vascular disease, unspecified: Secondary | ICD-10-CM

## 2014-06-07 DIAGNOSIS — I10 Essential (primary) hypertension: Secondary | ICD-10-CM | POA: Diagnosis not present

## 2014-06-07 DIAGNOSIS — E039 Hypothyroidism, unspecified: Secondary | ICD-10-CM | POA: Diagnosis not present

## 2014-06-07 DIAGNOSIS — E119 Type 2 diabetes mellitus without complications: Secondary | ICD-10-CM | POA: Diagnosis not present

## 2014-06-12 DIAGNOSIS — E230 Hypopituitarism: Secondary | ICD-10-CM | POA: Diagnosis not present

## 2014-06-12 DIAGNOSIS — E785 Hyperlipidemia, unspecified: Secondary | ICD-10-CM | POA: Diagnosis not present

## 2014-06-12 DIAGNOSIS — E782 Mixed hyperlipidemia: Secondary | ICD-10-CM | POA: Diagnosis not present

## 2014-06-12 DIAGNOSIS — E039 Hypothyroidism, unspecified: Secondary | ICD-10-CM | POA: Diagnosis not present

## 2014-06-13 ENCOUNTER — Encounter: Payer: Self-pay | Admitting: *Deleted

## 2014-06-22 DIAGNOSIS — Z23 Encounter for immunization: Secondary | ICD-10-CM | POA: Diagnosis not present

## 2014-07-03 DIAGNOSIS — E230 Hypopituitarism: Secondary | ICD-10-CM | POA: Diagnosis not present

## 2014-07-03 DIAGNOSIS — E538 Deficiency of other specified B group vitamins: Secondary | ICD-10-CM | POA: Diagnosis not present

## 2014-07-03 DIAGNOSIS — G609 Hereditary and idiopathic neuropathy, unspecified: Secondary | ICD-10-CM | POA: Diagnosis not present

## 2014-07-03 DIAGNOSIS — E559 Vitamin D deficiency, unspecified: Secondary | ICD-10-CM | POA: Diagnosis not present

## 2014-07-03 DIAGNOSIS — M810 Age-related osteoporosis without current pathological fracture: Secondary | ICD-10-CM | POA: Diagnosis not present

## 2014-07-03 DIAGNOSIS — E119 Type 2 diabetes mellitus without complications: Secondary | ICD-10-CM | POA: Diagnosis not present

## 2014-07-17 DIAGNOSIS — G3184 Mild cognitive impairment, so stated: Secondary | ICD-10-CM | POA: Diagnosis not present

## 2014-07-17 DIAGNOSIS — R634 Abnormal weight loss: Secondary | ICD-10-CM | POA: Diagnosis not present

## 2014-07-17 DIAGNOSIS — E230 Hypopituitarism: Secondary | ICD-10-CM | POA: Diagnosis not present

## 2014-07-17 DIAGNOSIS — E441 Mild protein-calorie malnutrition: Secondary | ICD-10-CM | POA: Diagnosis not present

## 2014-08-03 DIAGNOSIS — N289 Disorder of kidney and ureter, unspecified: Secondary | ICD-10-CM | POA: Diagnosis not present

## 2014-08-21 DIAGNOSIS — Z85828 Personal history of other malignant neoplasm of skin: Secondary | ICD-10-CM | POA: Diagnosis not present

## 2014-08-21 DIAGNOSIS — L570 Actinic keratosis: Secondary | ICD-10-CM | POA: Diagnosis not present

## 2014-09-03 DIAGNOSIS — H26493 Other secondary cataract, bilateral: Secondary | ICD-10-CM | POA: Diagnosis not present

## 2014-09-03 DIAGNOSIS — E119 Type 2 diabetes mellitus without complications: Secondary | ICD-10-CM | POA: Diagnosis not present

## 2014-09-03 DIAGNOSIS — H43813 Vitreous degeneration, bilateral: Secondary | ICD-10-CM | POA: Diagnosis not present

## 2014-09-03 DIAGNOSIS — Z961 Presence of intraocular lens: Secondary | ICD-10-CM | POA: Diagnosis not present

## 2014-10-09 DIAGNOSIS — M810 Age-related osteoporosis without current pathological fracture: Secondary | ICD-10-CM | POA: Diagnosis not present

## 2014-10-09 DIAGNOSIS — Z23 Encounter for immunization: Secondary | ICD-10-CM | POA: Diagnosis not present

## 2014-10-09 DIAGNOSIS — E1142 Type 2 diabetes mellitus with diabetic polyneuropathy: Secondary | ICD-10-CM | POA: Diagnosis not present

## 2014-10-09 DIAGNOSIS — E230 Hypopituitarism: Secondary | ICD-10-CM | POA: Diagnosis not present

## 2014-11-20 DIAGNOSIS — E782 Mixed hyperlipidemia: Secondary | ICD-10-CM | POA: Diagnosis not present

## 2014-11-20 DIAGNOSIS — E119 Type 2 diabetes mellitus without complications: Secondary | ICD-10-CM | POA: Diagnosis not present

## 2014-11-21 DIAGNOSIS — E441 Mild protein-calorie malnutrition: Secondary | ICD-10-CM | POA: Diagnosis not present

## 2014-11-21 DIAGNOSIS — R634 Abnormal weight loss: Secondary | ICD-10-CM | POA: Diagnosis not present

## 2014-11-21 DIAGNOSIS — E230 Hypopituitarism: Secondary | ICD-10-CM | POA: Diagnosis not present

## 2014-11-21 DIAGNOSIS — G3184 Mild cognitive impairment, so stated: Secondary | ICD-10-CM | POA: Diagnosis not present

## 2015-02-03 HISTORY — PX: EYE SURGERY: SHX253

## 2015-02-20 DIAGNOSIS — E782 Mixed hyperlipidemia: Secondary | ICD-10-CM | POA: Diagnosis not present

## 2015-02-20 DIAGNOSIS — E119 Type 2 diabetes mellitus without complications: Secondary | ICD-10-CM | POA: Diagnosis not present

## 2015-02-20 DIAGNOSIS — E039 Hypothyroidism, unspecified: Secondary | ICD-10-CM | POA: Diagnosis not present

## 2015-02-25 DIAGNOSIS — M810 Age-related osteoporosis without current pathological fracture: Secondary | ICD-10-CM | POA: Diagnosis not present

## 2015-02-25 DIAGNOSIS — N183 Chronic kidney disease, stage 3 (moderate): Secondary | ICD-10-CM | POA: Diagnosis not present

## 2015-02-25 DIAGNOSIS — E1122 Type 2 diabetes mellitus with diabetic chronic kidney disease: Secondary | ICD-10-CM | POA: Diagnosis not present

## 2015-02-25 DIAGNOSIS — E440 Moderate protein-calorie malnutrition: Secondary | ICD-10-CM | POA: Diagnosis not present

## 2015-03-11 DIAGNOSIS — S7001XA Contusion of right hip, initial encounter: Secondary | ICD-10-CM | POA: Diagnosis not present

## 2015-03-11 DIAGNOSIS — M25571 Pain in right ankle and joints of right foot: Secondary | ICD-10-CM | POA: Diagnosis not present

## 2015-03-11 DIAGNOSIS — M25561 Pain in right knee: Secondary | ICD-10-CM | POA: Diagnosis not present

## 2015-04-01 DIAGNOSIS — S7001XA Contusion of right hip, initial encounter: Secondary | ICD-10-CM | POA: Diagnosis not present

## 2015-04-01 DIAGNOSIS — S72113A Displaced fracture of greater trochanter of unspecified femur, initial encounter for closed fracture: Secondary | ICD-10-CM | POA: Diagnosis not present

## 2015-04-09 DIAGNOSIS — M810 Age-related osteoporosis without current pathological fracture: Secondary | ICD-10-CM | POA: Diagnosis not present

## 2015-04-09 DIAGNOSIS — E230 Hypopituitarism: Secondary | ICD-10-CM | POA: Diagnosis not present

## 2015-04-09 DIAGNOSIS — E1142 Type 2 diabetes mellitus with diabetic polyneuropathy: Secondary | ICD-10-CM | POA: Diagnosis not present

## 2015-04-16 ENCOUNTER — Ambulatory Visit: Payer: Medicare Other | Admitting: Family

## 2015-04-16 ENCOUNTER — Other Ambulatory Visit (HOSPITAL_COMMUNITY): Payer: Medicare Other

## 2015-04-16 ENCOUNTER — Encounter (HOSPITAL_COMMUNITY): Payer: Medicare Other

## 2015-04-22 ENCOUNTER — Encounter: Payer: Self-pay | Admitting: Family

## 2015-04-23 ENCOUNTER — Other Ambulatory Visit: Payer: Self-pay | Admitting: Vascular Surgery

## 2015-04-23 DIAGNOSIS — Z48812 Encounter for surgical aftercare following surgery on the circulatory system: Secondary | ICD-10-CM

## 2015-04-23 DIAGNOSIS — I6523 Occlusion and stenosis of bilateral carotid arteries: Secondary | ICD-10-CM

## 2015-04-23 DIAGNOSIS — I739 Peripheral vascular disease, unspecified: Secondary | ICD-10-CM

## 2015-04-24 ENCOUNTER — Ambulatory Visit (HOSPITAL_COMMUNITY)
Admission: RE | Admit: 2015-04-24 | Discharge: 2015-04-24 | Disposition: A | Discharge status: Home / Self Care | Payer: Medicare Other | Source: Ambulatory Visit | Attending: Family | Admitting: Family

## 2015-04-24 ENCOUNTER — Encounter: Payer: Self-pay | Admitting: Family

## 2015-04-24 ENCOUNTER — Ambulatory Visit (INDEPENDENT_AMBULATORY_CARE_PROVIDER_SITE_OTHER)
Admission: RE | Admit: 2015-04-24 | Discharge: 2015-04-24 | Disposition: A | Discharge status: Home / Self Care | Payer: Medicare Other | Source: Ambulatory Visit | Attending: Vascular Surgery | Admitting: Vascular Surgery

## 2015-04-24 ENCOUNTER — Ambulatory Visit (INDEPENDENT_AMBULATORY_CARE_PROVIDER_SITE_OTHER): Payer: Medicare Other | Admitting: Family

## 2015-04-24 VITALS — BP 122/55 | HR 43 | Temp 97.0°F | Resp 14 | Ht 63.0 in | Wt 139.0 lb

## 2015-04-24 DIAGNOSIS — Z9889 Other specified postprocedural states: Secondary | ICD-10-CM

## 2015-04-24 DIAGNOSIS — I739 Peripheral vascular disease, unspecified: Secondary | ICD-10-CM

## 2015-04-24 DIAGNOSIS — E230 Hypopituitarism: Secondary | ICD-10-CM | POA: Diagnosis not present

## 2015-04-24 DIAGNOSIS — I6523 Occlusion and stenosis of bilateral carotid arteries: Secondary | ICD-10-CM | POA: Diagnosis not present

## 2015-04-24 DIAGNOSIS — Z8679 Personal history of other diseases of the circulatory system: Secondary | ICD-10-CM

## 2015-04-24 DIAGNOSIS — Z48812 Encounter for surgical aftercare following surgery on the circulatory system: Secondary | ICD-10-CM | POA: Insufficient documentation

## 2015-04-24 DIAGNOSIS — Z87891 Personal history of nicotine dependence: Secondary | ICD-10-CM | POA: Diagnosis not present

## 2015-04-24 NOTE — Progress Notes (Signed)
[facility] [place] HISTORY AND PHYSICAL   MRN : [id number]  History of Present Illness:   Katie Pace is a 79 y.o. female patient of Dr. Kellie Simmering returns for continued followup s/p left carotid endarterectomy in 2009 and resection and grafting of abdominal aortic aneurysm in 2013.  States her ophthalmologist found evidence in her eye (unknown which eye) of a stroke, but she does not recall any stroke or TIA symptoms other than memory issues.   She has chronic back discomfort and has been followed in the past by Dr. Mallie Mussel elsewhere but no longer. She does have chronic leg discomfort. She denies any infection cellulitis or gangrene in the lower extremities. She does not ambulate well, she uses a walker at home, brought her own w/c today.  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 Buddy Duty follows her for this.  [place] and fractured right hip in May 2016, states it was not operable, states she was told to non weight bear to allow fracture to heal. Has a bruise above her right knee currently from this. Before she fell, she reports that due to back problems and back surgery, she did not walk much, used a walker. She denies non healing wounds.  She has chronic back pain from her lumbar spine issues, denies any new back pain, denies abdominal pain.  Pt Diabetic: Yes, states well controlled Pt smoker: former smoker, quit in the 1960's  Pt meds include: Statin :Yes ASA: Yes Other anticoagulants/antiplatelets: no  Current Outpatient Prescriptions  Medication Sig Dispense Refill  . alendronate (FOSAMAX) 70 MG tablet Take 70 mg by mouth every 7 (seven) days. Takes on Mondays. Take with a full glass of water on an empty stomach.    Marland Kitchen amitriptyline (ELAVIL) 25 MG tablet Take 25 mg by mouth at bedtime.    Marland Kitchen aspirin EC 81 MG tablet Take 162 mg by mouth at bedtime.    Marland Kitchen atenolol (TENORMIN) 50 MG tablet Take 100 mg by  mouth daily.     . cholecalciferol (VITAMIN D) 1000 UNITS tablet Take 3,000 Units by mouth daily.    Marland Kitchen diltiazem (CARDIZEM CD) 240 MG 24 hr capsule Take 240 mg by mouth daily.    Marland Kitchen dipyridamole-aspirin (AGGRENOX) 25-200 MG per 12 hr capsule Take 1 capsule by mouth 2 (two) times daily.    Marland Kitchen esomeprazole (NEXIUM) 40 MG capsule Take 40 mg by mouth 2 (two) times daily.     Marland Kitchen glucose monitoring kit (FREESTYLE) monitoring kit 1 each by Does not apply route as needed.    . hydrocortisone (CORTEF) 20 MG tablet Take 10-20 mg by mouth daily. Takes 26m in the morning and takes 11min the evening.    . Marland Kitchenevothyroxine (SYNTHROID, LEVOTHROID) 75 MCG tablet Take 75 mcg by mouth daily.    . Marland Kitchenosartan (COZAAR) 100 MG tablet Take 100 mg by mouth daily.    . Marland KitchenxyCODONE-acetaminophen (PERCOCET) 10-325 MG per tablet Take 1 tablet by mouth every 8 (eight) hours as needed for pain.    . Marland KitchenxyCODONE-acetaminophen (PERCOCET) 7.5-325 MG per tablet Take 1 tablet by mouth every 6 (six) hours as needed for pain.    . Marland KitchenxyCODONE-acetaminophen (PERCOCET/ROXICET) 5-325 MG per tablet Take 1-2 tablets by mouth every 4 (four) hours as needed for pain. (Patient not taking: Reported on 04/24/2015) 60 tablet 0  . pioglitazone (ACTOS) 45 MG tablet Take 45 mg by mouth every morning.     . potassium citrate (UROCIT-K)  10 MEQ (1080 MG) SR tablet Take 20 mEq by mouth every morning.     . rosuvastatin (CRESTOR) 5 MG tablet Take 5 mg by mouth at bedtime.     . Somatropin (HUMATROPE) 5 MG SOLR Inject 0.3 mg into the skin every evening.    . traMADol (ULTRAM) 50 MG tablet Take 100 mg by mouth 3 (three) times daily as needed. For pain.     No current facility-administered medications for this visit.    Past Medical History  Diagnosis Date  . Hypertension   . Hypothyroidism   . Stroke     Ischemic x 2       last one 2007  . Diabetes mellitus     type 11  . Endocrine disturbance     gland has not  worked until 1970.Marland Kitchen..dx in 1977  . Blood  transfusion     back in 1973  . GERD (gastroesophageal reflux disease)     on meds to help....meds do well   . Sheehan syndrome 1977  . AAA (abdominal aortic aneurysm)     S/p aortic repair and endarterectomy 12/2011   . Carotid stenosis, left     S/p left CEA 09/2008  . PVD (peripheral vascular disease)   . C. difficile diarrhea   . Pneumonia     hx  . Heart murmur   . Arthritis   . Anemia     hx    Social History History  Substance Use Topics  . Smoking status: Former Smoker -- 0.50 packs/day for 4 years    Types: Cigarettes    Quit date: 10/11/1961  . Smokeless tobacco: Never Used  . Alcohol Use: No    Family History Family History  Problem Relation Age of Onset  . Heart disease Mother   . Peripheral vascular disease Mother   . Hypertension Mother   . Varicose Veins Mother   . Alcohol abuse Father   . Varicose Veins Father   . Heart disease Sister   . Heart disease Brother     Surgical History Past Surgical History  Procedure Laterality Date  . Joint replacement  May 2009    Right knee  . Joint replacement  April 2008    Right Hip prothesis  . Carotid endarterectomy  Dec. 2009  . Cardiac catheterization      1980  &  2009  . Appendectomy      1960  . Cesarean section    . Back surgery      1990's  . Abdominal hysterectomy  1973    1973  . Fracture surgery      left ankle   . Abdominal aortic aneurysm repair  12/09/2011    Procedure: ANEURYSM ABDOMINAL AORTIC REPAIR;  Surgeon: Mal Misty, MD;  Location: [facility];  Service: Vascular;  Laterality: N/A;  resection and grafting abdominal aortic aneurysm; Insertion Bilateral External Iliac Graft; Reexploration Proximal Anastemosis; Aortic Endarterectomy; Reimplantation Inferior Mesenteric Artery; Intraaortic Graft  . Abdominal aortic aneurysm repair    . Spine surgery  Jan. 14,2014    Lumbar Diskectomy  . Eye surgery Right May 2016    Pt. fell    Allergies  Allergen Reactions  . Codeine Nausea Only   . Dilaudid [Hydromorphone Hcl] Nausea Only  . Lincomycin Hcl Other (See Comments)    Big bumps bilateral legs  . Macrodantin Hives  . Zolpidem Tartrate Other (See Comments)    Stroke  . Azithromycin Rash  . Penicillins Rash  Current Outpatient Prescriptions  Medication Sig Dispense Refill  . alendronate (FOSAMAX) 70 MG tablet Take 70 mg by mouth every 7 (seven) days. Takes on Mondays. Take with a full glass of water on an empty stomach.    Marland Kitchen amitriptyline (ELAVIL) 25 MG tablet Take 25 mg by mouth at bedtime.    Marland Kitchen aspirin EC 81 MG tablet Take 162 mg by mouth at bedtime.    Marland Kitchen atenolol (TENORMIN) 50 MG tablet Take 100 mg by mouth daily.     . cholecalciferol (VITAMIN D) 1000 UNITS tablet Take 3,000 Units by mouth daily.    Marland Kitchen diltiazem (CARDIZEM CD) 240 MG 24 hr capsule Take 240 mg by mouth daily.    Marland Kitchen dipyridamole-aspirin (AGGRENOX) 25-200 MG per 12 hr capsule Take 1 capsule by mouth 2 (two) times daily.    Marland Kitchen esomeprazole (NEXIUM) 40 MG capsule Take 40 mg by mouth 2 (two) times daily.     Marland Kitchen glucose monitoring kit (FREESTYLE) monitoring kit 1 each by Does not apply route as needed.    . hydrocortisone (CORTEF) 20 MG tablet Take 10-20 mg by mouth daily. Takes 36m in the morning and takes 152min the evening.    . Marland Kitchenevothyroxine (SYNTHROID, LEVOTHROID) 75 MCG tablet Take 75 mcg by mouth daily.    . Marland Kitchenosartan (COZAAR) 100 MG tablet Take 100 mg by mouth daily.    . Marland KitchenxyCODONE-acetaminophen (PERCOCET) 10-325 MG per tablet Take 1 tablet by mouth every 8 (eight) hours as needed for pain.    . Marland KitchenxyCODONE-acetaminophen (PERCOCET) 7.5-325 MG per tablet Take 1 tablet by mouth every 6 (six) hours as needed for pain.    . Marland KitchenxyCODONE-acetaminophen (PERCOCET/ROXICET) 5-325 MG per tablet Take 1-2 tablets by mouth every 4 (four) hours as needed for pain. (Patient not taking: Reported on 04/24/2015) 60 tablet 0  . pioglitazone (ACTOS) 45 MG tablet Take 45 mg by mouth every morning.     . potassium citrate  (UROCIT-K) 10 MEQ (1080 MG) SR tablet Take 20 mEq by mouth every morning.     . rosuvastatin (CRESTOR) 5 MG tablet Take 5 mg by mouth at bedtime.     . Somatropin (HUMATROPE) 5 MG SOLR Inject 0.3 mg into the skin every evening.    . traMADol (ULTRAM) 50 MG tablet Take 100 mg by mouth 3 (three) times daily as needed. For pain.     No current facility-administered medications for this visit.     REVIEW OF SYSTEMS: See HPI for pertinent positives and negatives.  Physical Examination Filed Vitals:   04/24/15 1324 04/24/15 1331  BP: 131/58 122/55  Pulse: 44 43  Temp:  97 F (36.1 C)  TempSrc:  Oral  Resp:  14  Height:  _0  (1.6 m)  Weight:  139 lb (63.05 kg)  SpO2:  96%   Body mass index is 24.63 kg/(m^2).  General:  WDWN in NAD Gait: seated in wheelchair HENT: WNL Eyes: Pupils equal Pulmonary: normal non-labored breathing , without Rales, rhonchi,  wheezing Cardiac: RRR, no murmur detected  Abdomen: soft, NT, no masses palpated Skin: no rashes, no ulcers;  no Gangrene , no cellulitis; no open wounds.   VASCULAR EXAM  Carotid Bruits Right Left   Negative Negative   Aorta is not palpable Radial pulses are 1+ palpable                            VASCULAR EXAM: Extremities without ischemic changes,  without Gangrene; without open wounds.                                                                                                          LE Pulses Right Left       FEMORAL  not palpable seated in wheelchair  not palpable seated in wheelchair        POPLITEAL  not palpable   not palpable       POSTERIOR TIBIAL  not palpable   not palpable        DORSALIS PEDIS      ANTERIOR TIBIAL not palpable  not palpable     Musculoskeletal: mild generalized muscle wasting and atrophy; no peripheral edema  Neurologic: A&O X 3; Appropriate Affect ;  MOTOR FUNCTION: 3/5 Symmetric, CN 2-12 intact except is somewhat hard of hearing Speech is fluent/normal   Non-Invasive  Vascular Imaging (04/24/2015):  CEREBROVASCULAR DUPLEX EVALUATION    INDICATION: Follow-up carotid disease     PREVIOUS INTERVENTION(S): Left carotid endarterectomy 09/12/2008    DUPLEX EXAM:     RIGHT  LEFT  Peak Systolic Velocities (cm/s) End Diastolic Velocities (cm/s) Plaque LOCATION Peak Systolic Velocities (cm/s) End Diastolic Velocities (cm/s) Plaque  80 0 HT CCA PROXIMAL 79 0   86 0 HT CCA MID 90 13   67 0 HT CCA DISTAL 59 0   83 0  ECA 67 0   147 17 HT/CP ICA PROXIMAL 31 6   175 17  ICA MID 57 12   88 15  ICA DISTAL 136 20     2.2 ICA / CCA Ratio (PSV) NA  Abnormal antegrade   Vertebral Flow Abnormal antegrade   720 Brachial Systolic Pressure (mmHg) 947  Within normal limits   Brachial Artery Waveforms Within normal limits     Plaque Morphology:  HM = Homogeneous, HT = Heterogeneous, CP = Calcific Plaque, SP = Smooth Plaque, IP = Irregular Plaque     ADDITIONAL FINDINGS: Right subclavian artery waveforms are biphasic with a delayed upstroke that may indicate proximal stenosis. Left proximal internal carotid artery appears aneurysmal and measures 1.5cm in diameter with soft plaque/ thrombus observed.  See impression on following page.      IMPRESSION: 1. Velocities suggest <40% stenosis of the right internal carotid artery; however, plaque appears worse. Plaque is visible in the distal internal carotid artery; unable to grade. 2. Patent left carotid endarterectomy with aneurysmal appearing proximal internal carotid artery. Distal internal carotid artery waveforms are turbulent. 3. Mild diffuse plaque is observed in the bilateral common carotid artery. 4. Bilateral vertebral artery is abnormal but antegrade (right worse than left.)    Compared to the previous exam:  Left internal carotid artery diameter was not previously mentioned. Bilateral vertebral artery was reported as antegrade without mention of abnormality.    ABI (Date: 04/24/2015)  R: [place] (04/10/14), DP:  triphasic, PT: triphasic, TBI: 0.78 (0.73)  L: [place] ([place]), DP: biphasic, PT: biphasic, TBI: 0.84 (0.66)     ASSESSMENT:  Palak T Fleer is a 79 y.o.  female who is s/p s/p left carotid endarterectomy in 2009 and resection and grafting of abdominal aortic aneurysm in 2013.  She has a not recent history of a possible TIA involving one eye, but she does not recall having symptoms. Today's carotid Duplex suggests <40% stenosis of the right internal carotid artery; however, plaque appears worse. Plaque is visible in the distal internal carotid artery; unable to grade. Left proximal internal carotid artery appears aneurysmal and measures 1.5cm in diameter with soft plaque/ thrombus observed.  ABI's: Again unable to obtain due to non compressible vessels indicating the presence of tibial artery medial calcification. Tri and biphasic waveforms. TBI is stable on the right, improved on the left; both are normal.   Fortunately she has not used tobacco since the 1960's. Her calcified arteries may be secondary to her non functioning pituitary gland which leads to a host of endocrine disorders.  Dr. Scot Dock reviewed the Duplex images of pt carotid arteries, focusing on the left proximal ICA dilation; appears minimally dilated with laminar plaque.  Face to face time with patient was 25 minutes. Over 50% of this time was spent on counseling and coordination of care.   PLAN:   Seated leg exercises 3x/day as discussed and demonstrated.   Based on today's exam and non-invasive vascular lab results, and after discussing with Dr. Scot Dock, the patient will follow up in 6 months with the following tests: carotid Duplex and ABI's, see Dr. Kellie Simmering. I discussed in depth with the patient the nature of atherosclerosis, and emphasized the importance of maximal medical management including strict control of blood pressure, blood glucose, and lipid levels, obtaining regular exercise, and cessation of smoking.  The patient  is aware that without maximal medical management the underlying atherosclerotic disease process will progress, limiting the benefit of any interventions.  The patient was given information about stroke prevention and what symptoms should prompt the patient to seek immediate medical care.  The patient was given information about PAD including signs, symptoms, treatment, what symptoms should prompt the patient to seek immediate medical care, and risk reduction measures to take. Thank you for allowing [place] to participate in this patient's care.  Clemon Chambers, RN, MSN, FNP-C [facility] Office: [phone]  Clinic MD: Scot Dock  04/24/2015 1:52 PM

## 2015-04-24 NOTE — Patient Instructions (Addendum)

## 2015-04-29 DIAGNOSIS — S7001XD Contusion of right hip, subsequent encounter: Secondary | ICD-10-CM | POA: Diagnosis not present

## 2015-04-29 DIAGNOSIS — M25561 Pain in right knee: Secondary | ICD-10-CM | POA: Diagnosis not present

## 2015-05-27 DIAGNOSIS — M25561 Pain in right knee: Secondary | ICD-10-CM | POA: Diagnosis not present

## 2015-05-27 DIAGNOSIS — S7001XD Contusion of right hip, subsequent encounter: Secondary | ICD-10-CM | POA: Diagnosis not present

## 2015-06-17 DIAGNOSIS — N183 Chronic kidney disease, stage 3 (moderate): Secondary | ICD-10-CM | POA: Diagnosis not present

## 2015-06-17 DIAGNOSIS — D649 Anemia, unspecified: Secondary | ICD-10-CM | POA: Diagnosis not present

## 2015-06-17 DIAGNOSIS — E1122 Type 2 diabetes mellitus with diabetic chronic kidney disease: Secondary | ICD-10-CM | POA: Diagnosis not present

## 2015-06-17 DIAGNOSIS — E039 Hypothyroidism, unspecified: Secondary | ICD-10-CM | POA: Diagnosis not present

## 2015-06-19 DIAGNOSIS — E1129 Type 2 diabetes mellitus with other diabetic kidney complication: Secondary | ICD-10-CM | POA: Diagnosis not present

## 2015-06-19 DIAGNOSIS — R944 Abnormal results of kidney function studies: Secondary | ICD-10-CM | POA: Diagnosis not present

## 2015-06-19 DIAGNOSIS — Z23 Encounter for immunization: Secondary | ICD-10-CM | POA: Diagnosis not present

## 2015-06-19 DIAGNOSIS — I714 Abdominal aortic aneurysm, without rupture: Secondary | ICD-10-CM | POA: Diagnosis not present

## 2015-06-19 DIAGNOSIS — E039 Hypothyroidism, unspecified: Secondary | ICD-10-CM | POA: Diagnosis not present

## 2015-08-20 DIAGNOSIS — L570 Actinic keratosis: Secondary | ICD-10-CM | POA: Diagnosis not present

## 2015-08-20 DIAGNOSIS — Z85828 Personal history of other malignant neoplasm of skin: Secondary | ICD-10-CM | POA: Diagnosis not present

## 2015-09-05 DIAGNOSIS — H52203 Unspecified astigmatism, bilateral: Secondary | ICD-10-CM | POA: Diagnosis not present

## 2015-09-05 DIAGNOSIS — H26493 Other secondary cataract, bilateral: Secondary | ICD-10-CM | POA: Diagnosis not present

## 2015-09-05 DIAGNOSIS — H43813 Vitreous degeneration, bilateral: Secondary | ICD-10-CM | POA: Diagnosis not present

## 2015-09-05 DIAGNOSIS — E119 Type 2 diabetes mellitus without complications: Secondary | ICD-10-CM | POA: Diagnosis not present

## 2015-09-20 DIAGNOSIS — E1129 Type 2 diabetes mellitus with other diabetic kidney complication: Secondary | ICD-10-CM | POA: Diagnosis not present

## 2015-09-20 DIAGNOSIS — E039 Hypothyroidism, unspecified: Secondary | ICD-10-CM | POA: Diagnosis not present

## 2015-09-23 DIAGNOSIS — E1122 Type 2 diabetes mellitus with diabetic chronic kidney disease: Secondary | ICD-10-CM | POA: Diagnosis not present

## 2015-09-23 DIAGNOSIS — I714 Abdominal aortic aneurysm, without rupture: Secondary | ICD-10-CM | POA: Diagnosis not present

## 2015-09-23 DIAGNOSIS — R634 Abnormal weight loss: Secondary | ICD-10-CM | POA: Diagnosis not present

## 2015-09-23 DIAGNOSIS — R944 Abnormal results of kidney function studies: Secondary | ICD-10-CM | POA: Diagnosis not present

## 2015-09-23 DIAGNOSIS — E230 Hypopituitarism: Secondary | ICD-10-CM | POA: Diagnosis not present

## 2015-09-23 DIAGNOSIS — E039 Hypothyroidism, unspecified: Secondary | ICD-10-CM | POA: Diagnosis not present

## 2015-09-23 DIAGNOSIS — I6529 Occlusion and stenosis of unspecified carotid artery: Secondary | ICD-10-CM | POA: Diagnosis not present

## 2015-09-23 DIAGNOSIS — F0390 Unspecified dementia without behavioral disturbance: Secondary | ICD-10-CM | POA: Diagnosis not present

## 2015-09-23 DIAGNOSIS — M816 Localized osteoporosis [Lequesne]: Secondary | ICD-10-CM | POA: Diagnosis not present

## 2015-10-17 DIAGNOSIS — H26492 Other secondary cataract, left eye: Secondary | ICD-10-CM | POA: Diagnosis not present

## 2015-10-17 DIAGNOSIS — H2640 Unspecified secondary cataract: Secondary | ICD-10-CM | POA: Diagnosis not present

## 2015-10-22 ENCOUNTER — Encounter: Payer: Self-pay | Admitting: Vascular Surgery

## 2015-10-24 DIAGNOSIS — H2640 Unspecified secondary cataract: Secondary | ICD-10-CM | POA: Diagnosis not present

## 2015-10-24 DIAGNOSIS — H26491 Other secondary cataract, right eye: Secondary | ICD-10-CM | POA: Diagnosis not present

## 2015-10-28 DIAGNOSIS — I6529 Occlusion and stenosis of unspecified carotid artery: Secondary | ICD-10-CM | POA: Diagnosis not present

## 2015-10-28 DIAGNOSIS — F0390 Unspecified dementia without behavioral disturbance: Secondary | ICD-10-CM | POA: Diagnosis not present

## 2015-10-29 ENCOUNTER — Ambulatory Visit (INDEPENDENT_AMBULATORY_CARE_PROVIDER_SITE_OTHER): Payer: Medicare Other | Admitting: Vascular Surgery

## 2015-10-29 ENCOUNTER — Ambulatory Visit (INDEPENDENT_AMBULATORY_CARE_PROVIDER_SITE_OTHER)
Admission: RE | Admit: 2015-10-29 | Discharge: 2015-10-29 | Disposition: A | Discharge status: Home / Self Care | Payer: Medicare Other | Source: Ambulatory Visit | Attending: Vascular Surgery | Admitting: Vascular Surgery

## 2015-10-29 ENCOUNTER — Ambulatory Visit (HOSPITAL_COMMUNITY)
Admission: RE | Admit: 2015-10-29 | Discharge: 2015-10-29 | Disposition: A | Discharge status: Home / Self Care | Payer: Medicare Other | Source: Ambulatory Visit | Attending: Vascular Surgery | Admitting: Vascular Surgery

## 2015-10-29 VITALS — BP 196/78 | HR 71 | Temp 97.3°F | Resp 14 | Ht 63.0 in | Wt 139.0 lb

## 2015-10-29 DIAGNOSIS — Z8679 Personal history of other diseases of the circulatory system: Secondary | ICD-10-CM

## 2015-10-29 DIAGNOSIS — E119 Type 2 diabetes mellitus without complications: Secondary | ICD-10-CM | POA: Insufficient documentation

## 2015-10-29 DIAGNOSIS — I779 Disorder of arteries and arterioles, unspecified: Secondary | ICD-10-CM | POA: Diagnosis not present

## 2015-10-29 DIAGNOSIS — I6521 Occlusion and stenosis of right carotid artery: Secondary | ICD-10-CM | POA: Diagnosis not present

## 2015-10-29 DIAGNOSIS — I10 Essential (primary) hypertension: Secondary | ICD-10-CM | POA: Diagnosis not present

## 2015-10-29 DIAGNOSIS — Z9889 Other specified postprocedural states: Secondary | ICD-10-CM | POA: Insufficient documentation

## 2015-10-29 DIAGNOSIS — I6529 Occlusion and stenosis of unspecified carotid artery: Secondary | ICD-10-CM | POA: Insufficient documentation

## 2015-10-29 NOTE — Progress Notes (Signed)
Subjective:     Patient ID: Katie Pace, female   DOB: 07/10/1936, 80 y.o.   MRN: [id number]  HPI this 80 year old female returns for continued follow-up regarding her carotid occlusive disease. She underwent left carotid endarterectomy by me in December 2009 and she has done well from that standpoint. She has had evidence of some mild to moderate right ICA stenosis. She denies lateralizing weakness, aphasia, amaurosis fugax, diplopia, blurred vision, or syncope. She also has had resection and grafting of abdominal aortic aneurysm in the past by me and is doing well from that standpoint. She takes Aggrenox daily. She has no specific complaints other than some occasional instability.  Past Medical History  Diagnosis Date  . Hypertension   . Hypothyroidism   . Stroke     Ischemic x 2       last one 2007  . Diabetes mellitus     type 11  . Endocrine disturbance     gland has not  worked until 1970.Marland Kitchen..dx in 1977  . Blood transfusion     back in 1973  . GERD (gastroesophageal reflux disease)     on meds to help....meds do well   . Sheehan syndrome 1977  . AAA (abdominal aortic aneurysm)     S/p aortic repair and endarterectomy 12/2011   . Carotid stenosis, left     S/p left CEA 09/2008  . PVD (peripheral vascular disease)   . C. difficile diarrhea   . Pneumonia     hx  . Heart murmur   . Arthritis   . Anemia     hx    Social History  Substance Use Topics  . Smoking status: Former Smoker -- 0.50 packs/day for 4 years    Types: Cigarettes    Quit date: 10/11/1961  . Smokeless tobacco: Never Used  . Alcohol Use: No    Family History  Problem Relation Age of Onset  . Heart disease Mother   . Peripheral vascular disease Mother   . Hypertension Mother   . Varicose Veins Mother   . Alcohol abuse Father   . Varicose Veins Father   . Heart disease Sister   . Heart disease Brother     Allergies  Allergen Reactions  . Codeine Nausea Only  . Dilaudid [Hydromorphone Hcl]  Nausea Only  . Lincomycin Hcl Other (See Comments)    Big bumps bilateral legs  . Macrodantin Hives  . Zolpidem Tartrate Other (See Comments)    Stroke  . Azithromycin Rash  . Penicillins Rash     Current outpatient prescriptions:  .  alendronate (FOSAMAX) 70 MG tablet, Take 70 mg by mouth every 7 (seven) days. Takes on Mondays. Take with a full glass of water on an empty stomach., Disp: , Rfl:  .  amitriptyline (ELAVIL) 25 MG tablet, Take 25 mg by mouth at bedtime., Disp: , Rfl:  .  atenolol (TENORMIN) 50 MG tablet, Take 100 mg by mouth daily. , Disp: , Rfl:  .  diltiazem (CARDIZEM CD) 240 MG 24 hr capsule, Take 240 mg by mouth daily., Disp: , Rfl:  .  dipyridamole-aspirin (AGGRENOX) 25-200 MG per 12 hr capsule, Take 1 capsule by mouth 2 (two) times daily., Disp: , Rfl:  .  esomeprazole (NEXIUM) 40 MG capsule, Take 40 mg by mouth 2 (two) times daily. , Disp: , Rfl:  .  hydrocortisone (CORTEF) 20 MG tablet, Take 10-20 mg by mouth daily. Takes 57m in the morning and takes 198min the  evening., Disp: , Rfl:  .  levothyroxine (SYNTHROID, LEVOTHROID) 75 MCG tablet, Take 75 mcg by mouth daily., Disp: , Rfl:  .  losartan (COZAAR) 100 MG tablet, Take 100 mg by mouth daily., Disp: , Rfl:  .  memantine (NAMENDA) 10 MG tablet, Take 10 mg by mouth 2 (two) times daily., Disp: , Rfl:  .  oxyCODONE-acetaminophen (PERCOCET) 10-325 MG per tablet, Take 1 tablet by mouth every 8 (eight) hours as needed for pain. Reported on 10/29/2015, Disp: , Rfl:  .  potassium citrate (UROCIT-K) 10 MEQ (1080 MG) SR tablet, Take 20 mEq by mouth every morning. , Disp: , Rfl:  .  rosuvastatin (CRESTOR) 5 MG tablet, Take 5 mg by mouth at bedtime. , Disp: , Rfl:  .  aspirin EC 81 MG tablet, Take 162 mg by mouth at bedtime. Reported on 10/29/2015, Disp: , Rfl:  .  cholecalciferol (VITAMIN D) 1000 UNITS tablet, Take 3,000 Units by mouth daily. Reported on 10/29/2015, Disp: , Rfl:  .  glucose monitoring kit (FREESTYLE) monitoring  kit, 1 each by Does not apply route as needed. Reported on 10/29/2015, Disp: , Rfl:  .  oxyCODONE-acetaminophen (PERCOCET) 7.5-325 MG per tablet, Take 1 tablet by mouth every 6 (six) hours as needed for pain. Reported on 10/29/2015, Disp: , Rfl:  .  oxyCODONE-acetaminophen (PERCOCET/ROXICET) 5-325 MG per tablet, Take 1-2 tablets by mouth every 4 (four) hours as needed for pain. (Patient not taking: Reported on 04/24/2015), Disp: 60 tablet, Rfl: 0 .  pioglitazone (ACTOS) 45 MG tablet, Take 45 mg by mouth every morning. Reported on 10/29/2015, Disp: , Rfl:  .  Somatropin (HUMATROPE) 5 MG SOLR, Inject 0.3 mg into the skin every evening. Reported on 10/29/2015, Disp: , Rfl:  .  traMADol (ULTRAM) 50 MG tablet, Take 100 mg by mouth 3 (three) times daily as needed. Reported on 10/29/2015, Disp: , Rfl:   Filed Vitals:   10/29/15 1516 10/29/15 1517 10/29/15 1518  BP: 219/87 211/83 196/78  Pulse: 71 71 71  Temp: 97.3 F (36.3 C)    Resp: 14    Height: 5' 3"  (1.6 m)    Weight: 139 lb (63.05 kg)    SpO2: 96%      Body mass index is 24.63 kg/(m^2).           Review of Systems denies active chest pain, hemoptysis, claudication. Has history of small bowel obstruction 2 years ago. Does have instability with ambulation as noted.    Objective:   Physical Exam BP 196/78 mmHg  Pulse 71  Temp(Src) 97.3 F (36.3 C)  Resp 14  Ht 5' 3"  (1.6 m)  Wt 139 lb (63.05 kg)  BMI 24.63 kg/m2  SpO2 96%  Gen.-alert and oriented x3 in no apparent distress HEENT normal for age Lungs no rhonchi or wheezing Cardiovascular regular rhythm no murmurs carotid pulses 3+ palpable no bruits audible Abdomen soft nontender no palpable masses Musculoskeletal free of  major deformities Skin clear -no rashes Neurologic normal Lower extremities 3+ femoral and popliteal pulses palpable bilaterally. Both feet well perfused.  Today I ordered a carotid duplex exam which I reviewed and interpreted. The left carotid  endarterectomy site is widely patent. The right internal carotid has an approximate 40% stenosis which is unchanged.       Assessment:     Status post left carotid endarterectomy in December 2009-doing well Mild to moderate right ICA stenosis-asymptomatic Status post resection grafting abdominal aortic aneurysm many years ago  Plan:     Return in 1 year with carotid duplex exam unless patient develops any specific neurologic symptoms in the interim. If so they will be in touch with [place]. Continue daily Aggrenox

## 2015-10-29 NOTE — Progress Notes (Signed)
Filed Vitals:   10/29/15 1516 10/29/15 1517 10/29/15 1518  BP: 219/87 211/83 196/78  Pulse: 71 71 71  Temp: 97.3 F (36.3 C)    Resp: 14    Height: 5\' 3"  (1.6 m)    Weight: 139 lb (63.05 kg)    SpO2: 96%

## 2015-12-30 DIAGNOSIS — E782 Mixed hyperlipidemia: Secondary | ICD-10-CM | POA: Diagnosis not present

## 2015-12-30 DIAGNOSIS — E039 Hypothyroidism, unspecified: Secondary | ICD-10-CM | POA: Diagnosis not present

## 2015-12-30 DIAGNOSIS — E1122 Type 2 diabetes mellitus with diabetic chronic kidney disease: Secondary | ICD-10-CM | POA: Diagnosis not present

## 2016-01-01 DIAGNOSIS — I10 Essential (primary) hypertension: Secondary | ICD-10-CM | POA: Diagnosis not present

## 2016-01-01 DIAGNOSIS — N183 Chronic kidney disease, stage 3 (moderate): Secondary | ICD-10-CM | POA: Diagnosis not present

## 2016-01-01 DIAGNOSIS — E230 Hypopituitarism: Secondary | ICD-10-CM | POA: Diagnosis not present

## 2016-01-01 DIAGNOSIS — E1122 Type 2 diabetes mellitus with diabetic chronic kidney disease: Secondary | ICD-10-CM | POA: Diagnosis not present

## 2016-01-28 DIAGNOSIS — E1122 Type 2 diabetes mellitus with diabetic chronic kidney disease: Secondary | ICD-10-CM | POA: Diagnosis not present

## 2016-01-28 DIAGNOSIS — I10 Essential (primary) hypertension: Secondary | ICD-10-CM | POA: Diagnosis not present

## 2016-01-28 DIAGNOSIS — E039 Hypothyroidism, unspecified: Secondary | ICD-10-CM | POA: Diagnosis not present

## 2016-01-28 DIAGNOSIS — N183 Chronic kidney disease, stage 3 (moderate): Secondary | ICD-10-CM | POA: Diagnosis not present

## 2016-02-13 ENCOUNTER — Encounter: Payer: Self-pay | Admitting: Vascular Surgery

## 2016-02-18 ENCOUNTER — Encounter: Payer: Self-pay | Admitting: Vascular Surgery

## 2016-02-18 ENCOUNTER — Ambulatory Visit (INDEPENDENT_AMBULATORY_CARE_PROVIDER_SITE_OTHER): Payer: Medicare Other | Admitting: Vascular Surgery

## 2016-02-18 VITALS — BP 115/71 | HR 59 | Temp 97.0°F | Resp 16 | Ht 64.0 in | Wt 139.0 lb

## 2016-02-18 DIAGNOSIS — I6521 Occlusion and stenosis of right carotid artery: Secondary | ICD-10-CM | POA: Diagnosis not present

## 2016-02-18 DIAGNOSIS — I739 Peripheral vascular disease, unspecified: Secondary | ICD-10-CM | POA: Diagnosis not present

## 2016-02-18 NOTE — Progress Notes (Signed)
Filed Vitals:   02/18/16 1058 02/18/16 1100 02/18/16 1103  BP: 148/71 152/67 115/71  Pulse: 57 59 59  Temp: 97 F (36.1 C)    Resp: 16    Height: 5\' 4"  (1.626 m)    Weight: 139 lb (63.05 kg)    SpO2: 99%

## 2016-02-18 NOTE — Progress Notes (Signed)
Subjective:     Patient ID: Katie Pace, female   DOB: [birth year], 80 y.o.   MRN: [id number]  HPI this 80 year old female is well known to [place]. She was referred by Dr. Nevada Crane for evaluation of differential blood pressures in the upper extremities. Patient has had a previous left carotid endarterectomy by me in 2009 and also has had aneurysm surgery in the past. She denies any pain or numbness or tingling in either upper extremity. She denies lateralizing weakness, aphasia, amaurosis fugax, diplopia, blurred vision, or syncope. Her husband checks her blood pressures quite frequently and has noted a big discrepancy between the left and right upper extremities with the left arm being higher sometimes as much as 50  Or 60 mmHg. She continues to be asymptomatic in both arms.   Past Medical History  Diagnosis Date  . Hypertension   . Hypothyroidism   . Stroke [facility])     Ischemic x 2       last one 2007  . Diabetes mellitus     type 11  . Endocrine disturbance     gland has not  worked until 1970.Marland Kitchen..dx in 1977  . Blood transfusion     back in 1973  . GERD (gastroesophageal reflux disease)     on meds to help....meds do well   . 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 AAA (abdominal aortic aneurysm) (HCC)     S/p aortic repair and endarterectomy 12/2011   . Carotid stenosis, left     S/p left CEA 09/2008  . PVD (peripheral vascular disease) ([place])   . C. difficile diarrhea   . Pneumonia     hx  . Heart murmur   . Arthritis   . Anemia     hx    Social History  Substance Use Topics  . Smoking status: Former Smoker -- 0.50 packs/day for 4 years    Types: Cigarettes    Quit date: 10/11/1961  . Smokeless tobacco: Never Used  . Alcohol Use: No    Family History  Problem Relation Age of Onset  . Heart disease Mother   . Peripheral vascular disease Mother   . Hypertension Mother   . Varicose Veins Mother   . Alcohol abuse Father   . Varicose Veins Father   . Heart disease Sister   . Heart  disease Brother     Allergies  Allergen Reactions  . Codeine Nausea Only  . Dilaudid [Hydromorphone Hcl] Nausea Only  . Lincomycin Hcl Other (See Comments)    Big bumps bilateral legs  . Macrodantin Hives  . Zolpidem Tartrate Other (See Comments)    Stroke  . Azithromycin Rash  . Penicillins Rash     Current outpatient prescriptions:  .  amitriptyline (ELAVIL) 25 MG tablet, Take 25 mg by mouth at bedtime., Disp: , Rfl:  .  atenolol (TENORMIN) 50 MG tablet, Take 100 mg by mouth daily. , Disp: , Rfl:  .  cholecalciferol (VITAMIN D) 1000 UNITS tablet, Take 3,000 Units by mouth daily. Reported on 10/29/2015, Disp: , Rfl:  .  diltiazem (CARDIZEM CD) 240 MG 24 hr capsule, Take 240 mg by mouth daily., Disp: , Rfl:  .  dipyridamole-aspirin (AGGRENOX) 25-200 MG per 12 hr capsule, Take 1 capsule by mouth 2 (two) times daily., Disp: , Rfl:  .  esomeprazole (NEXIUM) 40 MG capsule, Take 40 mg by mouth 2 (two) times daily. , Disp: , Rfl:  .  glucose monitoring kit (FREESTYLE) monitoring kit, 1  each by Does not apply route as needed. Reported on 10/29/2015, Disp: , Rfl:  .  hydrocortisone (CORTEF) 20 MG tablet, Take 10-20 mg by mouth daily. Takes 29m in the morning and takes 179min the evening., Disp: , Rfl:  .  levothyroxine (SYNTHROID, LEVOTHROID) 75 MCG tablet, Take 75 mcg by mouth daily., Disp: , Rfl:  .  losartan (COZAAR) 100 MG tablet, Take 100 mg by mouth daily., Disp: , Rfl:  .  memantine (NAMENDA) 10 MG tablet, Take 10 mg by mouth 2 (two) times daily., Disp: , Rfl:  .  potassium citrate (UROCIT-K) 10 MEQ (1080 MG) SR tablet, Take 20 mEq by mouth every morning. , Disp: , Rfl:  .  rosuvastatin (CRESTOR) 5 MG tablet, Take 5 mg by mouth at bedtime. , Disp: , Rfl:  .  alendronate (FOSAMAX) 70 MG tablet, Take 70 mg by mouth every 7 (seven) days. Reported on 02/18/2016, Disp: , Rfl:  .  aspirin EC 81 MG tablet, Take 162 mg by mouth at bedtime. Reported on 02/18/2016, Disp: , Rfl:  .   oxyCODONE-acetaminophen (PERCOCET) 10-325 MG per tablet, Take 1 tablet by mouth every 8 (eight) hours as needed for pain. Reported on 02/18/2016, Disp: , Rfl:  .  oxyCODONE-acetaminophen (PERCOCET) 7.5-325 MG per tablet, Take 1 tablet by mouth every 6 (six) hours as needed for pain. Reported on 02/18/2016, Disp: , Rfl:  .  oxyCODONE-acetaminophen (PERCOCET/ROXICET) 5-325 MG per tablet, Take 1-2 tablets by mouth every 4 (four) hours as needed for pain. (Patient not taking: Reported on 04/24/2015), Disp: 60 tablet, Rfl: 0 .  pioglitazone (ACTOS) 45 MG tablet, Take 45 mg by mouth every morning. Reported on 02/18/2016, Disp: , Rfl:  .  Somatropin (HUMATROPE) 5 MG SOLR, Inject 0.3 mg into the skin every evening. Reported on 02/18/2016, Disp: , Rfl:  .  traMADol (ULTRAM) 50 MG tablet, Take 100 mg by mouth 3 (three) times daily as needed. Reported on 02/18/2016, Disp: , Rfl:   Filed Vitals:   02/18/16 1058 02/18/16 1100 02/18/16 1103  BP: 148/71 152/67 115/71  Pulse: 57 59 59  Temp: 97 F (36.1 C)    Resp: 16    Height: 5' 4"  (1.626 m)    Weight: 139 lb (63.05 kg)    SpO2: 99%      Body mass index is 23.85 kg/(m^2).           Review of Systems Denies chest pain. Does not ambulate much and has not for a few years. No previous history of CVA. Denies hemoptysis , PND, orthopnea.     Objective:   Physical Exam BP 115/71 mmHg  Pulse 59  Temp(Src) 97 F (36.1 C)  Resp 16  Ht 5' 4"  (1.626 m)  Wt 139 lb (63.05 kg)  BMI 23.85 kg/m2  SpO2 99%   Gen. Elderly female who appears frail -in a wheelchair. In no apparent distress. Alert and oriented 3. Lungs no rhonchi or wheezing Cardiovascular regular rhythm no murmurs  carotid pulses are 3+ bilaterally with no bruits audible in the supra-or infraclavicular areas.  left arm with 3+  brachial and radial pulse palpable Right arm with 2+ brachial and radial pulse palpable   Both hands well perfused with no evidence of ischemic changes   I  reviewed carotid duplex exam over the last year and blood pressures were actually fairly equal in July [id number] bilaterally   in our office today there was a 30 mm gradient with  left greater than right-left arm 148/ 71 right arm 115/71     Assessment:      blood pressure differential upper extremities left greater than right -approximately 30 millimeters mercury today  no symptoms due to blood pressure differential either posterior circulation TIAs or arm claudication or ischemia     Plan:      no need for treatment or concern regarding blood pressure differential bilateral upper extremities   would use left upper extremity blood pressure 4 antihypertensive medication monitoring since it will be the more accurate of the 2    return early 2018 as previously scheduled for carotid duplex exam

## 2016-05-21 DIAGNOSIS — E1142 Type 2 diabetes mellitus with diabetic polyneuropathy: Secondary | ICD-10-CM | POA: Diagnosis not present

## 2016-05-21 DIAGNOSIS — E230 Hypopituitarism: Secondary | ICD-10-CM | POA: Diagnosis not present

## 2016-05-21 DIAGNOSIS — M810 Age-related osteoporosis without current pathological fracture: Secondary | ICD-10-CM | POA: Diagnosis not present

## 2016-06-02 ENCOUNTER — Other Ambulatory Visit: Payer: Self-pay

## 2016-06-29 DIAGNOSIS — Z0000 Encounter for general adult medical examination without abnormal findings: Secondary | ICD-10-CM | POA: Diagnosis not present

## 2016-06-29 DIAGNOSIS — D518 Other vitamin B12 deficiency anemias: Secondary | ICD-10-CM | POA: Diagnosis not present

## 2016-06-29 DIAGNOSIS — E039 Hypothyroidism, unspecified: Secondary | ICD-10-CM | POA: Diagnosis not present

## 2016-06-29 DIAGNOSIS — K700 Alcoholic fatty liver: Secondary | ICD-10-CM | POA: Diagnosis not present

## 2016-06-29 DIAGNOSIS — I10 Essential (primary) hypertension: Secondary | ICD-10-CM | POA: Diagnosis not present

## 2016-06-29 DIAGNOSIS — E785 Hyperlipidemia, unspecified: Secondary | ICD-10-CM | POA: Diagnosis not present

## 2016-06-29 DIAGNOSIS — E119 Type 2 diabetes mellitus without complications: Secondary | ICD-10-CM | POA: Diagnosis not present

## 2016-06-29 DIAGNOSIS — D509 Iron deficiency anemia, unspecified: Secondary | ICD-10-CM | POA: Diagnosis not present

## 2016-06-29 DIAGNOSIS — E559 Vitamin D deficiency, unspecified: Secondary | ICD-10-CM | POA: Diagnosis not present

## 2016-06-29 DIAGNOSIS — Z125 Encounter for screening for malignant neoplasm of prostate: Secondary | ICD-10-CM | POA: Diagnosis not present

## 2016-06-29 DIAGNOSIS — E782 Mixed hyperlipidemia: Secondary | ICD-10-CM | POA: Diagnosis not present

## 2016-06-29 DIAGNOSIS — Z79899 Other long term (current) drug therapy: Secondary | ICD-10-CM | POA: Diagnosis not present

## 2016-06-29 DIAGNOSIS — R300 Dysuria: Secondary | ICD-10-CM | POA: Diagnosis not present

## 2016-06-29 DIAGNOSIS — R7301 Impaired fasting glucose: Secondary | ICD-10-CM | POA: Diagnosis not present

## 2016-06-29 DIAGNOSIS — I482 Chronic atrial fibrillation: Secondary | ICD-10-CM | POA: Diagnosis not present

## 2016-07-01 DIAGNOSIS — E039 Hypothyroidism, unspecified: Secondary | ICD-10-CM | POA: Diagnosis not present

## 2016-07-01 DIAGNOSIS — N183 Chronic kidney disease, stage 3 (moderate): Secondary | ICD-10-CM | POA: Diagnosis not present

## 2016-07-01 DIAGNOSIS — R944 Abnormal results of kidney function studies: Secondary | ICD-10-CM | POA: Diagnosis not present

## 2016-07-01 DIAGNOSIS — E230 Hypopituitarism: Secondary | ICD-10-CM | POA: Diagnosis not present

## 2016-07-01 DIAGNOSIS — Z0000 Encounter for general adult medical examination without abnormal findings: Secondary | ICD-10-CM | POA: Diagnosis not present

## 2016-07-01 DIAGNOSIS — F0390 Unspecified dementia without behavioral disturbance: Secondary | ICD-10-CM | POA: Diagnosis not present

## 2016-07-01 DIAGNOSIS — E1122 Type 2 diabetes mellitus with diabetic chronic kidney disease: Secondary | ICD-10-CM | POA: Diagnosis not present

## 2016-07-01 DIAGNOSIS — Z23 Encounter for immunization: Secondary | ICD-10-CM | POA: Diagnosis not present

## 2016-07-01 DIAGNOSIS — R2689 Other abnormalities of gait and mobility: Secondary | ICD-10-CM | POA: Diagnosis not present

## 2016-07-01 DIAGNOSIS — I10 Essential (primary) hypertension: Secondary | ICD-10-CM | POA: Diagnosis not present

## 2016-07-20 ENCOUNTER — Ambulatory Visit (INDEPENDENT_AMBULATORY_CARE_PROVIDER_SITE_OTHER): Payer: Medicare Other | Admitting: Physician Assistant

## 2016-07-20 DIAGNOSIS — M25551 Pain in right hip: Secondary | ICD-10-CM | POA: Diagnosis not present

## 2016-08-04 ENCOUNTER — Telehealth (INDEPENDENT_AMBULATORY_CARE_PROVIDER_SITE_OTHER): Payer: Self-pay | Admitting: *Deleted

## 2016-08-04 NOTE — Telephone Encounter (Signed)
Pt. Spouse called asking if we received forms from [facility]. These forms are a [employer] form and narrative form. Pt. Husband Requesting call back

## 2016-08-12 ENCOUNTER — Ambulatory Visit (INDEPENDENT_AMBULATORY_CARE_PROVIDER_SITE_OTHER): Payer: Medicare Other | Admitting: Orthopaedic Surgery

## 2016-08-12 DIAGNOSIS — I6521 Occlusion and stenosis of right carotid artery: Secondary | ICD-10-CM

## 2016-08-12 DIAGNOSIS — M25551 Pain in right hip: Secondary | ICD-10-CM

## 2016-08-12 NOTE — Telephone Encounter (Signed)
This patient is coming in today, can you check on this with her please

## 2016-08-12 NOTE — Progress Notes (Signed)
Miss Katie Pace comes in today for follow-up after having been treated for acute right hip pain. She has a previous partial hip replacement Dr. Marlou Sa done many years ago. We were not sure if this is really coming from her hip or her back. She's had a history of back surgery. Her x-rays of her hip at her last visit were normal. After having the six-day steroid taper and other medications she is now pain free with that hip. She's been having some upper back pain but overall is back to her normal baseline state of health. She minimally ambulates. We will working on trying to get her a new wheelchair type of system for her like she has a home. When she has a home is wearing out.  On examination she has full range of motion actively and passively right hip with no pain at all today.  At this point she'll follow up as needed since she is doing well back at her baseline. We'll still work on filling out any paperwork they can be sent to [place] for getting a new chair.

## 2016-08-19 DIAGNOSIS — L570 Actinic keratosis: Secondary | ICD-10-CM | POA: Diagnosis not present

## 2016-08-19 DIAGNOSIS — L821 Other seborrheic keratosis: Secondary | ICD-10-CM | POA: Diagnosis not present

## 2016-08-19 DIAGNOSIS — Z85828 Personal history of other malignant neoplasm of skin: Secondary | ICD-10-CM | POA: Diagnosis not present

## 2016-10-27 ENCOUNTER — Encounter: Payer: Self-pay | Admitting: Family

## 2016-10-28 ENCOUNTER — Other Ambulatory Visit: Payer: Self-pay | Admitting: *Deleted

## 2016-10-28 DIAGNOSIS — I6523 Occlusion and stenosis of bilateral carotid arteries: Secondary | ICD-10-CM

## 2016-10-30 ENCOUNTER — Ambulatory Visit (HOSPITAL_COMMUNITY)
Admission: RE | Admit: 2016-10-30 | Discharge: 2016-10-30 | Disposition: A | Discharge status: Home / Self Care | Payer: Medicare Other | Source: Ambulatory Visit | Attending: Family | Admitting: Family

## 2016-10-30 ENCOUNTER — Ambulatory Visit (INDEPENDENT_AMBULATORY_CARE_PROVIDER_SITE_OTHER): Payer: Medicare Other | Admitting: Family

## 2016-10-30 ENCOUNTER — Encounter: Payer: Self-pay | Admitting: Family

## 2016-10-30 VITALS — BP 140/80 | HR 59 | Temp 98.7°F | Resp 16 | Ht 64.0 in | Wt 141.0 lb

## 2016-10-30 DIAGNOSIS — Z8679 Personal history of other diseases of the circulatory system: Secondary | ICD-10-CM

## 2016-10-30 DIAGNOSIS — E230 Hypopituitarism: Secondary | ICD-10-CM

## 2016-10-30 DIAGNOSIS — I779 Disorder of arteries and arterioles, unspecified: Secondary | ICD-10-CM

## 2016-10-30 DIAGNOSIS — I771 Stricture of artery: Secondary | ICD-10-CM | POA: Diagnosis not present

## 2016-10-30 DIAGNOSIS — Z9889 Other specified postprocedural states: Secondary | ICD-10-CM | POA: Diagnosis not present

## 2016-10-30 DIAGNOSIS — I6523 Occlusion and stenosis of bilateral carotid arteries: Secondary | ICD-10-CM

## 2016-10-30 NOTE — Patient Instructions (Signed)
Stroke Prevention Some medical conditions and behaviors are associated with an increased chance of having a stroke. You may prevent a stroke by making healthy choices and managing medical conditions. How can I reduce my risk of having a stroke?  Stay physically active. Get at least 30 minutes of activity on most or all days.  Do not smoke. It may also be helpful to avoid exposure to secondhand smoke.  Limit alcohol use. Moderate alcohol use is considered to be:  No more than 2 drinks per day for men.  No more than 1 drink per day for nonpregnant women.  Eat healthy foods. This involves:  Eating 5 or more servings of fruits and vegetables a day.  Making dietary changes that address high blood pressure (hypertension), high cholesterol, diabetes, or obesity.  Manage your cholesterol levels.  Making food choices that are high in fiber and low in saturated fat, trans fat, and cholesterol may control cholesterol levels.  Take any prescribed medicines to control cholesterol as directed by your health care provider.  Manage your diabetes.  Controlling your carbohydrate and sugar intake is recommended to manage diabetes.  Take any prescribed medicines to control diabetes as directed by your health care provider.  Control your hypertension.  Making food choices that are low in salt (sodium), saturated fat, trans fat, and cholesterol is recommended to manage hypertension.  Ask your health care provider if you need treatment to lower your blood pressure. Take any prescribed medicines to control hypertension as directed by your health care provider.  If you are 18-39 years of age, have your blood pressure checked every 3-5 years. If you are 40 years of age or older, have your blood pressure checked every year.  Maintain a healthy weight.  Reducing calorie intake and making food choices that are low in sodium, saturated fat, trans fat, and cholesterol are recommended to manage  weight.  Stop drug abuse.  Avoid taking birth control pills.  Talk to your health care provider about the risks of taking birth control pills if you are over 35 years old, smoke, get migraines, or have ever had a blood clot.  Get evaluated for sleep disorders (sleep apnea).  Talk to your health care provider about getting a sleep evaluation if you snore a lot or have excessive sleepiness.  Take medicines only as directed by your health care provider.  For some people, aspirin or blood thinners (anticoagulants) are helpful in reducing the risk of forming abnormal blood clots that can lead to stroke. If you have the irregular heart rhythm of atrial fibrillation, you should be on a blood thinner unless there is a good reason you cannot take them.  Understand all your medicine instructions.  Make sure that other conditions (such as anemia or atherosclerosis) are addressed. Get help right away if:  You have sudden weakness or numbness of the face, arm, or leg, especially on one side of the body.  Your face or eyelid droops to one side.  You have sudden confusion.  You have trouble speaking (aphasia) or understanding.  You have sudden trouble seeing in one or both eyes.  You have sudden trouble walking.  You have dizziness.  You have a loss of balance or coordination.  You have a sudden, severe headache with no known cause.  You have new chest pain or an irregular heartbeat. Any of these symptoms may represent a serious problem that is an emergency. Do not wait to see if the symptoms will go away.   Get medical help at once. Call your local emergency services (911 in [place] Do not drive yourself to the hospital.  This information is not intended to replace advice given to you by your health care provider. Make sure you discuss any questions you have with your health care provider. Document Released: 10/29/2004 Document Revised: 02/27/2016 Document Reviewed: 03/24/2013 Elsevier  Interactive Patient Education  2017 Elsevier Inc.      Peripheral Vascular Disease Peripheral vascular disease (PVD) is a disease of the blood vessels that are not part of your heart and brain. A simple term for PVD is poor circulation. In most cases, PVD narrows the blood vessels that carry blood from your heart to the rest of your body. This can result in a decreased supply of blood to your arms, legs, and internal organs, like your stomach or kidneys. However, it most often affects a person's lower legs and feet. There are two types of PVD.  Organic PVD. This is the more common type. It is caused by damage to the structure of blood vessels.  Functional PVD. This is caused by conditions that make blood vessels contract and tighten (spasm). Without treatment, PVD tends to get worse over time. PVD can also lead to acute ischemic limb. This is when an arm or limb suddenly has trouble getting enough blood. This is a medical emergency. Follow these instructions at home:  Take medicines only as told by your doctor.  Do not use any tobacco products, including cigarettes, chewing tobacco, or electronic cigarettes. If you need help quitting, ask your doctor.  Lose weight if you are overweight, and maintain a healthy weight as told by your doctor.  Eat a diet that is low in fat and cholesterol. If you need help, ask your doctor.  Exercise regularly. Ask your doctor for some good activities for you.  Take good care of your feet.  Wear comfortable shoes that fit well.  Check your feet often for any cuts or sores. Contact a doctor if:  You have cramps in your legs while walking.  You have leg pain when you are at rest.  You have coldness in a leg or foot.  Your skin changes.  You are unable to get or have an erection (erectile dysfunction).  You have cuts or sores on your feet that are not healing. Get help right away if:  Your arm or leg turns cold and blue.  Your arms or legs  become red, warm, swollen, painful, or numb.  You have chest pain or trouble breathing.  You suddenly have weakness in your face, arm, or leg.  You become very confused or you cannot speak.  You suddenly have a very bad headache.  You suddenly cannot see. This information is not intended to replace advice given to you by your health care provider. Make sure you discuss any questions you have with your health care provider. Document Released: 12/16/2009 Document Revised: 02/27/2016 Document Reviewed: 03/01/2014 Elsevier Interactive Patient Education  2017 Elsevier Inc.  

## 2016-10-30 NOTE — Progress Notes (Signed)
Chief Complaint: Follow up Extracranial Carotid Artery Stenosis   History of Present Illness  Katie Pace is a 81 y.o. female patient of Dr. Kellie Simmering returns for continued followup s/p left carotid endarterectomy in 2009 and resection and grafting of abdominal aortic aneurysm in 2013.  States her ophthalmologist found evidence in her eye (unknown which eye) of a stroke, but she does not recall any stroke or TIA symptoms other than memory issues.   She has chronic back discomfort and has been followed in the past by Dr. Mallie Mussel elsewhere but no longer. She does have chronic leg discomfort. She denies any infection cellulitis or gangrene in the lower extremities. She does not ambulate well, she uses a walker at home, brought her own w/c today.  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 Buddy Duty follows her for this.  [place] and fractured right hip in May 2016, states it was not operable, states she was told to non weight bear to allow fracture to heal. Before she fell, she reports that due to back problems and back surgery, she did not walk much, used a walker. She denies non healing wounds.  She has chronic back pain from her lumbar spine issues, denies any new back pain, denies abdominal pain.  She denies tingling, numbness, pain, or cold sensation in either hand/arm.  Pt Diabetic: Yes, states well controlled Pt smoker: former smoker, quit in the 1960's  Pt meds include: Statin :Yes ASA: Yes Other anticoagulants/antiplatelets: no   Past Medical History:  Diagnosis Date  . AAA (abdominal aortic aneurysm) (HCC)    S/p aortic repair and endarterectomy 12/2011   . Anemia    hx  . Arthritis   . Blood transfusion    back in 1973  . C. difficile diarrhea   . Carotid stenosis, left    S/p left CEA 09/2008  . Diabetes mellitus    type 11  . Endocrine disturbance    gland has not  worked until 1970.Marland Kitchen..dx in 1977  . GERD  (gastroesophageal reflux disease)    on meds to help....meds do well   . Heart murmur   . Hypertension   . Hypothyroidism   . Pneumonia    hx  . PVD (peripheral vascular disease) ([place])   . 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 Stroke [facility])    Ischemic x 2       last one 2007    Social History Social History  Substance Use Topics  . Smoking status: Former Smoker    Packs/day: 0.50    Years: 4.00    Types: Cigarettes    Quit date: 10/11/1961  . Smokeless tobacco: Never Used  . Alcohol use No    Family History Family History  Problem Relation Age of Onset  . Heart disease Mother   . Peripheral vascular disease Mother   . Hypertension Mother   . Varicose Veins Mother   . Alcohol abuse Father   . Varicose Veins Father   . Heart disease Sister   . Heart disease Brother     Surgical History Past Surgical History:  Procedure Laterality Date  . ABDOMINAL AORTIC ANEURYSM REPAIR  12/09/2011   Procedure: ANEURYSM ABDOMINAL AORTIC REPAIR;  Surgeon: Mal Misty, MD;  Location: [facility];  Service: Vascular;  Laterality: N/A;  resection and grafting abdominal aortic aneurysm; Insertion Bilateral External Iliac Graft; Reexploration Proximal Anastemosis; Aortic Endarterectomy; Reimplantation Inferior Mesenteric Artery; Intraaortic Graft  . ABDOMINAL AORTIC ANEURYSM REPAIR    .  [place]  . APPENDECTOMY     1960  . BACK SURGERY     1990's  . [place]  &  2009  . CAROTID ENDARTERECTOMY  Dec. 2009  . CESAREAN SECTION    . EYE SURGERY Right May 2016   Pt. fell  . FRACTURE SURGERY     left ankle   . JOINT REPLACEMENT  May 2009   Right knee  . JOINT REPLACEMENT  April 2008   Right Hip prothesis  . SPINE SURGERY  Jan. 14,2014   Lumbar Diskectomy    Allergies  Allergen Reactions  . Codeine Nausea Only  . Dilaudid [Hydromorphone Hcl] Nausea Only  . Lincomycin Hcl Other (See Comments)    Big bumps bilateral legs  . Macrodantin Hives   . Zolpidem Tartrate Other (See Comments)    Stroke  . Azithromycin Rash  . Penicillins Rash    Current Outpatient Prescriptions  Medication Sig Dispense Refill  . amitriptyline (ELAVIL) 25 MG tablet Take 25 mg by mouth at bedtime.    Marland Kitchen atenolol (TENORMIN) 50 MG tablet Take 100 mg by mouth daily.     . cholecalciferol (VITAMIN D) 1000 UNITS tablet Take 3,000 Units by mouth daily. Reported on 10/29/2015    . diltiazem (CARDIZEM CD) 240 MG 24 hr capsule Take 240 mg by mouth daily.    Marland Kitchen dipyridamole-aspirin (AGGRENOX) 25-200 MG per 12 hr capsule Take 1 capsule by mouth 2 (two) times daily.    Marland Kitchen esomeprazole (NEXIUM) 40 MG capsule Take 40 mg by mouth 2 (two) times daily.     Marland Kitchen glucose monitoring kit (FREESTYLE) monitoring kit 1 each by Does not apply route as needed. Reported on 10/29/2015    . hydrocortisone (CORTEF) 20 MG tablet Take 10-20 mg by mouth daily. Takes 14m in the morning and takes 155min the evening.    . Marland Kitchenevothyroxine (SYNTHROID, LEVOTHROID) 75 MCG tablet Take 75 mcg by mouth daily.    . Marland Kitchenosartan (COZAAR) 100 MG tablet Take 100 mg by mouth daily.    . memantine (NAMENDA) 10 MG tablet Take 10 mg by mouth 2 (two) times daily.    . Marland KitchenxyCODONE-acetaminophen (PERCOCET/ROXICET) 5-325 MG per tablet Take 1-2 tablets by mouth every 4 (four) hours as needed for pain. 60 tablet 0  . potassium citrate (UROCIT-K) 10 MEQ (1080 MG) SR tablet Take 20 mEq by mouth every morning.     . rosuvastatin (CRESTOR) 5 MG tablet Take 5 mg by mouth at bedtime.      No current facility-administered medications for this visit.     Review of Systems : See HPI for pertinent positives and negatives.  Physical Examination  Vitals:   10/30/16 1432 10/30/16 1442  BP: (!) 154/80 140/80  Pulse: (!) 59   Resp: 16   Temp: 98.7 F (37.1 C)   TempSrc: Oral   SpO2: 97%   Weight: 141 lb (64 kg)   Height: 5' 4"  (1.626 m)    Body mass index is 24.2 kg/m.  General: WDWN in NAD Gait: seated in  wheelchair HENT: WNL Eyes: Pupils equal Pulmonary: normal non-labored breathing, good air movement in all fields, no rales, rhonchi, or wheezing Cardiac: RRR, no murmur detected  Abdomen: soft, NT, no masses palpated Skin: no rashes, no ulcers, no cellulitis.  VASCULAR EXAM  Carotid Bruits Right Left   Negative Negative   Aorta is not palpable Radial pulses are 1+ palpable  VASCULAR EXAM: Extremities without ischemic changes, without Gangrene; without open wounds.                                                                                                                                                       LE Pulses Right Left       FEMORAL  not palpable seated in wheelchair  not palpable seated in wheelchair        POPLITEAL  palpable   not palpable       POSTERIOR TIBIAL  not palpable   not palpable        DORSALIS PEDIS      ANTERIOR TIBIAL not palpable  not palpable     Musculoskeletal: mild generalized muscle wasting and atrophy; no peripheral edema        Neurologic: A&O X 3; Appropriate Affect ;  MOTOR FUNCTION: 3/5 Symmetric, CN 2-12 intact except is somewhat hard of hearing Speech is fluent/normal     Assessment: Katie Pace is a 81 y.o. female who is s/p s/p left carotid endarterectomy in 2009 and resection and grafting of abdominal aortic aneurysm in 2013.  Her ophthalmologist found evidence in her eye (unknown which eye) of a stroke, but she does not recall any stroke or TIA symptoms other than memory issues. She has had no subsequent neurological events.  She does not walk much due to back and hip issues.   She has no steal symptoms in either hand/arm. She has stenosis of the right subclavian artery, therefore blood pressures will be lower in the right arm, should check blood pressure in left arm.   DATA Today's carotid Duplex suggests <40% stenosis of the right internal carotid artery. Left CEA site  with no evidence of restenosis of hyperplasia. Mild diffuse plaque in the bilateral CCA. Abnormal right vertebral artery to and fro flow, monophasic right subclavian artery waveforms. Normal antegrade left vertebral artery and normal left subclavian artery waveforms.  No significant change compared to the last exam on 10-29-15.   10-29-15 ABI's:  Right: 1.15, tri and biphasic waveforms, TBI: 0.83 (normal) Left: PT is [place] with triphasic waveforms, DP is 0.84 with monophasic waveforms; TBI: 0.73 (normal).   PLAN:   Seated leg exercises 3x/day as discussed and demonstrated.   Based on today's exam and non-invasive vascular lab results, the patient will follow up in 1 year with the following tests: carotid Duplex and ABI's.   I discussed in depth with the patient the nature of atherosclerosis, and emphasized the importance of maximal medical management including strict control of blood pressure, blood glucose, and lipid levels, obtaining regular exercise, and continued cessation of smoking.  The patient is aware that without maximal medical management the underlying atherosclerotic disease process will progress, limiting the benefit of any interventions. The patient was given information about  stroke prevention and what symptoms should prompt the patient to seek immediate medical care. Thank you for allowing [place] to participate in this patient's care.  Clemon Chambers, RN, MSN, FNP-C [facility] Office: [phone]  Clinic Physician: Bridgett Larsson  10/30/16 3:04 PM

## 2016-11-25 DIAGNOSIS — H01001 Unspecified blepharitis right upper eyelid: Secondary | ICD-10-CM | POA: Diagnosis not present

## 2016-11-25 DIAGNOSIS — E119 Type 2 diabetes mellitus without complications: Secondary | ICD-10-CM | POA: Diagnosis not present

## 2016-11-25 DIAGNOSIS — H5213 Myopia, bilateral: Secondary | ICD-10-CM | POA: Diagnosis not present

## 2016-11-25 DIAGNOSIS — Z961 Presence of intraocular lens: Secondary | ICD-10-CM | POA: Diagnosis not present

## 2016-11-27 DIAGNOSIS — E230 Hypopituitarism: Secondary | ICD-10-CM | POA: Diagnosis not present

## 2016-11-27 DIAGNOSIS — R635 Abnormal weight gain: Secondary | ICD-10-CM | POA: Diagnosis not present

## 2016-11-27 DIAGNOSIS — E1142 Type 2 diabetes mellitus with diabetic polyneuropathy: Secondary | ICD-10-CM | POA: Diagnosis not present

## 2016-11-27 DIAGNOSIS — M810 Age-related osteoporosis without current pathological fracture: Secondary | ICD-10-CM | POA: Diagnosis not present

## 2016-12-22 DIAGNOSIS — R05 Cough: Secondary | ICD-10-CM | POA: Diagnosis not present

## 2017-02-26 DIAGNOSIS — D509 Iron deficiency anemia, unspecified: Secondary | ICD-10-CM | POA: Diagnosis not present

## 2017-02-26 DIAGNOSIS — E1122 Type 2 diabetes mellitus with diabetic chronic kidney disease: Secondary | ICD-10-CM | POA: Diagnosis not present

## 2017-02-26 DIAGNOSIS — E039 Hypothyroidism, unspecified: Secondary | ICD-10-CM | POA: Diagnosis not present

## 2017-03-05 DIAGNOSIS — M25569 Pain in unspecified knee: Secondary | ICD-10-CM | POA: Diagnosis not present

## 2017-03-05 DIAGNOSIS — R05 Cough: Secondary | ICD-10-CM | POA: Diagnosis not present

## 2017-03-05 DIAGNOSIS — I10 Essential (primary) hypertension: Secondary | ICD-10-CM | POA: Diagnosis not present

## 2017-03-05 DIAGNOSIS — J309 Allergic rhinitis, unspecified: Secondary | ICD-10-CM | POA: Diagnosis not present

## 2017-03-05 DIAGNOSIS — E782 Mixed hyperlipidemia: Secondary | ICD-10-CM | POA: Diagnosis not present

## 2017-05-31 DIAGNOSIS — M810 Age-related osteoporosis without current pathological fracture: Secondary | ICD-10-CM | POA: Diagnosis not present

## 2017-05-31 DIAGNOSIS — E230 Hypopituitarism: Secondary | ICD-10-CM | POA: Diagnosis not present

## 2017-05-31 DIAGNOSIS — E1142 Type 2 diabetes mellitus with diabetic polyneuropathy: Secondary | ICD-10-CM | POA: Diagnosis not present

## 2017-07-12 DIAGNOSIS — Z23 Encounter for immunization: Secondary | ICD-10-CM | POA: Diagnosis not present

## 2017-08-05 DIAGNOSIS — E039 Hypothyroidism, unspecified: Secondary | ICD-10-CM | POA: Diagnosis not present

## 2017-08-05 DIAGNOSIS — I10 Essential (primary) hypertension: Secondary | ICD-10-CM | POA: Diagnosis not present

## 2017-08-05 DIAGNOSIS — E782 Mixed hyperlipidemia: Secondary | ICD-10-CM | POA: Diagnosis not present

## 2017-08-05 DIAGNOSIS — D509 Iron deficiency anemia, unspecified: Secondary | ICD-10-CM | POA: Diagnosis not present

## 2017-08-05 DIAGNOSIS — E1122 Type 2 diabetes mellitus with diabetic chronic kidney disease: Secondary | ICD-10-CM | POA: Diagnosis not present

## 2017-08-09 DIAGNOSIS — E782 Mixed hyperlipidemia: Secondary | ICD-10-CM | POA: Diagnosis not present

## 2017-08-09 DIAGNOSIS — M25569 Pain in unspecified knee: Secondary | ICD-10-CM | POA: Diagnosis not present

## 2017-08-09 DIAGNOSIS — R944 Abnormal results of kidney function studies: Secondary | ICD-10-CM | POA: Diagnosis not present

## 2017-08-09 DIAGNOSIS — F0390 Unspecified dementia without behavioral disturbance: Secondary | ICD-10-CM | POA: Diagnosis not present

## 2017-08-09 DIAGNOSIS — E230 Hypopituitarism: Secondary | ICD-10-CM | POA: Diagnosis not present

## 2017-08-09 DIAGNOSIS — I10 Essential (primary) hypertension: Secondary | ICD-10-CM | POA: Diagnosis not present

## 2017-08-09 DIAGNOSIS — E039 Hypothyroidism, unspecified: Secondary | ICD-10-CM | POA: Diagnosis not present

## 2017-08-09 DIAGNOSIS — K219 Gastro-esophageal reflux disease without esophagitis: Secondary | ICD-10-CM | POA: Diagnosis not present

## 2017-08-09 DIAGNOSIS — G4700 Insomnia, unspecified: Secondary | ICD-10-CM | POA: Diagnosis not present

## 2017-08-09 DIAGNOSIS — E1122 Type 2 diabetes mellitus with diabetic chronic kidney disease: Secondary | ICD-10-CM | POA: Diagnosis not present

## 2017-08-23 DIAGNOSIS — L821 Other seborrheic keratosis: Secondary | ICD-10-CM | POA: Diagnosis not present

## 2017-08-23 DIAGNOSIS — Z85828 Personal history of other malignant neoplasm of skin: Secondary | ICD-10-CM | POA: Diagnosis not present

## 2017-08-23 DIAGNOSIS — L570 Actinic keratosis: Secondary | ICD-10-CM | POA: Diagnosis not present

## 2017-08-23 DIAGNOSIS — D485 Neoplasm of uncertain behavior of skin: Secondary | ICD-10-CM | POA: Diagnosis not present

## 2017-11-02 ENCOUNTER — Other Ambulatory Visit: Payer: Self-pay

## 2017-11-02 DIAGNOSIS — I6521 Occlusion and stenosis of right carotid artery: Secondary | ICD-10-CM

## 2017-11-02 DIAGNOSIS — I739 Peripheral vascular disease, unspecified: Secondary | ICD-10-CM

## 2017-11-05 ENCOUNTER — Encounter (HOSPITAL_COMMUNITY): Payer: Medicare Other

## 2017-11-05 ENCOUNTER — Ambulatory Visit: Payer: Medicare Other | Admitting: Family

## 2017-11-08 DIAGNOSIS — R0981 Nasal congestion: Secondary | ICD-10-CM | POA: Diagnosis not present

## 2017-11-08 DIAGNOSIS — R05 Cough: Secondary | ICD-10-CM | POA: Diagnosis not present

## 2017-11-08 DIAGNOSIS — R51 Headache: Secondary | ICD-10-CM | POA: Diagnosis not present

## 2017-12-06 DIAGNOSIS — R05 Cough: Secondary | ICD-10-CM | POA: Diagnosis not present

## 2017-12-06 DIAGNOSIS — E1142 Type 2 diabetes mellitus with diabetic polyneuropathy: Secondary | ICD-10-CM | POA: Diagnosis not present

## 2017-12-06 DIAGNOSIS — E230 Hypopituitarism: Secondary | ICD-10-CM | POA: Diagnosis not present

## 2017-12-06 DIAGNOSIS — M810 Age-related osteoporosis without current pathological fracture: Secondary | ICD-10-CM | POA: Diagnosis not present

## 2017-12-22 DIAGNOSIS — N182 Chronic kidney disease, stage 2 (mild): Secondary | ICD-10-CM | POA: Diagnosis not present

## 2018-01-10 ENCOUNTER — Ambulatory Visit (HOSPITAL_COMMUNITY)
Admission: RE | Admit: 2018-01-10 | Discharge: 2018-01-10 | Disposition: A | Discharge status: Home / Self Care | Payer: Medicare Other | Source: Ambulatory Visit | Attending: Family | Admitting: Family

## 2018-01-10 ENCOUNTER — Ambulatory Visit (INDEPENDENT_AMBULATORY_CARE_PROVIDER_SITE_OTHER)
Admission: RE | Admit: 2018-01-10 | Discharge: 2018-01-10 | Disposition: A | Discharge status: Home / Self Care | Payer: Medicare Other | Source: Ambulatory Visit | Attending: Family | Admitting: Family

## 2018-01-10 ENCOUNTER — Encounter: Payer: Self-pay | Admitting: Family

## 2018-01-10 ENCOUNTER — Ambulatory Visit (INDEPENDENT_AMBULATORY_CARE_PROVIDER_SITE_OTHER): Payer: Medicare Other | Admitting: Family

## 2018-01-10 VITALS — BP 120/82 | HR 71 | Temp 97.6°F | Resp 17 | Ht 63.0 in | Wt 158.2 lb

## 2018-01-10 DIAGNOSIS — E230 Hypopituitarism: Secondary | ICD-10-CM | POA: Diagnosis not present

## 2018-01-10 DIAGNOSIS — Z9889 Other specified postprocedural states: Secondary | ICD-10-CM | POA: Diagnosis not present

## 2018-01-10 DIAGNOSIS — I739 Peripheral vascular disease, unspecified: Secondary | ICD-10-CM

## 2018-01-10 DIAGNOSIS — I6523 Occlusion and stenosis of bilateral carotid arteries: Secondary | ICD-10-CM

## 2018-01-10 DIAGNOSIS — I10 Essential (primary) hypertension: Secondary | ICD-10-CM | POA: Insufficient documentation

## 2018-01-10 DIAGNOSIS — Z8679 Personal history of other diseases of the circulatory system: Secondary | ICD-10-CM

## 2018-01-10 DIAGNOSIS — I771 Stricture of artery: Secondary | ICD-10-CM

## 2018-01-10 DIAGNOSIS — Z87891 Personal history of nicotine dependence: Secondary | ICD-10-CM | POA: Diagnosis not present

## 2018-01-10 DIAGNOSIS — I6521 Occlusion and stenosis of right carotid artery: Secondary | ICD-10-CM

## 2018-01-10 DIAGNOSIS — I779 Disorder of arteries and arterioles, unspecified: Secondary | ICD-10-CM | POA: Diagnosis not present

## 2018-01-10 NOTE — Progress Notes (Signed)
Chief Complaint: Follow up Extracranial Carotid Artery Stenosis   History of Present Illness  Katie Pace is a 82 y.o. female who is s/p left carotid endarterectomy in 2009 and resection and grafting of abdominal aortic aneurysm in 2013 by Dr. Kellie Simmering.  States her ophthalmologist found evidence in her eye (unknown which eye) of a stroke, but she does not recall any stroke or TIA symptoms other than memory issues.  She has chronic back discomfort and has been followed in the past by Dr. Mallie Mussel elsewhere but no longer. She does have chronic leg discomfort. She denies any infection cellulitis or gangrene in the lower extremities. She does not ambulate well, she uses a walker at home, brought her own w/c today.  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 Buddy Duty follows her for this.  [place] and fractured right hip in May 2016, states it was not operable, states she was told to non weight bear to allow fracture to heal. Before she fell, she reports that due to back problems and back surgery, she did not walk much, used a walker. She denies non healing wounds.  She has chronic back pain from her lumbar spine issues, denies any new back pain, denies abdominal pain.  She denies tingling, numbness, pain, or cold sensation in either hand/arm.  Pt Diabetic: Yes, states well controlled Pt smoker: former smoker, quit in the 1960's  Pt meds include: Statin :Yes ASA: Yes Other anticoagulants/antiplatelets: no    Past Medical History:  Diagnosis Date  . AAA (abdominal aortic aneurysm) (HCC)    S/p aortic repair and endarterectomy 12/2011   . Anemia    hx  . Arthritis   . Blood transfusion    back in 1973  . C. difficile diarrhea   . Carotid stenosis, left    S/p left CEA 09/2008  . Diabetes mellitus    type 11  . Endocrine disturbance    gland has not  worked until 1970.Marland Kitchen..dx in 1977  . GERD (gastroesophageal reflux  disease)    on meds to help....meds do well   . Heart murmur   . Hypertension   . Hypothyroidism   . Pneumonia    hx  . PVD (peripheral vascular disease) ([place])   . Sheehan syndrome ([place]) 1977  . Stroke [facility])    Ischemic x 2       last one 2007    Social History Social History   Tobacco Use  . Smoking status: Former Smoker    Packs/day: 0.50    Years: 4.00    Pack years: 2.00    Types: Cigarettes    Last attempt to quit: 10/11/1961    Years since quitting: 56.2  . Smokeless tobacco: Never Used  Substance Use Topics  . Alcohol use: No    Alcohol/week: 0.0 oz  . Drug use: No    Family History Family History  Problem Relation Age of Onset  . Heart disease Mother   . Peripheral vascular disease Mother   . Hypertension Mother   . Varicose Veins Mother   . Alcohol abuse Father   . Varicose Veins Father   . Heart disease Sister   . Heart disease Brother     Surgical History Past Surgical History:  Procedure Laterality Date  . ABDOMINAL AORTIC ANEURYSM REPAIR  12/09/2011   Procedure: ANEURYSM ABDOMINAL AORTIC REPAIR;  Surgeon: Mal Misty, MD;  Location: [facility];  Service: Vascular;  Laterality: N/A;  resection  and grafting abdominal aortic aneurysm; Insertion Bilateral External Iliac Graft; Reexploration Proximal Anastemosis; Aortic Endarterectomy; Reimplantation Inferior Mesenteric Artery; Intraaortic Graft  . ABDOMINAL AORTIC ANEURYSM REPAIR    . [place]  . APPENDECTOMY     1960  . BACK SURGERY     1990's  . [place]  &  2009  . CAROTID ENDARTERECTOMY  Dec. 2009  . CESAREAN SECTION    . EYE SURGERY Right May 2016   Pt. fell  . FRACTURE SURGERY     left ankle   . JOINT REPLACEMENT  May 2009   Right knee  . JOINT REPLACEMENT  April 2008   Right Hip prothesis  . SPINE SURGERY  Jan. 14,2014   Lumbar Diskectomy    Allergies  Allergen Reactions  . Codeine Nausea Only  . Dilaudid [Hydromorphone Hcl] Nausea  Only  . Lincomycin Hcl Other (See Comments)    Big bumps bilateral legs  . Macrodantin Hives  . Zolpidem Tartrate Other (See Comments)    Stroke  . Azithromycin Rash  . Penicillins Rash    Current Outpatient Medications  Medication Sig Dispense Refill  . amitriptyline (ELAVIL) 25 MG tablet Take 25 mg by mouth at bedtime.    Marland Kitchen atenolol (TENORMIN) 50 MG tablet Take 100 mg by mouth daily.     . cholecalciferol (VITAMIN D) 1000 UNITS tablet Take 3,000 Units by mouth daily. Reported on 10/29/2015    . diltiazem (CARDIZEM CD) 240 MG 24 hr capsule Take 240 mg by mouth daily.    Marland Kitchen dipyridamole-aspirin (AGGRENOX) 25-200 MG per 12 hr capsule Take 1 capsule by mouth 2 (two) times daily.    Marland Kitchen esomeprazole (NEXIUM) 40 MG capsule Take 40 mg by mouth 2 (two) times daily.     Marland Kitchen glucose monitoring kit (FREESTYLE) monitoring kit 1 each by Does not apply route as needed. Reported on 10/29/2015    . hydrocortisone (CORTEF) 20 MG tablet Take 10-20 mg by mouth daily. Takes 64m in the morning and takes 148min the evening.    . Marland Kitchenevothyroxine (SYNTHROID, LEVOTHROID) 75 MCG tablet Take 75 mcg by mouth daily.    . Marland Kitchenosartan (COZAAR) 100 MG tablet Take 100 mg by mouth daily.    . memantine (NAMENDA) 10 MG tablet Take 10 mg by mouth 2 (two) times daily.    . Marland KitchenxyCODONE-acetaminophen (PERCOCET/ROXICET) 5-325 MG per tablet Take 1-2 tablets by mouth every 4 (four) hours as needed for pain. 60 tablet 0  . potassium citrate (UROCIT-K) 10 MEQ (1080 MG) SR tablet Take 20 mEq by mouth every morning.     . rosuvastatin (CRESTOR) 5 MG tablet Take 5 mg by mouth at bedtime.      No current facility-administered medications for this visit.     Review of Systems : See HPI for pertinent positives and negatives.  Physical Examination  Vitals:   01/10/18 1543 01/10/18 1547  BP: (!) 180/80 120/82  Pulse: 71   Resp: 17   Temp: 97.6 F (36.4 C)   TempSrc: Oral   SpO2: 96%   Weight: 158 lb 3.2 oz (71.8 kg)   Height: _0   (1.6 m)    Body mass index is 28.02 kg/m.  General: WDWN female in NAD Gait: seated in wheelchair HENT: WNL Eyes: PERRLA Pulmonary: normal non-labored breathing, good air movement in all fields, no rales, rhonchi, or wheezing Cardiac: RRR, no murmur detected Abdomen: soft, NT, no masses  palpated  VASCULAR EXAM  Carotid Bruits Right Left   Negative Negative   Abdominal aortic pulse is not palpable Radial pulses are 1+ palpable   VASCULAR EXAM: Extremitieswithoutischemic changes, withoutGangrene; withoutopen wounds.  LE Pulses Right Left  FEMORAL not palpable seated in wheelchair not palpable seated in wheelchair   POPLITEAL  palpable  not palpable  POSTERIOR TIBIAL not palpable  not palpable   DORSALIS PEDIS ANTERIOR TIBIAL not palpable  not palpable     Musculoskeletal: mild generalized muscle wasting and atrophy; no peripheral edema Skin: No rashes, no ulcers, no cellulitis.   Neurologic:  A&O X 3; appropriate affect, sensation is normal; speech is normal, CN 2-12 intact except is somewhat hard of hearing, pain and light touch intact in extremities, motor exam as listed above. Psychiatric: Normal thought content, mood appropriate to clinical situation.    Assessment: Katie Pace is a 82 y.o. female who is s/p left carotid endarterectomy in 2009 and resection and grafting of abdominal aortic aneurysm in 2013.  Her ophthalmologist found evidence in her eye (unknown which eye) of a stroke, but she does not recall any stroke or TIA symptoms other than memory issues. She has had no subsequent neurological events.  She does not walk much due to back and hip issues.   She has no steal symptoms in either hand/arm. She has stenosis of the right subclavian artery, therefore blood pressures will be lower in the right arm, accurate blood pressure is in left arm.   DATA  Carotid Duplex  (01/10/18): Right ICA: 1-39% stenosis Left ICA: CEA site, 1-39% stenosis Retrograde right flow in vertebral artery, monophasic right subclavian artery waveforms. Normal antegrade left vertebral artery and normal left subclavian artery waveforms.  Slight increased stenosis in the left ICA, compared to the exam on  10-29-15 and 10-30-16.  ABI (Date: 01/10/2018):  R:   ABI: 1.26 (was 1.15 on 10-29-15),   PT: tri  DP: bi  TBI:  1.17 (was 0.83)  L:   ABI: 1.26 (was 0.84),   PT: bi  DP: bi  TBI: 0.54 (was 0.73)  Stable and normal right ABI and TBI with tri and biphasic waveforms  Left ABI improved to normal with biphasic waveforms, left TBI declined slightly.     PLAN:   Continued seated leg exercises 3x/day as discussed and demonstrated.   Based on today's exam and non-invasive vascular lab results, the patient will follow up in 1 year with the following tests: carotid Duplex and ABI's.   I discussed in depth with the patient the nature of atherosclerosis, and emphasized the importance of maximal medical management including strict control of blood pressure, blood glucose, and lipid levels, obtaining regular exercise, and continued cessation of smoking.  The patient is aware that without maximal medical management the underlying atherosclerotic disease process will progress, limiting the benefit of any interventions. The patient was given information about stroke prevention and what symptoms should prompt the patient to seek immediate medical care. Thank you for allowing [place] to participate in this patient's care.  Clemon Chambers, RN, MSN, FNP-C [facility] Office: [facility] Physician: Trula Slade  01/10/18 4:29 PM

## 2018-02-10 DIAGNOSIS — E039 Hypothyroidism, unspecified: Secondary | ICD-10-CM | POA: Diagnosis not present

## 2018-02-10 DIAGNOSIS — E1122 Type 2 diabetes mellitus with diabetic chronic kidney disease: Secondary | ICD-10-CM | POA: Diagnosis not present

## 2018-02-10 DIAGNOSIS — E782 Mixed hyperlipidemia: Secondary | ICD-10-CM | POA: Diagnosis not present

## 2018-02-17 DIAGNOSIS — G4700 Insomnia, unspecified: Secondary | ICD-10-CM | POA: Diagnosis not present

## 2018-02-17 DIAGNOSIS — R2689 Other abnormalities of gait and mobility: Secondary | ICD-10-CM | POA: Diagnosis not present

## 2018-02-17 DIAGNOSIS — E119 Type 2 diabetes mellitus without complications: Secondary | ICD-10-CM | POA: Diagnosis not present

## 2018-02-17 DIAGNOSIS — R809 Proteinuria, unspecified: Secondary | ICD-10-CM | POA: Diagnosis not present

## 2018-02-17 DIAGNOSIS — E230 Hypopituitarism: Secondary | ICD-10-CM | POA: Diagnosis not present

## 2018-02-17 DIAGNOSIS — E039 Hypothyroidism, unspecified: Secondary | ICD-10-CM | POA: Diagnosis not present

## 2018-02-17 DIAGNOSIS — F0390 Unspecified dementia without behavioral disturbance: Secondary | ICD-10-CM | POA: Diagnosis not present

## 2018-02-17 DIAGNOSIS — I10 Essential (primary) hypertension: Secondary | ICD-10-CM | POA: Diagnosis not present

## 2018-02-17 DIAGNOSIS — M25569 Pain in unspecified knee: Secondary | ICD-10-CM | POA: Diagnosis not present

## 2018-02-17 DIAGNOSIS — R944 Abnormal results of kidney function studies: Secondary | ICD-10-CM | POA: Diagnosis not present

## 2018-02-17 DIAGNOSIS — E441 Mild protein-calorie malnutrition: Secondary | ICD-10-CM | POA: Diagnosis not present

## 2018-02-17 DIAGNOSIS — Z6828 Body mass index (BMI) 28.0-28.9, adult: Secondary | ICD-10-CM | POA: Diagnosis not present

## 2018-04-22 ENCOUNTER — Emergency Department (HOSPITAL_COMMUNITY): Payer: Medicare Other

## 2018-04-22 ENCOUNTER — Other Ambulatory Visit: Payer: Self-pay

## 2018-04-22 ENCOUNTER — Inpatient Hospital Stay (HOSPITAL_COMMUNITY)
Admission: EM | Admit: 2018-04-22 | Discharge: 2018-04-26 | DRG: 469 | Disposition: A | Discharge status: Skilled Nursing Facility | Payer: Medicare Other | Attending: Internal Medicine | Admitting: Internal Medicine

## 2018-04-22 ENCOUNTER — Encounter (HOSPITAL_COMMUNITY): Payer: Self-pay | Admitting: Emergency Medicine

## 2018-04-22 DIAGNOSIS — E119 Type 2 diabetes mellitus without complications: Secondary | ICD-10-CM

## 2018-04-22 DIAGNOSIS — M25552 Pain in left hip: Secondary | ICD-10-CM | POA: Diagnosis not present

## 2018-04-22 DIAGNOSIS — S72002A Fracture of unspecified part of neck of left femur, initial encounter for closed fracture: Secondary | ICD-10-CM | POA: Diagnosis not present

## 2018-04-22 DIAGNOSIS — N3000 Acute cystitis without hematuria: Secondary | ICD-10-CM

## 2018-04-22 DIAGNOSIS — N179 Acute kidney failure, unspecified: Secondary | ICD-10-CM | POA: Diagnosis present

## 2018-04-22 DIAGNOSIS — Z09 Encounter for follow-up examination after completed treatment for conditions other than malignant neoplasm: Secondary | ICD-10-CM

## 2018-04-22 DIAGNOSIS — W1839XA Other fall on same level, initial encounter: Secondary | ICD-10-CM | POA: Diagnosis present

## 2018-04-22 DIAGNOSIS — A0472 Enterocolitis due to Clostridium difficile, not specified as recurrent: Secondary | ICD-10-CM | POA: Diagnosis not present

## 2018-04-22 DIAGNOSIS — F0390 Unspecified dementia without behavioral disturbance: Secondary | ICD-10-CM | POA: Diagnosis not present

## 2018-04-22 DIAGNOSIS — I129 Hypertensive chronic kidney disease with stage 1 through stage 4 chronic kidney disease, or unspecified chronic kidney disease: Secondary | ICD-10-CM | POA: Diagnosis present

## 2018-04-22 DIAGNOSIS — D62 Acute posthemorrhagic anemia: Secondary | ICD-10-CM | POA: Diagnosis not present

## 2018-04-22 DIAGNOSIS — E785 Hyperlipidemia, unspecified: Secondary | ICD-10-CM | POA: Diagnosis present

## 2018-04-22 DIAGNOSIS — N183 Chronic kidney disease, stage 3 unspecified: Secondary | ICD-10-CM | POA: Diagnosis present

## 2018-04-22 DIAGNOSIS — Z87891 Personal history of nicotine dependence: Secondary | ICD-10-CM

## 2018-04-22 DIAGNOSIS — E1122 Type 2 diabetes mellitus with diabetic chronic kidney disease: Secondary | ICD-10-CM | POA: Diagnosis present

## 2018-04-22 DIAGNOSIS — N289 Disorder of kidney and ureter, unspecified: Secondary | ICD-10-CM | POA: Diagnosis not present

## 2018-04-22 DIAGNOSIS — Z8673 Personal history of transient ischemic attack (TIA), and cerebral infarction without residual deficits: Secondary | ICD-10-CM

## 2018-04-22 DIAGNOSIS — S72009A Fracture of unspecified part of neck of unspecified femur, initial encounter for closed fracture: Secondary | ICD-10-CM | POA: Diagnosis present

## 2018-04-22 DIAGNOSIS — E039 Hypothyroidism, unspecified: Secondary | ICD-10-CM

## 2018-04-22 DIAGNOSIS — S20212A Contusion of left front wall of thorax, initial encounter: Secondary | ICD-10-CM | POA: Diagnosis not present

## 2018-04-22 DIAGNOSIS — G92 Toxic encephalopathy: Secondary | ICD-10-CM | POA: Diagnosis not present

## 2018-04-22 DIAGNOSIS — Z96649 Presence of unspecified artificial hip joint: Secondary | ICD-10-CM

## 2018-04-22 DIAGNOSIS — Y92009 Unspecified place in unspecified non-institutional (private) residence as the place of occurrence of the external cause: Secondary | ICD-10-CM

## 2018-04-22 DIAGNOSIS — K219 Gastro-esophageal reflux disease without esophagitis: Secondary | ICD-10-CM | POA: Diagnosis present

## 2018-04-22 DIAGNOSIS — S72032A Displaced midcervical fracture of left femur, initial encounter for closed fracture: Principal | ICD-10-CM | POA: Diagnosis present

## 2018-04-22 DIAGNOSIS — S299XXA Unspecified injury of thorax, initial encounter: Secondary | ICD-10-CM | POA: Diagnosis not present

## 2018-04-22 DIAGNOSIS — E1151 Type 2 diabetes mellitus with diabetic peripheral angiopathy without gangrene: Secondary | ICD-10-CM | POA: Diagnosis present

## 2018-04-22 DIAGNOSIS — I10 Essential (primary) hypertension: Secondary | ICD-10-CM | POA: Diagnosis present

## 2018-04-22 DIAGNOSIS — S7292XA Unspecified fracture of left femur, initial encounter for closed fracture: Secondary | ICD-10-CM

## 2018-04-22 DIAGNOSIS — S72012A Unspecified intracapsular fracture of left femur, initial encounter for closed fracture: Secondary | ICD-10-CM | POA: Diagnosis not present

## 2018-04-22 DIAGNOSIS — Z79890 Hormone replacement therapy (postmenopausal): Secondary | ICD-10-CM

## 2018-04-22 DIAGNOSIS — Z90710 Acquired absence of both cervix and uterus: Secondary | ICD-10-CM

## 2018-04-22 DIAGNOSIS — E230 Hypopituitarism: Secondary | ICD-10-CM | POA: Diagnosis present

## 2018-04-22 DIAGNOSIS — Z981 Arthrodesis status: Secondary | ICD-10-CM

## 2018-04-22 LAB — COMPREHENSIVE METABOLIC PANEL
ALK PHOS: 55 U/L (ref 38–126)
ALT: 13 U/L (ref 0–44)
AST: 19 U/L (ref 15–41)
Albumin: 3.6 g/dL (ref 3.5–5.0)
Anion gap: 9 (ref 5–15)
BILIRUBIN TOTAL: 0.6 mg/dL (ref 0.3–1.2)
BUN: 45 mg/dL — ABNORMAL HIGH (ref 8–23)
CALCIUM: 8.7 mg/dL — AB (ref 8.9–10.3)
CO2: 24 mmol/L (ref 22–32)
CREATININE: 1.77 mg/dL — AB (ref 0.44–1.00)
Chloride: 103 mmol/L (ref 98–111)
GFR, EST AFRICAN AMERICAN: 30 mL/min — AB (ref >60)
GFR, EST NON AFRICAN AMERICAN: 26 mL/min — AB (ref >60)
Glucose, Bld: 139 mg/dL — ABNORMAL HIGH (ref 70–99)
Potassium: 4.7 mmol/L (ref 3.5–5.1)
SODIUM: 136 mmol/L (ref 135–145)
TOTAL PROTEIN: 6.8 g/dL (ref 6.5–8.1)

## 2018-04-22 LAB — CBC WITH DIFFERENTIAL/PLATELET
BASOS ABS: 0 10*3/uL (ref 0.0–0.1)
BASOS PCT: 0 %
EOS ABS: 0.1 10*3/uL (ref 0.0–0.7)
Eosinophils Relative: 1 %
HEMATOCRIT: 40.6 % (ref 36.0–46.0)
Hemoglobin: 12.7 g/dL (ref 12.0–15.0)
Lymphocytes Relative: 16 %
Lymphs Abs: 1.6 10*3/uL (ref 0.7–4.0)
MCH: 30.9 pg (ref 26.0–34.0)
MCHC: 31.3 g/dL (ref 30.0–36.0)
MCV: 98.8 fL (ref 78.0–100.0)
Monocytes Absolute: 0.8 10*3/uL (ref 0.1–1.0)
Monocytes Relative: 8 %
Neutro Abs: 7.3 10*3/uL (ref 1.7–7.7)
Neutrophils Relative %: 75 %
Platelets: 178 10*3/uL (ref 150–400)
RBC: 4.11 MIL/uL (ref 3.87–5.11)
RDW: 14.3 % (ref 11.5–15.5)
WBC: 9.8 10*3/uL (ref 4.0–10.5)

## 2018-04-22 LAB — PROTIME-INR
INR: 0.93
PROTHROMBIN TIME: 12.4 s (ref 11.4–15.2)

## 2018-04-22 LAB — SAMPLE TO BLOOD BANK

## 2018-04-22 LAB — APTT: aPTT: 28 seconds (ref 24–36)

## 2018-04-22 MED ORDER — FENTANYL CITRATE (PF) 100 MCG/2ML IJ SOLN
50.0000 ug | Freq: Once | INTRAMUSCULAR | Status: AC
  Administered 2018-04-22: 50 ug via INTRAVENOUS
  Filled 2018-04-22: qty 2

## 2018-04-22 NOTE — ED Notes (Signed)
Pt O2 sats 89%-91%, 2L [place] started at this time, pt off unit to [facility]

## 2018-04-22 NOTE — ED Notes (Signed)
EKG done and seen by Dr Tomi Bamberger

## 2018-04-22 NOTE — ED Triage Notes (Signed)
Husband brought pt in due to fall. Pt a/o. Pt c/o left hip pain. Left foot shortened. Pt also c/o pain to left lateral ribs and left knee.

## 2018-04-23 ENCOUNTER — Inpatient Hospital Stay (HOSPITAL_COMMUNITY): Payer: Medicare Other

## 2018-04-23 ENCOUNTER — Inpatient Hospital Stay (HOSPITAL_COMMUNITY): Payer: Medicare Other | Admitting: Certified Registered"

## 2018-04-23 ENCOUNTER — Encounter (HOSPITAL_COMMUNITY)
Admission: EM | Disposition: A | Discharge status: Skilled Nursing Facility | Payer: Self-pay | Source: Home / Self Care | Attending: Internal Medicine

## 2018-04-23 DIAGNOSIS — Z96642 Presence of left artificial hip joint: Secondary | ICD-10-CM | POA: Diagnosis not present

## 2018-04-23 DIAGNOSIS — E119 Type 2 diabetes mellitus without complications: Secondary | ICD-10-CM | POA: Diagnosis not present

## 2018-04-23 DIAGNOSIS — N3000 Acute cystitis without hematuria: Secondary | ICD-10-CM | POA: Diagnosis not present

## 2018-04-23 DIAGNOSIS — S72009A Fracture of unspecified part of neck of unspecified femur, initial encounter for closed fracture: Secondary | ICD-10-CM | POA: Diagnosis present

## 2018-04-23 DIAGNOSIS — Z90710 Acquired absence of both cervix and uterus: Secondary | ICD-10-CM | POA: Diagnosis not present

## 2018-04-23 DIAGNOSIS — S72032A Displaced midcervical fracture of left femur, initial encounter for closed fracture: Secondary | ICD-10-CM | POA: Diagnosis present

## 2018-04-23 DIAGNOSIS — G92 Toxic encephalopathy: Secondary | ICD-10-CM | POA: Diagnosis not present

## 2018-04-23 DIAGNOSIS — Z8673 Personal history of transient ischemic attack (TIA), and cerebral infarction without residual deficits: Secondary | ICD-10-CM | POA: Diagnosis not present

## 2018-04-23 DIAGNOSIS — D62 Acute posthemorrhagic anemia: Secondary | ICD-10-CM | POA: Diagnosis not present

## 2018-04-23 DIAGNOSIS — Z471 Aftercare following joint replacement surgery: Secondary | ICD-10-CM | POA: Diagnosis not present

## 2018-04-23 DIAGNOSIS — S7292XA Unspecified fracture of left femur, initial encounter for closed fracture: Secondary | ICD-10-CM

## 2018-04-23 DIAGNOSIS — N179 Acute kidney failure, unspecified: Secondary | ICD-10-CM | POA: Diagnosis present

## 2018-04-23 DIAGNOSIS — N183 Chronic kidney disease, stage 3 unspecified: Secondary | ICD-10-CM

## 2018-04-23 DIAGNOSIS — I129 Hypertensive chronic kidney disease with stage 1 through stage 4 chronic kidney disease, or unspecified chronic kidney disease: Secondary | ICD-10-CM | POA: Diagnosis present

## 2018-04-23 DIAGNOSIS — R4020 Unspecified coma: Secondary | ICD-10-CM | POA: Diagnosis not present

## 2018-04-23 DIAGNOSIS — Z79890 Hormone replacement therapy (postmenopausal): Secondary | ICD-10-CM | POA: Diagnosis not present

## 2018-04-23 DIAGNOSIS — S3289XA Fracture of other parts of pelvis, initial encounter for closed fracture: Secondary | ICD-10-CM | POA: Diagnosis not present

## 2018-04-23 DIAGNOSIS — E1122 Type 2 diabetes mellitus with diabetic chronic kidney disease: Secondary | ICD-10-CM | POA: Diagnosis present

## 2018-04-23 DIAGNOSIS — S20212A Contusion of left front wall of thorax, initial encounter: Secondary | ICD-10-CM | POA: Diagnosis present

## 2018-04-23 DIAGNOSIS — I10 Essential (primary) hypertension: Secondary | ICD-10-CM | POA: Diagnosis not present

## 2018-04-23 DIAGNOSIS — A0472 Enterocolitis due to Clostridium difficile, not specified as recurrent: Secondary | ICD-10-CM | POA: Diagnosis present

## 2018-04-23 DIAGNOSIS — M25552 Pain in left hip: Secondary | ICD-10-CM | POA: Diagnosis not present

## 2018-04-23 DIAGNOSIS — S72012A Unspecified intracapsular fracture of left femur, initial encounter for closed fracture: Secondary | ICD-10-CM | POA: Diagnosis not present

## 2018-04-23 DIAGNOSIS — N170 Acute kidney failure with tubular necrosis: Secondary | ICD-10-CM | POA: Diagnosis not present

## 2018-04-23 DIAGNOSIS — E785 Hyperlipidemia, unspecified: Secondary | ICD-10-CM | POA: Diagnosis present

## 2018-04-23 DIAGNOSIS — R262 Difficulty in walking, not elsewhere classified: Secondary | ICD-10-CM | POA: Diagnosis not present

## 2018-04-23 DIAGNOSIS — K219 Gastro-esophageal reflux disease without esophagitis: Secondary | ICD-10-CM | POA: Diagnosis not present

## 2018-04-23 DIAGNOSIS — S72002A Fracture of unspecified part of neck of left femur, initial encounter for closed fracture: Secondary | ICD-10-CM | POA: Diagnosis not present

## 2018-04-23 DIAGNOSIS — R4182 Altered mental status, unspecified: Secondary | ICD-10-CM | POA: Diagnosis not present

## 2018-04-23 DIAGNOSIS — Z981 Arthrodesis status: Secondary | ICD-10-CM | POA: Diagnosis not present

## 2018-04-23 DIAGNOSIS — E039 Hypothyroidism, unspecified: Secondary | ICD-10-CM

## 2018-04-23 DIAGNOSIS — S299XXA Unspecified injury of thorax, initial encounter: Secondary | ICD-10-CM | POA: Diagnosis not present

## 2018-04-23 DIAGNOSIS — R279 Unspecified lack of coordination: Secondary | ICD-10-CM | POA: Diagnosis not present

## 2018-04-23 DIAGNOSIS — W1839XA Other fall on same level, initial encounter: Secondary | ICD-10-CM | POA: Diagnosis not present

## 2018-04-23 DIAGNOSIS — E038 Other specified hypothyroidism: Secondary | ICD-10-CM | POA: Diagnosis not present

## 2018-04-23 DIAGNOSIS — Y92009 Unspecified place in unspecified non-institutional (private) residence as the place of occurrence of the external cause: Secondary | ICD-10-CM | POA: Diagnosis not present

## 2018-04-23 DIAGNOSIS — Z87891 Personal history of nicotine dependence: Secondary | ICD-10-CM | POA: Diagnosis not present

## 2018-04-23 DIAGNOSIS — E1151 Type 2 diabetes mellitus with diabetic peripheral angiopathy without gangrene: Secondary | ICD-10-CM | POA: Diagnosis not present

## 2018-04-23 DIAGNOSIS — Z743 Need for continuous supervision: Secondary | ICD-10-CM | POA: Diagnosis not present

## 2018-04-23 DIAGNOSIS — M6281 Muscle weakness (generalized): Secondary | ICD-10-CM | POA: Diagnosis not present

## 2018-04-23 DIAGNOSIS — F0390 Unspecified dementia without behavioral disturbance: Secondary | ICD-10-CM | POA: Diagnosis present

## 2018-04-23 DIAGNOSIS — M96662 Fracture of femur following insertion of orthopedic implant, joint prosthesis, or bone plate, left leg: Secondary | ICD-10-CM | POA: Diagnosis not present

## 2018-04-23 DIAGNOSIS — N990 Postprocedural (acute) (chronic) kidney failure: Secondary | ICD-10-CM | POA: Diagnosis not present

## 2018-04-23 HISTORY — PX: TOTAL HIP ARTHROPLASTY: SHX124

## 2018-04-23 LAB — GLUCOSE, CAPILLARY: Glucose-Capillary: 119 mg/dL — ABNORMAL HIGH (ref 70–99)

## 2018-04-23 LAB — URINALYSIS, ROUTINE W REFLEX MICROSCOPIC
BILIRUBIN URINE: NEGATIVE
GLUCOSE, UA: NEGATIVE mg/dL
HGB URINE DIPSTICK: NEGATIVE
Ketones, ur: NEGATIVE mg/dL
NITRITE: POSITIVE — AB
PH: 6 (ref 5.0–8.0)
Protein, ur: 30 mg/dL — AB
SPECIFIC GRAVITY, URINE: 1.013 (ref 1.005–1.030)

## 2018-04-23 LAB — SURGICAL PCR SCREEN
MRSA, PCR: NEGATIVE
STAPHYLOCOCCUS AUREUS: POSITIVE — AB

## 2018-04-23 SURGERY — ARTHROPLASTY, HIP, TOTAL, ANTERIOR APPROACH
Anesthesia: General | Site: Hip | Laterality: Left

## 2018-04-23 MED ORDER — HEPARIN SODIUM (PORCINE) 5000 UNIT/ML IJ SOLN: 5000.0000 [IU] | Freq: Three times a day (TID) | INTRAMUSCULAR | Status: DC

## 2018-04-23 MED ORDER — MEMANTINE HCL 5 MG PO TABS
10.0000 mg | ORAL_TABLET | Freq: Two times a day (BID) | ORAL | Status: DC
  Administered 2018-04-23 – 2018-04-26 (×7): 10 mg via ORAL
  Filled 2018-04-23: qty 2
  Filled 2018-04-23: qty 1
  Filled 2018-04-23: qty 2
  Filled 2018-04-23: qty 1
  Filled 2018-04-23 (×2): qty 2
  Filled 2018-04-23: qty 1
  Filled 2018-04-23: qty 2

## 2018-04-23 MED ORDER — HYDROCORTISONE 10 MG PO TABS
10.0000 mg | ORAL_TABLET | Freq: Every day | ORAL | Status: DC
  Administered 2018-04-23 – 2018-04-26 (×4): 10 mg via ORAL
  Filled 2018-04-23 (×5): qty 1

## 2018-04-23 MED ORDER — LIDOCAINE 2% (20 MG/ML) 5 ML SYRINGE
INTRAMUSCULAR | Status: AC
  Filled 2018-04-23: qty 5

## 2018-04-23 MED ORDER — CEFAZOLIN SODIUM-DEXTROSE 2-4 GM/100ML-% IV SOLN
2.0000 g | INTRAVENOUS | Status: AC
  Administered 2018-04-23: 2 g via INTRAVENOUS

## 2018-04-23 MED ORDER — CEFAZOLIN SODIUM-DEXTROSE 2-4 GM/100ML-% IV SOLN
INTRAVENOUS | Status: AC
  Filled 2018-04-23: qty 100

## 2018-04-23 MED ORDER — PROPOFOL 10 MG/ML IV BOLUS
INTRAVENOUS | Status: DC | PRN
  Administered 2018-04-23: 80 mg via INTRAVENOUS
  Administered 2018-04-23: 40 mg via INTRAVENOUS

## 2018-04-23 MED ORDER — AMITRIPTYLINE HCL 25 MG PO TABS
25.0000 mg | ORAL_TABLET | Freq: Every day | ORAL | Status: DC
  Administered 2018-04-23 – 2018-04-24 (×2): 25 mg via ORAL
  Filled 2018-04-23 (×4): qty 1

## 2018-04-23 MED ORDER — ONDANSETRON HCL 4 MG/2ML IJ SOLN
INTRAMUSCULAR | Status: AC
  Filled 2018-04-23: qty 2

## 2018-04-23 MED ORDER — LACTATED RINGERS IV SOLN
INTRAVENOUS | Status: DC
  Administered 2018-04-23 (×2): via INTRAVENOUS

## 2018-04-23 MED ORDER — METHOCARBAMOL 1000 MG/10ML IJ SOLN
500.0000 mg | Freq: Four times a day (QID) | INTRAVENOUS | Status: DC | PRN
  Administered 2018-04-26: 500 mg via INTRAVENOUS
  Filled 2018-04-23 (×2): qty 5

## 2018-04-23 MED ORDER — MORPHINE SULFATE (PF) 2 MG/ML IV SOLN
1.0000 mg | INTRAVENOUS | Status: DC | PRN
  Administered 2018-04-25: 1 mg via INTRAVENOUS
  Filled 2018-04-23: qty 1

## 2018-04-23 MED ORDER — LEVOTHYROXINE SODIUM 75 MCG PO TABS
75.0000 ug | ORAL_TABLET | Freq: Every day | ORAL | Status: DC
  Administered 2018-04-23 – 2018-04-26 (×4): 75 ug via ORAL
  Filled 2018-04-23 (×3): qty 1
  Filled 2018-04-23: qty 2

## 2018-04-23 MED ORDER — PHENYLEPHRINE HCL 10 MG/ML IJ SOLN
INTRAMUSCULAR | Status: DC | PRN
  Administered 2018-04-23 (×3): 100 ug via INTRAVENOUS

## 2018-04-23 MED ORDER — VASOPRESSIN 20 UNIT/ML IV SOLN
INTRAVENOUS | Status: AC
  Filled 2018-04-23: qty 1

## 2018-04-23 MED ORDER — ROSUVASTATIN CALCIUM 5 MG PO TABS
5.0000 mg | ORAL_TABLET | Freq: Every day | ORAL | Status: DC
  Administered 2018-04-23 – 2018-04-25 (×3): 5 mg via ORAL
  Filled 2018-04-23 (×3): qty 1

## 2018-04-23 MED ORDER — LACTATED RINGERS IV SOLN
INTRAVENOUS | Status: AC
  Administered 2018-04-23 (×2): via INTRAVENOUS

## 2018-04-23 MED ORDER — TRANEXAMIC ACID 1000 MG/10ML IV SOLN
INTRAVENOUS | Status: DC | PRN
  Administered 2018-04-23: 2000 mg via TOPICAL

## 2018-04-23 MED ORDER — DOCUSATE SODIUM 100 MG PO CAPS
100.0000 mg | ORAL_CAPSULE | Freq: Two times a day (BID) | ORAL | Status: DC
  Administered 2018-04-23 – 2018-04-25 (×4): 100 mg via ORAL
  Filled 2018-04-23 (×4): qty 1

## 2018-04-23 MED ORDER — ASPIRIN-DIPYRIDAMOLE ER 25-200 MG PO CP12
1.0000 | ORAL_CAPSULE | Freq: Two times a day (BID) | ORAL | Status: DC
  Administered 2018-04-24 – 2018-04-26 (×5): 1 via ORAL
  Filled 2018-04-23 (×7): qty 1

## 2018-04-23 MED ORDER — ACETAMINOPHEN 500 MG PO TABS
500.0000 mg | ORAL_TABLET | Freq: Four times a day (QID) | ORAL | Status: AC
  Administered 2018-04-24 (×3): 500 mg via ORAL
  Filled 2018-04-23 (×4): qty 1

## 2018-04-23 MED ORDER — HYDROCODONE-ACETAMINOPHEN 7.5-325 MG PO TABS
1.0000 | ORAL_TABLET | ORAL | Status: DC | PRN
  Administered 2018-04-23 (×2): 1 via ORAL
  Filled 2018-04-23 (×2): qty 1

## 2018-04-23 MED ORDER — PHENOL 1.4 % MT LIQD: 1.0000 | OROMUCOSAL | Status: DC | PRN

## 2018-04-23 MED ORDER — ONDANSETRON HCL 4 MG/2ML IJ SOLN
4.0000 mg | Freq: Four times a day (QID) | INTRAMUSCULAR | Status: DC | PRN
  Administered 2018-04-23: 4 mg via INTRAVENOUS

## 2018-04-23 MED ORDER — TRANEXAMIC ACID 1000 MG/10ML IV SOLN
2000.0000 mg | Freq: Once | INTRAVENOUS | Status: DC
  Filled 2018-04-23: qty 20

## 2018-04-23 MED ORDER — FENTANYL CITRATE (PF) 250 MCG/5ML IJ SOLN
INTRAMUSCULAR | Status: AC
  Filled 2018-04-23: qty 5

## 2018-04-23 MED ORDER — ALUM & MAG HYDROXIDE-SIMETH 200-200-20 MG/5ML PO SUSP: 30.0000 mL | ORAL | Status: DC | PRN

## 2018-04-23 MED ORDER — VANCOMYCIN HCL 1000 MG IV SOLR
INTRAVENOUS | Status: DC | PRN
  Administered 2018-04-23: 1000 mg via TOPICAL

## 2018-04-23 MED ORDER — SODIUM CHLORIDE 0.9 % IV SOLN
INTRAVENOUS | Status: DC
  Administered 2018-04-23 – 2018-04-25 (×5): via INTRAVENOUS

## 2018-04-23 MED ORDER — HYDROCORTISONE 20 MG PO TABS
20.0000 mg | ORAL_TABLET | Freq: Every day | ORAL | Status: DC
  Administered 2018-04-23 – 2018-04-26 (×4): 20 mg via ORAL
  Filled 2018-04-23 (×4): qty 1

## 2018-04-23 MED ORDER — EPHEDRINE SULFATE 50 MG/ML IJ SOLN
INTRAMUSCULAR | Status: DC | PRN
  Administered 2018-04-23: 5 mg via INTRAVENOUS
  Administered 2018-04-23: 10 mg via INTRAVENOUS

## 2018-04-23 MED ORDER — FENTANYL CITRATE (PF) 100 MCG/2ML IJ SOLN
50.0000 ug | Freq: Once | INTRAMUSCULAR | Status: AC
  Administered 2018-04-23: 50 ug via INTRAVENOUS
  Filled 2018-04-23: qty 2

## 2018-04-23 MED ORDER — ATENOLOL 100 MG PO TABS
100.0000 mg | ORAL_TABLET | Freq: Every day | ORAL | Status: DC
  Administered 2018-04-23 – 2018-04-26 (×3): 100 mg via ORAL
  Filled 2018-04-23: qty 1
  Filled 2018-04-23: qty 2
  Filled 2018-04-23: qty 1
  Filled 2018-04-23: qty 2

## 2018-04-23 MED ORDER — BACITRACIN ZINC 500 UNIT/GM EX OINT
TOPICAL_OINTMENT | CUTANEOUS | Status: AC
  Filled 2018-04-23: qty 28.35

## 2018-04-23 MED ORDER — SORBITOL 70 % SOLN: 30.0000 mL | Freq: Every day | Status: DC | PRN

## 2018-04-23 MED ORDER — HYDROCODONE-ACETAMINOPHEN 5-325 MG PO TABS
1.0000 | ORAL_TABLET | ORAL | Status: DC | PRN
  Administered 2018-04-25: 2 via ORAL
  Filled 2018-04-23: qty 1
  Filled 2018-04-23: qty 2

## 2018-04-23 MED ORDER — VANCOMYCIN HCL 1000 MG IV SOLR
INTRAVENOUS | Status: AC
Start: 2018-04-23 — End: ?
  Filled 2018-04-23: qty 1000

## 2018-04-23 MED ORDER — ONDANSETRON HCL 4 MG PO TABS: 4.0000 mg | ORAL_TABLET | Freq: Four times a day (QID) | ORAL | Status: DC | PRN

## 2018-04-23 MED ORDER — PROPOFOL 10 MG/ML IV BOLUS
INTRAVENOUS | Status: AC
  Filled 2018-04-23: qty 20

## 2018-04-23 MED ORDER — ONDANSETRON HCL 4 MG/2ML IJ SOLN: 4.0000 mg | Freq: Four times a day (QID) | INTRAMUSCULAR | Status: DC | PRN

## 2018-04-23 MED ORDER — NEOSTIGMINE METHYLSULFATE 10 MG/10ML IV SOLN
INTRAVENOUS | Status: DC | PRN
  Administered 2018-04-23: 2.5 mg via INTRAVENOUS

## 2018-04-23 MED ORDER — DILTIAZEM HCL ER COATED BEADS 240 MG PO CP24
240.0000 mg | ORAL_CAPSULE | Freq: Every day | ORAL | Status: DC
  Administered 2018-04-23 – 2018-04-26 (×3): 240 mg via ORAL
  Filled 2018-04-23 (×4): qty 1

## 2018-04-23 MED ORDER — SUGAMMADEX SODIUM 200 MG/2ML IV SOLN
INTRAVENOUS | Status: AC
  Filled 2018-04-23: qty 2

## 2018-04-23 MED ORDER — HYDROCORTISONE 10 MG PO TABS: 10.0000 mg | ORAL_TABLET | Freq: Every day | ORAL | Status: DC

## 2018-04-23 MED ORDER — ACETAMINOPHEN 325 MG PO TABS
650.0000 mg | ORAL_TABLET | Freq: Four times a day (QID) | ORAL | Status: DC | PRN
  Administered 2018-04-24 – 2018-04-25 (×2): 650 mg via ORAL
  Filled 2018-04-23 (×2): qty 2

## 2018-04-23 MED ORDER — LIDOCAINE 2% (20 MG/ML) 5 ML SYRINGE
INTRAMUSCULAR | Status: DC | PRN
  Administered 2018-04-23: 100 mg via INTRAVENOUS

## 2018-04-23 MED ORDER — GLYCOPYRROLATE 0.2 MG/ML IJ SOLN
INTRAMUSCULAR | Status: DC | PRN
  Administered 2018-04-23: 0.3 mg via INTRAVENOUS
  Administered 2018-04-23: 0.2 mg via INTRAVENOUS

## 2018-04-23 MED ORDER — MAGNESIUM CITRATE PO SOLN: 1.0000 | Freq: Once | ORAL | Status: DC | PRN

## 2018-04-23 MED ORDER — MENTHOL 3 MG MT LOZG: 1.0000 | LOZENGE | OROMUCOSAL | Status: DC | PRN

## 2018-04-23 MED ORDER — PANTOPRAZOLE SODIUM 40 MG PO TBEC
40.0000 mg | DELAYED_RELEASE_TABLET | Freq: Every day | ORAL | Status: DC
  Administered 2018-04-23 – 2018-04-26 (×4): 40 mg via ORAL
  Filled 2018-04-23 (×4): qty 1

## 2018-04-23 MED ORDER — METHOCARBAMOL 500 MG PO TABS
500.0000 mg | ORAL_TABLET | Freq: Four times a day (QID) | ORAL | Status: DC | PRN
  Filled 2018-04-23: qty 1

## 2018-04-23 MED ORDER — POLYETHYLENE GLYCOL 3350 17 G PO PACK: 17.0000 g | PACK | Freq: Every day | ORAL | Status: DC | PRN

## 2018-04-23 MED ORDER — TRANEXAMIC ACID 1000 MG/10ML IV SOLN
1000.0000 mg | INTRAVENOUS | Status: AC
  Administered 2018-04-23: 1000 mg via INTRAVENOUS
  Filled 2018-04-23: qty 10

## 2018-04-23 MED ORDER — FENTANYL CITRATE (PF) 100 MCG/2ML IJ SOLN
25.0000 ug | INTRAMUSCULAR | Status: DC | PRN
  Administered 2018-04-23: 100 ug via INTRAVENOUS
  Administered 2018-04-23: 25 ug via INTRAVENOUS
  Filled 2018-04-23: qty 2

## 2018-04-23 MED ORDER — VITAMIN D 1000 UNITS PO TABS
3000.0000 [IU] | ORAL_TABLET | Freq: Every day | ORAL | Status: DC
  Administered 2018-04-23 – 2018-04-26 (×4): 3000 [IU] via ORAL
  Filled 2018-04-23 (×4): qty 3

## 2018-04-23 MED ORDER — OXYCODONE HCL 5 MG PO TABS: 5.0000 mg | ORAL_TABLET | ORAL | 0 refills | Status: DC | PRN

## 2018-04-23 MED ORDER — SODIUM CHLORIDE 0.9 % IV SOLN
1.0000 g | Freq: Once | INTRAVENOUS | Status: AC
  Administered 2018-04-23: 1 g via INTRAVENOUS
  Filled 2018-04-23 (×2): qty 1

## 2018-04-23 MED ORDER — ACETAMINOPHEN 650 MG RE SUPP
650.0000 mg | Freq: Four times a day (QID) | RECTAL | Status: DC | PRN
Start: 2018-04-23 — End: 2018-04-26

## 2018-04-23 MED ORDER — POVIDONE-IODINE 10 % EX SWAB: 2.0000 "application " | Freq: Once | CUTANEOUS | Status: DC

## 2018-04-23 MED ORDER — SUGAMMADEX SODIUM 200 MG/2ML IV SOLN: INTRAVENOUS | Status: DC | PRN

## 2018-04-23 MED ORDER — FENTANYL CITRATE (PF) 100 MCG/2ML IJ SOLN
25.0000 ug | INTRAMUSCULAR | Status: DC | PRN
  Administered 2018-04-23: 25 ug via INTRAVENOUS

## 2018-04-23 MED ORDER — SODIUM CHLORIDE 0.9 % IR SOLN
Status: DC | PRN
  Administered 2018-04-23: 3000 mL

## 2018-04-23 MED ORDER — 0.9 % SODIUM CHLORIDE (POUR BTL) OPTIME
TOPICAL | Status: DC | PRN
  Administered 2018-04-23: 1000 mL

## 2018-04-23 MED ORDER — HYDRALAZINE HCL 20 MG/ML IJ SOLN
10.0000 mg | INTRAMUSCULAR | Status: DC | PRN
  Administered 2018-04-26: 10 mg via INTRAVENOUS
  Filled 2018-04-23: qty 1

## 2018-04-23 MED ORDER — CEFAZOLIN SODIUM-DEXTROSE 2-4 GM/100ML-% IV SOLN
2.0000 g | Freq: Four times a day (QID) | INTRAVENOUS | Status: AC
  Administered 2018-04-23 – 2018-04-24 (×3): 2 g via INTRAVENOUS
  Filled 2018-04-23 (×3): qty 100

## 2018-04-23 MED ORDER — ROCURONIUM BROMIDE 10 MG/ML (PF) SYRINGE
PREFILLED_SYRINGE | INTRAVENOUS | Status: AC
  Filled 2018-04-23: qty 10

## 2018-04-23 MED ORDER — PHENYLEPHRINE 40 MCG/ML (10ML) SYRINGE FOR IV PUSH (FOR BLOOD PRESSURE SUPPORT)
PREFILLED_SYRINGE | INTRAVENOUS | Status: AC
  Filled 2018-04-23: qty 10

## 2018-04-23 MED ORDER — FENTANYL CITRATE (PF) 100 MCG/2ML IJ SOLN
INTRAMUSCULAR | Status: AC
  Filled 2018-04-23: qty 2

## 2018-04-23 MED ORDER — ROCURONIUM BROMIDE 100 MG/10ML IV SOLN
INTRAVENOUS | Status: DC | PRN
  Administered 2018-04-23: 50 mg via INTRAVENOUS

## 2018-04-23 MED ORDER — ACETAMINOPHEN 325 MG PO TABS: 325.0000 mg | ORAL_TABLET | Freq: Four times a day (QID) | ORAL | Status: DC | PRN

## 2018-04-23 MED ORDER — SODIUM CHLORIDE 0.9 % IV SOLN
INTRAVENOUS | Status: DC | PRN
  Administered 2018-04-23: 25 ug/min via INTRAVENOUS

## 2018-04-23 SURGICAL SUPPLY — 43 items
BAG DECANTER FOR FLEXI CONT (MISCELLANEOUS) ×5 IMPLANT
CAPT HIP HEMI 2 ×2 IMPLANT
COVER SURGICAL LIGHT HANDLE (MISCELLANEOUS) ×3 IMPLANT
DRAPE C-ARM 42X72 X-RAY (DRAPES) ×3 IMPLANT
DRAPE POUCH INSTRU U-SHP 10X18 (DRAPES) ×2 IMPLANT
DRAPE STERI IOBAN 125X83 (DRAPES) ×3 IMPLANT
DRAPE U-SHAPE 47X51 STRL (DRAPES) ×4 IMPLANT
DRSG MEPILEX BORDER 4X8 (GAUZE/BANDAGES/DRESSINGS) ×2 IMPLANT
DURAPREP 26ML APPLICATOR (WOUND CARE) ×3 IMPLANT
ELECT BLADE 4.0 EZ CLEAN MEGAD (MISCELLANEOUS) ×3
ELECT REM PT RETURN 9FT ADLT (ELECTROSURGICAL) ×3
ELECTRODE BLDE 4.0 EZ CLN MEGD (MISCELLANEOUS) ×1 IMPLANT
ELECTRODE REM PT RTRN 9FT ADLT (ELECTROSURGICAL) ×1 IMPLANT
GAUZE XEROFORM 1X8 LF (GAUZE/BANDAGES/DRESSINGS) ×2 IMPLANT
GLOVE BIOGEL PI IND STRL 7.0 (GLOVE) ×1 IMPLANT
GLOVE BIOGEL PI INDICATOR 7.0 (GLOVE) ×2
GLOVE ECLIPSE 6.5 STRL STRAW (GLOVE) ×2 IMPLANT
GLOVE ECLIPSE 7.0 STRL STRAW (GLOVE) ×6 IMPLANT
GLOVE SURG SYN 7.5  E (GLOVE) ×8
GLOVE SURG SYN 7.5 E (GLOVE) ×4 IMPLANT
GLOVE SURG SYN 7.5 PF PI (GLOVE) ×4 IMPLANT
GOWN SRG XL XLNG 56XLVL 4 (GOWN DISPOSABLE) ×1 IMPLANT
GOWN STRL NON-REIN XL XLG LVL4 (GOWN DISPOSABLE) ×3
GOWN STRL REUS W/ TWL LRG LVL3 (GOWN DISPOSABLE) IMPLANT
GOWN STRL REUS W/TWL LRG LVL3 (GOWN DISPOSABLE) ×6
HANDPIECE INTERPULSE COAX TIP (DISPOSABLE) ×3
KIT BASIN OR (CUSTOM PROCEDURE TRAY) ×3 IMPLANT
MARKER SKIN DUAL TIP RULER LAB (MISCELLANEOUS) ×3 IMPLANT
NDL SPNL 18GX3.5 QUINCKE PK (NEEDLE) ×1 IMPLANT
NEEDLE SPNL 18GX3.5 QUINCKE PK (NEEDLE) ×3 IMPLANT
PACK TOTAL JOINT (CUSTOM PROCEDURE TRAY) ×3 IMPLANT
PACK UNIVERSAL I (CUSTOM PROCEDURE TRAY) ×3 IMPLANT
SAW OSC TIP CART 19.5X105X1.3 (SAW) ×3 IMPLANT
SET HNDPC FAN SPRY TIP SCT (DISPOSABLE) ×1 IMPLANT
SUT ETHILON 2 0 FS 18 (SUTURE) ×6 IMPLANT
SUT VIC AB 1 CT1 27 (SUTURE) ×3
SUT VIC AB 1 CT1 27XBRD ANBCTR (SUTURE) ×1 IMPLANT
SUT VIC AB 2-0 CT1 27 (SUTURE) ×9
SUT VIC AB 2-0 CT1 TAPERPNT 27 (SUTURE) ×2 IMPLANT
SYRINGE 60CC LL (MISCELLANEOUS) ×3 IMPLANT
TOWEL OR 17X26 10 PK STRL BLUE (TOWEL DISPOSABLE) ×3 IMPLANT
TRAY FOLEY W/BAG SLVR 14FR (SET/KITS/TRAYS/PACK) ×2 IMPLANT
YANKAUER SUCT BULB TIP NO VENT (SUCTIONS) ×3 IMPLANT

## 2018-04-23 NOTE — Plan of Care (Signed)

## 2018-04-23 NOTE — Anesthesia Procedure Notes (Signed)
Procedure Name: Intubation Date/Time: 04/23/2018 12:57 PM Performed by: Adalberto Ill, CRNA Pre-anesthesia Checklist: Patient identified, Emergency Drugs available, Suction available, Patient being monitored and Timeout performed Patient Re-evaluated:Patient Re-evaluated prior to induction Oxygen Delivery Method: Circle system utilized Preoxygenation: Pre-oxygenation with 100% oxygen Induction Type: IV induction Ventilation: Mask ventilation without difficulty Laryngoscope Size: Miller and 2 Grade View: Grade I Tube type: Oral Tube size: 7.5 mm Number of attempts: 1 (easily atraumatic dentition unchanged ) Airway Equipment and Method: Stylet Placement Confirmation: ETT inserted through vocal cords under direct vision,  positive ETCO2 and breath sounds checked- equal and bilateral Secured at: 22 cm Tube secured with: Tape Dental Injury: Teeth and Oropharynx as per pre-operative assessment

## 2018-04-23 NOTE — Anesthesia Preprocedure Evaluation (Signed)
Anesthesia Evaluation  Patient identified by MRN, date of birth, ID band Patient awake    Reviewed: Allergy & Precautions, NPO status , Patient's Chart, lab work & pertinent test results, reviewed documented beta blocker date and time   History of Anesthesia Complications Negative for: history of anesthetic complications  Airway Mallampati: III  TM Distance: >3 FB Neck ROM: Full    Dental  (+) Teeth Intact   Pulmonary neg shortness of breath, neg sleep apnea, neg COPD, neg recent URI, former smoker,    breath sounds clear to auscultation       Cardiovascular hypertension, Pt. on medications and Pt. on home beta blockers + Peripheral Vascular Disease  + Valvular Problems/Murmurs  Rhythm:Regular     Neuro/Psych CVA negative psych ROS   GI/Hepatic Neg liver ROS, GERD  Medicated and Controlled,  Endo/Other  diabetes, Type 2Hypothyroidism   Renal/GU CRFRenal disease     Musculoskeletal  (+) Arthritis , Left hip fx   Abdominal   Peds  Hematology  (+) anemia ,   Anesthesia Other Findings   Reproductive/Obstetrics                             Anesthesia Physical Anesthesia Plan  ASA: III  Anesthesia Plan: General   Post-op Pain Management:    Induction: Intravenous  PONV Risk Score and Plan: 3 and Ondansetron and Dexamethasone  Airway Management Planned: Oral ETT  Additional Equipment: None  Intra-op Plan:   Post-operative Plan: Extubation in OR  Informed Consent: I have reviewed the patients History and Physical, chart, labs and discussed the procedure including the risks, benefits and alternatives for the proposed anesthesia with the patient or authorized representative who has indicated his/her understanding and acceptance.   Dental advisory given  Plan Discussed with: CRNA and Surgeon  Anesthesia Plan Comments:         Anesthesia Quick Evaluation

## 2018-04-23 NOTE — Anesthesia Postprocedure Evaluation (Signed)
Anesthesia Post Note  Patient: Anihya T Lapinski  Procedure(s) Performed: ANTERIOR LEFT HIP HEMIARTHROPALSTY (Left Hip)     Patient location during evaluation: PACU Anesthesia Type: General Level of consciousness: awake and patient cooperative Pain management: pain level controlled Vital Signs Assessment: post-procedure vital signs reviewed and stable Respiratory status: spontaneous breathing, nonlabored ventilation, respiratory function stable and patient connected to nasal cannula oxygen Cardiovascular status: blood pressure returned to baseline and stable Postop Assessment: no apparent nausea or vomiting Anesthetic complications: no    Last Vitals:  Vitals:   04/23/18 1530 04/23/18 1545  BP: (!) 147/46 (!) 135/55  Pulse: (!) 58 (!) 55  Resp: 18 16  Temp:    SpO2: 100% 100%    Last Pain:  Vitals:   04/23/18 0900  TempSrc:   PainSc: 0-No pain                 Ranie Chinchilla

## 2018-04-23 NOTE — Transfer of Care (Signed)
Immediate Anesthesia Transfer of Care Note  Patient: Katie Pace  Procedure(s) Performed: ANTERIOR LEFT HIP HEMIARTHROPALSTY (Left Hip)  Patient Location: PACU  Anesthesia Type:General  Level of Consciousness: awake, alert  and patient cooperative  Airway & Oxygen Therapy: Patient Spontanous Breathing and Patient connected to face mask oxygen  Post-op Assessment: Report given to RN and Post -op Vital signs reviewed and stable  Post vital signs: Reviewed and stable  Last Vitals:  Vitals Value Taken Time  BP 132/45 04/23/2018  2:51 PM  Temp    Pulse 59 04/23/2018  2:54 PM  Resp 14 04/23/2018  2:54 PM  SpO2 100 % 04/23/2018  2:54 PM  Vitals shown include unvalidated device data.  Last Pain:  Vitals:   04/23/18 0900  TempSrc:   PainSc: 0-No pain         Complications: No apparent anesthesia complications

## 2018-04-23 NOTE — Progress Notes (Signed)
[facility]                                                                              Patient Demographics  Katie Pace, is a 82 y.o. female, DOB - Aug 06, 1936, [place] date - 04/22/2018   Admitting Physician Pratik Darleen Crocker, DO  Outpatient Primary MD for the patient is [name], MD  Outpatient specialists:   LOS - 0  days    Chief Complaint  Patient presents with  . Fall       Brief summary   Katie Pace is a 82 y.o. female  with medical history significant for but not limited to advanced dementia, diabetes and CKD presenting with left hip fracture  Secondary to mechanical fall after her walker was caught in a rug.  She was admitted and planned for left hip hemiarthroplasty by orthopedist Dr. Erlinda Hong   Assessment & Plan    Principal Problem:   Femur fracture, left [facility]) Active Problems:   Sheehan syndrome ([place])   Hypertension   Diabetes mellitus ([place])   Acute renal failure ([place])   CKD (chronic kidney disease) stage 3, GFR 30-59 ml/min (HCC)   Hypothyroidism   Hip fracture ([place])   1. Acute left hip fracture:   For left hemiarthroplasty 04/23/2018   supportive care   2. AKI on CKD stage III versus CKD progression:   Maintain on gentle IV fluid and avoid nephrotoxic agents.     Monitor renal function with electrolytes   3.Sheehan syndrome:  Maintain on oral hydrocortisone.    4. Type 2 diabetes :   Diet-controlled.   Consider SSI as needed.  5. UTI: Abx      Code Status: full DVT Prophylaxis:  heparin  Family Communication:   Disposition Plan: TBD  Time Spent in minutes 30 minutes  Procedures:   left hip anterior hemi arthropl  Consultants:   Dr Erlinda Hong  Antimicrobials:      Medications  Scheduled Meds: . [MAR Hold] amitriptyline  25 mg Oral QHS  . [MAR Hold] atenolol  100 mg Oral Daily  . [MAR Hold] cholecalciferol  3,000 Units Oral Daily  . [MAR Hold] diltiazem  240 mg Oral Daily  . [MAR Hold]  hydrocortisone  20 mg Oral Q breakfast   And  . [MAR Hold] hydrocortisone  10 mg Oral QAC supper  . [MAR Hold] levothyroxine  75 mcg Oral QAC breakfast  . [MAR Hold] memantine  10 mg Oral BID  . [MAR Hold] pantoprazole  40 mg Oral Daily  . povidone-iodine  2 application Topical Once  . [MAR Hold] rosuvastatin  5 mg Oral QHS   Continuous Infusions: . ceFAZolin    .  ceFAZolin (ANCEF) IV    . lactated ringers 75 mL/hr at 04/23/18 0636  . lactated ringers 10 mL/hr at 04/23/18 1134  . tranexamic acid     PRN Meds:.[MAR Hold] acetaminophen **OR** [MAR Hold] acetaminophen, [MAR Hold] fentaNYL (SUBLIMAZE) injection, [MAR Hold] hydrALAZINE, [MAR Hold] ondansetron **OR** [MAR Hold] ondansetron (ZOFRAN) IV   Antibiotics   Anti-infectives (From admission, onward)   Start     Dose/Rate  Route Frequency Ordered Stop   04/23/18 1118  ceFAZolin (ANCEF) 2-4 GM/100ML-% IVPB    Note to Pharmacy:  [name]   : cabinet override      04/23/18 1118 04/23/18 2329   04/23/18 1115  ceFAZolin (ANCEF) IVPB 2g/100 mL premix     2 g 200 mL/hr over 30 Minutes Intravenous On call to O.R. 04/23/18 1114 04/24/18 0559   04/23/18 0045  aztreonam (AZACTAM) 1 g in sodium chloride 0.9 % 100 mL IVPB     1 g 200 mL/hr over 30 Minutes Intravenous  Once 04/23/18 0037 04/23/18 0222        Subjective:   Merrillyn Yaden was seen and examined today.   No acute events noted overnight, no fever or chills a  Objective:   Vitals:   04/23/18 0300 04/23/18 0327 04/23/18 0440 04/23/18 0507  BP: (!) 131/102 (!) 124/98 133/76 133/76  Pulse:  64 62 62  Resp:  20 16 16   Temp:  98.1 F (36.7 C) 98.6 F (37 C) 98.6 F (37 C)  TempSrc:  Oral Oral Oral  SpO2:  96% 98%   Weight:    69.2 kg (152 lb 8.9 oz)  Height:    5\' 4"  (1.626 m)    Intake/Output Summary (Last 24 hours) at 04/23/2018 1202 Last data filed at 04/23/2018 0636 Gross per 24 hour  Intake 332.89 ml  Output -  Net 332.89 ml     Wt Readings from Last  3 Encounters:  04/23/18 69.2 kg (152 lb 8.9 oz)  01/10/18 71.8 kg (158 lb 3.2 oz)  10/30/16 64 kg (141 lb)     Exam  General: NAD  HEENT: NCAT,  PERRL,MMM  Neck: SUPPLE, (-) JVD  Cardiovascular: RRR, (-) GALLOP, (-) MURMUR  Respiratory: CTA  Gastrointestinal: SOFT, (-) DISTENSION, BS(+), (_) TENDERNESS  Ext: (-) CYANOSIS, (-) EDEMA  Neuro: Alert and responsive  Skin:(-) RASH  Psych:NORMAL AFFECT/MOOD   Data Reviewed:  I have personally reviewed following labs and imaging studies  Micro Results Recent Results (from the past 240 hour(s))  Surgical pcr screen     Status: Abnormal   Collection Time: 04/23/18  6:57 AM  Result Value Ref Range Status   MRSA, PCR NEGATIVE NEGATIVE Final   Staphylococcus aureus POSITIVE (A) NEGATIVE Final    Comment: (NOTE) The Xpert SA Assay (FDA approved for NASAL specimens in patients 34 years of age and older), is one component of a comprehensive surveillance program. It is not intended to diagnose infection nor to guide or monitor treatment.     Radiology Reports Dg Ribs Unilateral W/chest Left  Result Date: 04/23/2018 CLINICAL DATA:  Left axillary pain after fall. EXAM: LEFT RIBS AND CHEST - 3+ VIEW COMPARISON:  On 04/23/2013 FINDINGS: Remote left posterolateral sixth rib fracture. Cardiomegaly with moderate to marked aortic atherosclerosis with uncoiling of the thoracic aorta. No aneurysm. Atelectasis is identified at the left lung base. No pneumothorax or pulmonary contusions. Lumbar spinal fusion L3 through L5 with interbody blocks in place. Chronic L2 compression deformity with moderate 50% height loss. Inferior endplate compression of T8 with 50% height loss is also noted. These appear chronic and stable. IMPRESSION: 1. Remote left sixth rib fracture. No acute displaced rib fracture is currently noted. 2. Cardiomegaly with aortic atherosclerosis. 3. Thoracolumbar spondylosis with lower lumbar spinal fusion and chronic compression  fractures of T8 and L2. Electronically Signed   By: Ashley Royalty M.D.   On: 04/23/2018 00:21   Dg Hip  Unilat W Or Wo Pelvis 2-3 Views Left  Result Date: 04/23/2018 CLINICAL DATA:  Left hip pain after fall. EXAM: DG HIP (WITH OR WITHOUT PELVIS) 2-3V LEFT COMPARISON:  None. FINDINGS: An acute, varus angulated and slightly impacted left transcervical femoral neck fracture is identified. Lower lumbar spinal fusion hardware appears intact. The bony pelvis and pubic rami are intact. No acetabular fracture is noted. Partially included uncemented right bipolar hip arthroplasty is in place without complicating features. Dense bi-iliofemoral atherosclerosis. IMPRESSION: Acute varus angulated transcervical fracture of the left femur with mild impaction of the femoral neck upon the opposing femoral neck. Electronically Signed   By: Ashley Royalty M.D.   On: 04/23/2018 00:26    Lab Data:  CBC: Recent Labs  Lab 04/22/18 2313  WBC 9.8  NEUTROABS 7.3  HGB 12.7  HCT 40.6  MCV 98.8  PLT 867   Basic Metabolic Panel: Recent Labs  Lab 04/22/18 2313  NA 136  K 4.7  CL 103  CO2 24  GLUCOSE 139*  BUN 45*  CREATININE 1.77*  CALCIUM 8.7*   GFR: Estimated Creatinine Clearance: 24.2 mL/min (A) (by C-G formula based on SCr of 1.77 mg/dL (H)). Liver Function Tests: Recent Labs  Lab 04/22/18 2313  AST 19  ALT 13  ALKPHOS 55  BILITOT 0.6  PROT 6.8  ALBUMIN 3.6   No results for input(s): LIPASE, AMYLASE in the last 168 hours. No results for input(s): AMMONIA in the last 168 hours. Coagulation Profile: Recent Labs  Lab 04/22/18 2313  INR 0.93   Cardiac Enzymes: No results for input(s): CKTOTAL, CKMB, CKMBINDEX, TROPONINI in the last 168 hours. BNP (last 3 results) No results for input(s): PROBNP in the last 8760 hours. HbA1C: No results for input(s): HGBA1C in the last 72 hours. CBG: Recent Labs  Lab 04/23/18 1128  GLUCAP 119*   Lipid Profile: No results for input(s): CHOL, HDL, LDLCALC,  TRIG, CHOLHDL, LDLDIRECT in the last 72 hours. Thyroid Function Tests: No results for input(s): TSH, T4TOTAL, FREET4, T3FREE, THYROIDAB in the last 72 hours. Anemia Panel: No results for input(s): VITAMINB12, FOLATE, FERRITIN, TIBC, IRON, RETICCTPCT in the last 72 hours. Urine analysis:    Component Value Date/Time   COLORURINE YELLOW 04/22/2018 2359   APPEARANCEUR HAZY (A) 04/22/2018 2359   LABSPEC 1.013 04/22/2018 2359   PHURINE 6.0 04/22/2018 2359   GLUCOSEU NEGATIVE 04/22/2018 2359   HGBUR NEGATIVE 04/22/2018 2359   BILIRUBINUR NEGATIVE 04/22/2018 2359   KETONESUR NEGATIVE 04/22/2018 2359   PROTEINUR 30 (A) 04/22/2018 2359   UROBILINOGEN 0.2 04/01/2012 0510   NITRITE POSITIVE (A) 04/22/2018 2359   LEUKOCYTESUR SMALL (A) 04/22/2018 2359     Benito Mccreedy M.D. [facility] 04/23/2018, 12:02 PM  Pager: [phone] Between 7am to 7pm - call Pager - [phone]  After 7pm go to [email or web address] - password [email or web address]  Call night coverage person covering after 7pm

## 2018-04-23 NOTE — ED Notes (Signed)
[facility] on unit for pt transfer

## 2018-04-23 NOTE — H&P (Signed)
History and Physical    Katie Pace EXH:[id number] DOB: 1936/04/20 DOA: 04/22/2018  PCP: [name], MD   Patient coming from: Home  Chief Complaint: Fall with left-sided hip pain  HPI: Katie Pace is a 82 y.o. 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  She immediately developed severe left hip pain and she had to have her son help get her up and bring her to the emergency department.  She denies striking her head or any loss of consciousness.  She denies any lightheadedness or dizziness prior to the fall.  She states her hip pain is worse with any kind of movement and is better with rest.   ED Course: Vital signs stable with some elevated blood pressure readings noted.  Laboratory data remarkable for BUN 45 and creatinine 1.77.  Glucose 139.  Chest x-ray demonstrates old left-sided sixth rib fracture and hip x-ray demonstrates left femoral neck fracture with varus angulation.  EKG was sinus rhythm at 59 bpm.  Orthopedics have been consulted by ED physician and request transfer to [facility] under Hospitalist service for planned surgery in a.m.  Review of Systems: All others reviewed and otherwise negative.  Past Medical History:  Diagnosis Date  . AAA (abdominal aortic aneurysm) (HCC)    S/p aortic repair and endarterectomy 12/2011   . Anemia    hx  . Arthritis   . Blood transfusion    back in 1973  . C. difficile diarrhea   . Carotid stenosis, left    S/p left CEA 09/2008  . Diabetes mellitus    type 11  . Endocrine disturbance    gland has not  worked until 1970.Marland Kitchen..dx in 1977  . GERD (gastroesophageal reflux disease)    on meds to help....meds do well   . Heart murmur   . Hypertension   .  Hypothyroidism   . Pneumonia    hx  . PVD (peripheral vascular disease) ([place])   . Sheehan syndrome ([place]) 1977  . Stroke [facility])    Ischemic x 2       last one 2007    Past Surgical History:  Procedure Laterality Date  . ABDOMINAL AORTIC ANEURYSM REPAIR  12/09/2011   Procedure: ANEURYSM ABDOMINAL AORTIC REPAIR;  Surgeon: Mal Misty, MD;  Location: [place];  Service: Vascular;  Laterality: N/A;  resection and grafting abdominal aortic aneurysm; Insertion Bilateral External Iliac Graft; Reexploration Proximal Anastemosis; Aortic Endarterectomy; Reimplantation Inferior Mesenteric Artery; Intraaortic Graft  . ABDOMINAL AORTIC ANEURYSM REPAIR    . [place]  . APPENDECTOMY     1960  . BACK SURGERY     1990's  . [place]  &  2009  . CAROTID ENDARTERECTOMY  Dec. 2009  . CESAREAN SECTION    . EYE SURGERY Right May 2016   Pt. fell  . FRACTURE SURGERY     left ankle   . JOINT REPLACEMENT  May 2009   Right knee  . JOINT REPLACEMENT  April 2008   Right Hip prothesis  . SPINE SURGERY  Jan. 14,2014   Lumbar Diskectomy     reports that she quit smoking about 56 years ago. Her smoking  use included cigarettes. She has a 2.00 pack-year smoking history. She has never used smokeless tobacco. She reports that she does not drink alcohol or use drugs.  Allergies  Allergen Reactions  . Codeine Nausea Only  . Dilaudid [Hydromorphone Hcl] Nausea Only  . Lincomycin Hcl Other (See Comments)    Big bumps bilateral legs  . Macrodantin Hives  . Zolpidem Tartrate Other (See Comments)    Stroke  . Azithromycin Rash  . Penicillins Rash    Family History  Problem Relation Age of Onset  . Heart disease Mother   . Peripheral vascular disease Mother   . Hypertension Mother   . Varicose Veins Mother   . Alcohol abuse Father   . Varicose Veins Father   . Heart disease Sister   . Heart disease Brother     Prior to Admission medications     Medication Sig Start Date End Date Taking? Authorizing Provider  amitriptyline (ELAVIL) 25 MG tablet Take 25 mg by mouth at bedtime.    [provider]  atenolol (TENORMIN) 50 MG tablet Take 100 mg by mouth daily.     [provider]  cholecalciferol (VITAMIN D) 1000 UNITS tablet Take 3,000 Units by mouth daily. Reported on 10/29/2015    [provider]  diltiazem (CARDIZEM CD) 240 MG 24 hr capsule Take 240 mg by mouth daily.    [provider]  dipyridamole-aspirin (AGGRENOX) 25-200 MG per 12 hr capsule Take 1 capsule by mouth 2 (two) times daily.    [provider]  esomeprazole (NEXIUM) 40 MG capsule Take 40 mg by mouth 2 (two) times daily.     [provider]  glucose monitoring kit (FREESTYLE) monitoring kit 1 each by Does not apply route as needed. Reported on 10/29/2015    [provider]  hydrocortisone (CORTEF) 20 MG tablet Take 10-20 mg by mouth daily. Takes 33m in the morning and takes 136min the evening.    [provider]  levothyroxine (SYNTHROID, LEVOTHROID) 75 MCG tablet Take 75 mcg by mouth daily.    [provider]  losartan (COZAAR) 100 MG tablet Take 100 mg by mouth daily.    [provider]  memantine (NAMENDA) 10 MG tablet Take 10 mg by mouth 2 (two) times daily.    [provider]  oxyCODONE-acetaminophen (PERCOCET/ROXICET) 5-325 MG per tablet Take 1-2 tablets by mouth every 4 (four) hours as needed for pain. 10/26/12   ElKristeen MissMD  potassium citrate (UROCIT-K) 10 MEQ (1080 MG) SR tablet Take 20 mEq by mouth every morning.     [provider]  rosuvastatin (CRESTOR) 5 MG tablet Take 5 mg by mouth at bedtime.     [provider]    Physical Exam: Vitals:   04/22/18 2330 04/23/18 0043 04/23/18 0100 04/23/18 0130  BP: (!) 152/97 (!) 152/96 136/78 139/82  Pulse: (!) 59 67 (!) 58 (!) 54  Resp: 11 16 (!) 25   Temp:  98 F (36.7 C)    TempSrc:  Oral     SpO2: 92% 100% 98% 91%    Constitutional: NAD, calm, comfortable Vitals:   04/22/18 2330 04/23/18 0043 04/23/18 0100 04/23/18 0130  BP: (!) 152/97 (!) 152/96 136/78 139/82  Pulse: (!) 59 67 (!) 58 (!) 54  Resp: 11 16 (!) 25   Temp:  98 F (36.7 C)    TempSrc:  Oral    SpO2: 92% 100% 98% 91%   Eyes: lids and conjunctivae  normal ENMT: Mucous membranes are moist.  Neck: normal, supple Respiratory: clear to auscultation bilaterally. Normal respiratory effort. No accessory muscle use.  Cardiovascular: Regular rate and rhythm, no murmurs. No extremity edema. Abdomen: no tenderness, no distention. Bowel sounds positive.  Musculoskeletal: Left lower extremity shortened with external rotation and no obvious ecchymosis. Skin: no rashes, lesions, ulcers.   Labs on Admission: I have personally reviewed following labs and imaging studies  CBC: Recent Labs  Lab 04/22/18 2313  WBC 9.8  NEUTROABS 7.3  HGB 12.7  HCT 40.6  MCV 98.8  PLT 416   Basic Metabolic Panel: Recent Labs  Lab 04/22/18 2313  NA 136  K 4.7  CL 103  CO2 24  GLUCOSE 139*  BUN 45*  CREATININE 1.77*  CALCIUM 8.7*   GFR: CrCl cannot be calculated (Unknown ideal weight.). Liver Function Tests: Recent Labs  Lab 04/22/18 2313  AST 19  ALT 13  ALKPHOS 55  BILITOT 0.6  PROT 6.8  ALBUMIN 3.6   No results for input(s): LIPASE, AMYLASE in the last 168 hours. No results for input(s): AMMONIA in the last 168 hours. Coagulation Profile: Recent Labs  Lab 04/22/18 2313  INR 0.93   Cardiac Enzymes: No results for input(s): CKTOTAL, CKMB, CKMBINDEX, TROPONINI in the last 168 hours. BNP (last 3 results) No results for input(s): PROBNP in the last 8760 hours. HbA1C: No results for input(s): HGBA1C in the last 72 hours. CBG: No results for input(s): GLUCAP in the last 168 hours. Lipid Profile: No results for input(s): CHOL, HDL, LDLCALC, TRIG, CHOLHDL, LDLDIRECT in the last 72 hours. Thyroid Function  Tests: No results for input(s): TSH, T4TOTAL, FREET4, T3FREE, THYROIDAB in the last 72 hours. Anemia Panel: No results for input(s): VITAMINB12, FOLATE, FERRITIN, TIBC, IRON, RETICCTPCT in the last 72 hours. Urine analysis:    Component Value Date/Time   COLORURINE YELLOW 04/22/2018 2359   APPEARANCEUR HAZY (A) 04/22/2018 2359   LABSPEC 1.013 04/22/2018 2359   PHURINE 6.0 04/22/2018 2359   GLUCOSEU NEGATIVE 04/22/2018 2359   HGBUR NEGATIVE 04/22/2018 2359   BILIRUBINUR NEGATIVE 04/22/2018 2359   KETONESUR NEGATIVE 04/22/2018 2359   PROTEINUR 30 (A) 04/22/2018 2359   UROBILINOGEN 0.2 04/01/2012 0510   NITRITE POSITIVE (A) 04/22/2018 2359   LEUKOCYTESUR SMALL (A) 04/22/2018 2359    Radiological Exams on Admission: Dg Ribs Unilateral W/chest Left  Result Date: 04/23/2018 CLINICAL DATA:  Left axillary pain after fall. EXAM: LEFT RIBS AND CHEST - 3+ VIEW COMPARISON:  On 04/23/2013 FINDINGS: Remote left posterolateral sixth rib fracture. Cardiomegaly with moderate to marked aortic atherosclerosis with uncoiling of the thoracic aorta. No aneurysm. Atelectasis is identified at the left lung base. No pneumothorax or pulmonary contusions. Lumbar spinal fusion L3 through L5 with interbody blocks in place. Chronic L2 compression deformity with moderate 50% height loss. Inferior endplate compression of T8 with 50% height loss is also noted. These appear chronic and stable. IMPRESSION: 1. Remote left sixth rib fracture. No acute displaced rib fracture is currently noted. 2. Cardiomegaly with aortic atherosclerosis. 3. Thoracolumbar spondylosis with lower lumbar spinal fusion and chronic compression fractures of T8 and L2. Electronically Signed   By: Ashley Royalty M.D.   On: 04/23/2018 00:21   Dg Hip Unilat W Or Wo Pelvis 2-3 Views Left  Result Date: 04/23/2018 CLINICAL DATA:  Left hip pain after fall. EXAM: DG HIP (WITH OR WITHOUT PELVIS) 2-3V LEFT COMPARISON:  None. FINDINGS: An acute, varus angulated  and slightly impacted left transcervical femoral  neck fracture is identified. Lower lumbar spinal fusion hardware appears intact. The bony pelvis and pubic rami are intact. No acetabular fracture is noted. Partially included uncemented right bipolar hip arthroplasty is in place without complicating features. Dense bi-iliofemoral atherosclerosis. IMPRESSION: Acute varus angulated transcervical fracture of the left femur with mild impaction of the femoral neck upon the opposing femoral neck. Electronically Signed   By: Ashley Royalty M.D.   On: 04/23/2018 00:26    EKG: Independently reviewed.  SR at 59bpm; LVH.  Assessment/Plan Principal Problem:   Femur fracture, left (HCC) Active Problems:   Sheehan syndrome (HCC)   Hypertension   Diabetes mellitus ([place])   Acute renal failure (HCC)   CKD (chronic kidney disease) stage 3, GFR 30-59 ml/min (HCC)   Hypothyroidism    1. Acute left femoral neck fracture with varus angulation secondary to mechanical fall.  Keep n.p.o except meds and transfer to [facility] for further evaluation per orthopedics.  She denies any current pain and will maintain on IV fentanyl as needed for pain control. 2. AKI on CKD stage III versus CKD progression.  Maintain on gentle IV fluid and avoid nephrotoxic agents.  Repeat renal panel in a.m and consider further work-up as needed. 3. Sheehan syndrome.  Maintain on oral hydrocortisone.  Avoid stress dose currently due to elevated blood pressure readings. 4. Hypertension.  Blood pressure currently elevated.  Maintain on home atenolol, diltiazem, and hold losartan due to AKI.  Hydralazine pushes as needed for severe elevations. 5. Hypothyroidism.  Maintain on levothyroxine. 6. Type 2 diabetes.  Not currently on any medications and appears to be diet controlled.  No significant hyperglycemia noted.  Consider SSI as needed. 7. Dementia.  Maintain on Namenda. 8. Dyslipidemia.  Maintain on Crestor.   DVT prophylaxis: Heparin Code  Status: Full Family Communication: Son and husband at bedside Disposition Plan:Transfer to [facility] for Orthopedic Evaluation Consults called:Orthopedics; EDP spoke with Dr. Erlinda Hong who requested transfer Admission status: Inpatient, Med-Surg   [place] [facility] Pager [phone]  If 7PM-7AM, please contact night-coverage [email or web address] Password [email or web address]  04/23/2018, 2:12 AM

## 2018-04-23 NOTE — Op Note (Addendum)
ANTERIOR LEFT HIP HEMIARTHROPALSTY  Procedure Note Katie Pace   [id number]  Pre-op Diagnosis: LEFT HIP FRACTURE     Post-op Diagnosis: same   Operative Procedures  1. Prosthetic replacement for femoral neck fracture. CPT [phone]  Personnel  Surgeon(s): Leandrew Koyanagi, MD  ASSIST: Madalyn Rob, PA-C; necessary for the timely completion of procedure and due to complexity of procedure.   Anesthesia: general  Prosthesis: Depuy Femur: Corail KA 11 Head: 47 mm size: +1.5 Bearing Type: bipolar  Hip Hemiarthroplasty (Anterior Approach) Op Note:  After informed consent was obtained and the operative extremity marked in the holding area, the patient was brought back to the operating room and placed supine on the HANA table. Next, the operative extremity was prepped and draped in normal sterile fashion. Surgical timeout occurred verifying patient identification, surgical site, surgical procedure and administration of antibiotics.  A modified anterior Smith-Peterson approach to the hip was performed, using the interval between tensor fascia lata and sartorius.  Dissection was carried bluntly down onto the anterior hip capsule. The lateral femoral circumflex vessels were identified and coagulated. A capsulotomy was performed and the capsular flaps tagged for later repair.  Fluoroscopy was utilized to prepare for the femoral neck cut. The neck osteotomy was performed. The femoral head was removed and found a 47 mm head was the appropriate fit.    We then turned our attention to the femur.  After placing the femoral hook, the leg was taken to externally rotated, extended and adducted position taking care to perform soft tissue releases to allow for adequate mobilization of the femur. Soft tissue was cleared from the shoulder of the greater trochanter and the hook elevator used to improve exposure of the proximal femur. Sequential broaching performed up to a size 11. Trial neck and head were  placed. The leg was brought back up to neutral and the construct reduced. The position and sizing of components, offset and leg lengths were checked using fluoroscopy. Stability of the construct was checked in extension and external rotation without any subluxation or impingement of prosthesis. We dislocated the prosthesis, dropped the leg back into position, removed trial components, and irrigated copiously. The final stem and head was then placed, the leg brought back up, the system reduced and fluoroscopy used to verify positioning.  We irrigated, obtained hemostasis and closed the capsule using #2 ethibond suture.  The fascia was closed with #1 vicryl plus, the deep fat layer was closed with 0 vicryl, the subcutaneous layers closed with 2.0 Vicryl Plus and the skin closed with staples. A sterile dressing was applied. The patient was awakened in the operating room and taken to recovery in stable condition. All sponge, needle, and instrument counts were correct at the end of the case.   Position: supine  Complications: none.  Time Out: performed   Drains/Packing: none  Estimated blood loss: 150 cc  Returned to Recovery Room: in good condition.   Antibiotics: yes   Mechanical VTE (DVT) Prophylaxis: sequential compression devices, TED thigh-high  Chemical VTE (DVT) Prophylaxis: resume aggrenox in the morning  Fluid Replacement: Crystalloid: see anesthesia record  Specimens Removed: 1 to pathology   Sponge and Instrument Count Correct? yes   PACU: portable radiograph - low AP   Admission: inpatient status, start PT & OT POD#1  Plan/RTC: Return in 2 weeks for staple removal. Return in 6 weeks to see MD.  Weight Bearing/Load Lower Extremity: full  Hip precautions: none Suture Removal: 10-14 days  Betadine to incision twice daily once dressing is removed on POD#7  N. Eduard Roux, MD [place] [phone] 2:08 PM

## 2018-04-23 NOTE — ED Notes (Signed)
Report to Tiffany with [facility]  

## 2018-04-23 NOTE — ED Provider Notes (Signed)
[facility] EMERGENCY DEPARTMENT Provider Note   CSN: [id number] Arrival date & time: 04/22/18  2229  Time seen 23:05 PM    History   Chief Complaint Chief Complaint  Patient presents with  . Fall   Level 5 caveat due to dementia  HPI Katie Pace is a 82 y.o. female.  HPI husband states patient was trying to go into the bathroom this evening around 10 PM using her walker and she got her walker hung up on a table and was unable to get it loose and she fell over onto her left side.  She denies hitting her head and he denies loss of consciousness.  She complains of a lot of pain over her left lateral chest and he states she was complaining of pain in her left knee and her left hip.  He had to have his sons come over to help him get her up.  PCP [name], MD Orthoped[facility]  Past Medical History:  Diagnosis Date  . AAA (abdominal aortic aneurysm) (HCC)    S/p aortic repair and endarterectomy 12/2011   . Anemia    hx  . Arthritis   . Blood transfusion    back in 1973  . C. difficile diarrhea   . Carotid stenosis, left    S/p left CEA 09/2008  . Diabetes mellitus    type 11  . Endocrine disturbance    gland has not  worked until 1970.Marland Kitchen..dx in 1977  . GERD (gastroesophageal reflux disease)    on meds to help....meds do well   . Heart murmur   . Hypertension   . Hypothyroidism   . Pneumonia    hx  . PVD (peripheral vascular disease) ([place])   . 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 Stroke [facility])    Ischemic x 2       last one 2007    Patient Active Problem List   Diagnosis Date Noted  . PAD (peripheral artery disease) ([place]) 02/18/2016  . Carotid stenosis, asymptomatic 10/29/2015  . Difficulty in walking(719.7) 06/26/2013  . Bilateral leg weakness 06/26/2013  . Occlusion and stenosis of carotid artery without mention of cerebral infarction 04/04/2013  . Aftercare following surgery of the circulatory system, [place] 04/04/2013  . Anemia 10/19/2012  .  Spondylolisthesis of lumbar region L3-4 L4-5 with stenosis 10/18/2012  . CKD (chronic kidney disease), stage II 10/18/2012  . C. difficile diarrhea 04/02/2012  . Acute renal failure ([place]) 04/01/2012  . Encephalopathy 04/01/2012  . Sepsis(995.91) 04/01/2012  . Vomiting 04/01/2012  . Hypokalemia 04/01/2012  . Chest pain 02/08/2012  . Renal insufficiency 02/08/2012  . Sheehan syndrome ([place])   . Hypertension   . Diabetes mellitus ([place])   . AAA (abdominal aortic aneurysm) ([place]) 12/28/2011  . Abdominal aneurysm without mention of rupture 11/09/2011    Past Surgical History:  Procedure Laterality Date  . ABDOMINAL AORTIC ANEURYSM REPAIR  12/09/2011   Procedure: ANEURYSM ABDOMINAL AORTIC REPAIR;  Surgeon: Mal Misty, MD;  Location: [place];  Service: Vascular;  Laterality: N/A;  resection and grafting abdominal aortic aneurysm; Insertion Bilateral External Iliac Graft; Reexploration Proximal Anastemosis; Aortic Endarterectomy; Reimplantation Inferior Mesenteric Artery; Intraaortic Graft  . ABDOMINAL AORTIC ANEURYSM REPAIR    . [place]  . APPENDECTOMY     1960  . BACK SURGERY     1990's  . [place]  &  2009  . CAROTID ENDARTERECTOMY  Dec. 2009  . CESAREAN SECTION    . EYE SURGERY Right May 2016   Pt. fell  . FRACTURE SURGERY     left ankle   . JOINT REPLACEMENT  May 2009   Right knee  . JOINT REPLACEMENT  April 2008   Right Hip prothesis  . SPINE SURGERY  Jan. 14,2014   Lumbar Diskectomy     OB History   None      Home Medications    Prior to Admission medications   Medication Sig Start Date End Date Taking? Authorizing Provider  amitriptyline (ELAVIL) 25 MG tablet Take 25 mg by mouth at bedtime.    [provider]  atenolol (TENORMIN) 50 MG tablet Take 100 mg by mouth daily.     [provider]  cholecalciferol (VITAMIN D) 1000 UNITS tablet Take 3,000 Units by mouth daily. Reported on 10/29/2015     [provider]  diltiazem (CARDIZEM CD) 240 MG 24 hr capsule Take 240 mg by mouth daily.    [provider]  dipyridamole-aspirin (AGGRENOX) 25-200 MG per 12 hr capsule Take 1 capsule by mouth 2 (two) times daily.    [provider]  esomeprazole (NEXIUM) 40 MG capsule Take 40 mg by mouth 2 (two) times daily.     [provider]  glucose monitoring kit (FREESTYLE) monitoring kit 1 each by Does not apply route as needed. Reported on 10/29/2015    [provider]  hydrocortisone (CORTEF) 20 MG tablet Take 10-20 mg by mouth daily. Takes '20mg'$  in the morning and takes '10mg'$  in the evening.    [provider]  levothyroxine (SYNTHROID, LEVOTHROID) 75 MCG tablet Take 75 mcg by mouth daily.    [provider]  losartan (COZAAR) 100 MG tablet Take 100 mg by mouth daily.    [provider]  memantine (NAMENDA) 10 MG tablet Take 10 mg by mouth 2 (two) times daily.    [provider]  oxyCODONE-acetaminophen (PERCOCET/ROXICET) 5-325 MG per tablet Take 1-2 tablets by mouth every 4 (four) hours as needed for pain. 10/26/12   Kristeen Miss, MD  potassium citrate (UROCIT-K) 10 MEQ (1080 MG) SR tablet Take 20 mEq by mouth every morning.     [provider]  rosuvastatin (CRESTOR) 5 MG tablet Take 5 mg by mouth at bedtime.     [provider]    Family History Family History  Problem Relation Age of Onset  . Heart disease Mother   . Peripheral vascular disease Mother   . Hypertension Mother   . Varicose Veins Mother   . Alcohol abuse Father   . Varicose Veins Father   . Heart disease Sister   . Heart disease Brother     Social History Social History   Tobacco Use  . Smoking status: Former Smoker    Packs/day: 0.50    Years: 4.00    Pack years: 2.00    Types: Cigarettes    Last attempt to quit: 10/11/1961    Years since quitting: 56.5  . Smokeless tobacco: Never Used  Substance Use Topics  . Alcohol  use: No    Alcohol/week: 0.0 oz  . 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  Non-toxic appearance. She does not appear ill. No distress.  Patient is very quick to tell me her name and that she is a retired Marine scientist however any other questions I asked her she looks to her husband for the answer.  HENT:  Head: Normocephalic and atraumatic.  Right Ear: External ear normal.  Left Ear: External ear normal.  Nose: Nose normal. No mucosal edema or rhinorrhea.  Mouth/Throat: Oropharynx is clear and moist and mucous membranes are normal. No dental abscesses or uvula swelling.  Her head is nontender and without trauma  Eyes: Pupils are equal, round, and reactive to light. Conjunctivae and EOM are normal.  Neck: Normal range of motion and full passive range of motion without pain. Neck supple.  Her cervical spine is nontender to palpation  Cardiovascular: Normal rate, regular rhythm and normal heart sounds. Exam reveals no gallop and no friction rub.  No murmur heard. Pulmonary/Chest: Effort normal and breath sounds normal. No respiratory distress. She has no wheezes. She has no rhonchi. She has no rales. She exhibits tenderness. She exhibits no crepitus.  Patient complains of pain over her left lateral chest wall without bruising or crepitance.    Abdominal: Soft. Normal appearance and bowel sounds are normal. She exhibits no distension. There is no tenderness. There is no rebound and no guarding.  Musculoskeletal: Normal range of motion. She exhibits tenderness and  deformity. She exhibits no edema.  Moves all extremities well except for her left lower extremity, her left lower extremity is noted to be shortened and she has external rotation.  Her left knee is without obvious trauma, no bruising no abrasion and no effusion noted.  She is tender in her left hip area.  I did not ask her to try to do range of motion of her left lower extremity.  Neurological: She is alert. She has normal strength. No cranial nerve deficit.  Skin: Skin is warm, dry and intact. No rash noted. No erythema. There is pallor.  Psychiatric: She has a normal mood and affect. Her speech is normal and behavior is normal. Her mood appears not anxious.  Nursing note and vitals reviewed.    ED Treatments / Results  Labs (all labs ordered are listed, but only abnormal results are displayed) Results for orders placed or performed during the hospital encounter of 04/22/18  Comprehensive metabolic panel  Result Value Ref Range   Sodium 136 135 - 145 mmol/L   Potassium 4.7 3.5 - 5.1 mmol/L   Chloride 103 98 - 111 mmol/L   CO2 24 22 - 32 mmol/L   Glucose, Bld 139 (H) 70 - 99 mg/dL   BUN 45 (H) 8 - 23 mg/dL   Creatinine, Ser 1.77 (H) 0.44 - 1.00 mg/dL   Calcium 8.7 (L) 8.9 - 10.3 mg/dL   Total Protein 6.8 6.5 - 8.1 g/dL   Albumin 3.6 3.5 - 5.0 g/dL   AST 19 15 - 41 U/L   ALT 13 0 - 44 U/L   Alkaline Phosphatase 55 38 - 126 U/L   Total Bilirubin 0.6 0.3 - 1.2 mg/dL   GFR calc non Af Amer 26 (L) >60 mL/min   GFR calc Af Amer 30 (L) >60 mL/min   Anion gap 9 5 - 15  CBC with Differential  Result Value Ref Range  WBC 9.8 4.0 - 10.5 K/uL   RBC 4.11 3.87 - 5.11 MIL/uL   Hemoglobin 12.7 12.0 - 15.0 g/dL   HCT 40.6 36.0 - 46.0 %   MCV 98.8 78.0 - 100.0 fL   MCH 30.9 26.0 - 34.0 pg   MCHC 31.3 30.0 - 36.0 g/dL   RDW 14.3 11.5 - 15.5 %   Platelets 178 150 - 400 K/uL   Neutrophils Relative % 75 %   Neutro Abs 7.3 1.7 - 7.7 K/uL   Lymphocytes Relative 16 %   Lymphs Abs 1.6 0.7 - 4.0  K/uL   Monocytes Relative 8 %   Monocytes Absolute 0.8 0.1 - 1.0 K/uL   Eosinophils Relative 1 %   Eosinophils Absolute 0.1 0.0 - 0.7 K/uL   Basophils Relative 0 %   Basophils Absolute 0.0 0.0 - 0.1 K/uL  Protime-INR  Result Value Ref Range   Prothrombin Time 12.4 11.4 - 15.2 seconds   INR 0.93   Urinalysis, Routine w reflex microscopic  Result Value Ref Range   Color, Urine YELLOW YELLOW   APPearance HAZY (A) CLEAR   Specific Gravity, Urine 1.013 1.005 - 1.030   pH 6.0 5.0 - 8.0   Glucose, UA NEGATIVE NEGATIVE mg/dL   Hgb urine dipstick NEGATIVE NEGATIVE   Bilirubin Urine NEGATIVE NEGATIVE   Ketones, ur NEGATIVE NEGATIVE mg/dL   Protein, ur 30 (A) NEGATIVE mg/dL   Nitrite POSITIVE (A) NEGATIVE   Leukocytes, UA SMALL (A) NEGATIVE   RBC / HPF 0-5 0 - 5 RBC/hpf   WBC, UA 6-10 0 - 5 WBC/hpf   Bacteria, UA FEW (A) NONE SEEN   Squamous Epithelial / LPF 0-5 0 - 5  APTT  Result Value Ref Range   aPTT 28 24 - 36 seconds  Sample to Blood Bank  Result Value Ref Range   Blood Bank Specimen SAMPLE AVAILABLE FOR TESTING    Sample Expiration      04/23/2018 Performed at [facility], [address] [place], [place] [address]    Laboratory interpretation all normal except renal insufficiency which is new since her last blood work done in 2014, UTI    EKG EKG Interpretation  Date/Time:  Friday April 22 2018 23:19:48 EDT Ventricular Rate:  59 PR Interval:    QRS Duration: 82 QT Interval:  439 QTC Calculation: 435 R Axis:   -9 Text Interpretation:  Sinus rhythm LVH by voltage Inferior infarct, old Since last tracing rate slower 11 Oct 2012 Baseline wander Confirmed by Rolland Porter [phone]) on 04/22/2018 11:30:23 PM   Radiology Dg Ribs Unilateral W/chest Left  Result Date: 04/23/2018 CLINICAL DATA:  Left axillary pain after fall. EXAM: LEFT RIBS AND CHEST - 3+ VIEW COMPARISON:  On 04/23/2013 FINDINGS: Remote left posterolateral sixth rib fracture. Cardiomegaly with moderate to  marked aortic atherosclerosis with uncoiling of the thoracic aorta. No aneurysm. Atelectasis is identified at the left lung base. No pneumothorax or pulmonary contusions. Lumbar spinal fusion L3 through L5 with interbody blocks in place. Chronic L2 compression deformity with moderate 50% height loss. Inferior endplate compression of T8 with 50% height loss is also noted. These appear chronic and stable. IMPRESSION: 1. Remote left sixth rib fracture. No acute displaced rib fracture is currently noted. 2. Cardiomegaly with aortic atherosclerosis. 3. Thoracolumbar spondylosis with lower lumbar spinal fusion and chronic compression fractures of T8 and L2. Electronically Signed   By: Ashley Royalty M.D.   On: 04/23/2018 00:21   Dg Hip Unilat W  Or Wo Pelvis 2-3 Views Left  Result Date: 04/23/2018 CLINICAL DATA:  Left hip pain after fall. EXAM: DG HIP (WITH OR WITHOUT PELVIS) 2-3V LEFT COMPARISON:  None. FINDINGS: An acute, varus angulated and slightly impacted left transcervical femoral neck fracture is identified. Lower lumbar spinal fusion hardware appears intact. The bony pelvis and pubic rami are intact. No acetabular fracture is noted. Partially included uncemented right bipolar hip arthroplasty is in place without complicating features. Dense bi-iliofemoral atherosclerosis. IMPRESSION: Acute varus angulated transcervical fracture of the left femur with mild impaction of the femoral neck upon the opposing femoral neck. Electronically Signed   By: Ashley Royalty M.D.   On: 04/23/2018 00:26    Procedures .Critical Care Performed by: Rolland Porter, MD Authorized by: Rolland Porter, MD   Critical care provider statement:    Critical care time (minutes):  31   Critical care was necessary to treat or prevent imminent or life-threatening deterioration of the following conditions:  Trauma   Critical care was time spent personally by me on the following activities:  Discussions with consultants, examination of patient,  obtaining history from patient or surrogate, ordering and review of laboratory studies, ordering and review of radiographic studies, pulse oximetry, re-evaluation of patient's condition and review of old charts   (including critical care time)  Medications Ordered in ED Medications  aztreonam (AZACTAM) 1 g in sodium chloride 0.9 % 100 mL IVPB (1 g Intravenous New Bag/Given 04/23/18 0135)  fentaNYL (SUBLIMAZE) injection 50 mcg (50 mcg Intravenous Given 04/22/18 2325)  fentaNYL (SUBLIMAZE) injection 50 mcg (50 mcg Intravenous Given 04/23/18 0135)     Initial Impression / Assessment and Plan / ED Course  I have reviewed the triage vital signs and the nursing notes.  Pertinent labs & imaging results that were available during my care of the patient were reviewed by me and considered in my medical decision making (see chart for details).   X-rays were obtained of her left ribs and her left hip.  I did not obtain x-rays of her left knee, this is most likely referred pain from her hip fracture.  Laboratory testing was started.  Patient was given pain medication as needed.  After reviewing her laboratory test results patient was given IV antibiotics for her UTI.  12:15 AM talked to husband and son about her hip xray which shows a fracture of the femoral neck and she will require a hip replacment. She already has had a THR and TKR on the right by Dr Marlou Sa and Dr Sharol Given ([place]).  They are requesting care by that group again for her fracture.   01:39 AM Dr Erlinda Hong, Orthopedics, states to keep NPO, have hospitalist admit to [facility]  01:55 AM Dr Manuella Ghazi, hospitalist, will admit to [facility]   Final Clinical Impressions(s) / ED Diagnoses   Final diagnoses:  Closed fracture of neck of left femur, initial encounter ([place])  Acute cystitis without hematuria  Renal insufficiency  Contusion of left chest wall, initial encounter    Plan admission  Rolland Porter, MD, Barbette Or, MD 04/23/18 [phone]

## 2018-04-23 NOTE — Plan of Care (Signed)

## 2018-04-23 NOTE — Consult Note (Signed)
ORTHOPAEDIC CONSULTATION  REQUESTING PHYSICIAN: Benito Mccreedy, MD  Chief Complaint: Left femoral neck hip fracture  HPI: Katie Pace is a 82 y.o. female who presents with left hip fracture s/p mechanical fall PTA after her walker was caught in a rug.  The patient lives at home with her husband.  She does have advanced dementia.  Patient has a history of diabetes, chronic kidney disease.  Patient's husband and sister are at the bedside to provide HPI details.  Past Medical History:  Diagnosis Date  . AAA (abdominal aortic aneurysm) (HCC)    S/p aortic repair and endarterectomy 12/2011   . Anemia    hx  . Arthritis   . Blood transfusion    back in 1973  . C. difficile diarrhea   . Carotid stenosis, left    S/p left CEA 09/2008  . Diabetes mellitus    type 11  . Endocrine disturbance    gland has not  worked until 1970.Marland Kitchen..dx in 1977  . GERD (gastroesophageal reflux disease)    on meds to help....meds do well   . Heart murmur   . Hypertension   . Hypothyroidism   . Pneumonia    hx  . PVD (peripheral vascular disease) ([place])   . 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 Stroke [facility])    Ischemic x 2       last one 2007   Past Surgical History:  Procedure Laterality Date  . ABDOMINAL AORTIC ANEURYSM REPAIR  12/09/2011   Procedure: ANEURYSM ABDOMINAL AORTIC REPAIR;  Surgeon: Mal Misty, MD;  Location: [place];  Service: Vascular;  Laterality: N/A;  resection and grafting abdominal aortic aneurysm; Insertion Bilateral External Iliac Graft; Reexploration Proximal Anastemosis; Aortic Endarterectomy; Reimplantation Inferior Mesenteric Artery; Intraaortic Graft  . ABDOMINAL AORTIC ANEURYSM REPAIR    . [place]  . APPENDECTOMY     1960  . BACK SURGERY     1990's  . [place]  &  2009  . CAROTID ENDARTERECTOMY  Dec. 2009  . CESAREAN SECTION    . EYE SURGERY Right May 2016   Pt. fell  . FRACTURE SURGERY     left ankle   .  JOINT REPLACEMENT  May 2009   Right knee  . JOINT REPLACEMENT  April 2008   Right Hip prothesis  . SPINE SURGERY  Jan. 14,2014   Lumbar Diskectomy   Social History   Socioeconomic History  . Marital status: Married    Spouse name: Not on file  . Number of children: Not on file  . Years of education: Not on file  . Highest education level: Not on file  Occupational History  . Not on file  Social Needs  . Financial resource strain: Not on file  . Food insecurity:    Worry: Not on file    Inability: Not on file  . Transportation needs:    Medical: Not on file    Non-medical: Not on file  Tobacco Use  . Smoking status: Former Smoker    Packs/day: 0.50    Years: 4.00    Pack years: 2.00    Types: Cigarettes    Last attempt to quit: 10/11/1961    Years since quitting: 56.5  . Smokeless tobacco: Never Used  Substance and Sexual Activity  . Alcohol use: No    Alcohol/week: 0.0 oz  . Drug use: No  . Sexual activity: Not on file  Lifestyle  . Physical activity:    Days per week: Not on file    Minutes per session: Not on file  . Stress: Not on file  Relationships  . Social connections:    Talks on phone: Not on file    Gets together: Not on file    Attends religious service: Not on file    Active member of club or organization: Not on file    Attends meetings of clubs or organizations: Not on file    Relationship status: Not on file  Other Topics Concern  . Not on file  Social History Narrative  . Not on file   Family History  Problem Relation Age of Onset  . Heart disease Mother   . Peripheral vascular disease Mother   . Hypertension Mother   . Varicose Veins Mother   . Alcohol abuse Father   . Varicose Veins Father   . Heart disease Sister   . Heart disease Brother    Allergies  Allergen Reactions  . Codeine Nausea Only  . Dilaudid [Hydromorphone Hcl] Nausea Only  . Lincomycin Hcl Other (See Comments)    Big bumps bilateral legs  . Macrodantin Hives  .  Zolpidem Tartrate Other (See Comments)    Stroke  . Azithromycin Rash  . Penicillins Rash   Prior to Admission medications   Medication Sig Start Date End Date Taking? Authorizing Provider  amitriptyline (ELAVIL) 25 MG tablet Take 25 mg by mouth at bedtime.    [provider]  atenolol (TENORMIN) 50 MG tablet Take 100 mg by mouth daily.     [provider]  cholecalciferol (VITAMIN D) 1000 UNITS tablet Take 3,000 Units by mouth daily. Reported on 10/29/2015    [provider]  diltiazem (CARDIZEM CD) 240 MG 24 hr capsule Take 240 mg by mouth daily.    [provider]  dipyridamole-aspirin (AGGRENOX) 25-200 MG per 12 hr capsule Take 1 capsule by mouth 2 (two) times daily.    [provider]  esomeprazole (NEXIUM) 40 MG capsule Take 40 mg by mouth 2 (two) times daily.     [provider]  glucose monitoring kit (FREESTYLE) monitoring kit 1 each by Does not apply route as needed. Reported on 10/29/2015    [provider]  hydrocortisone (CORTEF) 20 MG tablet Take 10-20 mg by mouth daily. Takes 73m in the morning and takes 174min the evening.    [provider]  levothyroxine (SYNTHROID, LEVOTHROID) 75 MCG tablet Take 75 mcg by mouth daily.    [provider]  losartan (COZAAR) 100 MG tablet Take 100 mg by mouth daily.    [provider]  memantine (NAMENDA) 10 MG tablet Take 10 mg by mouth 2 (two) times daily.    [provider]  oxyCODONE-acetaminophen (PERCOCET/ROXICET) 5-325 MG per tablet Take 1-2 tablets by mouth every 4 (four) hours as needed for pain. 10/26/12   ElKristeen MissMD  potassium citrate (UROCIT-K) 10 MEQ (1080 MG) SR tablet Take 20 mEq by mouth every morning.     [provider]  rosuvastatin (CRESTOR) 5 MG tablet Take 5 mg by mouth at bedtime.     [provider]   Dg Ribs Unilateral W/chest Left  Result Date: 04/23/2018 CLINICAL DATA:  Left axillary pain after  fall. EXAM: LEFT RIBS AND CHEST - 3+ VIEW COMPARISON:  On 04/23/2013 FINDINGS: Remote left posterolateral sixth rib fracture. Cardiomegaly with moderate to marked aortic atherosclerosis with uncoiling of  the thoracic aorta. No aneurysm. Atelectasis is identified at the left lung base. No pneumothorax or pulmonary contusions. Lumbar spinal fusion L3 through L5 with interbody blocks in place. Chronic L2 compression deformity with moderate 50% height loss. Inferior endplate compression of T8 with 50% height loss is also noted. These appear chronic and stable. IMPRESSION: 1. Remote left sixth rib fracture. No acute displaced rib fracture is currently noted. 2. Cardiomegaly with aortic atherosclerosis. 3. Thoracolumbar spondylosis with lower lumbar spinal fusion and chronic compression fractures of T8 and L2. Electronically Signed   By: Ashley Royalty M.D.   On: 04/23/2018 00:21   Dg Hip Unilat W Or Wo Pelvis 2-3 Views Left  Result Date: 04/23/2018 CLINICAL DATA:  Left hip pain after fall. EXAM: DG HIP (WITH OR WITHOUT PELVIS) 2-3V LEFT COMPARISON:  None. FINDINGS: An acute, varus angulated and slightly impacted left transcervical femoral neck fracture is identified. Lower lumbar spinal fusion hardware appears intact. The bony pelvis and pubic rami are intact. No acetabular fracture is noted. Partially included uncemented right bipolar hip arthroplasty is in place without complicating features. Dense bi-iliofemoral atherosclerosis. IMPRESSION: Acute varus angulated transcervical fracture of the left femur with mild impaction of the femoral neck upon the opposing femoral neck. Electronically Signed   By: Ashley Royalty M.D.   On: 04/23/2018 00:26    All pertinent xrays, MRI, CT independently reviewed and interpreted  Positive ROS: All other systems have been reviewed and were otherwise negative with the exception of those mentioned in the HPI and as above.  Physical Exam: General: Alert, no acute  distress Cardiovascular: No pedal edema Respiratory: No cyanosis, no use of accessory musculature GI: No organomegaly, abdomen is soft and non-tender Skin: No lesions in the area of chief complaint Neurologic: Sensation intact distally Psychiatric: Patient has advanced dementia Lymphatic: No axillary or cervical lymphadenopathy  MUSCULOSKELETAL:  - pain with movement of the hip and extremity - skin intact - NVI distally - compartments soft  Assessment: Left femoral neck fracture  Plan: - Hemiarthroplasty is recommended, family are aware of r/b/a and wish to proceed - consent obtained - medical optimization per primary team - surgery is planned for later this morning  Thank you for the consult and the opportunity to see Ms. Dymek  N. Eduard Roux, MD [facility] [phone] 8:19 AM

## 2018-04-24 ENCOUNTER — Inpatient Hospital Stay (HOSPITAL_COMMUNITY): Payer: Medicare Other

## 2018-04-24 DIAGNOSIS — N179 Acute kidney failure, unspecified: Secondary | ICD-10-CM

## 2018-04-24 DIAGNOSIS — N3000 Acute cystitis without hematuria: Secondary | ICD-10-CM

## 2018-04-24 LAB — COMPREHENSIVE METABOLIC PANEL
ALT: 9 U/L (ref 0–44)
ALT: 7 U/L (ref 0–44)
ANION GAP: 12 (ref 5–15)
AST: 23 U/L (ref 15–41)
AST: 26 U/L (ref 15–41)
Albumin: 2.5 g/dL — ABNORMAL LOW (ref 3.5–5.0)
Albumin: 2.4 g/dL — ABNORMAL LOW (ref 3.5–5.0)
Alkaline Phosphatase: 42 U/L (ref 38–126)
Alkaline Phosphatase: 43 U/L (ref 38–126)
Anion gap: 8 (ref 5–15)
BILIRUBIN TOTAL: 0.4 mg/dL (ref 0.3–1.2)
BILIRUBIN TOTAL: 0.5 mg/dL (ref 0.3–1.2)
BUN: 29 mg/dL — AB (ref 8–23)
BUN: 31 mg/dL — AB (ref 8–23)
CO2: 25 mmol/L (ref 22–32)
CO2: 20 mmol/L — ABNORMAL LOW (ref 22–32)
CREATININE: 1.79 mg/dL — AB (ref 0.44–1.00)
Calcium: 7.9 mg/dL — ABNORMAL LOW (ref 8.9–10.3)
Calcium: 7.5 mg/dL — ABNORMAL LOW (ref 8.9–10.3)
Chloride: 102 mmol/L (ref 98–111)
Chloride: 100 mmol/L (ref 98–111)
Creatinine, Ser: 1.97 mg/dL — ABNORMAL HIGH (ref 0.44–1.00)
GFR calc Af Amer: 30 mL/min — ABNORMAL LOW (ref >60)
GFR calc Af Amer: 26 mL/min — ABNORMAL LOW (ref >60)
GFR, EST NON AFRICAN AMERICAN: 26 mL/min — AB (ref >60)
GFR, EST NON AFRICAN AMERICAN: 23 mL/min — AB (ref >60)
Glucose, Bld: 103 mg/dL — ABNORMAL HIGH (ref 70–99)
Glucose, Bld: 136 mg/dL — ABNORMAL HIGH (ref 70–99)
POTASSIUM: 4.2 mmol/L (ref 3.5–5.1)
Potassium: 4 mmol/L (ref 3.5–5.1)
Sodium: 135 mmol/L (ref 135–145)
Sodium: 132 mmol/L — ABNORMAL LOW (ref 135–145)
TOTAL PROTEIN: 5 g/dL — AB (ref 6.5–8.1)
Total Protein: 4.9 g/dL — ABNORMAL LOW (ref 6.5–8.1)

## 2018-04-24 LAB — URINALYSIS, ROUTINE W REFLEX MICROSCOPIC
BILIRUBIN URINE: NEGATIVE
Bacteria, UA: NONE SEEN
GLUCOSE, UA: NEGATIVE mg/dL
Ketones, ur: NEGATIVE mg/dL
NITRITE: NEGATIVE
Protein, ur: 100 mg/dL — AB
SPECIFIC GRAVITY, URINE: 1.027 (ref 1.005–1.030)
pH: 5 (ref 5.0–8.0)

## 2018-04-24 LAB — CBC
HEMATOCRIT: 32.1 % — AB (ref 36.0–46.0)
Hemoglobin: 10 g/dL — ABNORMAL LOW (ref 12.0–15.0)
MCH: 30.7 pg (ref 26.0–34.0)
MCHC: 31.2 g/dL (ref 30.0–36.0)
MCV: 98.5 fL (ref 78.0–100.0)
Platelets: 136 10*3/uL — ABNORMAL LOW (ref 150–400)
RBC: 3.26 MIL/uL — AB (ref 3.87–5.11)
RDW: 14 % (ref 11.5–15.5)
WBC: 12.4 10*3/uL — AB (ref 4.0–10.5)

## 2018-04-24 LAB — GLUCOSE, CAPILLARY: GLUCOSE-CAPILLARY: 153 mg/dL — AB (ref 70–99)

## 2018-04-24 LAB — CBC WITH DIFFERENTIAL/PLATELET
Basophils Absolute: 0 10*3/uL (ref 0.0–0.1)
Basophils Relative: 0 %
Eosinophils Absolute: 0.2 10*3/uL (ref 0.0–0.7)
Eosinophils Relative: 1 %
HEMATOCRIT: 31.2 % — AB (ref 36.0–46.0)
Hemoglobin: 9.6 g/dL — ABNORMAL LOW (ref 12.0–15.0)
LYMPHS ABS: 1 10*3/uL (ref 0.7–4.0)
LYMPHS PCT: 6 %
MCH: 30.6 pg (ref 26.0–34.0)
MCHC: 30.8 g/dL (ref 30.0–36.0)
MCV: 99.4 fL (ref 78.0–100.0)
MONOS PCT: 10 %
Monocytes Absolute: 1.7 10*3/uL — ABNORMAL HIGH (ref 0.1–1.0)
Neutro Abs: 13.7 10*3/uL — ABNORMAL HIGH (ref 1.7–7.7)
Neutrophils Relative %: 83 %
PLATELETS: 131 10*3/uL — AB (ref 150–400)
RBC: 3.14 MIL/uL — AB (ref 3.87–5.11)
RDW: 14.2 % (ref 11.5–15.5)
WBC: 16.6 10*3/uL — AB (ref 4.0–10.5)

## 2018-04-24 LAB — TSH: TSH: 0.905 u[IU]/mL (ref 0.350–4.500)

## 2018-04-24 LAB — AMMONIA: Ammonia: 28 umol/L (ref 9–35)

## 2018-04-24 MED ORDER — SODIUM CHLORIDE 0.9 % IV SOLN
1.0000 g | INTRAVENOUS | Status: DC
  Administered 2018-04-24 – 2018-04-25 (×2): 1 g via INTRAVENOUS
  Filled 2018-04-24 (×2): qty 10

## 2018-04-24 MED ORDER — SODIUM CHLORIDE 0.9 % IV SOLN
1.0000 g | INTRAVENOUS | Status: DC
  Filled 2018-04-24: qty 10

## 2018-04-24 NOTE — Evaluation (Signed)
Physical Therapy Evaluation Patient Details Name: Katie Pace MRN: [id number] DOB: 1936/05/10 Today's Date: 04/24/2018   History of Present Illness  Adm 04/22/18 after fall at home (her walker became entangled with another piece of furniture) resulting in Lt hip fracture. 7/20 surgery with anterior approach bipolar prosthesis  PMH-dementia, HTN,  DM, CKD III, Rt TKR, Rt THR, AAA repair    Clinical Impression  Patient is s/p above surgery (with recent medical decline as noted in comments of this evaluation) resulting in functional limitations due to the deficits listed below (see PT Problem List). At end of evaluation, pt being transferred to step-down unit with further testing/assessment to follow.  Depending on medical status, patient may benefit from skilled PT to increase their independence and safety with mobility to allow discharge to the venue listed below.       Follow Up Recommendations SNF;Supervision/Assistance - 24 hour    Equipment Recommendations  None recommended by PT    Recommendations for Other Services Other (comment)(Neuro consult)     Precautions / Restrictions Precautions Precautions: Fall Restrictions Weight Bearing Restrictions: Yes LLE Weight Bearing: Weight bearing as tolerated      Mobility  Bed Mobility Overal bed mobility: Needs Assistance Bed Mobility: Rolling Rolling: Total assist;+2 for physical assistance         General bed mobility comments: Pt's bed pad up around her back and needed to reposition in the bed while awaiting RN; pt with eyes open but not assisting with roll either direction  Transfers                 General transfer comment: unable to assess  Ambulation/Gait                Stairs            Wheelchair Mobility    Modified Rankin (Stroke Patients Only)       Balance Overall balance assessment: (unable to assess due to decr medical status)                                           Pertinent Vitals/Pain Pain Assessment: Faces Faces Pain Scale: Hurts even more(0 at rest; 6 when moving LLE) Pain Location: LLE Pain Descriptors / Indicators: Grimacing Pain Intervention(s): Limited activity within patient's tolerance;Repositioned    Home Living Family/patient expects to be discharged to:: Private residence Living Arrangements: Spouse/significant other               Additional Comments: per chart    Prior Function           Comments: unknown; was walking with RW when she fell     Hand Dominance        Extremity/Trunk Assessment   Upper Extremity Assessment Upper Extremity Assessment: Defer to OT evaluation    Lower Extremity Assessment Lower Extremity Assessment: RLE deficits/detail;LLE deficits/detail;Difficult to assess due to impaired cognition RLE Deficits / Details: PROM with attempts to arouse pt and attempt neuro screen (pt slightly resisting and grimace). PROM WFL RLE Sensation: (withdrew to pain) LLE Deficits / Details: PROM with attempts to arouse pt and attempt neuro screen (pt slightly resisted and grimace) PROM WFL LLE Sensation: (not withdrawing to nail bed pressure)    Cervical / Trunk Assessment Cervical / Trunk Assessment: Other exceptions(slightly lean rt on arrival (HOB ~45); head turned rt) Cervical / Trunk Exceptions: neck  very stiff with difficulty repositioning her head to left of neutral, however noted her eyes would not track past neutral and RN called for his input/to assess patient  Communication   Communication: Receptive difficulties;Expressive difficulties  Cognition Arousal/Alertness: Lethargic   Overall Cognitive Status: No family/caregiver present to determine baseline cognitive functioning(brother present; states he has not seen in awhile; can't say)                                        General Comments General comments (skin integrity, edema, etc.): Brother and sister-in-law  present on evaluation and pt leaning slightly to right with eyes open on arrival. Brother unable to provide insight into pt's baseline cognition (hasn't seen her in some time); noted h/o dementia. Initiated PT/OT assessments to attempt to arouse patient and get a respone due to ? oversedation from combination of anesthesia, pain meds. Patient would grimace and state "ouch" with PROM of bil LEs, however then noted she would not turn her head to midline or respond to loud noise coming from her left. When assisted to turn her head 10-15 degrees to her left, noted her eyes would not cross midline. RN called to room and updated on our findings. Vital signs taken HR65, SaO2 95% on room air; BP 127/38 (LUE) with repetition 127/40. By then MD arrived and care given over to RN and MD. Due to ?change in medical status    Exercises     Assessment/Plan    PT Assessment Patient needs continued PT services(if remains medically appropriate)  PT Problem List Decreased strength;Decreased activity tolerance;Decreased cognition;Decreased knowledge of use of DME;Pain       PT Treatment Interventions DME instruction;Gait training;Functional mobility training;Therapeutic activities;Therapeutic exercise;Balance training;Neuromuscular re-education;Cognitive remediation;Patient/family education    PT Goals (Current goals can be found in the Care Plan section)  Acute Rehab PT Goals Patient Stated Goal: unalbe  PT Goal Formulation: Patient unable to participate in goal setting Time For Goal Achievement: 05/08/18 Potential to Achieve Goals: Fair    Frequency Min 5X/week   Barriers to discharge        Co-evaluation PT/OT/SLP Co-Evaluation/Treatment: Yes Reason for Co-Treatment: Necessary to address cognition/behavior during functional activity;Complexity of the patient's impairments (multi-system involvement) PT goals addressed during session: Mobility/safety with mobility         AM-PAC PT "6 Clicks" Daily  Activity  Outcome Measure Difficulty turning over in bed (including adjusting bedclothes, sheets and blankets)?: Unable Difficulty moving from lying on back to sitting on the side of the bed? : Unable Difficulty sitting down on and standing up from a chair with arms (e.g., wheelchair, bedside commode, etc,.)?: Unable Help needed moving to and from a bed to chair (including a wheelchair)?: Total Help needed walking in hospital room?: Total Help needed climbing 3-5 steps with a railing? : Total 6 Click Score: 6    End of Session   Activity Tolerance: Treatment limited secondary to medical complications (Comment)(see general comments) Patient left: in bed;with nursing/sitter in room(HOB 0 after DBP noted to be low; RN and MD present) Nurse Communication: Other (comment)(?decline in status with RN in to confirm) PT Visit Diagnosis: Repeated falls (R29.6)    Time: 6295-2841 PT Time Calculation (min) (ACUTE ONLY): 26 min   Charges:   PT Evaluation $PT Eval Low Complexity: 1 Low     PT G Codes:  Scherrie November Chancey Ringel, PT 04/24/2018, 11:42 AM

## 2018-04-24 NOTE — Progress Notes (Signed)
[facility]                                                                              Patient Demographics  Katie Pace, is a 82 y.o. female, DOB - July 09, 1936, [place] date - 04/22/2018   Admitting Physician Katie Darleen Crocker, DO  Outpatient Primary MD for the patient is Katie Squibb, MD  Outpatient specialists:   LOS - 1  days    Chief Complaint  Patient presents with  . Fall       Brief summary   Katie Pace is a 82 y.o. female  with medical history significant for but not limited to advanced dementia, diabetes and CKD presenting with left hip fracture  Secondary to mechanical fall after her walker was caught in a rug.  She was admitted and planned for left hip hemiarthroplasty by orthopedist Dr. 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 Acute left hip fracture:   s/p left hemiarthroplasty 04/23/2018   supportive care PT/OT   2. Altered mental status: Lethargic today Check head CT, consider MRI as needed Follow-up Ucx, TSH, ammonia level Switch abx to rocephin - 1 gm ordered, pharmacy to dose subsequently - consulted already, dose adjusted to renal fxn Transfer to stepdown bed for closer monitoring  2. AKI on CKD stage III versus CKD progression:   Maintain on gentle IV fluid and avoid nephrotoxic agents.     Monitor renal function with electrolytes Out-patient f/u with nephrology   3.Sheehan syndrome:  Maintain on oral hydrocortisone.    4. Type 2 diabetes :   Diet-controlled.   Consider SSI as needed.  5. UTI: Abx      Code Status: full DVT Prophylaxis:  heparin  Family Communication:   Disposition Plan: SNF  Time Spent in minutes 30  minutes  Procedures:   left hip anterior hemi arthropl  Consultants:   Dr Katie Pace  Antimicrobials:      Medications  Scheduled Meds: . acetaminophen  500 mg Oral Q6H  . amitriptyline  25 mg Oral QHS  . atenolol  100 mg Oral Daily  . cholecalciferol  3,000 Units Oral Daily  . diltiazem  240 mg Oral Daily  . dipyridamole-aspirin  1 capsule Oral BID  . docusate sodium  100 mg Oral BID  . hydrocortisone  20 mg Oral Q breakfast   And  . hydrocortisone  10 mg Oral QAC supper  . levothyroxine  75 mcg Oral QAC breakfast  . memantine  10 mg Oral BID  . pantoprazole  40 mg Oral Daily  . rosuvastatin  5 mg Oral QHS  . tranexamic acid (CYKLOKAPRON) topical -INTRAOP  2,000 mg Topical Once   Continuous Infusions: . sodium chloride 75 mL/hr at 04/24/18 0916  . lactated ringers 10 mL/hr at 04/23/18 1134  . methocarbamol (  ROBAXIN)  IV     PRN Meds:.acetaminophen **OR** acetaminophen, acetaminophen, alum & mag hydroxide-simeth, fentaNYL (SUBLIMAZE) injection, hydrALAZINE, HYDROcodone-acetaminophen, HYDROcodone-acetaminophen, magnesium citrate, menthol-cetylpyridinium **OR** phenol, methocarbamol **OR** methocarbamol (ROBAXIN)  IV, morphine injection, ondansetron **OR** ondansetron (ZOFRAN) IV, ondansetron **OR** ondansetron (ZOFRAN) IV, polyethylene glycol, sorbitol   Antibiotics   Anti-infectives (From admission, onward)   Start     Dose/Rate Route Frequency Ordered Stop   04/23/18 1645  ceFAZolin (ANCEF) IVPB 2g/100 mL premix     2 g 200 mL/hr over 30 Minutes Intravenous Every 6 hours 04/23/18 1631 04/24/18 0503   04/23/18 1405  vancomycin (VANCOCIN) powder  Status:  Discontinued       As needed 04/23/18 1415 04/23/18 1448   04/23/18 1118  ceFAZolin (ANCEF) 2-4 GM/100ML-% IVPB    Note to Pharmacy:  [name]   : cabinet override      04/23/18 1118 04/23/18 1243   04/23/18 1115  ceFAZolin (ANCEF) IVPB 2g/100 mL premix     2 g 200 mL/hr over 30 Minutes Intravenous On call to O.R.  04/23/18 1114 04/23/18 1313   04/23/18 0045  aztreonam (AZACTAM) 1 g in sodium chloride 0.9 % 100 mL IVPB     1 g 200 mL/hr over 30 Minutes Intravenous  Once 04/23/18 0037 04/23/18 0222        Subjective:   Katie Pace was seen and examined today.   She was noted to be alert and was able to take her pills earlier this morning, however, subsequently patient was noted to be awake but unresponsive  Objective:   Vitals:   04/23/18 2006 04/24/18 0019 04/24/18 0419 04/24/18 0434  BP: (!) 148/48 (!) 155/60 137/60   Pulse: (!) 56 61 (!) 59   Resp: 16 18    Temp: 98 F (36.7 C) 98.3 F (36.8 C) 98.3 F (36.8 C)   TempSrc: Oral Oral    SpO2: 95% 97% (!) 89% 96%  Weight:      Height:        Intake/Output Summary (Last 24 hours) at 04/24/2018 3267 Last data filed at 04/24/2018 1245 Gross per 24 hour  Intake 2545.72 ml  Output 1350 ml  Net 1195.72 ml     Wt Readings from Last 3 Encounters:  04/23/18 69.2 kg (152 lb 8.9 oz)  01/10/18 71.8 kg (158 lb 3.2 oz)  10/30/16 64 kg (141 lb)     Exam  General: NAD  HEENT: NCAT,  PERRL,MMM  Neck: SUPPLE, (-) JVD  Cardiovascular: RRR, (-) GALLOP, (-) MURMUR  Respiratory: CTA  Gastrointestinal: SOFT, (-) DISTENSION, BS(+), (_) TENDERNESS  Ext: (-) CYANOSIS, (-) EDEMA  Neuro: awake, but doesn't follow comments, moves purposefully on deep sternal rub moaning, no facial asymmetry no obvious focal deficit noted  Skin:(-) RASH  Psych:NORMAL AFFECT/MOOD   Data Reviewed:  I have personally reviewed following labs and imaging studies  Micro Results Recent Results (from the past 240 hour(s))  Urine culture     Status: Abnormal (Preliminary result)   Collection Time: 04/23/18 12:36 AM  Result Value Ref Range Status   Specimen Description   Final    URINE, CLEAN CATCH Performed at [facility], [address], [place], [place] [address]    Special Requests   Final    Normal Performed at [facility], [address],  [place], [place] [address]    Culture >=100,000 COLONIES/mL GRAM NEGATIVE RODS (A)  Final   Report Status PENDING  Incomplete  Surgical pcr screen  Status: Abnormal   Collection Time: 04/23/18  6:57 AM  Result Value Ref Range Status   MRSA, PCR NEGATIVE NEGATIVE Final   Staphylococcus aureus POSITIVE (A) NEGATIVE Final    Comment: (NOTE) The Xpert SA Assay (FDA approved for NASAL specimens in patients 53 years of age and older), is one component of a comprehensive surveillance program. It is not intended to diagnose infection nor to guide or monitor treatment.     Radiology Reports Dg Ribs Unilateral W/chest Left  Result Date: 04/23/2018 CLINICAL DATA:  Left axillary pain after fall. EXAM: LEFT RIBS AND CHEST - 3+ VIEW COMPARISON:  On 04/23/2013 FINDINGS: Remote left posterolateral sixth rib fracture. Cardiomegaly with moderate to marked aortic atherosclerosis with uncoiling of the thoracic aorta. No aneurysm. Atelectasis is identified at the left lung base. No pneumothorax or pulmonary contusions. Lumbar spinal fusion L3 through L5 with interbody blocks in place. Chronic L2 compression deformity with moderate 50% height loss. Inferior endplate compression of T8 with 50% height loss is also noted. These appear chronic and stable. IMPRESSION: 1. Remote left sixth rib fracture. No acute displaced rib fracture is currently noted. 2. Cardiomegaly with aortic atherosclerosis. 3. Thoracolumbar spondylosis with lower lumbar spinal fusion and chronic compression fractures of T8 and L2. Electronically Signed   By: Ashley Royalty M.D.   On: 04/23/2018 00:21   Pelvis Portable  Result Date: 04/23/2018 CLINICAL DATA:  Anterior left hip hemiarthroplasty. EXAM: PORTABLE PELVIS 1-2 VIEWS COMPARISON:  04/22/2018 FINDINGS: Right hip arthroplasty unchanged. Interval placement of left hip arthroplasty intact and normally located. Mild diffuse osteopenia. Remainder of the exam is unchanged. IMPRESSION: Interval left  hip arthroplasty intact. Electronically Signed   By: Marin Olp M.D.   On: 04/23/2018 16:27   Dg C-arm 1-60 Min  Result Date: 04/23/2018 CLINICAL DATA:  Left hip arthroplasty. EXAM: OPERATIVE left HIP (WITH PELVIS IF PERFORMED)  VIEWS TECHNIQUE: Fluoroscopic spot image(s) were submitted for interpretation post-operatively. COMPARISON:  04/22/2018 FINDINGS: There has been interval placement of a left hip arthroplasty in adequate position normally located. Partially visualized right hip arthroplasty is present. Remainder the exam is unchanged. IMPRESSION: Placement of left hip arthroplasty intact. Electronically Signed   By: Marin Olp M.D.   On: 04/23/2018 15:01   Dg Hip Operative Unilat W Or W/o Pelvis Left  Result Date: 04/23/2018 CLINICAL DATA:  Left hip arthroplasty. EXAM: OPERATIVE left HIP (WITH PELVIS IF PERFORMED)  VIEWS TECHNIQUE: Fluoroscopic spot image(s) were submitted for interpretation post-operatively. COMPARISON:  04/22/2018 FINDINGS: There has been interval placement of a left hip arthroplasty in adequate position normally located. Partially visualized right hip arthroplasty is present. Remainder the exam is unchanged. IMPRESSION: Placement of left hip arthroplasty intact. Electronically Signed   By: Marin Olp M.D.   On: 04/23/2018 15:01   Dg Hip Unilat W Or Wo Pelvis 2-3 Views Left  Result Date: 04/23/2018 CLINICAL DATA:  Left hip pain after fall. EXAM: DG HIP (WITH OR WITHOUT PELVIS) 2-3V LEFT COMPARISON:  None. FINDINGS: An acute, varus angulated and slightly impacted left transcervical femoral neck fracture is identified. Lower lumbar spinal fusion hardware appears intact. The bony pelvis and pubic rami are intact. No acetabular fracture is noted. Partially included uncemented right bipolar hip arthroplasty is in place without complicating features. Dense bi-iliofemoral atherosclerosis. IMPRESSION: Acute varus angulated transcervical fracture of the left femur with mild  impaction of the femoral neck upon the opposing femoral neck. Electronically Signed   By: Ashley Royalty M.D.   On:  04/23/2018 00:26    Lab Data:  CBC: Recent Labs  Lab 04/22/18 2313 04/24/18 0543  WBC 9.8 12.4*  NEUTROABS 7.3  --   HGB 12.7 10.0*  HCT 40.6 32.1*  MCV 98.8 98.5  PLT 178 076*   Basic Metabolic Panel: Recent Labs  Lab 04/22/18 2313 04/24/18 0543  NA 136 135  K 4.7 4.0  CL 103 102  CO2 24 25  GLUCOSE 139* 103*  BUN 45* 29*  CREATININE 1.77* 1.79*  CALCIUM 8.7* 7.9*   GFR: Estimated Creatinine Clearance: 23.9 mL/min (A) (by C-G formula based on SCr of 1.79 mg/dL (H)). Liver Function Tests: Recent Labs  Lab 04/22/18 2313 04/24/18 0543  AST 19 23  ALT 13 9  ALKPHOS 55 42  BILITOT 0.6 0.4  PROT 6.8 5.0*  ALBUMIN 3.6 2.5*   No results for input(s): LIPASE, AMYLASE in the last 168 hours. No results for input(s): AMMONIA in the last 168 hours. Coagulation Profile: Recent Labs  Lab 04/22/18 2313  INR 0.93   Cardiac Enzymes: No results for input(s): CKTOTAL, CKMB, CKMBINDEX, TROPONINI in the last 168 hours. BNP (last 3 results) No results for input(s): PROBNP in the last 8760 hours. HbA1C: No results for input(s): HGBA1C in the last 72 hours. CBG: Recent Labs  Lab 04/23/18 1128  GLUCAP 119*   Lipid Profile: No results for input(s): CHOL, HDL, LDLCALC, TRIG, CHOLHDL, LDLDIRECT in the last 72 hours. Thyroid Function Tests: No results for input(s): TSH, T4TOTAL, FREET4, T3FREE, THYROIDAB in the last 72 hours. Anemia Panel: No results for input(s): VITAMINB12, FOLATE, FERRITIN, TIBC, IRON, RETICCTPCT in the last 72 hours. Urine analysis:    Component Value Date/Time   COLORURINE YELLOW 04/22/2018 2359   APPEARANCEUR HAZY (A) 04/22/2018 2359   LABSPEC 1.013 04/22/2018 2359   PHURINE 6.0 04/22/2018 2359   GLUCOSEU NEGATIVE 04/22/2018 2359   HGBUR NEGATIVE 04/22/2018 2359   BILIRUBINUR NEGATIVE 04/22/2018 2359   KETONESUR NEGATIVE 04/22/2018  2359   PROTEINUR 30 (A) 04/22/2018 2359   UROBILINOGEN 0.2 04/01/2012 0510   NITRITE POSITIVE (A) 04/22/2018 2359   LEUKOCYTESUR SMALL (A) 04/22/2018 2359     Benito Mccreedy M.D. [facility] 04/24/2018, 9:42 AM  Pager: [phone] Between 7am to 7pm - call Pager - [phone]  After 7pm go to [email or web address] - password [email or web address]  Call night coverage person covering after 7pm

## 2018-04-24 NOTE — Progress Notes (Signed)
   Subjective:  Patient reports pain as mild.  No events.  Objective:   VITALS:   Vitals:   04/23/18 2006 04/24/18 0019 04/24/18 0419 04/24/18 0434  BP: (!) 148/48 (!) 155/60 137/60   Pulse: (!) 56 61 (!) 59   Resp: 16 18    Temp: 98 F (36.7 C) 98.3 F (36.8 C) 98.3 F (36.8 C)   TempSrc: Oral Oral    SpO2: 95% 97% (!) 89% 96%  Weight:      Height:        Neurovascular intact Sensation intact distally Intact pulses distally Dorsiflexion/Plantar flexion intact Incision: dressing C/D/I and no drainage No cellulitis present Compartment soft   Lab Results  Component Value Date   WBC 12.4 (H) 04/24/2018   HGB 10.0 (L) 04/24/2018   HCT 32.1 (L) 04/24/2018   MCV 98.5 04/24/2018   PLT 136 (L) 04/24/2018     Assessment/Plan:  1 Day Post-Op   - Expected postop acute blood loss anemia - will monitor for symptoms - Up with PT/OT - DVT ppx - SCDs, ambulation, lovenox - WBAT operative extremity - no hip precautions - Pain control - Discharge planning per primary team  Eduard Roux 04/24/2018, 10:45 AM [phone]

## 2018-04-24 NOTE — Progress Notes (Signed)
Patient alert and verbal. Oriented to person, disoriented to place, time and situation. . Able to follow simple commands. Patient  transferred to 4N11. Report called in to [facility].

## 2018-04-24 NOTE — Plan of Care (Signed)

## 2018-04-24 NOTE — Progress Notes (Signed)
Approx 1040 patient working with PT. Patient alert but not responding to commands. Responds to painful stimuli. No facial drooping noted. VS 124/41, P64, R16 O2 sat 96% on 2lpm via nasal cannula. Pupils equal and reactive to light.   MD in unit and was notified.

## 2018-04-24 NOTE — Evaluation (Signed)
Occupational Therapy Evaluation Patient Details Name: Katie Pace MRN: [id number] DOB: 06/11/1936 Today's Date: 04/24/2018    History of Present Illness Adm 04/22/18 after fall at home (her walker became entangled with another piece of furniture) resulting in Lt hip fracture. 7/20 surgery with anterior approach bipolar prosthesis  PMH-dementia, HTN,  DM, CKD III, Rt TKR, Rt THR, AAA repair   Clinical Impression   Pt presenting to OT evaluation highly lethargic today but opening her eyes to her name. Noted decreased responsivity, decreased ability to follow commands, focused attention, and inability to track past midline to the L. Noted R gaze with head turned and unable to attend to L visually initially without multi-modal cues. Pt additionally with minimal withdrawal to pain at L toes although grimacing with L LE PROM provided by PT. Pt unable to squeeze therapist's fingers on command as well. Called RN to room due to these findings and he quickly arrived to assess. Vital signs as follows: BP 127/38 and 127/40 (repeated), SpO2 95% on RA, HR 65 bpm. RN notified MD who came to room to assess pt and reporting that pt will be transferred to step-down.   At current functional level, pt requiring overall total assistance for all ADL participation. Attempted to facilitate improved arousal with cool wash cloth placed on forehead but pt did not initiate hand to face movement to address this. Pt does demonstrate functional PROM BUE although resists forward elevation at times. Depending on progress, feel that pt will need SNF level rehabilitation post-acute D/C to maximize independence and safety with ADL and functional mobility prior to returning home. Will continue to follow as appropriate.    Follow Up Recommendations  SNF;Supervision/Assistance - 24 hour    Equipment Recommendations  Other (comment)(TBD depending on progress)    Recommendations for Other Services       Precautions /  Restrictions Precautions Precautions: Fall Restrictions Weight Bearing Restrictions: Yes LLE Weight Bearing: Weight bearing as tolerated      Mobility Bed Mobility Overal bed mobility: Needs Assistance Bed Mobility: Rolling Rolling: Total assist;+2 for physical assistance         General bed mobility comments: Attempted to roll pt to reposition bed pad while awaiting RN and pt requiring total assistance for all repositioning.   Transfers                 General transfer comment: unsafe to assess    Balance Overall balance assessment: (unable to assess due to decr medical status)                                         ADL either performed or assessed with clinical judgement   ADL Overall ADL's : Needs assistance/impaired                                       General ADL Comments: Requiring total assistance for all ADL at this time. Attempted to arouse pt with cool washcloth with no response when this was placed on her forehead.      Vision   Vision Assessment?: Yes Eye Alignment: Within Functional Limits Ocular Range of Motion: (minimal occular movement) Alignment/Gaze Preference: Head turned;Gaze right Tracking/Visual Pursuits: Other (comment)(tracking only to midline from the R; did cross midline to RN) Additional Comments: Pt with noted  R gaze and head turn this session on arrival. Unable to facilitate tracking this session. She did bring eyes to midline when PT assisting with cervical ROM. Concerned for neurological status and called RN to room. He in turn assessed and called MD to room. Pt does not blink to threat on the L.      Perception     Praxis      Pertinent Vitals/Pain Pain Assessment: Faces Faces Pain Scale: Hurts even more Pain Location: When moving LLE; no grimacing at rest Pain Descriptors / Indicators: Grimacing Pain Intervention(s): Limited activity within patient's tolerance;Monitored during  session;Repositioned     Hand Dominance     Extremity/Trunk Assessment Upper Extremity Assessment Upper Extremity Assessment: RUE deficits/detail;LUE deficits/detail RUE Deficits / Details: Pt did withdraw to noxious stimulus. Pt with some resistance to shoulder PROM above 90 degrees. PROM elbow/wrist/hand WFL. No active movement noted.  LUE Deficits / Details: Pt does withdraw to noxious stimulus. She did grasp RN hand but does not squeeze OT hand on command. Slight resistance to shoulder PROM in flexion greater than 100 degrees. PROM WNL elbow/wrist/hand.    Lower Extremity Assessment Lower Extremity Assessment: Defer to PT evaluation RLE Deficits / Details: PROM with attempts to arouse pt and attempt neuro screen (pt slightly resisting and grimace). PROM WFL RLE Sensation: (withdrew to pain) LLE Deficits / Details: PROM with attempts to arouse pt and attempt neuro screen (pt slightly resisted and grimace) PROM WFL LLE Sensation: (not withdrawing to nail bed pressure)   Cervical / Trunk Assessment Cervical / Trunk Assessment: Other exceptions Cervical / Trunk Exceptions: Pt with stiffness in neck. Pt with preference for R cervical rotation as well as R gaze preference.    Communication Communication Communication: Receptive difficulties;Expressive difficulties   Cognition Arousal/Alertness: Lethargic Behavior During Therapy: (responding only to pain and her name) Overall Cognitive Status: Impaired/Different from baseline Area of Impairment: Following commands;Attention                   Current Attention Level: Focused   Following Commands: (did not follow commands)       General Comments: Pt's brother present but has not seen pt lately and unable to state. Per RN, pt was talking and responsive earlier yesterday and this morning. Pt with inability to follow majority of commands today. She did squeeze RN Greggory Stallion) hand but not OT. She was unable to track to the L despite  multimodal stimulation. Pt opening eyes to her name and demonstrating focused attention only.     General Comments  Pt's brother and sister-in-law present on arrival. Pt noted with R lateral lean (in bed with HOB elevated). Brother unable to provide information concerning pt's baseline cognition. History of dementia per chart. Pt unable to to track visually toward the L and unable to cross midline with eyes despite multimodal stimulation. Pt grimacing with PROM BLE by PT. She did not withdraw to noxious stimuli L large toe. Thus, called RN to room. RN assessing and notified MD who quickly came to attend to pt. VS: HR 65, SpO2 95% on RA, BP 127/38 (L UE) and 127/40 on repeated measurement.     Exercises     Shoulder Instructions      Home Living Family/patient expects to be discharged to:: Private residence Living Arrangements: Spouse/significant other  Additional Comments: per chart; pt unable to respond and pt's brother unsure       Prior Functioning/Environment          Comments: Pt unable to report. Pt's brother present and unaware of PLOF. Pt was ambulating with RW when she fell.        OT Problem List: Decreased strength;Decreased range of motion;Decreased activity tolerance;Impaired balance (sitting and/or standing);Impaired vision/perception;Decreased coordination;Decreased cognition;Decreased safety awareness;Decreased knowledge of use of DME or AE;Decreased knowledge of precautions;Impaired UE functional use      OT Treatment/Interventions: Self-care/ADL training;Therapeutic exercise;DME and/or AE instruction;Therapeutic activities;Patient/family education;Balance training;Cognitive remediation/compensation;Visual/perceptual remediation/compensation;Neuromuscular education;Energy conservation;Manual therapy;Splinting;Modalities    OT Goals(Current goals can be found in the care plan section) Acute Rehab OT Goals Patient Stated Goal:  unable to state OT Goal Formulation: Patient unable to participate in goal setting Time For Goal Achievement: 05/08/18 Potential to Achieve Goals: Good ADL Goals Pt Will Perform Grooming: with min assist;sitting Additional ADL Goal #1: Pt will complete bed mobility in preparation for ADL participation with overall min assist. Additional ADL Goal #2: Pt will sustain attention to grooming tasks in a minimally distracting environment. Additional ADL Goal #3: Pt will attend to L side of visual field during functional ADL participation.  OT Frequency: Min 2X/week   Barriers to D/C:            Co-evaluation PT/OT/SLP Co-Evaluation/Treatment: Yes Reason for Co-Treatment: Necessary to address cognition/behavior during functional activity;Complexity of the patient's impairments (multi-system involvement) PT goals addressed during session: Mobility/safety with mobility OT goals addressed during session: Strengthening/ROM;ADL's and self-care      AM-PAC PT "6 Clicks" Daily Activity     Outcome Measure Help from another person eating meals?: Total Help from another person taking care of personal grooming?: Total Help from another person toileting, which includes using toliet, bedpan, or urinal?: Total Help from another person bathing (including washing, rinsing, drying)?: Total Help from another person to put on and taking off regular upper body clothing?: Total Help from another person to put on and taking off regular lower body clothing?: Total 6 Click Score: 6   End of Session Nurse Communication: Mobility status;Other (comment)(medical status)  Activity Tolerance: Patient tolerated treatment well Patient left: in bed;Other (comment);with nursing/sitter in room(RN, MD in room; HOB flat)  OT Visit Diagnosis: Other abnormalities of gait and mobility (R26.89);Other symptoms and signs involving cognitive function;Pain Pain - Right/Left: Left Pain - part of body: Hip                Time:  6010-9323 OT Time Calculation (min): 26 min Charges:  OT General Charges $OT Visit: 1 Visit OT Evaluation $OT Eval Low Complexity: 1 Low G-Codes:     Doristine Section, MS OTR/L  Pager: [phone]   Keelee Yankey A Sheranda Seabrooks 04/24/2018, 1:29 PM

## 2018-04-24 NOTE — Progress Notes (Signed)
Pt reassessed, alert and able to follow simple commands. Patient able to stick her tongue out and  squeeze writers fingers.  Responded to name but she is nonverbal. Opens eyes when spoken to.    Hand grip weak bil. Pupils equal and reactive to light. No facial drooping noted. Family at bedside.

## 2018-04-25 ENCOUNTER — Encounter (HOSPITAL_COMMUNITY): Payer: Self-pay | Admitting: Orthopaedic Surgery

## 2018-04-25 LAB — CBC
HCT: 30.3 % — ABNORMAL LOW (ref 36.0–46.0)
Hemoglobin: 9.6 g/dL — ABNORMAL LOW (ref 12.0–15.0)
MCH: 30.8 pg (ref 26.0–34.0)
MCHC: 31.7 g/dL (ref 30.0–36.0)
MCV: 97.1 fL (ref 78.0–100.0)
Platelets: 131 10*3/uL — ABNORMAL LOW (ref 150–400)
RBC: 3.12 MIL/uL — ABNORMAL LOW (ref 3.87–5.11)
RDW: 14.1 % (ref 11.5–15.5)
WBC: 15.6 10*3/uL — AB (ref 4.0–10.5)

## 2018-04-25 LAB — COMPREHENSIVE METABOLIC PANEL
ALT: <5 U/L (ref 0–44)
ANION GAP: 10 (ref 5–15)
AST: 26 U/L (ref 15–41)
Albumin: 2.3 g/dL — ABNORMAL LOW (ref 3.5–5.0)
Alkaline Phosphatase: 43 U/L (ref 38–126)
BUN: 29 mg/dL — AB (ref 8–23)
CHLORIDE: 100 mmol/L (ref 98–111)
CO2: 23 mmol/L (ref 22–32)
Calcium: 7.8 mg/dL — ABNORMAL LOW (ref 8.9–10.3)
Creatinine, Ser: 1.88 mg/dL — ABNORMAL HIGH (ref 0.44–1.00)
GFR, EST AFRICAN AMERICAN: 28 mL/min — AB (ref >60)
GFR, EST NON AFRICAN AMERICAN: 24 mL/min — AB (ref >60)
Glucose, Bld: 126 mg/dL — ABNORMAL HIGH (ref 70–99)
POTASSIUM: 4 mmol/L (ref 3.5–5.1)
Sodium: 133 mmol/L — ABNORMAL LOW (ref 135–145)
Total Bilirubin: 0.4 mg/dL (ref 0.3–1.2)
Total Protein: 5.1 g/dL — ABNORMAL LOW (ref 6.5–8.1)

## 2018-04-25 LAB — URINE CULTURE
CULTURE: NO GROWTH
Culture: >=100000 — AB
Special Requests: NORMAL

## 2018-04-25 LAB — GLUCOSE, CAPILLARY: GLUCOSE-CAPILLARY: 126 mg/dL — AB (ref 70–99)

## 2018-04-25 MED ORDER — CHLORHEXIDINE GLUCONATE CLOTH 2 % EX PADS
6.0000 | MEDICATED_PAD | Freq: Every day | CUTANEOUS | Status: DC
  Administered 2018-04-25 – 2018-04-26 (×2): 6 via TOPICAL

## 2018-04-25 MED ORDER — SENNOSIDES-DOCUSATE SODIUM 8.6-50 MG PO TABS
2.0000 | ORAL_TABLET | Freq: Every day | ORAL | Status: DC
  Administered 2018-04-25: 2 via ORAL
  Filled 2018-04-25: qty 2

## 2018-04-25 MED ORDER — MUPIROCIN 2 % EX OINT: TOPICAL_OINTMENT | Freq: Two times a day (BID) | CUTANEOUS | Status: DC

## 2018-04-25 MED ORDER — MUPIROCIN 2 % EX OINT
1.0000 "application " | TOPICAL_OINTMENT | Freq: Two times a day (BID) | CUTANEOUS | Status: DC
  Administered 2018-04-25 – 2018-04-26 (×3): 1 via NASAL
  Filled 2018-04-25: qty 22

## 2018-04-25 MED ORDER — CEPHALEXIN 500 MG PO CAPS
500.0000 mg | ORAL_CAPSULE | Freq: Two times a day (BID) | ORAL | Status: DC
  Administered 2018-04-26: 500 mg via ORAL
  Filled 2018-04-25: qty 1

## 2018-04-25 NOTE — Progress Notes (Addendum)
Patient Demographics:    Katie Pace, is a 82 y.o. female, DOB - 02-Oct-1936, ZOX:[id number]  Admit date - 04/22/2018   Admitting Physician Pratik Darleen Crocker, DO  Outpatient Primary MD for the patient is [name], MD  LOS - 2  Chief Complaint  Patient presents with  . 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 Erlinda Hong on 04/23/18   Plan:-  1)Citrobacter Koseri  UTI--- treated with IV Rocephin last dose 04/25/2018, okay to switch to p.o. Keflex for additional 3 days of starting 05/15/9146  2)Toxic metabolic encephalopathy----secondary to Citrobacter UTI as above #1 superimposed on underlying advanced dementia, anticipate some improvement with treatment of UTI  3)Sheehan syndrome-hemodynamically stable, continue hydrocortisone  4)DM2-no recent A1c available, per report this is diet-controlled,  5)AKI----acute kidney injury VS CKD progression -    renal function not available for the last 5 years, 5 years ago patient has CKD stage III , admission creatinine was  1.77, creatinine peaked at 1.97, creatinine back down to 1.8 at this time,       ,  Avoid nephrotoxic  agents/dehydration/hypotension  6)Dementia--- at baseline patient has advanced dementia with significant cognitive deficits, patient does not even remember her kids,  does not recognize her grandkids either  7)PAD-stable, continue Crestor and Aggrenox  8)HTN-stable, continue atenolol and Cardizem  9)Lt Hip Fx-  left hip hemiarthroplasty by orthopedist Dr. Erlinda Hong on 04/23/18, WBAT...    Code Status : full    Disposition Plan  : SNF  Consults  :  ortho   DVT Prophylaxis  :    SCDs   Lab Results  Component Value Date   PLT 131 (L) 04/25/2018    Inpatient Medications  Scheduled Meds: . amitriptyline  25 mg Oral QHS  . atenolol  100 mg Oral Daily  . Chlorhexidine Gluconate Cloth  6 each Topical Daily  . cholecalciferol  3,000 Units Oral Daily  . diltiazem  240 mg Oral Daily  . dipyridamole-aspirin  1 capsule Oral BID  . docusate sodium  100 mg Oral BID  . hydrocortisone  20 mg Oral Q breakfast   And  . hydrocortisone  10 mg Oral QAC supper  . levothyroxine  75 mcg Oral QAC breakfast  . memantine  10 mg Oral BID  . mupirocin ointment  1 application Nasal BID  . pantoprazole  40 mg Oral Daily  . rosuvastatin  5 mg Oral QHS  . tranexamic acid (CYKLOKAPRON)  topical -INTRAOP  2,000 mg Topical Once   Continuous Infusions: . sodium chloride 75 mL/hr at 04/25/18 1038  . cefTRIAXone (ROCEPHIN)  IV 1 g (04/25/18 1233)  . lactated ringers 10 mL/hr at 04/23/18 1134  . methocarbamol (ROBAXIN)  IV     PRN Meds:.acetaminophen **OR** acetaminophen, acetaminophen, alum & mag hydroxide-simeth, fentaNYL (SUBLIMAZE) injection, hydrALAZINE, HYDROcodone-acetaminophen, HYDROcodone-acetaminophen, magnesium citrate, menthol-cetylpyridinium **OR** phenol, methocarbamol **OR** methocarbamol (ROBAXIN)  IV, morphine injection, ondansetron **OR** ondansetron (ZOFRAN) IV, ondansetron **OR** ondansetron (ZOFRAN) IV, polyethylene glycol, sorbitol    Anti-infectives (From admission, onward)   Start      Dose/Rate Route Frequency Ordered Stop   04/24/18 1400  cefTRIAXone (ROCEPHIN) 1 g in sodium chloride 0.9 % 100 mL IVPB  Status:  Discontinued     1 g 200 mL/hr over 30 Minutes Intravenous Every 24 hours 04/24/18 1132 04/24/18 1137   04/24/18 1230  cefTRIAXone (ROCEPHIN) 1 g in sodium chloride 0.9 % 100 mL IVPB     1 g 200 mL/hr over 30 Minutes Intravenous Every 24 hours 04/24/18 1130     04/23/18 1645  ceFAZolin (ANCEF) IVPB 2g/100 mL premix     2 g 200 mL/hr over 30 Minutes Intravenous Every 6 hours 04/23/18 1631 04/24/18 0503   04/23/18 1405  vancomycin (VANCOCIN) powder  Status:  Discontinued       As needed 04/23/18 1415 04/23/18 1448   04/23/18 1118  ceFAZolin (ANCEF) 2-4 GM/100ML-% IVPB    Note to Pharmacy:  [name]   : cabinet override      04/23/18 1118 04/23/18 1243   04/23/18 1115  ceFAZolin (ANCEF) IVPB 2g/100 mL premix     2 g 200 mL/hr over 30 Minutes Intravenous On call to O.R. 04/23/18 1114 04/23/18 1313   04/23/18 0045  aztreonam (AZACTAM) 1 g in sodium chloride 0.9 % 100 mL IVPB     1 g 200 mL/hr over 30 Minutes Intravenous  Once 04/23/18 0037 04/23/18 0222        Objective:   Vitals:   04/25/18 0547 04/25/18 0813 04/25/18 0900 04/25/18 1130  BP:  (!) 83/59  (!) 82/54  Pulse: (!) 55     Resp: 15     Temp:   98.6 F (37 C)   TempSrc:  Oral    SpO2: 100%     Weight:      Height:        Wt Readings from Last 3 Encounters:  04/23/18 69.2 kg (152 lb 8.9 oz)  01/10/18 71.8 kg (158 lb 3.2 oz)  10/30/16 64 kg (141 lb)     Intake/Output Summary (Last 24 hours) at 04/25/2018 1518 Last data filed at 04/25/2018 1300 Gross per 24 hour  Intake 1518.12 ml  Output 800 ml  Net 718.12 ml     Physical Exam  Gen:- Awake Alert, pleasantly confused HEENT:- [place].AT, No sclera icterus Neck-Supple Neck,No JVD,.  Lungs-  CTAB , fair air movement CV- S1, S2 normal Abd-  +ve B.Sounds, Abd Soft, No tenderness,    Extremity/Skin:- No  edema,   left hip postop wound  intact, Psych-significant cognitive deficits Neuro-no new focal deficits, no tremors   Data Review:   Micro Results Recent Results (from the past 240 hour(s))  Urine culture     Status: Abnormal   Collection Time: 04/23/18 12:36 AM  Result Value Ref Range Status   Specimen Description   Final    URINE, CLEAN CATCH Performed at [facility], [address]  [place], [place] [address]    Special Requests   Final    Normal Performed at [facility], [address], [place], [place] [address]    Culture >=100,000 COLONIES/mL CITROBACTER KOSERI (A)  Final   Report Status 04/25/2018 FINAL  Final   Organism ID, Bacteria CITROBACTER KOSERI (A)  Final      Susceptibility   Citrobacter koseri - MIC*    CEFAZOLIN <=4 SENSITIVE Sensitive     CEFTRIAXONE <=1 SENSITIVE Sensitive     CIPROFLOXACIN <=0.25 SENSITIVE Sensitive     GENTAMICIN <=1 SENSITIVE Sensitive     IMIPENEM <=0.25 SENSITIVE Sensitive     NITROFURANTOIN 32 SENSITIVE Sensitive     TRIMETH/SULFA <=20 SENSITIVE Sensitive     PIP/TAZO <=4 SENSITIVE Sensitive     * >=100,000 COLONIES/mL CITROBACTER KOSERI  Surgical pcr screen     Status: Abnormal   Collection Time: 04/23/18  6:57 AM  Result Value Ref Range Status   MRSA, PCR NEGATIVE NEGATIVE Final   Staphylococcus aureus POSITIVE (A) NEGATIVE Final    Comment: (NOTE) The Xpert SA Assay (FDA approved for NASAL specimens in patients 68 years of age and older), is one component of a comprehensive surveillance program. It is not intended to diagnose infection nor to guide or monitor treatment.   Culture, Urine     Status: None   Collection Time: 04/24/18 11:28 AM  Result Value Ref Range Status   Specimen Description URINE, RANDOM  Final   Special Requests NONE  Final   Culture   Final    NO GROWTH Performed at [facility], [address], [place], [place] [address]    Report Status 04/25/2018 FINAL  Final    Radiology Reports Dg Ribs Unilateral W/chest  Left  Result Date: 04/23/2018 CLINICAL DATA:  Left axillary pain after fall. EXAM: LEFT RIBS AND CHEST - 3+ VIEW COMPARISON:  On 04/23/2013 FINDINGS: Remote left posterolateral sixth rib fracture. Cardiomegaly with moderate to marked aortic atherosclerosis with uncoiling of the thoracic aorta. No aneurysm. Atelectasis is identified at the left lung base. No pneumothorax or pulmonary contusions. Lumbar spinal fusion L3 through L5 with interbody blocks in place. Chronic L2 compression deformity with moderate 50% height loss. Inferior endplate compression of T8 with 50% height loss is also noted. These appear chronic and stable. IMPRESSION: 1. Remote left sixth rib fracture. No acute displaced rib fracture is currently noted. 2. Cardiomegaly with aortic atherosclerosis. 3. Thoracolumbar spondylosis with lower lumbar spinal fusion and chronic compression fractures of T8 and L2. Electronically Signed   By: Ashley Royalty M.D.   On: 04/23/2018 00:21   Ct Head Wo Contrast  Result Date: 04/24/2018 CLINICAL DATA:  Altered mental status, baseline dementia. Recent hip fracture with repair. EXAM: CT HEAD WITHOUT CONTRAST TECHNIQUE: Contiguous axial images were obtained from the base of the skull through the vertex without intravenous contrast. COMPARISON:  CT head 04/01/2012.  MR head 12/08/2011. FINDINGS: The patient was unable to remain motionless for the exam. Small or subtle lesions could be overlooked. Brain: No evidence for acute infarction, hemorrhage, mass lesion, hydrocephalus, or extra-axial fluid. Advanced atrophy. Extensive chronic microvascular ischemic change. Chronic LEFT occipital infarct. Vascular: Calcification of the cavernous internal carotid arteries consistent with cerebrovascular atherosclerotic disease. No signs of intracranial large vessel occlusion. Skull: No fracture or osseous lesion. Sinuses/Orbits: No significant opacity or layering fluid. Negative orbits. Other: LEFT carotid stent. IMPRESSION:  Motion degraded exam demonstrating advanced atrophy and extensive small vessel disease. Old LEFT  hemisphere infarct, evidence for previous LEFT carotid surgery. No acute intracranial findings. Electronically Signed   By: Staci Righter M.D.   On: 04/24/2018 13:24   Pelvis Portable  Result Date: 04/23/2018 CLINICAL DATA:  Anterior left hip hemiarthroplasty. EXAM: PORTABLE PELVIS 1-2 VIEWS COMPARISON:  04/22/2018 FINDINGS: Right hip arthroplasty unchanged. Interval placement of left hip arthroplasty intact and normally located. Mild diffuse osteopenia. Remainder of the exam is unchanged. IMPRESSION: Interval left hip arthroplasty intact. Electronically Signed   By: Marin Olp M.D.   On: 04/23/2018 16:27   Dg C-arm 1-60 Min  Result Date: 04/23/2018 CLINICAL DATA:  Left hip arthroplasty. EXAM: OPERATIVE left HIP (WITH PELVIS IF PERFORMED)  VIEWS TECHNIQUE: Fluoroscopic spot image(s) were submitted for interpretation post-operatively. COMPARISON:  04/22/2018 FINDINGS: There has been interval placement of a left hip arthroplasty in adequate position normally located. Partially visualized right hip arthroplasty is present. Remainder the exam is unchanged. IMPRESSION: Placement of left hip arthroplasty intact. Electronically Signed   By: Marin Olp M.D.   On: 04/23/2018 15:01   Dg Hip Operative Unilat W Or W/o Pelvis Left  Result Date: 04/23/2018 CLINICAL DATA:  Left hip arthroplasty. EXAM: OPERATIVE left HIP (WITH PELVIS IF PERFORMED)  VIEWS TECHNIQUE: Fluoroscopic spot image(s) were submitted for interpretation post-operatively. COMPARISON:  04/22/2018 FINDINGS: There has been interval placement of a left hip arthroplasty in adequate position normally located. Partially visualized right hip arthroplasty is present. Remainder the exam is unchanged. IMPRESSION: Placement of left hip arthroplasty intact. Electronically Signed   By: Marin Olp M.D.   On: 04/23/2018 15:01   Dg Hip Unilat W Or Wo Pelvis  2-3 Views Left  Result Date: 04/23/2018 CLINICAL DATA:  Left hip pain after fall. EXAM: DG HIP (WITH OR WITHOUT PELVIS) 2-3V LEFT COMPARISON:  None. FINDINGS: An acute, varus angulated and slightly impacted left transcervical femoral neck fracture is identified. Lower lumbar spinal fusion hardware appears intact. The bony pelvis and pubic rami are intact. No acetabular fracture is noted. Partially included uncemented right bipolar hip arthroplasty is in place without complicating features. Dense bi-iliofemoral atherosclerosis. IMPRESSION: Acute varus angulated transcervical fracture of the left femur with mild impaction of the femoral neck upon the opposing femoral neck. Electronically Signed   By: Ashley Royalty M.D.   On: 04/23/2018 00:26     CBC Recent Labs  Lab 04/22/18 2313 04/24/18 0543 04/24/18 1512 04/25/18 0209  WBC 9.8 12.4* 16.6* 15.6*  HGB 12.7 10.0* 9.6* 9.6*  HCT 40.6 32.1* 31.2* 30.3*  PLT 178 136* 131* 131*  MCV 98.8 98.5 99.4 97.1  MCH 30.9 30.7 30.6 30.8  MCHC 31.3 31.2 30.8 31.7  RDW 14.3 14.0 14.2 14.1  LYMPHSABS 1.6  --  1.0  --   MONOABS 0.8  --  1.7*  --   EOSABS 0.1  --  0.2  --   BASOSABS 0.0  --  0.0  --     Chemistries  Recent Labs  Lab 04/22/18 2313 04/24/18 0543 04/24/18 1512 04/25/18 0209  NA 136 135 132* 133*  K 4.7 4.0 4.2 4.0  CL 103 102 100 100  CO2 24 25 20* 23  GLUCOSE 139* 103* 136* 126*  BUN 45* 29* 31* 29*  CREATININE 1.77* 1.79* 1.97* 1.88*  CALCIUM 8.7* 7.9* 7.5* 7.8*  AST 19 23 26 26   ALT 13 9 7  <5  ALKPHOS 55 42 43 43  BILITOT 0.6 0.4 0.5 0.4   ------------------------------------------------------------------------------------------------------------------ No results for input(s): CHOL, HDL, LDLCALC,  TRIG, CHOLHDL, LDLDIRECT in the last 72 hours.  Lab Results  Component Value Date   HGBA1C 5.7 (H) 04/01/2012    ------------------------------------------------------------------------------------------------------------------ Recent Labs    04/24/18 1133  TSH 0.905   ------------------------------------------------------------------------------------------------------------------ No results for input(s): VITAMINB12, FOLATE, FERRITIN, TIBC, IRON, RETICCTPCT in the last 72 hours.  Coagulation profile Recent Labs  Lab 04/22/18 2313  INR 0.93    No results for input(s): DDIMER in the last 72 hours.  Cardiac Enzymes No results for input(s): CKMB, TROPONINI, MYOGLOBIN in the last 168 hours.  Invalid input(s): CK ------------------------------------------------------------------------------------------------------------------ No results found for: BNP   Roxan Hockey M.D on 04/25/2018 at 3:18 PM   Go to [email or web address] - password [email or web address] for contact info  [facility] - Office  [phone]

## 2018-04-25 NOTE — Social Work (Signed)
Pt husband given offers for SNF placement, friend visiting. Verbalized understanding of packet and will review offers with pt son and daughter in law. Pt has offers in [place] and [place] counties.   CSW will f/u in morning for placement preference.   [place], [place] Work [phone]

## 2018-04-25 NOTE — Clinical Social Work Note (Signed)
Clinical Social Work Assessment  Patient Details  Name: Katie Pace MRN: [id number] Date of Birth: 1935-10-29  Date of referral:  04/25/18               Reason for consult:  Facility Placement, Discharge Planning                Permission sought to share information with:  Facility Sport and exercise psychologist, Family Supports Permission granted to share information::  Yes, Verbal Permission Granted  Name::      Schylar Allard::     Relationship::  son(daughter in Sports coach, granddaughter also present)  Contact Information:     Housing/Transportation Living arrangements for the past 2 months:  Single Family Home Source of Information:  Adult Children Patient Interpreter Needed:  None Criminal Activity/Legal Involvement Pertinent to Current Situation/Hospitalization:  No - Comment as needed Significant Relationships:  Adult Children, Other Family Members, Spouse Lives with:  Spouse Do you feel safe going back to the place where you live?  Yes Need for family participation in patient care:  Yes (Comment)  Care giving concerns: Pt has dementia, requires assistance with mobility and IADLs/ADLs. Pt has attentive family that would like for pt to have therapies before returning home.    Social Worker assessment / plan: CSW met with pt family at bedside, pt only oriented to self/slept through assessment. When pt roused CSW explained visit and pt agreed to [place] speaking with family. Pt son, daughter in law, and granddaughter present for assessment, pt spouse arrived at the end of assessment.   Pt and pt spouse live on pt son's property in [place], they are available to assist but do think that pt needs more care than they can provide at current time. Pt family agreeable to fax out and look at offers for pt. All questions answered, pt family curious as to discharge timing.   Employment status:  Retired Forensic scientist:  [employer] PT Recommendations:  [place], [place] / Referral to community resources:  [place]  Patient/Family's Response to care:  Pt family state understanding of CSW role, recommendations and are agreeable to CSW pursuing SNF placement.   Patient/Family's Understanding of and Emotional Response to Diagnosis, Current Treatment, and Prognosis:  Pt family states understanding of diagnosis, current treatment and prognosis. Pt family expresses reasonable expectations for pt, are attentive and caring, and display emotionally appropriate reaction to hospitalization and pt care needs going forward.   Emotional Assessment Appearance:  Appears stated age Attitude/Demeanor/Rapport:  Lethargic Affect (typically observed):  Quiet, Other(Lethargic) Orientation:  Oriented to Self, Fluctuating Orientation (Suspected and/or reported Sundowners) Alcohol / Substance use:  Not Applicable Psych involvement (Current and /or in the community):  No (Comment)  Discharge Needs  Concerns to be addressed:  Care Coordination, Discharge Planning Concerns Readmission within the last 30 days:  No Current discharge risk:  Cognitively Impaired, Dependent with Mobility Barriers to Discharge:  Ship broker, Continued Medical Work up   [employer], [place] 04/25/2018, 12:32 PM

## 2018-04-25 NOTE — Progress Notes (Signed)
Subjective: 2 Days Post-Op Procedure(s) (LRB): ANTERIOR LEFT HIP HEMIARTHROPALSTY (Left) Patient reports pain as mild.  Resting comfortably this am.  Most of pain has resolved  Objective: Vital signs in last 24 hours: Temp:  [98.3 F (36.8 C)-99.4 F (37.4 C)] 98.6 F (37 C) (07/22 0334) Pulse Rate:  [54-68] 55 (07/22 0547) Resp:  [14-22] 15 (07/22 0547) BP: (85-164)/(49-94) 85/60 (07/22 0337) SpO2:  [85 %-100 %] 100 % (07/22 0547)  Intake/Output from previous day: 07/21 0701 - 07/22 0700 In: 1538.1 [P.O.:390; I.V.:1048.1; IV Piggyback:100] Out: 200 [Urine:200] Intake/Output this shift: No intake/output data recorded.  Recent Labs    04/22/18 2313 04/24/18 0543 04/24/18 1512 04/25/18 0209  HGB 12.7 10.0* 9.6* 9.6*   Recent Labs    04/24/18 1512 04/25/18 0209  WBC 16.6* 15.6*  RBC 3.14* 3.12*  HCT 31.2* 30.3*  PLT 131* 131*   Recent Labs    04/24/18 1512 04/25/18 0209  NA 132* 133*  K 4.2 4.0  CL 100 100  CO2 20* 23  BUN 31* 29*  CREATININE 1.97* 1.88*  GLUCOSE 136* 126*  CALCIUM 7.5* 7.8*   Recent Labs    04/22/18 2313  INR 0.93    Neurologically intact Neurovascular intact Sensation intact distally Intact pulses distally Dorsiflexion/Plantar flexion intact Incision: dressing C/D/I No cellulitis present Compartment soft    Assessment/Plan: 2 Days Post-Op Procedure(s) (LRB): ANTERIOR LEFT HIP HEMIARTHROPALSTY (Left) Up with therapy  WBAT LLE- no precautions ABLA-mild and stable D/C dispo per medicine F/u with Dr. Erlinda Hong 2 weeks post-op for suture removal    Katie Pace 04/25/2018, 7:25 AM

## 2018-04-25 NOTE — Progress Notes (Signed)
Physical Therapy Treatment Patient Details Name: Katie Pace MRN: [id number] DOB: Aug 22, 1936 Today's Date: 04/25/2018    History of Present Illness Adm 04/22/18 after fall at home (her walker became entangled with another piece of furniture) resulting in Lt hip fracture. 7/20 surgery with anterior approach bipolar prosthesis  PMH-dementia, HTN,  DM, CKD III, Rt TKR, Rt THR, AAA repair    PT Comments    Continuing work on functional mobility and activity tolerance;  Much imporved mental status and ability to participate in mobility activities; Added to PT goals set yesterday; Still requires +2 assist for transfers;   Continue to agree with SNF for post-acute rehab   Follow Up Recommendations  SNF;Supervision/Assistance - 24 hour     Equipment Recommendations  Rolling walker with 5" wheels;3in1 (PT)(youth-sized)    Recommendations for Other Services       Precautions / Restrictions Precautions Precautions: Fall Restrictions LLE Weight Bearing: Weight bearing as tolerated    Mobility  Bed Mobility Overal bed mobility: Needs Assistance Bed Mobility: Supine to Sit     Supine to sit: Max assist     General bed mobility comments: Cues for technqiue and mod assist and use of bed pad to help with half-bridging hips to EOB; Max assist to elevate trunk to sit  Transfers Overall transfer level: Needs assistance Equipment used: 2 person hand held assist Transfers: Sit to/from [employer] Transfers Sit to Stand: +2 physical assistance;Max assist Stand pivot transfers: +2 physical assistance;Max assist       General transfer comment: Performed stand pivot transfer bed to [facility] on pt's stronger R side with knees blocked for safety and stability; then sit to stand from [facility] for cleaning after BM, and then another stand pivot transfer to recliner on pt's R  Ambulation/Gait                 Stairs             Wheelchair Mobility    Modified Rankin (Stroke  Patients Only)       Balance Overall balance assessment: Needs assistance Sitting-balance support: Bilateral upper extremity supported Sitting balance-[name]: Fair       Standing balance-[name]: Poor                              Cognition Arousal/Alertness: Awake/alert Behavior During Therapy: WFL for tasks assessed/performed Overall Cognitive Status: Impaired/Different from baseline Area of Impairment: Memory;Safety/judgement                   Current Attention Level: Sustained Memory: Decreased short-term memory Following Commands: Follows one step commands consistently       General Comments: Much improved mental status compared to last session; Pt does not have memory of the surgery      Exercises      General Comments        Pertinent Vitals/Pain Pain Assessment: Faces Faces Pain Scale: Hurts even more Pain Location: When moving LLE; no grimacing at rest Pain Descriptors / Indicators: Grimacing Pain Intervention(s): Monitored during session    Home Living                      Prior Function            PT Goals (current goals can now be found in the care plan section) Acute Rehab PT Goals Patient Stated Goal: very specific goal of getting to  commode to have BM this session PT Goal Formulation: Patient unable to participate in goal setting Time For Goal Achievement: 05/08/18 Potential to Achieve Goals: Fair Progress towards PT goals: Progressing toward goals(much improved mental status)    Frequency    Min 5X/week      PT Plan Current plan remains appropriate    Co-evaluation              AM-PAC PT "6 Clicks" Daily Activity  Outcome Measure  Difficulty turning over in bed (including adjusting bedclothes, sheets and blankets)?: Unable Difficulty moving from lying on back to sitting on the side of the bed? : Unable Difficulty sitting down on and standing up from a chair with arms (e.g., wheelchair,  bedside commode, etc,.)?: Unable Help needed moving to and from a bed to chair (including a wheelchair)?: A Lot Help needed walking in hospital room?: Total Help needed climbing 3-5 steps with a railing? : Total 6 Click Score: 7    End of Session Equipment Utilized During Treatment: Gait belt Activity Tolerance: Patient tolerated treatment well Patient left: in chair;with call bell/phone within reach;with chair alarm set Nurse Communication: Mobility status PT Visit Diagnosis: Unsteadiness on feet (R26.81);Repeated falls (R29.6);Pain Pain - Right/Left: Left Pain - part of body: Hip     Time: 2897-9150 PT Time Calculation (min) (ACUTE ONLY): 24 min  Charges:  $Therapeutic Activity: 23-37 mins                    G Codes:       Roney Marion, PT  Acute Rehabilitation Services Pager [phone] Office [phone]    [place] 04/25/2018, 4:56 PM

## 2018-04-25 NOTE — [place] FL2 (Addendum)
[place] MEDICAID FL2 LEVEL OF CARE SCREENING TOOL     IDENTIFICATION  Patient Name: Katie Pace Birthdate: [birth year] Sex: female Admission Date (Current Location): 04/22/2018  [facility] and [place] Number:  [employer] and Address:  [facility] [place]. [facility], [place] [address], [place], [place] [address]      Provider Number: [id number]  Attending Physician Name and Address:  Roxan Hockey, MD  Relative Name and Phone Number:  Evalyne Cortopassi, husband, [phone]    Current Level of Care: Hospital Recommended Level of Care: [place] Prior Approval Number:    Date Approved/Denied:   PASRR Number: [id number]  Discharge Plan: SNF    Current Diagnoses: Patient Active Problem List   Diagnosis Date Noted  . Acute cystitis without hematuria   . CKD (chronic kidney disease) stage 3, GFR 30-59 ml/min (HCC) 04/23/2018  . Hypothyroidism 04/23/2018  . Femur fracture, left ([place]) 04/23/2018  . Hip fracture ([place]) 04/23/2018  . PAD (peripheral artery disease) ([place]) 02/18/2016  . Carotid stenosis, asymptomatic 10/29/2015  . Difficulty in walking(719.7) 06/26/2013  . Bilateral leg weakness 06/26/2013  . Occlusion and stenosis of carotid artery without mention of cerebral infarction 04/04/2013  . Aftercare following surgery of the circulatory system, [place] 04/04/2013  . Anemia 10/19/2012  . Spondylolisthesis of lumbar region L3-4 L4-5 with stenosis 10/18/2012  . CKD (chronic kidney disease), stage II 10/18/2012  . C. difficile diarrhea 04/02/2012  . Acute renal failure ([place]) 04/01/2012  . Encephalopathy 04/01/2012  . Sepsis(995.91) 04/01/2012  . Vomiting 04/01/2012  . Hypokalemia 04/01/2012  . Chest pain 02/08/2012  . Renal insufficiency 02/08/2012  . 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 Hypertension   . Diabetes mellitus ([place])   . AAA (abdominal aortic aneurysm) ([place]) 12/28/2011  . Abdominal aneurysm without mention of rupture 11/09/2011     Orientation RESPIRATION BLADDER Height & Weight    Oriented x Person    O2(nasal canula 1L) Continent Weight: 152 lb 8.9 oz (69.2 kg) Height:  5\' 4"  (162.6 cm)  BEHAVIORAL SYMPTOMS/MOOD NEUROLOGICAL BOWEL NUTRITION STATUS     Hx of dementia Continent Diet(see discharge summary)  AMBULATORY STATUS COMMUNICATION OF NEEDS Skin   Extensive Assist Verbally Surgical wounds(incision on left hip)                       Personal Care Assistance Level of Assistance  Bathing, Feeding, Dressing Bathing Assistance: Maximum assistance Feeding assistance: Independent Dressing Assistance: Maximum assistance     Functional Limitations Info  Sight, Hearing, Speech Sight Info: Adequate Hearing Info: Adequate Speech Info: Adequate    SPECIAL CARE FACTORS FREQUENCY  PT (By licensed PT), OT (By licensed OT)     PT Frequency: 5x week OT Frequency: 5x week            Contractures Contractures Info: Not present    Additional Factors Info  Code Status, Allergies, Psychotropic Code Status Info: Full Code Allergies Info: CODEINE, DILAUDID HYDROMORPHONE HCL, LINCOMYCIN HCL, MACRODANTIN, ZOLPIDEM TARTRATE, AZITHROMYCIN, PENICILLINS  Psychotropic Info: amitriptyline (ELAVIL) tablet 25 mg PO daily at bedtime; memantine (NAMENDA) tablet 10 mg 2x daily PO         Current Medications (04/25/2018):  This is the current hospital active medication list Current Facility-Administered Medications  Medication Dose Route Frequency Provider Last Rate Last Dose  . 0.9 %  sodium chloride infusion   Intravenous Continuous Leandrew Koyanagi, MD 75 mL/hr at 04/25/18 0500    . acetaminophen (  TYLENOL) tablet 650 mg  650 mg Oral Q6H PRN Leandrew Koyanagi, MD   650 mg at 04/24/18 2049   Or  . acetaminophen (TYLENOL) suppository 650 mg  650 mg Rectal Q6H PRN Leandrew Koyanagi, MD      . acetaminophen (TYLENOL) tablet 325-650 mg  325-650 mg Oral Q6H PRN Leandrew Koyanagi, MD      . alum & mag hydroxide-simeth  (MAALOX/MYLANTA) 200-200-20 MG/5ML suspension 30 mL  30 mL Oral Q4H PRN Leandrew Koyanagi, MD      . amitriptyline (ELAVIL) tablet 25 mg  25 mg Oral QHS Leandrew Koyanagi, MD   25 mg at 04/24/18 2207  . atenolol (TENORMIN) tablet 100 mg  100 mg Oral Daily Leandrew Koyanagi, MD   100 mg at 04/24/18 0912  . cefTRIAXone (ROCEPHIN) 1 g in sodium chloride 0.9 % 100 mL IVPB  1 g Intravenous Q24H Benito Mccreedy, MD   Stopped at 04/24/18 1503  . cholecalciferol (VITAMIN D) tablet 3,000 Units  3,000 Units Oral Daily Leandrew Koyanagi, MD   3,000 Units at 04/24/18 0912  . diltiazem (CARDIZEM CD) 24 hr capsule 240 mg  240 mg Oral Daily Leandrew Koyanagi, MD   240 mg at 04/24/18 0911  . dipyridamole-aspirin (AGGRENOX) 200-25 MG per 12 hr capsule 1 capsule  1 capsule Oral BID Leandrew Koyanagi, MD   1 capsule at 04/24/18 2223  . docusate sodium (COLACE) capsule 100 mg  100 mg Oral BID Leandrew Koyanagi, MD   100 mg at 04/24/18 2207  . fentaNYL (SUBLIMAZE) injection 25 mcg  25 mcg Intravenous Q1H PRN Leandrew Koyanagi, MD   100 mcg at 04/23/18 1254  . hydrALAZINE (APRESOLINE) injection 10 mg  10 mg Intravenous Q4H PRN Leandrew Koyanagi, MD      . HYDROcodone-acetaminophen [facility]) 7.5-325 MG per tablet 1-2 tablet  1-2 tablet Oral Q4H PRN Leandrew Koyanagi, MD   1 tablet at 04/23/18 2223  . HYDROcodone-acetaminophen (NORCO/VICODIN) 5-325 MG per tablet 1-2 tablet  1-2 tablet Oral Q4H PRN Leandrew Koyanagi, MD      . hydrocortisone (CORTEF) tablet 20 mg  20 mg Oral Q breakfast Leandrew Koyanagi, MD   20 mg at 04/25/18 0813   And  . hydrocortisone (CORTEF) tablet 10 mg  10 mg Oral QAC supper Leandrew Koyanagi, MD   10 mg at 04/24/18 1759  . lactated ringers infusion   Intravenous Continuous Leandrew Koyanagi, MD 10 mL/hr at 04/23/18 1134    . levothyroxine (SYNTHROID, LEVOTHROID) tablet 75 mcg  75 mcg Oral QAC breakfast Leandrew Koyanagi, MD   75 mcg at 04/25/18 0813  . magnesium citrate solution 1 Bottle  1 Bottle Oral Once PRN Leandrew Koyanagi, MD      . memantine  [facility]) tablet 10 mg  10 mg Oral BID Leandrew Koyanagi, MD   10 mg at 04/24/18 2207  . menthol-cetylpyridinium (CEPACOL) lozenge 3 mg  1 lozenge Oral PRN Leandrew Koyanagi, MD       Or  . phenol (CHLORASEPTIC) mouth spray 1 spray  1 spray Mouth/Throat PRN Leandrew Koyanagi, MD      . methocarbamol (ROBAXIN) tablet 500 mg  500 mg Oral Q6H PRN Leandrew Koyanagi, MD       Or  . methocarbamol (ROBAXIN) 500 mg in dextrose 5 % 50 mL IVPB  500 mg Intravenous Q6H PRN Leandrew Koyanagi, MD      .  morphine 2 MG/ML injection 1 mg  1 mg Intravenous Q2H PRN Leandrew Koyanagi, MD      . mupirocin ointment (BACTROBAN) 2 %   Nasal BID Emokpae, Courage, MD      . ondansetron (ZOFRAN) tablet 4 mg  4 mg Oral Q6H PRN Leandrew Koyanagi, MD       Or  . ondansetron [facility]) injection 4 mg  4 mg Intravenous Q6H PRN Leandrew Koyanagi, MD   4 mg at 04/23/18 1405  . ondansetron (ZOFRAN) tablet 4 mg  4 mg Oral Q6H PRN Leandrew Koyanagi, MD       Or  . ondansetron [facility]) injection 4 mg  4 mg Intravenous Q6H PRN Leandrew Koyanagi, MD      . pantoprazole (PROTONIX) EC tablet 40 mg  40 mg Oral Daily Leandrew Koyanagi, MD   40 mg at 04/24/18 0911  . polyethylene glycol (MIRALAX / GLYCOLAX) packet 17 g  17 g Oral Daily PRN Leandrew Koyanagi, MD      . rosuvastatin (CRESTOR) tablet 5 mg  5 mg Oral QHS Leandrew Koyanagi, MD   5 mg at 04/24/18 2207  . sorbitol 70 % solution 30 mL  30 mL Oral Daily PRN Leandrew Koyanagi, MD      . tranexamic acid (CYKLOKAPRON) 2,000 mg in sodium chloride 0.9 % 50 mL Topical Application  4,239 mg Topical Once Leandrew Koyanagi, MD         Discharge Medications: Please see discharge summary for a list of discharge medications.  Relevant Imaging Results:  Relevant Lab Results:   Additional Information SS# [place], [place]

## 2018-04-26 DIAGNOSIS — E039 Hypothyroidism, unspecified: Secondary | ICD-10-CM | POA: Diagnosis present

## 2018-04-26 DIAGNOSIS — I129 Hypertensive chronic kidney disease with stage 1 through stage 4 chronic kidney disease, or unspecified chronic kidney disease: Secondary | ICD-10-CM | POA: Diagnosis present

## 2018-04-26 DIAGNOSIS — N990 Postprocedural (acute) (chronic) kidney failure: Secondary | ICD-10-CM | POA: Diagnosis not present

## 2018-04-26 DIAGNOSIS — M6281 Muscle weakness (generalized): Secondary | ICD-10-CM | POA: Diagnosis not present

## 2018-04-26 DIAGNOSIS — R4020 Unspecified coma: Secondary | ICD-10-CM | POA: Diagnosis not present

## 2018-04-26 DIAGNOSIS — M96662 Fracture of femur following insertion of orthopedic implant, joint prosthesis, or bone plate, left leg: Secondary | ICD-10-CM | POA: Diagnosis not present

## 2018-04-26 DIAGNOSIS — L03115 Cellulitis of right lower limb: Secondary | ICD-10-CM | POA: Diagnosis not present

## 2018-04-26 DIAGNOSIS — E873 Alkalosis: Secondary | ICD-10-CM | POA: Diagnosis not present

## 2018-04-26 DIAGNOSIS — Z8673 Personal history of transient ischemic attack (TIA), and cerebral infarction without residual deficits: Secondary | ICD-10-CM | POA: Diagnosis not present

## 2018-04-26 DIAGNOSIS — Z471 Aftercare following joint replacement surgery: Secondary | ICD-10-CM | POA: Diagnosis not present

## 2018-04-26 DIAGNOSIS — T8144XA Sepsis following a procedure, initial encounter: Secondary | ICD-10-CM | POA: Diagnosis not present

## 2018-04-26 DIAGNOSIS — E871 Hypo-osmolality and hyponatremia: Secondary | ICD-10-CM | POA: Diagnosis not present

## 2018-04-26 DIAGNOSIS — D649 Anemia, unspecified: Secondary | ICD-10-CM | POA: Diagnosis not present

## 2018-04-26 DIAGNOSIS — E118 Type 2 diabetes mellitus with unspecified complications: Secondary | ICD-10-CM | POA: Diagnosis not present

## 2018-04-26 DIAGNOSIS — J9691 Respiratory failure, unspecified with hypoxia: Secondary | ICD-10-CM | POA: Diagnosis not present

## 2018-04-26 DIAGNOSIS — A419 Sepsis, unspecified organism: Secondary | ICD-10-CM | POA: Diagnosis not present

## 2018-04-26 DIAGNOSIS — E230 Hypopituitarism: Secondary | ICD-10-CM | POA: Diagnosis not present

## 2018-04-26 DIAGNOSIS — I700 Atherosclerosis of aorta: Secondary | ICD-10-CM | POA: Diagnosis not present

## 2018-04-26 DIAGNOSIS — I10 Essential (primary) hypertension: Secondary | ICD-10-CM | POA: Diagnosis not present

## 2018-04-26 DIAGNOSIS — Z743 Need for continuous supervision: Secondary | ICD-10-CM | POA: Diagnosis not present

## 2018-04-26 DIAGNOSIS — N170 Acute kidney failure with tubular necrosis: Secondary | ICD-10-CM

## 2018-04-26 DIAGNOSIS — E038 Other specified hypothyroidism: Secondary | ICD-10-CM | POA: Diagnosis not present

## 2018-04-26 DIAGNOSIS — E8770 Fluid overload, unspecified: Secondary | ICD-10-CM | POA: Diagnosis not present

## 2018-04-26 DIAGNOSIS — K219 Gastro-esophageal reflux disease without esophagitis: Secondary | ICD-10-CM | POA: Diagnosis not present

## 2018-04-26 DIAGNOSIS — R062 Wheezing: Secondary | ICD-10-CM | POA: Diagnosis not present

## 2018-04-26 DIAGNOSIS — S3289XA Fracture of other parts of pelvis, initial encounter for closed fracture: Secondary | ICD-10-CM | POA: Diagnosis not present

## 2018-04-26 DIAGNOSIS — Z79890 Hormone replacement therapy (postmenopausal): Secondary | ICD-10-CM | POA: Diagnosis not present

## 2018-04-26 DIAGNOSIS — L03116 Cellulitis of left lower limb: Secondary | ICD-10-CM | POA: Diagnosis not present

## 2018-04-26 DIAGNOSIS — R262 Difficulty in walking, not elsewhere classified: Secondary | ICD-10-CM | POA: Diagnosis not present

## 2018-04-26 DIAGNOSIS — E119 Type 2 diabetes mellitus without complications: Secondary | ICD-10-CM | POA: Diagnosis not present

## 2018-04-26 DIAGNOSIS — Z96642 Presence of left artificial hip joint: Secondary | ICD-10-CM | POA: Diagnosis not present

## 2018-04-26 DIAGNOSIS — Z96641 Presence of right artificial hip joint: Secondary | ICD-10-CM | POA: Diagnosis present

## 2018-04-26 DIAGNOSIS — R4182 Altered mental status, unspecified: Secondary | ICD-10-CM | POA: Diagnosis not present

## 2018-04-26 DIAGNOSIS — N184 Chronic kidney disease, stage 4 (severe): Secondary | ICD-10-CM | POA: Diagnosis not present

## 2018-04-26 DIAGNOSIS — F0390 Unspecified dementia without behavioral disturbance: Secondary | ICD-10-CM | POA: Diagnosis not present

## 2018-04-26 DIAGNOSIS — Z87891 Personal history of nicotine dependence: Secondary | ICD-10-CM | POA: Diagnosis not present

## 2018-04-26 DIAGNOSIS — N390 Urinary tract infection, site not specified: Secondary | ICD-10-CM | POA: Diagnosis not present

## 2018-04-26 DIAGNOSIS — R279 Unspecified lack of coordination: Secondary | ICD-10-CM | POA: Diagnosis not present

## 2018-04-26 DIAGNOSIS — G9341 Metabolic encephalopathy: Secondary | ICD-10-CM | POA: Diagnosis not present

## 2018-04-26 DIAGNOSIS — E876 Hypokalemia: Secondary | ICD-10-CM | POA: Diagnosis not present

## 2018-04-26 DIAGNOSIS — Z8781 Personal history of (healed) traumatic fracture: Secondary | ICD-10-CM | POA: Diagnosis not present

## 2018-04-26 DIAGNOSIS — Z96651 Presence of right artificial knee joint: Secondary | ICD-10-CM | POA: Diagnosis present

## 2018-04-26 DIAGNOSIS — Z79899 Other long term (current) drug therapy: Secondary | ICD-10-CM | POA: Diagnosis not present

## 2018-04-26 DIAGNOSIS — E1122 Type 2 diabetes mellitus with diabetic chronic kidney disease: Secondary | ICD-10-CM | POA: Diagnosis not present

## 2018-04-26 DIAGNOSIS — D631 Anemia in chronic kidney disease: Secondary | ICD-10-CM | POA: Diagnosis present

## 2018-04-26 DIAGNOSIS — Z8249 Family history of ischemic heart disease and other diseases of the circulatory system: Secondary | ICD-10-CM | POA: Diagnosis not present

## 2018-04-26 LAB — CBC
HCT: 26.9 % — ABNORMAL LOW (ref 36.0–46.0)
Hemoglobin: 8.5 g/dL — ABNORMAL LOW (ref 12.0–15.0)
MCH: 30.6 pg (ref 26.0–34.0)
MCHC: 31.6 g/dL (ref 30.0–36.0)
MCV: 96.8 fL (ref 78.0–100.0)
PLATELETS: 130 10*3/uL — AB (ref 150–400)
RBC: 2.78 MIL/uL — AB (ref 3.87–5.11)
RDW: 14.2 % (ref 11.5–15.5)
WBC: 11.7 10*3/uL — ABNORMAL HIGH (ref 4.0–10.5)

## 2018-04-26 LAB — BASIC METABOLIC PANEL
Anion gap: 7 (ref 5–15)
BUN: 26 mg/dL — AB (ref 8–23)
CO2: 24 mmol/L (ref 22–32)
CREATININE: 1.7 mg/dL — AB (ref 0.44–1.00)
Calcium: 7.7 mg/dL — ABNORMAL LOW (ref 8.9–10.3)
Chloride: 107 mmol/L (ref 98–111)
GFR calc Af Amer: 32 mL/min — ABNORMAL LOW (ref >60)
GFR, EST NON AFRICAN AMERICAN: 27 mL/min — AB (ref >60)
Glucose, Bld: 111 mg/dL — ABNORMAL HIGH (ref 70–99)
Potassium: 3.8 mmol/L (ref 3.5–5.1)
SODIUM: 138 mmol/L (ref 135–145)

## 2018-04-26 MED ORDER — CEPHALEXIN 500 MG PO CAPS: 500.0000 mg | ORAL_CAPSULE | Freq: Three times a day (TID) | ORAL | 0 refills | Status: DC

## 2018-04-26 MED ORDER — HYDROCODONE-ACETAMINOPHEN 10-325 MG PO TABS: 1.0000 | ORAL_TABLET | Freq: Four times a day (QID) | ORAL | 0 refills | Status: DC | PRN

## 2018-04-26 MED ORDER — ENOXAPARIN SODIUM 30 MG/0.3ML ~~LOC~~ SOLN
30.0000 mg | Freq: Every day | SUBCUTANEOUS | Status: DC
  Administered 2018-04-26: 30 mg via SUBCUTANEOUS
  Filled 2018-04-26: qty 0.3

## 2018-04-26 MED ORDER — HYDRALAZINE HCL 50 MG PO TABS
50.0000 mg | ORAL_TABLET | Freq: Three times a day (TID) | ORAL | Status: DC
  Administered 2018-04-26: 50 mg via ORAL
  Filled 2018-04-26: qty 1

## 2018-04-26 MED ORDER — ENOXAPARIN SODIUM 30 MG/0.3ML ~~LOC~~ SOLN: 30.0000 mg | Freq: Every day | SUBCUTANEOUS | Status: DC

## 2018-04-26 MED ORDER — HYDRALAZINE HCL 50 MG PO TABS: 50.0000 mg | ORAL_TABLET | Freq: Three times a day (TID) | ORAL | Status: DC

## 2018-04-26 NOTE — Discharge Summary (Signed)
ARYANNAH MOHON FWY:[id number] DOB: 09-26-1936 DOA: 04/22/2018  PCP: [name], MD  Admit date: 04/22/2018  Discharge date: 04/26/2018  Admitted From: Home  Disposition:  SNF   Recommendations for Outpatient Follow-up:   Follow up with PCP in 1-2 weeks  PCP Please obtain BMP/CBC, 2 view CXR in 1week,  (see Discharge instructions)   PCP Please follow up on the following pending results:    Home Health: [facility]   Equipment/Devices: None  Consultations: Ortho Discharge Condition: Stable   CODE STATUS: Full   Diet Recommendation: Low carbohydrate heart healthy diet   Chief Complaint  Patient presents with  . Fall     Brief history of present illness from the day of admission and additional interim summary    82 y.o.femalewith medical history significant for but not limited to advanced dementia, diabetes and CKD presenting with lefthip fracture Secondary to mechanical fall after her walker was caught in a rug, admitted on 04/23/18,  She underwent left hip hemiarthroplasty by orthopedist Dr. Erlinda Hong on 04/23/18                                                                   Hospital Course    1)Citrobacter Koseri  UTI--- treated with IV Rocephin last dose 04/25/2018, now switched to Keflex with a stop date of 04/28/2018.  Clinically stable.    2)Toxic metabolic encephalopathy----secondary to Citrobacter UTI as above #1 superimposed on underlying advanced dementia,  much better and close to her baseline now.  3)Sheehan syndrome-hemodynamically stable, continue hydrocortisone  4)DM2-no recent A1c available, per report this is diet-controlled, monitor CBGs QA CHS at SNF.  5) mild ARF on CKD 3.  Creatinine peaked at 1.9, admission and discharge creatinine is 1.7 this could be her baseline, have discontinued  ARB.  PCP to monitor.  6)Dementia--- at baseline patient has advanced dementia with significant cognitive deficits, patient does not even remember her kids,  does not recognize her grandkids either  7)PAD-stable, continue Crestor and Aggrenox  8)HTN-blood pressure slightly elevated this morning on combination of atenolol and Cardizem will add hydralazine as ARB has been discontinued due to #5 above, monitor and adjust.  9)Lt Hip Fx-  left hip hemiarthroplasty by orthopedist Dr. Erlinda Hong on 04/23/18, WBAT, Lovenox for DVT prophylaxis.  Continue PT needs outpatient follow-up with orthopedics in the next 10 to 14 days post discharge.  Will require SNF.    Discharge diagnosis     Principal Problem:   Femur fracture, left (HCC) Active Problems:   Sheehan syndrome (HCC)   Hypertension   Diabetes mellitus ([place])   Acute renal failure (HCC)   CKD (chronic kidney disease) stage 3, GFR 30-59 ml/min (HCC)   Hypothyroidism   Hip fracture (HCC)   Acute cystitis without hematuria  Discharge instructions    Discharge Instructions    Diet - low sodium heart healthy   Complete by:  As directed    Discharge instructions   Complete by:  As directed    Follow with Primary MD [name], MD in 7 days   Get CBC, CMP,  checked  by Primary MD or SNF MD in 5-7 days   Activity: As tolerated with Full fall precautions use walker/cane & assistance as needed  Disposition SNF  Diet:   Carb modified heart healthy, check CBGs QA CHS.  For Heart failure patients - Check your Weight same time everyday, if you gain over 2 pounds, or you develop in leg swelling, experience more shortness of breath or chest pain, call your Primary MD immediately. Follow Cardiac Low Salt Diet and 1.5 lit/day fluid restriction.  Special Instructions: If you have smoked or chewed Tobacco  in the last 2 yrs please stop smoking, stop any regular Alcohol  and or any Recreational drug use.  On your next visit with your  primary care physician please Get Medicines reviewed and adjusted.  Please request your Prim.MD to go over all Hospital Tests and Procedure/Radiological results at the follow up, please get all Hospital records sent to your Prim MD by signing hospital release before you go home.  If you experience worsening of your admission symptoms, develop shortness of breath, life threatening emergency, suicidal or homicidal thoughts you must seek medical attention immediately by calling 911 or calling your MD immediately  if symptoms less severe.  You Must read complete instructions/literature along with all the possible adverse reactions/side effects for all the Medicines you take and that have been prescribed to you. Take any new Medicines after you have completely understood and accpet all the possible adverse reactions/side effects.   Increase activity slowly   Complete by:  As directed    Weight bearing as tolerated   Complete by:  As directed       Discharge Medications   Allergies as of 04/26/2018      Reactions   Codeine Nausea Only   Dilaudid [hydromorphone Hcl] Nausea Only   Lincomycin Hcl Other (See Comments)   Big bumps bilateral legs   Macrodantin Hives   Zolpidem Tartrate Other (See Comments)   Stroke   Azithromycin Rash   Penicillins Rash   Has patient had a PCN reaction causing immediate rash, facial/tongue/throat swelling, SOB or lightheadedness with hypotension: Yes Has patient had a PCN reaction causing severe rash involving mucus membranes or skin necrosis: No Has patient had a PCN reaction that required hospitalization: No Has patient had a PCN reaction occurring within the last 10 years: No If all of the above answers are "NO", then may proceed with Cephalosporin use.      Medication List    STOP taking these medications   losartan 100 MG tablet Commonly known as:  COZAAR   oxyCODONE-acetaminophen 5-325 MG tablet Commonly known as:  PERCOCET/ROXICET     TAKE these  medications   acetaminophen 500 MG tablet Commonly known as:  TYLENOL Take 500 mg by mouth daily as needed for headache.   amitriptyline 25 MG tablet Commonly known as:  ELAVIL Take 25 mg by mouth at bedtime.   atenolol 100 MG tablet Commonly known as:  TENORMIN Take 100 mg by mouth at bedtime.   CALCIUM-D PO Take 1 tablet by mouth daily.   cephALEXin 500 MG capsule Commonly known as:  KEFLEX Take 1 capsule (500  mg total) by mouth 3 (three) times daily for 2 days. Total of 2 more days   diltiazem 240 MG 24 hr capsule Commonly known as:  CARDIZEM CD Take 240 mg by mouth every evening.   dipyridamole-aspirin 200-25 MG 12hr capsule Commonly known as:  AGGRENOX Take 1 capsule by mouth 2 (two) times daily.   enoxaparin 30 MG/0.3ML injection Commonly known as:  LOVENOX Inject 0.3 mLs (30 mg total) into the skin daily for 20 days. Start taking on:  04/27/2018   FISH OIL PO Take 1 capsule by mouth daily.   glucose monitoring kit monitoring kit 1 each by Does not apply route as needed. Reported on 10/29/2015   hydrALAZINE 50 MG tablet Commonly known as:  APRESOLINE Take 1 tablet (50 mg total) by mouth every 8 (eight) hours.   HYDROcodone-acetaminophen 10-325 MG tablet Commonly known as:  NORCO Take 1 tablet by mouth every 6 (six) hours as needed for up to 5 days (pain). What changed:  when to take this   hydrocortisone 10 MG tablet Commonly known as:  CORTEF Take 5-15 mg by mouth See admin instructions. Take 1 1/2 tablets (15 mg) by mouth every morning and 1/2 tablet (5 mg) at bedtime   levothyroxine 75 MCG tablet Commonly known as:  SYNTHROID, LEVOTHROID Take 75 mcg by mouth daily before breakfast.   memantine 10 MG tablet Commonly known as:  NAMENDA Take 10 mg by mouth 2 (two) times daily.   multivitamin with minerals Tabs tablet Take 1 tablet by mouth daily.   omeprazole 40 MG capsule Commonly known as:  PRILOSEC Take 40 mg by mouth 2 (two) times daily.     potassium citrate 10 MEQ (1080 MG) SR tablet Commonly known as:  UROCIT-K Take 20 mEq by mouth daily.   rosuvastatin 5 MG tablet Commonly known as:  CRESTOR Take 5 mg by mouth at bedtime.            Discharge Care Instructions  (From admission, onward)        Start     Ordered   04/23/18 0000  Weight bearing as tolerated     04/23/18 1410       Contact information for follow-up providers    Leandrew Koyanagi, MD In 2 weeks.   Specialty:  Orthopedic Surgery Why:  For suture removal, For wound re-check Contact information: [place] [id number] [phone]            Contact information for after-discharge care    Destination    [facility] .   Service:  Skilled Nursing Contact information: [address] [place] [phone]                  Major procedures and Radiology Reports - PLEASE review detailed and final reports thoroughly  -         Dg Ribs Unilateral W/chest Left  Result Date: 04/23/2018 CLINICAL DATA:  Left axillary pain after fall. EXAM: LEFT RIBS AND CHEST - 3+ VIEW COMPARISON:  On 04/23/2013 FINDINGS: Remote left posterolateral sixth rib fracture. Cardiomegaly with moderate to marked aortic atherosclerosis with uncoiling of the thoracic aorta. No aneurysm. Atelectasis is identified at the left lung base. No pneumothorax or pulmonary contusions. Lumbar spinal fusion L3 through L5 with interbody blocks in place. Chronic L2 compression deformity with moderate 50% height loss. Inferior endplate compression of T8 with 50% height loss is also noted. These appear chronic and  stable. IMPRESSION: 1. Remote left sixth rib fracture. No acute displaced rib fracture is currently noted. 2. Cardiomegaly with aortic atherosclerosis. 3. Thoracolumbar spondylosis with lower lumbar spinal fusion and chronic compression fractures of T8 and L2. Electronically Signed   By: Ashley Royalty M.D.    On: 04/23/2018 00:21   Ct Head Wo Contrast  Result Date: 04/24/2018 CLINICAL DATA:  Altered mental status, baseline dementia. Recent hip fracture with repair. EXAM: CT HEAD WITHOUT CONTRAST TECHNIQUE: Contiguous axial images were obtained from the base of the skull through the vertex without intravenous contrast. COMPARISON:  CT head 04/01/2012.  MR head 12/08/2011. FINDINGS: The patient was unable to remain motionless for the exam. Small or subtle lesions could be overlooked. Brain: No evidence for acute infarction, hemorrhage, mass lesion, hydrocephalus, or extra-axial fluid. Advanced atrophy. Extensive chronic microvascular ischemic change. Chronic LEFT occipital infarct. Vascular: Calcification of the cavernous internal carotid arteries consistent with cerebrovascular atherosclerotic disease. No signs of intracranial large vessel occlusion. Skull: No fracture or osseous lesion. Sinuses/Orbits: No significant opacity or layering fluid. Negative orbits. Other: LEFT carotid stent. IMPRESSION: Motion degraded exam demonstrating advanced atrophy and extensive small vessel disease. Old LEFT hemisphere infarct, evidence for previous LEFT carotid surgery. No acute intracranial findings. Electronically Signed   By: Staci Righter M.D.   On: 04/24/2018 13:24   Pelvis Portable  Result Date: 04/23/2018 CLINICAL DATA:  Anterior left hip hemiarthroplasty. EXAM: PORTABLE PELVIS 1-2 VIEWS COMPARISON:  04/22/2018 FINDINGS: Right hip arthroplasty unchanged. Interval placement of left hip arthroplasty intact and normally located. Mild diffuse osteopenia. Remainder of the exam is unchanged. IMPRESSION: Interval left hip arthroplasty intact. Electronically Signed   By: Marin Olp M.D.   On: 04/23/2018 16:27   Dg C-arm 1-60 Min  Result Date: 04/23/2018 CLINICAL DATA:  Left hip arthroplasty. EXAM: OPERATIVE left HIP (WITH PELVIS IF PERFORMED)  VIEWS TECHNIQUE: Fluoroscopic spot image(s) were submitted for interpretation  post-operatively. COMPARISON:  04/22/2018 FINDINGS: There has been interval placement of a left hip arthroplasty in adequate position normally located. Partially visualized right hip arthroplasty is present. Remainder the exam is unchanged. IMPRESSION: Placement of left hip arthroplasty intact. Electronically Signed   By: Marin Olp M.D.   On: 04/23/2018 15:01   Dg Hip Operative Unilat W Or W/o Pelvis Left  Result Date: 04/23/2018 CLINICAL DATA:  Left hip arthroplasty. EXAM: OPERATIVE left HIP (WITH PELVIS IF PERFORMED)  VIEWS TECHNIQUE: Fluoroscopic spot image(s) were submitted for interpretation post-operatively. COMPARISON:  04/22/2018 FINDINGS: There has been interval placement of a left hip arthroplasty in adequate position normally located. Partially visualized right hip arthroplasty is present. Remainder the exam is unchanged. IMPRESSION: Placement of left hip arthroplasty intact. Electronically Signed   By: Marin Olp M.D.   On: 04/23/2018 15:01   Dg Hip Unilat W Or Wo Pelvis 2-3 Views Left  Result Date: 04/23/2018 CLINICAL DATA:  Left hip pain after fall. EXAM: DG HIP (WITH OR WITHOUT PELVIS) 2-3V LEFT COMPARISON:  None. FINDINGS: An acute, varus angulated and slightly impacted left transcervical femoral neck fracture is identified. Lower lumbar spinal fusion hardware appears intact. The bony pelvis and pubic rami are intact. No acetabular fracture is noted. Partially included uncemented right bipolar hip arthroplasty is in place without complicating features. Dense bi-iliofemoral atherosclerosis. IMPRESSION: Acute varus angulated transcervical fracture of the left femur with mild impaction of the femoral neck upon the opposing femoral neck. Electronically Signed   By: Ashley Royalty M.D.   On: 04/23/2018 00:26  Micro Results     Recent Results (from the past 240 hour(s))  Urine culture     Status: Abnormal   Collection Time: 04/23/18 12:36 AM  Result Value Ref Range Status   Specimen  Description   Final    URINE, CLEAN CATCH Performed at [facility], [address], [place], [place] [address]    Special Requests   Final    Normal Performed at [facility], [address] [place], [place] [address]    Culture >=100,000 COLONIES/mL CITROBACTER KOSERI (A)  Final   Report Status 04/25/2018 FINAL  Final   Organism ID, Bacteria CITROBACTER KOSERI (A)  Final      Susceptibility   Citrobacter koseri - MIC*    CEFAZOLIN <=4 SENSITIVE Sensitive     CEFTRIAXONE <=1 SENSITIVE Sensitive     CIPROFLOXACIN <=0.25 SENSITIVE Sensitive     GENTAMICIN <=1 SENSITIVE Sensitive     IMIPENEM <=0.25 SENSITIVE Sensitive     NITROFURANTOIN 32 SENSITIVE Sensitive     TRIMETH/SULFA <=20 SENSITIVE Sensitive     PIP/TAZO <=4 SENSITIVE Sensitive     * >=100,000 COLONIES/mL CITROBACTER KOSERI  Surgical pcr screen     Status: Abnormal   Collection Time: 04/23/18  6:57 AM  Result Value Ref Range Status   MRSA, PCR NEGATIVE NEGATIVE Final   Staphylococcus aureus POSITIVE (A) NEGATIVE Final    Comment: (NOTE) The Xpert SA Assay (FDA approved for NASAL specimens in patients 16 years of age and older), is one component of a comprehensive surveillance program. It is not intended to diagnose infection nor to guide or monitor treatment.   Culture, Urine     Status: None   Collection Time: 04/24/18 11:28 AM  Result Value Ref Range Status   Specimen Description URINE, RANDOM  Final   Special Requests NONE  Final   Culture   Final    NO GROWTH Performed at [facility], [address], [place], [place] [address]    Report Status 04/25/2018 FINAL  Final    Today   Subjective    Drea Spurrier today has no headache,no chest abdominal pain,no new weakness tingling or numbness, feels much better wants to go home today.     Objective   Blood pressure 180/70, pulse 72, temperature 98.5 F (36.9 C), resp. rate 15, height 5' 4"  (1.626 m), weight 69.2 kg (152 lb 8.9 oz), SpO2 99  %.   Intake/Output Summary (Last 24 hours) at 04/26/2018 1139 Last data filed at 04/26/2018 0900 Gross per 24 hour  Intake 3379.15 ml  Output 1405 ml  Net 1974.15 ml    Exam Awake Alert, Oriented x 3, No new F.N deficits, Normal affect [place].AT,PERRAL Supple Neck,No JVD, No cervical lymphadenopathy appriciated.  Symmetrical Chest wall movement, Good air movement bilaterally, CTAB RRR,No Gallops,Rubs or new Murmurs, No Parasternal Heave +ve B.Sounds, Abd Soft, Non tender, No organomegaly appriciated, No rebound -guarding or rigidity. No Cyanosis, Clubbing or edema, No new Rash or bruise, left hip postop site appears stable   Data Review   CBC w Diff:  Lab Results  Component Value Date   WBC 11.7 (H) 04/26/2018   HGB 8.5 (L) 04/26/2018   HCT 26.9 (L) 04/26/2018   PLT 130 (L) 04/26/2018   LYMPHOPCT 6 04/24/2018   MONOPCT 10 04/24/2018   EOSPCT 1 04/24/2018   BASOPCT 0 04/24/2018    CMP:  Lab Results  Component Value Date   NA 138 04/26/2018   K 3.8  04/26/2018   CL 107 04/26/2018   CO2 24 04/26/2018   BUN 26 (H) 04/26/2018   CREATININE 1.70 (H) 04/26/2018   CREATININE 2.17 (H) 11/11/2011   PROT 5.1 (L) 04/25/2018   ALBUMIN 2.3 (L) 04/25/2018   BILITOT 0.4 04/25/2018   ALKPHOS 43 04/25/2018   AST 26 04/25/2018   ALT <5 04/25/2018  .   Total Time in preparing paper work, data evaluation and todays exam - 61 minutes  Lala Lund M.D on 04/26/2018 at 11:39 AM  [facility]   Office  [phone]

## 2018-04-26 NOTE — Social Work (Signed)
CSW spoke with pt husband at bedside, pt sleeping did not arouse for visit.   Pt family have chosen [facility] for placement. Facility able to take pt.  Will page MD for discharge summary. Hard script on chart.   Alexander Mt, [place] Work [phone]

## 2018-04-26 NOTE — Social Work (Signed)
Clinical Social Worker facilitated patient discharge including contacting patient family and facility to confirm patient discharge plans.  Clinical information faxed to facility and family agreeable with plan.  CSW arranged ambulance transport via [place] to [facility].  RN to call [phone] with report  prior to discharge.  Clinical Social Worker will sign off for now as social work intervention is no longer needed. Please consult [place] again if new need arises.  Alexander Mt, [place] Social Worker [phone]

## 2018-04-26 NOTE — Clinical Social Work Placement (Signed)
   CLINICAL SOCIAL WORK PLACEMENT  NOTE [facility]  (formerly [facility])  Date:  04/26/2018  Patient Details  Name: Katie Pace MRN: [id number] Date of Birth: 07/19/1936  Clinical Social Work is seeking post-discharge placement for this patient at the [place] level of care (*CSW will initial, date and re-position this form in  chart as items are completed):  Yes   Patient/family provided with [place] Work Department's list of facilities offering this level of care within the geographic area requested by the patient (or if unable, by the patient's family).  Yes   Patient/family informed of their freedom to choose among providers that offer the needed level of care, that participate in Medicare, Medicaid or managed care program needed by the patient, have an available bed and are willing to accept the patient.  Yes   Patient/family informed of 's ownership interest in [facility] and [facility], as well as of the fact that they are under no obligation to receive care at these facilities.  PASRR submitted to EDS on       PASRR number received on       Existing PASRR number confirmed on 04/25/18     FL2 transmitted to all facilities in geographic area requested by pt/family on 04/25/18     FL2 transmitted to all facilities within larger geographic area on       Patient informed that his/her managed care company has contracts with or will negotiate with certain facilities, including the following:        Yes   Patient/family informed of bed offers received.  Patient chooses bed at [place] [facility]     Physician recommends and patient chooses bed at      Patient to be transferred to [facility] on 04/26/18.  Patient to be transferred to facility by [facility]     Patient family notified on 04/26/18 of transfer.  Name of family member notified:  spouse and son      PHYSICIAN        Additional Comment:    _______________________________________________ Alexander Mt, [place] 04/26/2018, 12:05 PM

## 2018-04-26 NOTE — Discharge Instructions (Signed)
Follow with Primary MD [name], MD in 7 days   Get CBC, CMP,  checked  by Primary MD or SNF MD in 5-7 days   Activity: As tolerated with Full fall precautions use walker/cane & assistance as needed  Disposition SNF  Diet:   Carb modified heart healthy, check CBGs QA CHS.  For Heart failure patients - Check your Weight same time everyday, if you gain over 2 pounds, or you develop in leg swelling, experience more shortness of breath or chest pain, call your Primary MD immediately. Follow Cardiac Low Salt Diet and 1.5 lit/day fluid restriction.  Special Instructions: If you have smoked or chewed Tobacco  in the last 2 yrs please stop smoking, stop any regular Alcohol  and or any Recreational drug use.  On your next visit with your primary care physician please Get Medicines reviewed and adjusted.  Please request your Prim.MD to go over all Hospital Tests and Procedure/Radiological results at the follow up, please get all Hospital records sent to your Prim MD by signing hospital release before you go home.  If you experience worsening of your admission symptoms, develop shortness of breath, life threatening emergency, suicidal or homicidal thoughts you must seek medical attention immediately by calling 911 or calling your MD immediately  if symptoms less severe.  You Must read complete instructions/literature along with all the possible adverse reactions/side effects for all the Medicines you take and that have been prescribed to you. Take any new Medicines after you have completely understood and accpet all the possible adverse reactions/side effects.             1. Change dressings as needed 2. May shower but keep incisions covered and dry 3. Take aggrenox to prevent blood clots 4. Take stool softeners as needed 5. Take pain meds as needed

## 2018-04-26 NOTE — Progress Notes (Signed)
Report called to Child psychotherapist, Therapist, sports at [facility]. IV removed and purewick removed. VSS. No belongings at bedside. Pt discharged with [facility] and husband at bedside.

## 2018-04-26 NOTE — Care Management Important Message (Signed)
Important Message  Patient Details  Name: Katie Pace MRN: [id number] Date of Birth: [birth year]   Medicare Important Message Given:  Yes    Noella Kipnis 04/26/2018, 2:35 PM

## 2018-04-27 DIAGNOSIS — N390 Urinary tract infection, site not specified: Secondary | ICD-10-CM | POA: Diagnosis not present

## 2018-04-27 DIAGNOSIS — I10 Essential (primary) hypertension: Secondary | ICD-10-CM | POA: Diagnosis not present

## 2018-04-27 DIAGNOSIS — E230 Hypopituitarism: Secondary | ICD-10-CM | POA: Diagnosis not present

## 2018-04-27 DIAGNOSIS — Z8781 Personal history of (healed) traumatic fracture: Secondary | ICD-10-CM | POA: Diagnosis not present

## 2018-04-28 ENCOUNTER — Other Ambulatory Visit: Payer: Self-pay

## 2018-04-28 ENCOUNTER — Inpatient Hospital Stay (HOSPITAL_COMMUNITY)
Admission: EM | Admit: 2018-04-28 | Discharge: 2018-05-10 | DRG: 871 | Disposition: A | Discharge status: Skilled Nursing Facility | Payer: Medicare Other | Attending: Family Medicine | Admitting: Family Medicine

## 2018-04-28 ENCOUNTER — Emergency Department (HOSPITAL_COMMUNITY): Payer: Medicare Other

## 2018-04-28 ENCOUNTER — Encounter (HOSPITAL_COMMUNITY): Payer: Self-pay | Admitting: Emergency Medicine

## 2018-04-28 DIAGNOSIS — E8770 Fluid overload, unspecified: Secondary | ICD-10-CM | POA: Diagnosis not present

## 2018-04-28 DIAGNOSIS — M2550 Pain in unspecified joint: Secondary | ICD-10-CM | POA: Diagnosis not present

## 2018-04-28 DIAGNOSIS — T8144XA Sepsis following a procedure, initial encounter: Secondary | ICD-10-CM

## 2018-04-28 DIAGNOSIS — N184 Chronic kidney disease, stage 4 (severe): Secondary | ICD-10-CM | POA: Diagnosis not present

## 2018-04-28 DIAGNOSIS — R062 Wheezing: Secondary | ICD-10-CM

## 2018-04-28 DIAGNOSIS — M96662 Fracture of femur following insertion of orthopedic implant, joint prosthesis, or bone plate, left leg: Secondary | ICD-10-CM | POA: Diagnosis not present

## 2018-04-28 DIAGNOSIS — A419 Sepsis, unspecified organism: Principal | ICD-10-CM

## 2018-04-28 DIAGNOSIS — J984 Other disorders of lung: Secondary | ICD-10-CM | POA: Diagnosis not present

## 2018-04-28 DIAGNOSIS — L03115 Cellulitis of right lower limb: Secondary | ICD-10-CM

## 2018-04-28 DIAGNOSIS — E876 Hypokalemia: Secondary | ICD-10-CM | POA: Diagnosis not present

## 2018-04-28 DIAGNOSIS — L8990 Pressure ulcer of unspecified site, unspecified stage: Secondary | ICD-10-CM

## 2018-04-28 DIAGNOSIS — E119 Type 2 diabetes mellitus without complications: Secondary | ICD-10-CM

## 2018-04-28 DIAGNOSIS — R488 Other symbolic dysfunctions: Secondary | ICD-10-CM | POA: Diagnosis not present

## 2018-04-28 DIAGNOSIS — Z79890 Hormone replacement therapy (postmenopausal): Secondary | ICD-10-CM

## 2018-04-28 DIAGNOSIS — Z8673 Personal history of transient ischemic attack (TIA), and cerebral infarction without residual deficits: Secondary | ICD-10-CM

## 2018-04-28 DIAGNOSIS — J189 Pneumonia, unspecified organism: Secondary | ICD-10-CM

## 2018-04-28 DIAGNOSIS — I10 Essential (primary) hypertension: Secondary | ICD-10-CM | POA: Diagnosis not present

## 2018-04-28 DIAGNOSIS — S72002D Fracture of unspecified part of neck of left femur, subsequent encounter for closed fracture with routine healing: Secondary | ICD-10-CM | POA: Diagnosis not present

## 2018-04-28 DIAGNOSIS — I700 Atherosclerosis of aorta: Secondary | ICD-10-CM | POA: Diagnosis not present

## 2018-04-28 DIAGNOSIS — E871 Hypo-osmolality and hyponatremia: Secondary | ICD-10-CM | POA: Diagnosis not present

## 2018-04-28 DIAGNOSIS — N990 Postprocedural (acute) (chronic) kidney failure: Secondary | ICD-10-CM | POA: Diagnosis not present

## 2018-04-28 DIAGNOSIS — Z87891 Personal history of nicotine dependence: Secondary | ICD-10-CM | POA: Diagnosis not present

## 2018-04-28 DIAGNOSIS — T148XXA Other injury of unspecified body region, initial encounter: Secondary | ICD-10-CM

## 2018-04-28 DIAGNOSIS — D631 Anemia in chronic kidney disease: Secondary | ICD-10-CM | POA: Diagnosis present

## 2018-04-28 DIAGNOSIS — K219 Gastro-esophageal reflux disease without esophagitis: Secondary | ICD-10-CM | POA: Diagnosis not present

## 2018-04-28 DIAGNOSIS — R262 Difficulty in walking, not elsewhere classified: Secondary | ICD-10-CM | POA: Diagnosis not present

## 2018-04-28 DIAGNOSIS — Z96642 Presence of left artificial hip joint: Secondary | ICD-10-CM | POA: Diagnosis not present

## 2018-04-28 DIAGNOSIS — Z471 Aftercare following joint replacement surgery: Secondary | ICD-10-CM | POA: Diagnosis not present

## 2018-04-28 DIAGNOSIS — D649 Anemia, unspecified: Secondary | ICD-10-CM | POA: Diagnosis not present

## 2018-04-28 DIAGNOSIS — G9341 Metabolic encephalopathy: Secondary | ICD-10-CM | POA: Diagnosis present

## 2018-04-28 DIAGNOSIS — Z96651 Presence of right artificial knee joint: Secondary | ICD-10-CM | POA: Diagnosis present

## 2018-04-28 DIAGNOSIS — M6281 Muscle weakness (generalized): Secondary | ICD-10-CM | POA: Diagnosis not present

## 2018-04-28 DIAGNOSIS — D500 Iron deficiency anemia secondary to blood loss (chronic): Secondary | ICD-10-CM | POA: Diagnosis not present

## 2018-04-28 DIAGNOSIS — Z96641 Presence of right artificial hip joint: Secondary | ICD-10-CM | POA: Diagnosis present

## 2018-04-28 DIAGNOSIS — L03116 Cellulitis of left lower limb: Secondary | ICD-10-CM | POA: Diagnosis not present

## 2018-04-28 DIAGNOSIS — E039 Hypothyroidism, unspecified: Secondary | ICD-10-CM | POA: Diagnosis present

## 2018-04-28 DIAGNOSIS — E118 Type 2 diabetes mellitus with unspecified complications: Secondary | ICD-10-CM | POA: Diagnosis not present

## 2018-04-28 DIAGNOSIS — F0390 Unspecified dementia without behavioral disturbance: Secondary | ICD-10-CM | POA: Diagnosis present

## 2018-04-28 DIAGNOSIS — R109 Unspecified abdominal pain: Secondary | ICD-10-CM | POA: Diagnosis not present

## 2018-04-28 DIAGNOSIS — R0602 Shortness of breath: Secondary | ICD-10-CM

## 2018-04-28 DIAGNOSIS — D62 Acute posthemorrhagic anemia: Secondary | ICD-10-CM | POA: Diagnosis not present

## 2018-04-28 DIAGNOSIS — E1122 Type 2 diabetes mellitus with diabetic chronic kidney disease: Secondary | ICD-10-CM | POA: Diagnosis not present

## 2018-04-28 DIAGNOSIS — J9691 Respiratory failure, unspecified with hypoxia: Secondary | ICD-10-CM | POA: Diagnosis not present

## 2018-04-28 DIAGNOSIS — R6521 Severe sepsis with septic shock: Secondary | ICD-10-CM

## 2018-04-28 DIAGNOSIS — R0689 Other abnormalities of breathing: Secondary | ICD-10-CM

## 2018-04-28 DIAGNOSIS — E230 Hypopituitarism: Secondary | ICD-10-CM | POA: Diagnosis not present

## 2018-04-28 DIAGNOSIS — N390 Urinary tract infection, site not specified: Secondary | ICD-10-CM

## 2018-04-28 DIAGNOSIS — Z8249 Family history of ischemic heart disease and other diseases of the circulatory system: Secondary | ICD-10-CM | POA: Diagnosis not present

## 2018-04-28 DIAGNOSIS — L0390 Cellulitis, unspecified: Secondary | ICD-10-CM | POA: Diagnosis not present

## 2018-04-28 DIAGNOSIS — E873 Alkalosis: Secondary | ICD-10-CM | POA: Diagnosis not present

## 2018-04-28 DIAGNOSIS — E038 Other specified hypothyroidism: Secondary | ICD-10-CM | POA: Diagnosis not present

## 2018-04-28 DIAGNOSIS — Z79899 Other long term (current) drug therapy: Secondary | ICD-10-CM

## 2018-04-28 DIAGNOSIS — R1312 Dysphagia, oropharyngeal phase: Secondary | ICD-10-CM | POA: Diagnosis not present

## 2018-04-28 DIAGNOSIS — Z7401 Bed confinement status: Secondary | ICD-10-CM | POA: Diagnosis not present

## 2018-04-28 DIAGNOSIS — R140 Abdominal distension (gaseous): Secondary | ICD-10-CM | POA: Diagnosis not present

## 2018-04-28 DIAGNOSIS — T17908A Unspecified foreign body in respiratory tract, part unspecified causing other injury, initial encounter: Secondary | ICD-10-CM

## 2018-04-28 DIAGNOSIS — R0603 Acute respiratory distress: Secondary | ICD-10-CM

## 2018-04-28 DIAGNOSIS — E1151 Type 2 diabetes mellitus with diabetic peripheral angiopathy without gangrene: Secondary | ICD-10-CM | POA: Diagnosis not present

## 2018-04-28 DIAGNOSIS — I129 Hypertensive chronic kidney disease with stage 1 through stage 4 chronic kidney disease, or unspecified chronic kidney disease: Secondary | ICD-10-CM | POA: Diagnosis present

## 2018-04-28 DIAGNOSIS — R1904 Left lower quadrant abdominal swelling, mass and lump: Secondary | ICD-10-CM | POA: Diagnosis not present

## 2018-04-28 DIAGNOSIS — J90 Pleural effusion, not elsewhere classified: Secondary | ICD-10-CM | POA: Diagnosis not present

## 2018-04-28 DIAGNOSIS — R4182 Altered mental status, unspecified: Secondary | ICD-10-CM | POA: Diagnosis not present

## 2018-04-28 LAB — CBC
HEMATOCRIT: 29 % — AB (ref 36.0–46.0)
Hemoglobin: 8.7 g/dL — ABNORMAL LOW (ref 12.0–15.0)
MCH: 29.8 pg (ref 26.0–34.0)
MCHC: 30 g/dL (ref 30.0–36.0)
MCV: 99.3 fL (ref 78.0–100.0)
PLATELETS: 173 10*3/uL (ref 150–400)
RBC: 2.92 MIL/uL — ABNORMAL LOW (ref 3.87–5.11)
RDW: 15 % (ref 11.5–15.5)
WBC: 10.4 10*3/uL (ref 4.0–10.5)

## 2018-04-28 LAB — URINALYSIS, ROUTINE W REFLEX MICROSCOPIC
BILIRUBIN URINE: NEGATIVE
GLUCOSE, UA: NEGATIVE mg/dL
HGB URINE DIPSTICK: NEGATIVE
Ketones, ur: 5 mg/dL — AB
LEUKOCYTES UA: NEGATIVE
NITRITE: NEGATIVE
Protein, ur: 100 mg/dL — AB
Specific Gravity, Urine: 1.018 (ref 1.005–1.030)
pH: 5 (ref 5.0–8.0)

## 2018-04-28 LAB — COMPREHENSIVE METABOLIC PANEL
ALBUMIN: 2.3 g/dL — AB (ref 3.5–5.0)
ALK PHOS: 56 U/L (ref 38–126)
ALT: 5 U/L (ref 0–44)
AST: 24 U/L (ref 15–41)
Anion gap: 8 (ref 5–15)
BILIRUBIN TOTAL: 0.7 mg/dL (ref 0.3–1.2)
BUN: 34 mg/dL — ABNORMAL HIGH (ref 8–23)
CO2: 24 mmol/L (ref 22–32)
CREATININE: 1.9 mg/dL — AB (ref 0.44–1.00)
Calcium: 7.9 mg/dL — ABNORMAL LOW (ref 8.9–10.3)
Chloride: 102 mmol/L (ref 98–111)
GFR calc Af Amer: 28 mL/min — ABNORMAL LOW (ref >60)
GFR calc non Af Amer: 24 mL/min — ABNORMAL LOW (ref >60)
GLUCOSE: 83 mg/dL (ref 70–99)
Potassium: 4 mmol/L (ref 3.5–5.1)
SODIUM: 134 mmol/L — AB (ref 135–145)
TOTAL PROTEIN: 5.8 g/dL — AB (ref 6.5–8.1)

## 2018-04-28 LAB — CBC WITH DIFFERENTIAL/PLATELET
BASOS ABS: 0.1 10*3/uL (ref 0.0–0.1)
BASOS PCT: 1 %
EOS ABS: 0.4 10*3/uL (ref 0.0–0.7)
EOS PCT: 3 %
HCT: 29.9 % — ABNORMAL LOW (ref 36.0–46.0)
Hemoglobin: 9.4 g/dL — ABNORMAL LOW (ref 12.0–15.0)
Lymphocytes Relative: 11 %
Lymphs Abs: 1.2 10*3/uL (ref 0.7–4.0)
MCH: 30.7 pg (ref 26.0–34.0)
MCHC: 31.4 g/dL (ref 30.0–36.0)
MCV: 97.7 fL (ref 78.0–100.0)
MONO ABS: 1.3 10*3/uL — AB (ref 0.1–1.0)
MONOS PCT: 12 %
Neutro Abs: 8.4 10*3/uL — ABNORMAL HIGH (ref 1.7–7.7)
Neutrophils Relative %: 73 %
PLATELETS: 221 10*3/uL (ref 150–400)
RBC: 3.06 MIL/uL — ABNORMAL LOW (ref 3.87–5.11)
RDW: 15.3 % (ref 11.5–15.5)
WBC: 11.4 10*3/uL — ABNORMAL HIGH (ref 4.0–10.5)

## 2018-04-28 LAB — CREATININE, SERUM
CREATININE: 1.83 mg/dL — AB (ref 0.44–1.00)
GFR, EST AFRICAN AMERICAN: 29 mL/min — AB (ref >60)
GFR, EST NON AFRICAN AMERICAN: 25 mL/min — AB (ref >60)

## 2018-04-28 LAB — GLUCOSE, CAPILLARY: Glucose-Capillary: 103 mg/dL — ABNORMAL HIGH (ref 70–99)

## 2018-04-28 LAB — I-STAT CG4 LACTIC ACID, ED
Lactic Acid, Venous: 0.83 mmol/L (ref 0.5–1.9)
Lactic Acid, Venous: 0.8 mmol/L (ref 0.5–1.9)

## 2018-04-28 LAB — HEMOGLOBIN A1C
HEMOGLOBIN A1C: 6 % — AB (ref 4.8–5.6)
Mean Plasma Glucose: 125.5 mg/dL

## 2018-04-28 MED ORDER — METRONIDAZOLE IN NACL 5-0.79 MG/ML-% IV SOLN
500.0000 mg | Freq: Once | INTRAVENOUS | Status: AC
  Administered 2018-04-28: 500 mg via INTRAVENOUS
  Filled 2018-04-28: qty 100

## 2018-04-28 MED ORDER — PANTOPRAZOLE SODIUM 40 MG PO TBEC
40.0000 mg | DELAYED_RELEASE_TABLET | Freq: Every day | ORAL | Status: DC
  Administered 2018-04-29 – 2018-05-10 (×12): 40 mg via ORAL
  Filled 2018-04-28 (×12): qty 1

## 2018-04-28 MED ORDER — VANCOMYCIN HCL 10 G IV SOLR
1500.0000 mg | Freq: Once | INTRAVENOUS | Status: AC
  Administered 2018-04-28: 1500 mg via INTRAVENOUS
  Filled 2018-04-28: qty 1500

## 2018-04-28 MED ORDER — VANCOMYCIN HCL IN DEXTROSE 750-5 MG/150ML-% IV SOLN
750.0000 mg | INTRAVENOUS | Status: DC
  Administered 2018-04-29 – 2018-05-01 (×3): 750 mg via INTRAVENOUS
  Filled 2018-04-28 (×4): qty 150

## 2018-04-28 MED ORDER — ONDANSETRON HCL 4 MG PO TABS: 4.0000 mg | ORAL_TABLET | Freq: Four times a day (QID) | ORAL | Status: DC | PRN

## 2018-04-28 MED ORDER — SODIUM CHLORIDE 0.9 % IV SOLN
INTRAVENOUS | Status: AC
  Administered 2018-04-28 – 2018-04-29 (×2): via INTRAVENOUS

## 2018-04-28 MED ORDER — DILTIAZEM HCL ER COATED BEADS 240 MG PO CP24
240.0000 mg | ORAL_CAPSULE | Freq: Every evening | ORAL | Status: DC
  Administered 2018-04-28 – 2018-05-05 (×8): 240 mg via ORAL
  Filled 2018-04-28 (×8): qty 1

## 2018-04-28 MED ORDER — HYDROCORTISONE NA SUCCINATE PF 100 MG IJ SOLR
100.0000 mg | Freq: Three times a day (TID) | INTRAMUSCULAR | Status: DC
  Administered 2018-04-28 – 2018-05-01 (×8): 100 mg via INTRAVENOUS
  Filled 2018-04-28 (×8): qty 2

## 2018-04-28 MED ORDER — HYDRALAZINE HCL 50 MG PO TABS
50.0000 mg | ORAL_TABLET | Freq: Three times a day (TID) | ORAL | Status: DC
  Administered 2018-04-28 – 2018-05-05 (×22): 50 mg via ORAL
  Filled 2018-04-28 (×22): qty 1

## 2018-04-28 MED ORDER — INSULIN ASPART 100 UNIT/ML ~~LOC~~ SOLN
0.0000 [IU] | Freq: Three times a day (TID) | SUBCUTANEOUS | Status: DC
  Administered 2018-04-29: 2 [IU] via SUBCUTANEOUS
  Administered 2018-04-29: 3 [IU] via SUBCUTANEOUS
  Administered 2018-04-30: 2 [IU] via SUBCUTANEOUS
  Administered 2018-04-30: 3 [IU] via SUBCUTANEOUS
  Administered 2018-04-30: 2 [IU] via SUBCUTANEOUS
  Administered 2018-05-01: 3 [IU] via SUBCUTANEOUS
  Administered 2018-05-01: 5 [IU] via SUBCUTANEOUS
  Administered 2018-05-01 – 2018-05-05 (×10): 2 [IU] via SUBCUTANEOUS

## 2018-04-28 MED ORDER — INSULIN ASPART 100 UNIT/ML ~~LOC~~ SOLN: 0.0000 [IU] | Freq: Every day | SUBCUTANEOUS | Status: DC

## 2018-04-28 MED ORDER — SODIUM CHLORIDE 0.9 % IV BOLUS (SEPSIS)
250.0000 mL | Freq: Once | INTRAVENOUS | Status: AC
  Administered 2018-04-28: 250 mL via INTRAVENOUS

## 2018-04-28 MED ORDER — SODIUM CHLORIDE 0.9 % IV SOLN: 2.0000 g | INTRAVENOUS | Status: DC

## 2018-04-28 MED ORDER — MORPHINE SULFATE (PF) 2 MG/ML IV SOLN
0.5000 mg | INTRAVENOUS | Status: DC | PRN
  Administered 2018-05-01: 1 mg via INTRAVENOUS
  Filled 2018-04-28: qty 1

## 2018-04-28 MED ORDER — SODIUM CHLORIDE 0.9 % IV BOLUS (SEPSIS)
1000.0000 mL | Freq: Once | INTRAVENOUS | Status: AC
  Administered 2018-04-28: 1000 mL via INTRAVENOUS

## 2018-04-28 MED ORDER — SODIUM CHLORIDE 0.9 % IV SOLN
1.0000 g | INTRAVENOUS | Status: DC
  Administered 2018-04-28 – 2018-05-03 (×6): 1 g via INTRAVENOUS
  Filled 2018-04-28 (×8): qty 10

## 2018-04-28 MED ORDER — ACETAMINOPHEN 325 MG PO TABS
650.0000 mg | ORAL_TABLET | Freq: Four times a day (QID) | ORAL | Status: DC | PRN
  Administered 2018-04-29 – 2018-05-04 (×8): 650 mg via ORAL
  Filled 2018-04-28 (×7): qty 2

## 2018-04-28 MED ORDER — ACETAMINOPHEN 500 MG PO TABS
1000.0000 mg | ORAL_TABLET | Freq: Once | ORAL | Status: AC
  Administered 2018-04-28: 1000 mg via ORAL
  Filled 2018-04-28: qty 2

## 2018-04-28 MED ORDER — SODIUM CHLORIDE 0.9 % IV SOLN
1000.0000 mL | INTRAVENOUS | Status: DC
  Administered 2018-04-28: 1000 mL via INTRAVENOUS

## 2018-04-28 MED ORDER — SODIUM CHLORIDE 0.9 % IV SOLN
2.0000 g | Freq: Once | INTRAVENOUS | Status: AC
  Administered 2018-04-28: 2 g via INTRAVENOUS
  Filled 2018-04-28: qty 2

## 2018-04-28 MED ORDER — ROSUVASTATIN CALCIUM 5 MG PO TABS
5.0000 mg | ORAL_TABLET | Freq: Every day | ORAL | Status: DC
  Administered 2018-04-28 – 2018-05-03 (×6): 5 mg via ORAL
  Filled 2018-04-28 (×6): qty 1

## 2018-04-28 MED ORDER — LEVOTHYROXINE SODIUM 75 MCG PO TABS
75.0000 ug | ORAL_TABLET | Freq: Every day | ORAL | Status: DC
  Administered 2018-04-29 – 2018-05-10 (×12): 75 ug via ORAL
  Filled 2018-04-28 (×12): qty 1

## 2018-04-28 MED ORDER — ASPIRIN-DIPYRIDAMOLE ER 25-200 MG PO CP12
1.0000 | ORAL_CAPSULE | Freq: Two times a day (BID) | ORAL | Status: DC
Start: 2018-04-28 — End: 2018-05-10
  Administered 2018-04-28 – 2018-05-10 (×23): 1 via ORAL
  Filled 2018-04-28 (×26): qty 1

## 2018-04-28 MED ORDER — ATENOLOL 25 MG PO TABS
100.0000 mg | ORAL_TABLET | Freq: Every day | ORAL | Status: DC
  Administered 2018-04-28 – 2018-05-09 (×8): 100 mg via ORAL
  Filled 2018-04-28 (×4): qty 2
  Filled 2018-04-28 (×2): qty 4
  Filled 2018-04-28 (×2): qty 2
  Filled 2018-04-28: qty 4
  Filled 2018-04-28 (×2): qty 2

## 2018-04-28 MED ORDER — INSULIN ASPART 100 UNIT/ML ~~LOC~~ SOLN: 3.0000 [IU] | Freq: Three times a day (TID) | SUBCUTANEOUS | Status: DC

## 2018-04-28 MED ORDER — VANCOMYCIN HCL IN DEXTROSE 1-5 GM/200ML-% IV SOLN: 1000.0000 mg | Freq: Once | INTRAVENOUS | Status: DC

## 2018-04-28 MED ORDER — SENNOSIDES-DOCUSATE SODIUM 8.6-50 MG PO TABS
1.0000 | ORAL_TABLET | Freq: Every evening | ORAL | Status: DC | PRN
  Administered 2018-05-05: 1 via ORAL
  Filled 2018-04-28: qty 1

## 2018-04-28 MED ORDER — ACETAMINOPHEN 650 MG RE SUPP: 650.0000 mg | Freq: Four times a day (QID) | RECTAL | Status: DC | PRN

## 2018-04-28 MED ORDER — ONDANSETRON HCL 4 MG/2ML IJ SOLN
4.0000 mg | Freq: Four times a day (QID) | INTRAMUSCULAR | Status: DC | PRN
  Administered 2018-04-28: 4 mg via INTRAVENOUS
  Filled 2018-04-28: qty 2

## 2018-04-28 MED ORDER — ENOXAPARIN SODIUM 30 MG/0.3ML ~~LOC~~ SOLN
30.0000 mg | SUBCUTANEOUS | Status: DC
  Administered 2018-04-28 – 2018-05-01 (×4): 30 mg via SUBCUTANEOUS
  Filled 2018-04-28 (×5): qty 0.3

## 2018-04-28 NOTE — Progress Notes (Signed)
Pharmacy Antibiotic Note  Katie Pace is a 82 y.o. female admitted on 04/28/2018 with cellulitis/possible prosthetic joint infection.  Pharmacy has been consulted for Vancomycin and Ceftriaxone dosing.  Plan: Vancomycin 1500mg  loading dose, then 750mg   IV every 24 hours.  Goal trough 15-20 mcg/mL.  Ceftriaxone 2gm IV q24h F/U cxs and clinical progress Monitor V/S, labs, and levels as indicated  Height: 5\' 4"  (162.6 cm) Weight: 155 lb (70.3 kg) IBW/kg (Calculated) : 54.7  Temp (24hrs), Avg:100.1 F (37.8 C), Min:99.1 F (37.3 C), Max:101 F (38.3 C)  Recent Labs  Lab 04/24/18 0543 04/24/18 1512 04/25/18 0209 04/26/18 0251 04/26/18 0745 04/28/18 0937 04/28/18 0950 04/28/18 1228  WBC 12.4* 16.6* 15.6* 11.7*  --  11.4*  --   --   CREATININE 1.79* 1.97* 1.88*  --  1.70* 1.90*  --   --   LATICACIDVEN  --   --   --   --   --   --  0.83 0.80    Estimated Creatinine Clearance: 22.7 mL/min (A) (by C-G formula based on SCr of 1.9 mg/dL (H)).    Allergies  Allergen Reactions  . Codeine Nausea Only  . Dilaudid [Hydromorphone Hcl] Nausea Only  . Lincomycin Hcl Other (See Comments)    Big bumps bilateral legs  . Macrodantin Hives  . Zolpidem Tartrate Other (See Comments)    Stroke  . Azithromycin Rash  . Penicillins Rash    Has patient had a PCN reaction causing immediate rash, facial/tongue/throat swelling, SOB or lightheadedness with hypotension: Yes Has patient had a PCN reaction causing severe rash involving mucus membranes or skin necrosis: No Has patient had a PCN reaction that required hospitalization: No Has patient had a PCN reaction occurring within the last 10 years: No If all of the above answers are "NO", then may proceed with Cephalosporin use.  Patient has tolerated cefazolin with recent surgery  Antimicrobials this admission: Vancomycin 7/25>>  Ceftriaxone 7/25 >>  Aztreonam 2gm IV and Metronidazole 500mg  IV given in ED 7/25  Dose adjustments this  admission: n/a  Microbiology results: 7/25 BCx: pending 7/20 MRSA PCR: positive  Thank you for allowing pharmacy to be a part of this patient's care.  Isac Sarna, BS [place], [place] Clinical Pharmacist Pager [phone] 04/28/2018 3:49 PM

## 2018-04-28 NOTE — ED Provider Notes (Addendum)
[facility] EMERGENCY DEPARTMENT Provider Note   CSN: [id number] Arrival date & time: 04/28/18  0916   History   Chief Complaint Chief Complaint  Patient presents with  . Altered Mental Status    HPI Katie Pace is a 82 y.o. female.  HPI  The patient is an 82-year-old female, history of dementia diabetes and chronic kidney disease who had a left hip fracture after a fall which occurred on 19 July.  She underwent a left hip hemiarthroplasty by Dr. Xu on the 20th of  July and was discharged to a nursing facility on 23 July.  She presents today on 25 July with altered mental status, severe redness of her left leg, weakness and confusion.  According to the paramedics, the patient's spouse and the report from the nursing facility she has had altered mental status and worsening altered mental status since she was discharged.  The patient is unable to give me any history due to dementia and altered mental status from this acute illness.  Level 5 caveat applies.  Further report from the paramedics is that the redness has been spreading from the site of her surgical site and gradually getting worse.  Of note when the patient was admitted to the hospital she did have a urinary tract infection, was treated with intravenous Rocephin and switched to cephalexin with a stop date of today.  Past Medical History:  Diagnosis Date  . AAA (abdominal aortic aneurysm) (HCC)    S/p aortic repair and endarterectomy 12/2011   . Anemia    hx  . Arthritis   . Blood transfusion    back in 1973  . C. difficile diarrhea   . Carotid stenosis, left    S/p left CEA 09/2008  . Diabetes mellitus    type 11  . Endocrine disturbance    gland has not  worked until 1970....dx in 1977  . GERD (gastroesophageal reflux disease)    on meds to help....meds do well   . Heart murmur   . Hypertension   . Hypothyroidism   . Pneumonia    hx  . PVD (peripheral vascular disease) (HCC)   . Sheehan syndrome (HCC)  1977  . Stroke (HCC)    Ischemic x 2       last one 2007    Patient Active Problem List   Diagnosis Date Noted  . Acute cystitis without hematuria   . CKD (chronic kidney disease) stage 3, GFR 30-59 ml/min (HCC) 04/23/2018  . Hypothyroidism 04/23/2018  . Femur fracture, left (HCC) 04/23/2018  . Hip fracture (HCC) 04/23/2018  . PAD (peripheral artery disease) (HCC) 02/18/2016  . Carotid stenosis, asymptomatic 10/29/2015  . Difficulty in walking(719.7) 06/26/2013  . Bilateral leg weakness 06/26/2013  . Occlusion and stenosis of carotid artery without mention of cerebral infarction 04/04/2013  . Aftercare following surgery of the circulatory system, NEC 04/04/2013  . Anemia 10/19/2012  . Spondylolisthesis of lumbar region L3-4 L4-5 with stenosis 10/18/2012  . CKD (chronic kidney disease), stage II 10/18/2012  . C. difficile diarrhea 04/02/2012  . Acute renal failure (HCC) 04/01/2012  . Encephalopathy 04/01/2012  . Sepsis(995.91) 04/01/2012  . Vomiting 04/01/2012  . Hypokalemia 04/01/2012  . Chest pain 02/08/2012  . Renal insufficiency 02/08/2012  . Sheehan syndrome (HCC)   . Hypertension   . Diabetes mellitus (HCC)   . AAA (abdominal aortic aneurysm) (HCC) 12/28/2011  . Abdominal aneurysm without mention of rupture 11/09/2011    Past Surgical History:  Procedure Laterality   Date  . ABDOMINAL AORTIC ANEURYSM REPAIR  12/09/2011   Procedure: ANEURYSM ABDOMINAL AORTIC REPAIR;  Surgeon: James D Lawson, MD;  Location: [facility] OR;  Service: Vascular;  Laterality: N/A;  resection and grafting abdominal aortic aneurysm; Insertion Bilateral External Iliac Graft; Reexploration Proximal Anastemosis; Aortic Endarterectomy; Reimplantation Inferior Mesenteric Artery; Intraaortic Graft  . ABDOMINAL AORTIC ANEURYSM REPAIR    . ABDOMINAL HYSTERECTOMY  1973   1973  . APPENDECTOMY     1960  . BACK SURGERY     1990's  . CARDIAC CATHETERIZATION     1980  &  2009  . CAROTID ENDARTERECTOMY  Dec. 2009    . CESAREAN SECTION    . EYE SURGERY Right May 2016   Pt. fell  . FRACTURE SURGERY     left ankle   . JOINT REPLACEMENT  May 2009   Right knee  . JOINT REPLACEMENT  April 2008   Right Hip prothesis  . SPINE SURGERY  Jan. 14,2014   Lumbar Diskectomy  . TOTAL HIP ARTHROPLASTY Left 04/23/2018   Procedure: ANTERIOR LEFT HIP HEMIARTHROPALSTY;  Surgeon: Xu, Naiping M, MD;  Location: [facility] OR;  Service: Orthopedics;  Laterality: Left;     OB History   None      Home Medications    Prior to Admission medications   Medication Sig Start Date End Date Taking? Authorizing Provider  acetaminophen (TYLENOL) 500 MG tablet Take 500 mg by mouth daily as needed for headache.   Yes [provider]  amitriptyline (ELAVIL) 25 MG tablet Take 25 mg by mouth at bedtime.   Yes [provider]  atenolol (TENORMIN) 100 MG tablet Take 100 mg by mouth at bedtime.    Yes [provider]  Calcium Carbonate (CALCIUM-CARB 600 PO) Take 1 tablet by mouth daily.   Yes [provider]  cephALEXin (KEFLEX) 500 MG capsule Take 1 capsule (500 mg total) by mouth 3 (three) times daily for 2 days. Total of 2 more days 04/26/18 04/28/18 Yes Singh, Prashant K, MD  diltiazem (CARDIZEM CD) 240 MG 24 hr capsule Take 240 mg by mouth every evening.    Yes [provider]  dipyridamole-aspirin (AGGRENOX) 25-200 MG per 12 hr capsule Take 1 capsule by mouth 2 (two) times daily.   Yes [provider]  enoxaparin (LOVENOX) 30 MG/0.3ML injection Inject 0.3 mLs (30 mg total) into the skin daily for 20 days. 04/27/18 05/17/18 Yes Singh, Prashant K, MD  hydrALAZINE (APRESOLINE) 50 MG tablet Take 1 tablet (50 mg total) by mouth every 8 (eight) hours. 04/26/18  Yes Singh, Prashant K, MD  HYDROcodone-acetaminophen (NORCO) 10-325 MG tablet Take 1 tablet by mouth every 6 (six) hours as needed for up to 5 days (pain). Patient taking differently: Take 1 tablet by mouth every 6 (six) hours as needed  for moderate pain.  04/26/18 05/01/18 Yes Singh, Prashant K, MD  hydrocortisone (CORTEF) 5 MG tablet Take 5-15 mg by mouth at bedtime. Take 3 tablets (15 mg) by mouth every morning and 1 tablet (5 mg) at bedtime   Yes [provider]  levothyroxine (SYNTHROID, LEVOTHROID) 75 MCG tablet Take 75 mcg by mouth daily before breakfast.    Yes [provider]  memantine (NAMENDA) 10 MG tablet Take 10 mg by mouth 2 (two) times daily.   Yes [provider]  Multiple Vitamin (MULTIVITAMIN WITH MINERALS) TABS tablet Take 1 tablet by mouth daily.   Yes [provider]  Omega-3 Fatty Acids (FISH OIL   PO) Take 1 capsule by mouth daily.   Yes [provider]  omeprazole (PRILOSEC) 40 MG capsule Take 40 mg by mouth 2 (two) times daily. 04/13/18  Yes [provider]  potassium citrate (UROCIT-K) 10 MEQ (1080 MG) SR tablet Take 20 mEq by mouth daily.    Yes [provider]  rosuvastatin (CRESTOR) 5 MG tablet Take 5 mg by mouth at bedtime.    Yes [provider]  glucose monitoring kit (FREESTYLE) monitoring kit 1 each by Does not apply route as needed. Reported on 10/29/2015    [provider]    Family History Family History  Problem Relation Age of Onset  . Heart disease Mother   . Peripheral vascular disease Mother   . Hypertension Mother   . Varicose Veins Mother   . Alcohol abuse Father   . Varicose Veins Father   . Heart disease Sister   . Heart disease Brother     Social History Social History   Tobacco Use  . Smoking status: Former Smoker    Packs/day: 0.50    Years: 4.00    Pack years: 2.00    Types: Cigarettes    Last attempt to quit: 10/11/1961    Years since quitting: 56.5  . Smokeless tobacco: Never Used  Substance Use Topics  . Alcohol use: No    Alcohol/week: 0.0 oz  . Drug use: No     Allergies   Codeine; Dilaudid [hydromorphone hcl]; Lincomycin hcl; Macrodantin; Zolpidem tartrate; Azithromycin; and  Penicillins   Review of Systems Review of Systems  Unable to perform ROS: Mental status change     Physical Exam Updated Vital Signs BP (!) 185/57 (BP Location: Left Arm)   Pulse 81   Temp (!) 101 F (38.3 C) (Rectal)   Resp 16   SpO2 97%   Physical Exam  Constitutional:  Ill-appearing, somnolent  HENT:  Mucous membranes dry, some food products located on the face, dried to the skin as well as in the mouth  Eyes:  Conjunctive are clear, pupils are reactive  Neck:  Supple neck, no lymphadenopathy  Cardiovascular:  Dose of 85, palpable at the radial arteries bilaterally  Pulmonary/Chest:  No increased work of breathing, mild tachypnea, normal lung sounds  Abdominal:  Diffuse abdominal tenderness, prior midline abdominal scars well-healed.  Some fungal candidal skin infections present under the left pannus  Genitourinary:  Genitourinary Comments: Normal-appearing genitourinary structures, no significant skin breakdown, sacral area inspected under the wound care bandage, no significant skin breakdown.  Musculoskeletal:  Decreased range of motion of the left hip secondary to pain, diffuse erythema of the left lower extremity from just below the knee to the proximal hip, appears near circumferential of the proximal thigh  Neurological:  Altered, confused, difficult time following commands.  Level of alertness is decreased  Skin:  Significant redness and erythema of the thigh surrounding the surgical site and distal to just below the knee     ED Treatments / Results  Labs (all labs ordered are listed, but only abnormal results are displayed) Labs Reviewed  COMPREHENSIVE METABOLIC PANEL - Abnormal; Notable for the following components:      Result Value   Sodium 134 (*)    BUN 34 (*)    Creatinine, Ser 1.90 (*)    Calcium 7.9 (*)    Total Protein 5.8 (*)    Albumin 2.3 (*)    GFR calc non Af Amer 24 (*)    GFR calc  Af Amer 28 (*)    All other components within normal  limits  CBC WITH DIFFERENTIAL/PLATELET - Abnormal; Notable for the following components:   WBC 11.4 (*)    RBC 3.06 (*)    Hemoglobin 9.4 (*)    HCT 29.9 (*)    Neutro Abs 8.4 (*)    Monocytes Absolute 1.3 (*)    All other components within normal limits  URINALYSIS, ROUTINE W REFLEX MICROSCOPIC - Abnormal; Notable for the following components:   APPearance HAZY (*)    Ketones, ur 5 (*)    Protein, ur 100 (*)    Bacteria, UA RARE (*)    All other components within normal limits  CULTURE, BLOOD (ROUTINE X 2)  CULTURE, BLOOD (ROUTINE X 2)  I-STAT CG4 LACTIC ACID, ED  I-STAT CG4 LACTIC ACID, ED    EKG EKG Interpretation  Date/Time:  Thursday April 28 2018 09:42:27 EDT Ventricular Rate:  80 PR Interval:    QRS Duration: 95 QT Interval:  427 QTC Calculation: 493 R Axis:   8 Text Interpretation:  Sinus rhythm Minimal ST depression, anterolateral leads Borderline prolonged QT interval since last tracing no significant change Confirmed by ,  ([id number]) on 04/28/2018 10:19:31 AM   Radiology Dg Chest Port 1 View  Result Date: 04/28/2018 CLINICAL DATA:  82-year-old female code sepsis. EXAM: PORTABLE CHEST 1 VIEW COMPARISON:  Chest radiographs 04/22/2018 and earlier. FINDINGS: Portable AP semi upright view at 0946 hours. Stable cardiomegaly and tortuosity of the thoracic aorta with calcified atherosclerosis. Stable to mildly lower lung volumes. Allowing for portable technique the lungs are clear. No pneumothorax. Paucity bowel gas in the upper abdomen. IMPRESSION: 1. No acute cardiopulmonary abnormality. 2. Chronic cardiomegaly and Aortic Atherosclerosis (ICD10-I70.0). Electronically Signed   By: H  Hall M.D.   On: 04/28/2018 10:10   Dg Hip Unilat W Or Wo Pelvis 2-3 Views Left  Result Date: 04/28/2018 CLINICAL DATA:  82-year-old female code sepsis. Left hip arthroplasty post op day five. EXAM: DG HIP (WITH OR WITHOUT PELVIS) 2-3V LEFT COMPARISON:  Intraoperative images 72019, left  hip series 04/22/2018. FINDINGS: Portable AP supine view at 0950 hours. Interval bipolar left hip arthroplasty. Chronic right proximal femoral arthroplasty. The visible hardware appears intact. The left hip hardware appears normally aligned. Previous lumbar spine fusion. No newosseous abnormality identified. Femoral artery calcified atherosclerosis. Negative visible bowel gas pattern. IMPRESSION: 1. Left hip arthroplasty with no adverse features. No acute osseous abnormality identified. 2. Visible bowel gas pattern is normal. Electronically Signed   By: H  Hall M.D.   On: 04/28/2018 10:16    Procedures .Critical Care Performed by: , , MD Authorized by: , , MD   Critical care provider statement:    Critical care time (minutes):  35   Critical care time was exclusive of:  Separately billable procedures and treating other patients and teaching time   Critical care was necessary to treat or prevent imminent or life-threatening deterioration of the following conditions:  Sepsis   Critical care was time spent personally by me on the following activities:  Blood draw for specimens, development of treatment plan with patient or surrogate, discussions with consultants, evaluation of patient's response to treatment, examination of patient, obtaining history from patient or surrogate, ordering and performing treatments and interventions, ordering and review of laboratory studies, ordering and review of radiographic studies, pulse oximetry, re-evaluation of patient's condition and review of old charts   (including critical care time)  Medications Ordered in ED Medications    0.9 %  sodium chloride infusion (1,000 mLs Intravenous New Bag/Given 04/28/18 1313)  aztreonam (AZACTAM) 2 g in sodium chloride 0.9 % 100 mL IVPB (0 g Intravenous Stopped 04/28/18 1050)  metroNIDAZOLE (FLAGYL) IVPB 500 mg (0 mg Intravenous Stopped 04/28/18 1122)  sodium chloride 0.9 % bolus 1,000 mL (0 mLs Intravenous  Stopped 04/28/18 1049)    And  sodium chloride 0.9 % bolus 1,000 mL (0 mLs Intravenous Stopped 04/28/18 1003)    And  sodium chloride 0.9 % bolus 250 mL (0 mLs Intravenous Stopped 04/28/18 1111)  vancomycin (VANCOCIN) 1,500 mg in sodium chloride 0.9 % 500 mL IVPB (0 mg Intravenous Stopped 04/28/18 1304)  acetaminophen (TYLENOL) tablet 1,000 mg (1,000 mg Oral Given 04/28/18 1049)     Initial Impression / Assessment and Plan / ED Course  I have reviewed the triage vital signs and the nursing notes.  Pertinent labs & imaging results that were available during my care of the patient were reviewed by me and considered in my medical decision making (see chart for details).  Clinical Course as of Apr 28 1328  Thu Apr 28, 2018  0933 Broad-spectrum antibiotics ordered, IV fluids ordered, lactic acid pending.  Code sepsis activated on arrival, multiple antibiotics and fluid boluses ordered.  Critical care provided   [BM]    Clinical Course User Index [BM] , , MD   Due to the patient's altered mental status, her obvious infection of the lower extremity, her palpable temperature is high and it was measured at 101 a code sepsis was called.  I suspect that the patient has a severe postoperative infection which is at least in the soft tissues if not in the joint.  X-ray of the chest and the left hip will be obtained  Urinalysis without infection, creatinine is at baseline at 1.9 compared to prior labs, normal electrolytes, very low albumin and protein consistent with her malnourished state  D/w Dr. Xu - states pt can stay here for IV abx - no drainage from wound - less likely joint involvement  BUN  Date Value Ref Range Status  04/28/2018 34 (H) 8 - 23 mg/dL Final  04/26/2018 26 (H) 8 - 23 mg/dL Final  04/25/2018 29 (H) 8 - 23 mg/dL Final    Comment:    Please note change in reference range.  04/24/2018 31 (H) 8 - 23 mg/dL Final    Comment:    Please note change in reference range.    Creat  Date Value Ref Range Status  11/11/2011 2.17 (H) 0.50 - 1.10 mg/dL Final   Creatinine, Ser  Date Value Ref Range Status  04/28/2018 1.90 (H) 0.44 - 1.00 mg/dL Final  04/26/2018 1.70 (H) 0.44 - 1.00 mg/dL Final  04/25/2018 1.88 (H) 0.44 - 1.00 mg/dL Final  04/24/2018 1.97 (H) 0.44 - 1.00 mg/dL Final     WBC  Date Value Ref Range Status  04/28/2018 11.4 (H) 4.0 - 10.5 K/uL Final  04/26/2018 11.7 (H) 4.0 - 10.5 K/uL Final  04/25/2018 15.6 (H) 4.0 - 10.5 K/uL Final  04/24/2018 16.6 (H) 4.0 - 10.5 K/uL Final    Comment:    WHITE COUNT CONFIRMED ON SMEAR     Vitals:   04/28/18 1130 04/28/18 1200 04/28/18 1230 04/28/18 1300  BP: (!) 142/54 (!) 138/54 (!) 144/48 (!) 143/55  Pulse: 60 73 73 71  Resp: (!) 30 19 17 16  Temp:      TempSrc:      SpO2: 92% 93%   96% (!) 88%  Weight:      Height:         X-ray negative for acute findings of pneumonia, also negative for findings in the left hip, no signs of subcutaneous emphysema or bony breakdown.  Leukocytosis present at 11,400, consistent with prior leukocytosis during fracture with a normal lactic acid.  We will discuss with the hospitalist for admission. Gust with Dr. Jerilee Hoh who will admit.  Final Clinical Impressions(s) / ED Diagnoses   Final diagnoses:  Sepsis, due to unspecified organism [facility])  Sepsis following procedure, initial encounter ([place])  Cellulitis of right leg      Noemi Chapel, MD 04/28/18 1329    Noemi Chapel, MD 05/09/18 [phone]

## 2018-04-28 NOTE — H&P (Signed)
History and Physical    Katie Pace BBC:[id number] DOB: [birth year] DOA: 04/28/2018  Referring MD/NP/PA: Noemi Chapel, EDP PCP: [name], MD  Patient coming from: [facility] SN[facility]  Chief Complaint: Confusion, fever  HPI: Katie Pace is a 82 y.o. female with past medical history significant for a left hip hemiarthroplasty on 7/20 by Dr. 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  Patient is currently encephalopathic and unable to provide accurate history.  Husband at bedside states that when he went to visit her today at the skilled nursing facility he found her slumped over to the side with drooling around her mouth, was not as responsive as usual and felt warm to the touch.  When evaluated by staff at the SNF she was noted to have a temperature of 101 and was confused and hence was sent to the hospital for evaluation.  In the emergency department she had a confirmed temperature of 101, hemodynamically stable, in fact hypertensive, labs significant for creatinine of 1.9 which is close to her baseline, WBCs at 11.4, normal lactic acid of 0.80, she was  mildly tachypneic and tachycardic.  She was found to have significant edema and erythema surrounding her incision of her left hip and extending downwards to the knee.  EDP consulted with Dr. Erlinda Hong, who performed her recent hip surgery who thought that patient could be treated with IV antibiotics at [facility], however I believe it is more reasonable for her to be transferred to [facility] for IV antibiotic therapy, ID consultation and to be formally assessed by her orthopedic surgeon given her recent surgery.   Past Medical/Surgical History: Past Medical History:  Diagnosis Date  . AAA (abdominal aortic aneurysm) (HCC)    S/p aortic  repair and endarterectomy 12/2011   . Anemia    hx  . Arthritis   . Blood transfusion    back in 1973  . C. difficile diarrhea   . Carotid stenosis, left    S/p left CEA 09/2008  . Diabetes mellitus    type 11  . Endocrine disturbance    gland has not  worked until 1970.Marland Kitchen..dx in 1977  . GERD (gastroesophageal reflux disease)    on meds to help....meds do well   . Heart murmur   . Hypertension   . Hypothyroidism   . Pneumonia    hx  . PVD (peripheral vascular disease) ([place])   . 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 Stroke [facility])    Ischemic x 2       last one 2007    Past Surgical History:  Procedure Laterality Date  . ABDOMINAL AORTIC ANEURYSM REPAIR  12/09/2011   Procedure: ANEURYSM ABDOMINAL AORTIC REPAIR;  Surgeon: Mal Misty, MD;  Location: [place];  Service: Vascular;  Laterality: N/A;  resection and grafting abdominal aortic aneurysm; Insertion Bilateral External Iliac Graft; Reexploration Proximal Anastemosis; Aortic Endarterectomy; Reimplantation Inferior Mesenteric Artery; Intraaortic Graft  . ABDOMINAL AORTIC ANEURYSM REPAIR    . [place]  . APPENDECTOMY     1960  . BACK SURGERY     1990's  . [place]  &  2009  . CAROTID ENDARTERECTOMY  Dec. 2009  . CESAREAN SECTION    . EYE SURGERY Right May 2016  Pt. fell  . FRACTURE SURGERY     left ankle   . JOINT REPLACEMENT  May 2009   Right knee  . JOINT REPLACEMENT  April 2008   Right Hip prothesis  . SPINE SURGERY  Jan. 14,2014   Lumbar Diskectomy  . TOTAL HIP ARTHROPLASTY Left 04/23/2018   Procedure: ANTERIOR LEFT HIP HEMIARTHROPALSTY;  Surgeon: Leandrew Koyanagi, MD;  Location: [place];  Service: Orthopedics;  Laterality: Left;    Social History:  reports that she quit smoking about 56 years ago. Her smoking use included cigarettes. She has a 2.00 pack-year smoking history. She has never used smokeless tobacco. She reports that she does not drink alcohol or use  drugs.  Allergies: Allergies  Allergen Reactions  . Codeine Nausea Only  . Dilaudid [Hydromorphone Hcl] Nausea Only  . Lincomycin Hcl Other (See Comments)    Big bumps bilateral legs  . Macrodantin Hives  . Zolpidem Tartrate Other (See Comments)    Stroke  . Azithromycin Rash  . Penicillins Rash    Has patient had a PCN reaction causing immediate rash, facial/tongue/throat swelling, SOB or lightheadedness with hypotension: Yes Has patient had a PCN reaction causing severe rash involving mucus membranes or skin necrosis: No Has patient had a PCN reaction that required hospitalization: No Has patient had a PCN reaction occurring within the last 10 years: No If all of the above answers are "NO", then may proceed with Cephalosporin use.    Family History:  Family History  Problem Relation Age of Onset  . Heart disease Mother   . Peripheral vascular disease Mother   . Hypertension Mother   . Varicose Veins Mother   . Alcohol abuse Father   . Varicose Veins Father   . Heart disease Sister   . Heart disease Brother     Prior to Admission medications   Medication Sig Start Date End Date Taking? Authorizing Provider  acetaminophen (TYLENOL) 500 MG tablet Take 500 mg by mouth daily as needed for headache.   Yes [provider]  amitriptyline (ELAVIL) 25 MG tablet Take 25 mg by mouth at bedtime.   Yes [provider]  atenolol (TENORMIN) 100 MG tablet Take 100 mg by mouth at bedtime.    Yes [provider]  Calcium Carbonate (CALCIUM-CARB 600 PO) Take 1 tablet by mouth daily.   Yes [provider]  cephALEXin (KEFLEX) 500 MG capsule Take 1 capsule (500 mg total) by mouth 3 (three) times daily for 2 days. Total of 2 more days 04/26/18 04/28/18 Yes Thurnell Lose, MD  diltiazem (CARDIZEM CD) 240 MG 24 hr capsule Take 240 mg by mouth every evening.    Yes [provider]  dipyridamole-aspirin (AGGRENOX) 25-200 MG per 12 hr capsule Take 1  capsule by mouth 2 (two) times daily.   Yes [provider]  enoxaparin (LOVENOX) 30 MG/0.3ML injection Inject 0.3 mLs (30 mg total) into the skin daily for 20 days. 04/27/18 05/17/18 Yes Thurnell Lose, MD  hydrALAZINE (APRESOLINE) 50 MG tablet Take 1 tablet (50 mg total) by mouth every 8 (eight) hours. 04/26/18  Yes Thurnell Lose, MD  HYDROcodone-acetaminophen (NORCO) 10-325 MG tablet Take 1 tablet by mouth every 6 (six) hours as needed for up to 5 days (pain). Patient taking differently: Take 1 tablet by mouth every 6 (six) hours as needed for moderate pain.  04/26/18 05/01/18 Yes Thurnell Lose, MD  hydrocortisone (CORTEF) 5 MG tablet Take 5-15 mg by mouth  at bedtime. Take 3 tablets (15 mg) by mouth every morning and 1 tablet (5 mg) at bedtime   Yes [provider]  levothyroxine (SYNTHROID, LEVOTHROID) 75 MCG tablet Take 75 mcg by mouth daily before breakfast.    Yes [provider]  memantine (NAMENDA) 10 MG tablet Take 10 mg by mouth 2 (two) times daily.   Yes [provider]  Multiple Vitamin (MULTIVITAMIN WITH MINERALS) TABS tablet Take 1 tablet by mouth daily.   Yes [provider]  Omega-3 Fatty Acids (FISH OIL PO) Take 1 capsule by mouth daily.   Yes [provider]  omeprazole (PRILOSEC) 40 MG capsule Take 40 mg by mouth 2 (two) times daily. 04/13/18  Yes [provider]  potassium citrate (UROCIT-K) 10 MEQ (1080 MG) SR tablet Take 20 mEq by mouth daily.    Yes [provider]  rosuvastatin (CRESTOR) 5 MG tablet Take 5 mg by mouth at bedtime.    Yes [provider]  glucose monitoring kit (FREESTYLE) monitoring kit 1 each by Does not apply route as needed. Reported on 10/29/2015    [provider]    Review of Systems:  Unable to obtain given acute encephalopathy.   Physical Exam: Vitals:   04/28/18 1430 04/28/18 1500 04/28/18 1530 04/28/18 1548  BP: (!) 159/46 (!) 146/50 (!) 168/57    Pulse: 71 71 71   Resp: (!) 28 (!) 21 (!) 22   Temp:    99.1 F (37.3 C)  TempSrc:    Rectal  SpO2: 92% (!) 88% 94%   Weight:      Height:         Constitutional: NAD, calm, comfortable, confused Eyes: PERRL, lids and conjunctivae normal ENMT: Mucous membranes are dry. Posterior pharynx clear of any exudate or lesions.Normal dentition.  Neck: normal, supple, no masses, no thyromegaly Respiratory: clear to auscultation bilaterally, no wheezing, no crackles. Normal respiratory effort. No accessory muscle use.  Cardiovascular: Regular rate and rhythm, no murmurs / rubs / gallops. No extremity edema. 2+ pedal pulses. No carotid bruits.  Abdomen: no tenderness, no masses palpated. No hepatosplenomegaly. Bowel sounds positive.  Musculoskeletal: Significant erythema and edema extending from her right hip down to the mid calf on the left, there is no purulence at incision site. Neurologic: CN 2-12 gr unable to fully assess given current mental state Psychiatric: Unable to assess given current mental state   Labs on Admission: I have personally reviewed the following labs and imaging studies  CBC: Recent Labs  Lab 04/22/18 2313 04/24/18 0543 04/24/18 1512 04/25/18 0209 04/26/18 0251 04/28/18 0937  WBC 9.8 12.4* 16.6* 15.6* 11.7* 11.4*  NEUTROABS 7.3  --  13.7*  --   --  8.4*  HGB 12.7 10.0* 9.6* 9.6* 8.5* 9.4*  HCT 40.6 32.1* 31.2* 30.3* 26.9* 29.9*  MCV 98.8 98.5 99.4 97.1 96.8 97.7  PLT 178 136* 131* 131* 130* 779   Basic Metabolic Panel: Recent Labs  Lab 04/24/18 0543 04/24/18 1512 04/25/18 0209 04/26/18 0745 04/28/18 0937  NA 135 132* 133* 138 134*  K 4.0 4.2 4.0 3.8 4.0  CL 102 100 100 107 102  CO2 25 20* 23 24 24   GLUCOSE 103* 136* 126* 111* 83  BUN 29* 31* 29* 26* 34*  CREATININE 1.79* 1.97* 1.88* 1.70* 1.90*  CALCIUM 7.9* 7.5* 7.8* 7.7* 7.9*   GFR: Estimated Creatinine Clearance: 22.7 mL/min (A) (by C-G formula based on SCr of 1.9 mg/dL (H)). Liver  Function Tests: Recent  Labs  Lab 04/22/18 2313 04/24/18 0543 04/24/18 1512 04/25/18 0209 04/28/18 0937  AST 19 23 26 26 24   ALT 13 9 7  <5 5  ALKPHOS 55 42 43 43 56  BILITOT 0.6 0.4 0.5 0.4 0.7  PROT 6.8 5.0* 4.9* 5.1* 5.8*  ALBUMIN 3.6 2.5* 2.4* 2.3* 2.3*   No results for input(s): LIPASE, AMYLASE in the last 168 hours. Recent Labs  Lab 04/24/18 1133  AMMONIA 28   Coagulation Profile: Recent Labs  Lab 04/22/18 2313  INR 0.93   Cardiac Enzymes: No results for input(s): CKTOTAL, CKMB, CKMBINDEX, TROPONINI in the last 168 hours. BNP (last 3 results) No results for input(s): PROBNP in the last 8760 hours. HbA1C: No results for input(s): HGBA1C in the last 72 hours. CBG: Recent Labs  Lab 04/23/18 1128 04/23/18 1457 04/24/18 2131  GLUCAP 119* 126* 153*   Lipid Profile: No results for input(s): CHOL, HDL, LDLCALC, TRIG, CHOLHDL, LDLDIRECT in the last 72 hours. Thyroid Function Tests: No results for input(s): TSH, T4TOTAL, FREET4, T3FREE, THYROIDAB in the last 72 hours. Anemia Panel: No results for input(s): VITAMINB12, FOLATE, FERRITIN, TIBC, IRON, RETICCTPCT in the last 72 hours. Urine analysis:    Component Value Date/Time   COLORURINE YELLOW 04/28/2018 0925   APPEARANCEUR HAZY (A) 04/28/2018 0925   LABSPEC 1.018 04/28/2018 0925   PHURINE 5.0 04/28/2018 0925   GLUCOSEU NEGATIVE 04/28/2018 0925   HGBUR NEGATIVE 04/28/2018 0925   BILIRUBINUR NEGATIVE 04/28/2018 0925   KETONESUR 5 (A) 04/28/2018 0925   PROTEINUR 100 (A) 04/28/2018 0925   UROBILINOGEN 0.2 04/01/2012 0510   NITRITE NEGATIVE 04/28/2018 0925   LEUKOCYTESUR NEGATIVE 04/28/2018 0925   Sepsis Labs: @LABRCNTIP (procalcitonin:4,lacticidven:4) ) Recent Results (from the past 240 hour(s))  Urine culture     Status: Abnormal   Collection Time: 04/23/18 12:36 AM  Result Value Ref Range Status   Specimen Description   Final    URINE, CLEAN CATCH Performed at [facility], [address],  [place], [place] [address]    Special Requests   Final    Normal Performed at [facility], [address], [place], [place] [address]    Culture >=100,000 COLONIES/mL CITROBACTER KOSERI (A)  Final   Report Status 04/25/2018 FINAL  Final   Organism ID, Bacteria CITROBACTER KOSERI (A)  Final      Susceptibility   Citrobacter koseri - MIC*    CEFAZOLIN <=4 SENSITIVE Sensitive     CEFTRIAXONE <=1 SENSITIVE Sensitive     CIPROFLOXACIN <=0.25 SENSITIVE Sensitive     GENTAMICIN <=1 SENSITIVE Sensitive     IMIPENEM <=0.25 SENSITIVE Sensitive     NITROFURANTOIN 32 SENSITIVE Sensitive     TRIMETH/SULFA <=20 SENSITIVE Sensitive     PIP/TAZO <=4 SENSITIVE Sensitive     * >=100,000 COLONIES/mL CITROBACTER KOSERI  Surgical pcr screen     Status: Abnormal   Collection Time: 04/23/18  6:57 AM  Result Value Ref Range Status   MRSA, PCR NEGATIVE NEGATIVE Final   Staphylococcus aureus POSITIVE (A) NEGATIVE Final    Comment: (NOTE) The Xpert SA Assay (FDA approved for NASAL specimens in patients 93 years of age and older), is one component of a comprehensive surveillance program. It is not intended to diagnose infection nor to guide or monitor treatment.   Culture, Urine     Status: None   Collection Time: 04/24/18 11:28 AM  Result Value Ref Range Status   Specimen Description URINE, RANDOM  Final   Special Requests NONE  Final  Culture   Final    NO GROWTH Performed at [facility], [place] [address], [place], [place] [address]    Report Status 04/25/2018 FINAL  Final  Blood Culture (routine x 2)     Status: None (Preliminary result)   Collection Time: 04/28/18  9:37 AM  Result Value Ref Range Status   Specimen Description BLOOD LEFT ARM  Final   Special Requests   Final    BOTTLES DRAWN AEROBIC AND ANAEROBIC Blood Culture adequate volume Performed at [facility], [address], [place], [place] [address]    Culture PENDING  Incomplete   Report Status PENDING  Incomplete  Blood Culture  (routine x 2)     Status: None (Preliminary result)   Collection Time: 04/28/18 10:08 AM  Result Value Ref Range Status   Specimen Description BLOOD LEFT HAND  Final   Special Requests   Final    BOTTLES DRAWN AEROBIC AND ANAEROBIC Blood Culture adequate volume Performed at [facility], [address] [place], [place] [address]    Culture PENDING  Incomplete   Report Status PENDING  Incomplete     Radiological Exams on Admission: Dg Chest Port 1 View  Result Date: 04/28/2018 CLINICAL DATA:  82 year old female code sepsis. EXAM: PORTABLE CHEST 1 VIEW COMPARISON:  Chest radiographs 04/22/2018 and earlier. FINDINGS: Portable AP semi upright view at 0946 hours. Stable cardiomegaly and tortuosity of the thoracic aorta with calcified atherosclerosis. Stable to mildly lower lung volumes. Allowing for portable technique the lungs are clear. No pneumothorax. Paucity bowel gas in the upper abdomen. IMPRESSION: 1. No acute cardiopulmonary abnormality. 2. Chronic cardiomegaly and Aortic Atherosclerosis (ICD10-I70.0). Electronically Signed   By: Genevie Ann M.D.   On: 04/28/2018 10:10   Dg Hip Unilat W Or Wo Pelvis 2-3 Views Left  Result Date: 04/28/2018 CLINICAL DATA:  82 year old female code sepsis. Left hip arthroplasty post op day five. EXAM: DG HIP (WITH OR WITHOUT PELVIS) 2-3V LEFT COMPARISON:  Intraoperative images 72019, left hip series 04/22/2018. FINDINGS: Portable AP supine view at 0950 hours. Interval bipolar left hip arthroplasty. Chronic right proximal femoral arthroplasty. The visible hardware appears intact. The left hip hardware appears normally aligned. Previous lumbar spine fusion. No newosseous abnormality identified. Femoral artery calcified atherosclerosis. Negative visible bowel gas pattern. IMPRESSION: 1. Left hip arthroplasty with no adverse features. No acute osseous abnormality identified. 2. Visible bowel gas pattern is normal. Electronically Signed   By: Genevie Ann M.D.   On: 04/28/2018  10:16    EKG: Independently reviewed.  Normal sinus rhythm at a rate of 80, no acute ischemic changes  Assessment/Plan Principal Problem:   Cellulitis of left lower extremity Active Problems:   Sepsis (HCC)   Diabetes mellitus (HCC)   CKD (chronic kidney disease) stage 4, GFR 15-29 ml/min (HCC)   Panhypopituitarism ([place])    Early sepsis due to cellulitis of left lower extremity -Especially concerning giving her recent left hip arthroplasty 5 days ago. -Believe transfer to [facility] for evaluation by her orthopedic surgeon and infectious diseases is the best course of action at this time. -Blood cultures have been requested. -Patient received in the ED broad-spectrum antibiotic therapy consisting of vancomycin, aztreonam and Levaquin. -I have decided to place her on vancomycin to cover strep/staph and the possibility of MRSA as well as Rocephin to cover gram-negative bacilli as empiric antibiotic treatment for concern for prosthetic joint infections.  I believe empiric therapy is warranted in this patient with early sepsis. -Orthopedics,  Dr. Erlinda Hong, needs to be consulted upon arrival.  Panhypopituitarism -Patient is on chronic levothyroxine and hydrocortisone. -Given sepsis will place on stress dose IV hydrocortisone, continue levothyroxine. -This is secondary to Sheehan syndrome that occurred in the 70s.  Type 2 diabetes -Check hemoglobin A1c, place on moderate sliding scale.  Chronic kidney disease stage IV -Baseline creatinine appears to be between 1.7 and 2.1, currently at baseline at 1.9.   DVT prophylaxis: Lovenox Code Status: Full code Family Communication: Husband, daughter and granddaughter at bedside updated on plan of care and all questions answered Disposition Plan: Transfer to [facility] called: Needs consult with orthopedics Admission status: Admit - It is my clinical opinion that admission to INPATIENT is reasonable and necessary because of the  expectation that this patient will require hospital care that crosses at least 2 midnights to treat this condition based on the medical complexity of the problems presented.  Given the aforementioned information, the predictability of an adverse outcome is felt to be significant.      Time Spent: 90 minutes  Estela Isaac Bliss MD [facility] Pager [phone]  If 7PM-7AM, please contact night-coverage [email or web address] Password [facility]  04/28/2018, 4:01 PM

## 2018-04-28 NOTE — ED Triage Notes (Signed)
PT brought in by [facility] from [facility]) today due to altered mental status and possible fever with redness and swelling to post-operative left hip area.

## 2018-04-29 ENCOUNTER — Inpatient Hospital Stay: Payer: Self-pay

## 2018-04-29 DIAGNOSIS — E230 Hypopituitarism: Secondary | ICD-10-CM

## 2018-04-29 DIAGNOSIS — N184 Chronic kidney disease, stage 4 (severe): Secondary | ICD-10-CM

## 2018-04-29 DIAGNOSIS — E118 Type 2 diabetes mellitus with unspecified complications: Secondary | ICD-10-CM

## 2018-04-29 LAB — BASIC METABOLIC PANEL
Anion gap: 10 (ref 5–15)
BUN: 34 mg/dL — ABNORMAL HIGH (ref 8–23)
CALCIUM: 7.4 mg/dL — AB (ref 8.9–10.3)
CHLORIDE: 107 mmol/L (ref 98–111)
CO2: 19 mmol/L — ABNORMAL LOW (ref 22–32)
CREATININE: 1.84 mg/dL — AB (ref 0.44–1.00)
GFR, EST AFRICAN AMERICAN: 29 mL/min — AB (ref >60)
GFR, EST NON AFRICAN AMERICAN: 25 mL/min — AB (ref >60)
Glucose, Bld: 123 mg/dL — ABNORMAL HIGH (ref 70–99)
Potassium: 4.4 mmol/L (ref 3.5–5.1)
SODIUM: 136 mmol/L (ref 135–145)

## 2018-04-29 LAB — CBC
HCT: 26.7 % — ABNORMAL LOW (ref 36.0–46.0)
Hemoglobin: 8.3 g/dL — ABNORMAL LOW (ref 12.0–15.0)
MCH: 31 pg (ref 26.0–34.0)
MCHC: 31.1 g/dL (ref 30.0–36.0)
MCV: 99.6 fL (ref 78.0–100.0)
PLATELETS: 189 10*3/uL (ref 150–400)
RBC: 2.68 MIL/uL — AB (ref 3.87–5.11)
RDW: 15.1 % (ref 11.5–15.5)
WBC: 12 10*3/uL — AB (ref 4.0–10.5)

## 2018-04-29 LAB — GLUCOSE, CAPILLARY
GLUCOSE-CAPILLARY: 115 mg/dL — AB (ref 70–99)
GLUCOSE-CAPILLARY: 175 mg/dL — AB (ref 70–99)
GLUCOSE-CAPILLARY: 143 mg/dL — AB (ref 70–99)
Glucose-Capillary: 148 mg/dL — ABNORMAL HIGH (ref 70–99)

## 2018-04-29 NOTE — Progress Notes (Signed)
Left upper arm Basilic vein assessed  For PICC occupying 36% of vein. Left brachial and cephalic occupying greater than 50% of vein for PICC placement. Left upper arm basilic attempted PICC placement, unable to access basilic X2 attempts. Right arm edematous and red unable to visualize upper arm vessels for PICC placement. Left antecubital peripheral IV established.  RN made aware.

## 2018-04-29 NOTE — Evaluation (Signed)
Occupational Therapy Evaluation Patient Details Name: Katie Pace MRN: [id number] DOB: Dec 18, 1935 Today's Date: 04/29/2018    History of Present Illness 82 yo female admitted from SNF due to fever and decr arousal. pt recent admission on 04/22/18 s/p fall with  walker became entangled with another piece of furniture) resulting in Lt hip fracture. 7/20 surgery with anterior approach bipolar prosthesis  PMH-dementia, HTN,  DM, CKD III, Rt TKR, Rt THR, AAA repair   Clinical Impression   PT admitted with fever and workup for L LE edema r/o cellulitis. Pt currently with functional limitiations due to the deficits listed below (see OT problem list). Pt requires total (A) for adls and total+2 for transfer. Pt benefits from [place] stedy.  Pt will benefit from skilled OT to increase their independence and safety with adls and balance to allow discharge SNF.     Follow Up Recommendations  SNF    Equipment Recommendations  3 in 1 bedside commode;Wheelchair (measurements OT);Wheelchair cushion (measurements OT);Hospital bed    Recommendations for Other Services       Precautions / Restrictions Precautions Precautions: Fall Restrictions Weight Bearing Restrictions: Yes LLE Weight Bearing: Weight bearing as tolerated      Mobility Bed Mobility               General bed mobility comments: in chair on arrival  Transfers Overall transfer level: Needs assistance Equipment used: 2 person hand held assist Transfers: Sit to/from Stand Sit to Stand: Total assist;+2 physical assistance;+2 safety/equipment         General transfer comment: pt requires (A) to power up and to sustain static standing. pt resisting the first attempt    Balance                                           ADL either performed or assessed with clinical judgement   ADL Overall ADL's : Needs assistance/impaired Eating/Feeding: Maximal assistance   Grooming: Maximal assistance   Upper  Body Bathing: Maximal assistance   Lower Body Bathing: Total assistance   Upper Body Dressing : Maximal assistance   Lower Body Dressing: Total assistance                 General ADL Comments: pt complete sit<s.tand from chair with x2 attempts. pt unable to tolerate stedy positioning in partial standing with flaps for greater than 1 minute. pt reporting extreme pain.      Vision         Perception     Praxis      Pertinent Vitals/Pain Pain Assessment: Faces Faces Pain Scale: Hurts whole lot Pain Location: painful with all movement Pain Descriptors / Indicators: Operative site guarding;Grimacing Pain Intervention(s): Monitored during session;Premedicated before session;Repositioned;Ice applied     Hand Dominance Left   Extremity/Trunk Assessment Upper Extremity Assessment Upper Extremity Assessment: RUE deficits/detail RUE Deficits / Details: edema and weeping drainage. pt with a small puddle on the floor from hand with static standing. pt drainage soaking a bath towel on arrival. RN notified and IV stopped. pt with decr edema noted with digit AROM. encouraged to keep AROM to R hand   Lower Extremity Assessment Lower Extremity Assessment: Defer to PT evaluation   Cervical / Trunk Assessment Cervical / Trunk Assessment: Kyphotic   Communication Communication Communication: No difficulties   Cognition Arousal/Alertness: Awake/alert Behavior During Therapy: [facility] for tasks assessed/performed  Overall Cognitive Status: History of cognitive impairments - at baseline                                     General Comments  ice applied to L LE    Exercises     Shoulder Instructions      Home Living Family/patient expects to be discharged to:: Skilled nursing facility                                        Prior Functioning/Environment Level of Independence: Needs assistance  Gait / Transfers Assistance Needed: (A) total +2 for  basic transfer since fall ADL's / Homemaking Assistance Needed: total (A) for all adls since fall            OT Problem List: Decreased strength;Decreased range of motion;Decreased activity tolerance;Impaired balance (sitting and/or standing);Impaired vision/perception;Decreased coordination;Decreased cognition;Decreased safety awareness;Decreased knowledge of use of DME or AE;Decreased knowledge of precautions;Impaired UE functional use      OT Treatment/Interventions: Self-care/ADL training;Therapeutic exercise;DME and/or AE instruction;Therapeutic activities;Patient/family education;Balance training;Cognitive remediation/compensation;Visual/perceptual remediation/compensation;Neuromuscular education;Energy conservation;Manual therapy;Splinting;Modalities    OT Goals(Current goals can be found in the care plan section) Acute Rehab OT Goals Patient Stated Goal: none stated OT Goal Formulation: Patient unable to participate in goal setting Time For Goal Achievement: 05/13/18 Potential to Achieve Goals: Good  OT Frequency: Min 2X/week   Barriers to D/C:            Co-evaluation              AM-PAC PT "6 Clicks" Daily Activity     Outcome Measure Help from another person eating meals?: Total Help from another person taking care of personal grooming?: Total Help from another person toileting, which includes using toliet, bedpan, or urinal?: Total Help from another person bathing (including washing, rinsing, drying)?: Total Help from another person to put on and taking off regular upper body clothing?: Total Help from another person to put on and taking off regular lower body clothing?: Total 6 Click Score: 6   End of Session Equipment Utilized During Treatment: Gait belt;Oxygen Nurse Communication: Mobility status;Precautions  Activity Tolerance: Patient tolerated treatment well Patient left: in chair;with call bell/phone within reach;with family/visitor present  OT Visit  Diagnosis: Unsteadiness on feet (R26.81) Pain - Right/Left: Left Pain - part of body: Hip                Time: 3094-0768 OT Time Calculation (min): 22 min Charges:  OT General Charges $OT Visit: 1 Visit OT Evaluation $OT Eval Moderate Complexity: 1 Mod   Jeri Modena   OTR/L Pager: [phone] Office: [phone] .   Parke Poisson B 04/29/2018, 4:06 PM

## 2018-04-29 NOTE — NC FL2 (Signed)
[place] MEDICAID FL2 LEVEL OF CARE SCREENING TOOL     IDENTIFICATION  Patient Name: Katie Pace Birthdate: 19-Jan-1936 Sex: female Admission Date (Current Location): 04/28/2018  [facility] and [place] Number:  [employer] and Address:  The . [facility], [place] [address], [place], [place] [address]      Provider Number: [id number]  Attending Physician Name and Address:  Cristal Ford, DO  Relative Name and Phone Number:  Haidynn Almendarez, husband, [phone]    Current Level of Care: Hospital Recommended Level of Care: [place] Prior Approval Number:    Date Approved/Denied:   PASRR Number:   [id number]  Discharge Plan: SNF    Current Diagnoses: Patient Active Problem List   Diagnosis Date Noted  . Cellulitis of left lower extremity 04/28/2018  . Sepsis ([place]) 04/28/2018  . CKD (chronic kidney disease) stage 4, GFR 15-29 ml/min (HCC) 04/28/2018  . Panhypopituitarism ([place]) 04/28/2018  . Acute cystitis without hematuria   . CKD (chronic kidney disease) stage 3, GFR 30-59 ml/min (HCC) 04/23/2018  . Hypothyroidism 04/23/2018  . Femur fracture, left ([place]) 04/23/2018  . Hip fracture ([place]) 04/23/2018  . PAD (peripheral artery disease) ([place]) 02/18/2016  . Carotid stenosis, asymptomatic 10/29/2015  . Difficulty in walking(719.7) 06/26/2013  . Bilateral leg weakness 06/26/2013  . Occlusion and stenosis of carotid artery without mention of cerebral infarction 04/04/2013  . Aftercare following surgery of the circulatory system, [place] 04/04/2013  . Anemia 10/19/2012  . Spondylolisthesis of lumbar region L3-4 L4-5 with stenosis 10/18/2012  . CKD (chronic kidney disease), stage II 10/18/2012  . C. difficile diarrhea 04/02/2012  . Acute renal failure ([place]) 04/01/2012  . Encephalopathy 04/01/2012  . Sepsis(995.91) 04/01/2012  . Vomiting 04/01/2012  . Hypokalemia 04/01/2012  . Chest pain 02/08/2012  . Renal insufficiency  02/08/2012  . 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 Hypertension   . Diabetes mellitus ([place])   . AAA (abdominal aortic aneurysm) ([place]) 12/28/2011  . Abdominal aneurysm without mention of rupture 11/09/2011    Orientation RESPIRATION BLADDER Height & Weight      Oriented to Person  O2(nasal canula 1L) Continent Weight: 155 lb (70.3 kg) Height:  5\' 4"  (162.6 cm)  BEHAVIORAL SYMPTOMS/MOOD NEUROLOGICAL BOWEL NUTRITION STATUS     hx of dementia Continent Diet(see discharge summary)  AMBULATORY STATUS COMMUNICATION OF NEEDS Skin   Extensive Assist Verbally Surgical wounds(incision on left hip)                       Personal Care Assistance Level of Assistance  Bathing, Feeding, Dressing Bathing Assistance: Maximum assistance Feeding assistance: Independent Dressing Assistance: Maximum assistance     Functional Limitations Info  Sight, Hearing, Speech Sight Info: Adequate(wears glasses) Hearing Info: Adequate Speech Info: Adequate    SPECIAL CARE FACTORS FREQUENCY  PT (By licensed PT), OT (By licensed OT)     PT Frequency: 5x week OT Frequency: 5x week            Contractures Contractures Info: Not present    Additional Factors Info  Code Status, Allergies, Psychotropic, Insulin Sliding Scale Code Status Info: Full Code Allergies Info: CODEINE, DILAUDID HYDROMORPHONE HCL, LINCOMYCIN HCL, MACRODANTIN, ZOLPIDEM TARTRATE, AZITHROMYCIN, PENICILLINS    Insulin Sliding Scale Info: insulin aspart (novoLOG) injection 0-15 Units 3x daily with meals; insulin aspart (novoLOG) injection 0-5 Units daily at bedtime; insulin aspart (novoLOG) injection 3 Units 3x daily with meals       Current Medications (  04/29/2018):  This is the current hospital active medication list Current Facility-Administered Medications  Medication Dose Route Frequency Provider Last Rate Last Dose  . acetaminophen (TYLENOL) tablet 650 mg  650 mg Oral Q6H PRN Isaac Bliss, Rayford Halsted, MD       Or  .  acetaminophen (TYLENOL) suppository 650 mg  650 mg Rectal Q6H PRN Isaac Bliss, Rayford Halsted, MD      . atenolol (TENORMIN) tablet 100 mg  100 mg Oral QHS Isaac Bliss, Rayford Halsted, MD   100 mg at 04/28/18 2136  . cefTRIAXone (ROCEPHIN) 1 g in sodium chloride 0.9 % 100 mL IVPB  1 g Intravenous Q24H Isaac Bliss, Rayford Halsted, MD   Stopped at 04/28/18 2204  . diltiazem (CARDIZEM CD) 24 hr capsule 240 mg  240 mg Oral QPM Isaac Bliss, Rayford Halsted, MD   240 mg at 04/28/18 2136  . dipyridamole-aspirin (AGGRENOX) 200-25 MG per 12 hr capsule 1 capsule  1 capsule Oral BID Isaac Bliss, Rayford Halsted, MD   1 capsule at 04/28/18 2135  . enoxaparin (LOVENOX) injection 30 mg  30 mg Subcutaneous Q24H Isaac Bliss, Rayford Halsted, MD   30 mg at 04/28/18 2135  . hydrALAZINE (APRESOLINE) tablet 50 mg  50 mg Oral Q8H Isaac Bliss, Rayford Halsted, MD   50 mg at 04/29/18 0506  . hydrocortisone sodium succinate (SOLU-CORTEF) 100 MG injection 100 mg  100 mg Intravenous Q8H Isaac Bliss, Rayford Halsted, MD   100 mg at 04/29/18 0809  . insulin aspart (novoLOG) injection 0-15 Units  0-15 Units Subcutaneous TID WC Isaac Bliss, Rayford Halsted, MD      . insulin aspart (novoLOG) injection 0-5 Units  0-5 Units Subcutaneous QHS Isaac Bliss, Rayford Halsted, MD      . insulin aspart (novoLOG) injection 3 Units  3 Units Subcutaneous TID WC Isaac Bliss, Rayford Halsted, MD      . levothyroxine (SYNTHROID, LEVOTHROID) tablet 75 mcg  75 mcg Oral QAC breakfast Isaac Bliss, Rayford Halsted, MD   75 mcg at 04/29/18 0809  . morphine 2 MG/ML injection 0.5-1 mg  0.5-1 mg Intravenous Q4H PRN Isaac Bliss, Rayford Halsted, MD      . ondansetron [facility]) tablet 4 mg  4 mg Oral Q6H PRN Isaac Bliss, Rayford Halsted, MD       Or  . ondansetron [facility]) injection 4 mg  4 mg Intravenous Q6H PRN Isaac Bliss, Rayford Halsted, MD   4 mg at 04/28/18 2231  . pantoprazole (PROTONIX) EC tablet 40 mg  40 mg Oral Daily Isaac Bliss, Rayford Halsted, MD      . rosuvastatin  (CRESTOR) tablet 5 mg  5 mg Oral QHS Isaac Bliss, Rayford Halsted, MD   5 mg at 04/28/18 2136  . senna-docusate (Senokot-S) tablet 1 tablet  1 tablet Oral QHS PRN Isaac Bliss, Rayford Halsted, MD      . vancomycin (VANCOCIN) IVPB 750 mg/150 ml premix  750 mg Intravenous Q24H Isaac Bliss, Rayford Halsted, MD         Discharge Medications: Please see discharge summary for a list of discharge medications.  Relevant Imaging Results:  Relevant Lab Results:   Additional Information SS# [place], [place]

## 2018-04-29 NOTE — Consult Note (Signed)
   [facility] CM Inpatient Consult   04/29/2018  Katie Pace 1936-03-05 [id number]    Patient screened for potential [facility] Care Management services due to unplanned readmission risk score of 27% (high).  Chart reviewed. It appears patient was admitted from SNF. Will continue to follow for disposition plans and for potential [facility] Care Management needs.   Marthenia Rolling, MSN-Ed, RN,BSN [facility] Liaison [phone]

## 2018-04-29 NOTE — Progress Notes (Signed)
PROGRESS NOTE    Katie Pace  ATF:[id number] DOB: 1935/11/23 DOA: 04/28/2018 PCP: Benita Stabile, MD   Brief Narrative:  HPI On 04/28/2018 by Dr. Ted Mcalpine Katie Pace is a 82 y.o. female with past medical history significant for a left hip hemiarthroplasty on 7/20 by Dr. 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  Patient is currently encephalopathic and unable to provide accurate history.  Husband at bedside states that when he went to visit her today at the skilled nursing facility he found her slumped over to the side with drooling around her mouth, was not as responsive as usual and felt warm to the touch.  When evaluated by staff at the SNF she was noted to have a temperature of 101 and was confused and hence was sent to the hospital for evaluation.  In the emergency department she had a confirmed temperature of 101, hemodynamically stable, in fact hypertensive, labs significant for creatinine of 1.9 which is close to her baseline, WBCs at 11.4, normal lactic acid of 0.80, she was  mildly tachypneic and tachycardic.  She was found to have significant edema and erythema surrounding her incision of her left hip and extending downwards to the knee.  EDP consulted with Dr. Roda Shutters, who performed her recent hip surgery who thought that patient could be treated with IV antibiotics at [facility], however I believe it is more reasonable for her to be transferred to [facility] for IV antibiotic therapy, ID consultation and to be formally assessed by her orthopedic surgeon given her recent surgery.  Assessment & Plan   Left lower extremity erythema and edema, concern for cellulitis -Patient did not meet sepsis criteria on admission.  She did have fever however with no other  significant vital sign changes and no leukocytosis. -Patient does have significant edema and erythema to the left thigh -Status post left hip arthroplasty on 04/23/2018 by Dr. Roda Shutters -Blood cultures -Was given vancomycin, aztreonam and Levaquin given penicillin allergy the emergency room -Currently on vancomycin and ceftriaxone -Orthopedics, Dr.Xu, consulted and appreciated.  Feels that this is not cellulitis and erythema is due to dependent edema as patient has been fairly sedentary for the past week -Continue pain control  -PT and OT consulted  Diabetes mellitus, type II -Hemoglobin A1c 6 -Continue insulin sliding scale with CBG monitoring  Panhypopituitarism -Continue Synthroid and hydrocortisone -Patient had Sheehan syndrome back in the 1970s  Chronic kidney disease, stage IV -Baseline creatinine approximately 1.7-2.1, currently 1.84 -Continue to monitor BMP  Chronic normocytic anemia -Hemoglobin appears to be stable and at baseline -Continue to monitor CBC  DVT Prophylaxis  lovenox  Code Status: Full  Family Communication: Family at bedside  Disposition Plan: Admitted. Pending PT/OT and improvement.  Consultants Orthopedic surgery  Procedures  None  Antibiotics   Anti-infectives (From admission, onward)   Start     Dose/Rate Route Frequency Ordered Stop   04/29/18 1100  vancomycin (VANCOCIN) IVPB 750 mg/150 ml premix     750 mg 150 mL/hr over 60 Minutes Intravenous Every 24 hours 04/28/18 1600     04/28/18 1830  cefTRIAXone (ROCEPHIN) 1 g in sodium chloride 0.9 % 100 mL IVPB     1 g 200 mL/hr over 30 Minutes Intravenous Every 24 hours 04/28/18 1818     04/28/18 1800  cefTRIAXone (ROCEPHIN) 2 g in sodium chloride 0.9 % 100 mL IVPB  Status:  Discontinued     2 g 200 mL/hr over 30 Minutes Intravenous Every 24 hours 04/28/18 1600 04/28/18 1841   04/28/18 1000  vancomycin (VANCOCIN) 1,500 mg in sodium chloride 0.9 % 500 mL IVPB     1,500 mg 250 mL/hr over 120 Minutes  Intravenous  Once 04/28/18 0943 04/28/18 1304   04/28/18 0930  aztreonam (AZACTAM) 2 g in sodium chloride 0.9 % 100 mL IVPB     2 g 200 mL/hr over 30 Minutes Intravenous  Once 04/28/18 0927 04/28/18 1050   04/28/18 0930  metroNIDAZOLE (FLAGYL) IVPB 500 mg     500 mg 100 mL/hr over 60 Minutes Intravenous  Once 04/28/18 0927 04/28/18 1122   04/28/18 0930  vancomycin (VANCOCIN) IVPB 1000 mg/200 mL premix  Status:  Discontinued     1,000 mg 200 mL/hr over 60 Minutes Intravenous  Once 04/28/18 9629 04/28/18 0943      Subjective:   Katie Pace seen and examined today.  Patient has dementia. Patient does not remember having hip surgery last week. She denies pain at this time.   Objective:   Vitals:   04/28/18 1615 04/28/18 1633 04/28/18 1823 04/29/18 0538  BP:  130/76 (!) 180/61 (!) 139/50  Pulse: 78 73 71 (!) 59  Resp: 20 (!) 23 20 18   Temp:   98.6 F (37 C) 97.6 F (36.4 C)  TempSrc:   Oral Oral  SpO2: 100% 99% 98% 98%  Weight:      Height:        Intake/Output Summary (Last 24 hours) at 04/29/2018 1136 Last data filed at 04/29/2018 0505 Gross per 24 hour  Intake 1791.45 ml  Output -  Net 1791.45 ml   Filed Weights   04/28/18 0927  Weight: 70.3 kg (155 lb)   Exam  General: Well developed, well nourished, NAD, appears stated age  HEENT: NCAT, mucous membranes moist.   Neck: Supple  Cardiovascular: S1 S2 auscultated, no rubs, murmurs or gallops. Regular rate and rhythm.  Respiratory: Clear to auscultation bilaterally with equal chest rise  Abdomen: Soft, nontender, nondistended, + bowel sounds  Extremities: warm dry without cyanosis clubbing or edema of RLE. Left calf to thigh erythema and edema  Neuro: AAOx1 (self), nonfocal.   Psych: pleasantly confused   Data Reviewed: I have personally reviewed following labs and imaging studies  CBC: Recent Labs  Lab 04/22/18 2313  04/24/18 1512 04/25/18 0209 04/26/18 0251 04/28/18 0937 04/28/18 1850  04/29/18 0510  WBC 9.8   < > 16.6* 15.6* 11.7* 11.4* 10.4 12.0*  NEUTROABS 7.3  --  13.7*  --   --  8.4*  --   --   HGB 12.7   < > 9.6* 9.6* 8.5* 9.4* 8.7* 8.3*  HCT 40.6   < > 31.2* 30.3* 26.9* 29.9* 29.0* 26.7*  MCV 98.8   < > 99.4 97.1 96.8 97.7 99.3 99.6  PLT 178   < > 131* 131* 130* 221 173 189   < > = values in this interval not displayed.   Basic Metabolic Panel: Recent Labs  Lab 04/24/18 1512 04/25/18 0209 04/26/18 0745 04/28/18 0937 04/28/18 1850 04/29/18 0510  NA 132* 133* 138 134*  --  136  K 4.2 4.0 3.8 4.0  --  4.4  CL 100 100 107 102  --  107  CO2 20* 23 24 24   --  19*  GLUCOSE 136* 126*  111* 83  --  123*  BUN 31* 29* 26* 34*  --  34*  CREATININE 1.97* 1.88* 1.70* 1.90* 1.83* 1.84*  CALCIUM 7.5* 7.8* 7.7* 7.9*  --  7.4*   GFR: Estimated Creatinine Clearance: 23.4 mL/min (A) (by C-G formula based on SCr of 1.84 mg/dL (H)). Liver Function Tests: Recent Labs  Lab 04/22/18 2313 04/24/18 0543 04/24/18 1512 04/25/18 0209 04/28/18 0937  AST 19 23 26 26 24   ALT 13 9 7  <5 5  ALKPHOS 55 42 43 43 56  BILITOT 0.6 0.4 0.5 0.4 0.7  PROT 6.8 5.0* 4.9* 5.1* 5.8*  ALBUMIN 3.6 2.5* 2.4* 2.3* 2.3*   No results for input(s): LIPASE, AMYLASE in the last 168 hours. Recent Labs  Lab 04/24/18 1133  AMMONIA 28   Coagulation Profile: Recent Labs  Lab 04/22/18 2313  INR 0.93   Cardiac Enzymes: No results for input(s): CKTOTAL, CKMB, CKMBINDEX, TROPONINI in the last 168 hours. BNP (last 3 results) No results for input(s): PROBNP in the last 8760 hours. HbA1C: Recent Labs    04/28/18 1850  HGBA1C 6.0*   CBG: Recent Labs  Lab 04/23/18 1128 04/23/18 1457 04/24/18 2131 04/28/18 2058 04/29/18 0739  GLUCAP 119* 126* 153* 103* 115*   Lipid Profile: No results for input(s): CHOL, HDL, LDLCALC, TRIG, CHOLHDL, LDLDIRECT in the last 72 hours. Thyroid Function Tests: No results for input(s): TSH, T4TOTAL, FREET4, T3FREE, THYROIDAB in the last 72 hours. Anemia  Panel: No results for input(s): VITAMINB12, FOLATE, FERRITIN, TIBC, IRON, RETICCTPCT in the last 72 hours. Urine analysis:    Component Value Date/Time   COLORURINE YELLOW 04/28/2018 0925   APPEARANCEUR HAZY (A) 04/28/2018 0925   LABSPEC 1.018 04/28/2018 0925   PHURINE 5.0 04/28/2018 0925   GLUCOSEU NEGATIVE 04/28/2018 0925   HGBUR NEGATIVE 04/28/2018 0925   BILIRUBINUR NEGATIVE 04/28/2018 0925   KETONESUR 5 (A) 04/28/2018 0925   PROTEINUR 100 (A) 04/28/2018 0925   UROBILINOGEN 0.2 04/01/2012 0510   NITRITE NEGATIVE 04/28/2018 0925   LEUKOCYTESUR NEGATIVE 04/28/2018 0925   Sepsis Labs: @LABRCNTIP (procalcitonin:4,lacticidven:4)  ) Recent Results (from the past 240 hour(s))  Urine culture     Status: Abnormal   Collection Time: 04/23/18 12:36 AM  Result Value Ref Range Status   Specimen Description   Final    URINE, CLEAN CATCH Performed at [facility], [address], [place], [place] [address]    Special Requests   Final    Normal Performed at [facility], [address], [place], [place] [address]    Culture >=100,000 COLONIES/mL CITROBACTER KOSERI (A)  Final   Report Status 04/25/2018 FINAL  Final   Organism ID, Bacteria CITROBACTER KOSERI (A)  Final      Susceptibility   Citrobacter koseri - MIC*    CEFAZOLIN <=4 SENSITIVE Sensitive     CEFTRIAXONE <=1 SENSITIVE Sensitive     CIPROFLOXACIN <=0.25 SENSITIVE Sensitive     GENTAMICIN <=1 SENSITIVE Sensitive     IMIPENEM <=0.25 SENSITIVE Sensitive     NITROFURANTOIN 32 SENSITIVE Sensitive     TRIMETH/SULFA <=20 SENSITIVE Sensitive     PIP/TAZO <=4 SENSITIVE Sensitive     * >=100,000 COLONIES/mL CITROBACTER KOSERI  Surgical pcr screen     Status: Abnormal   Collection Time: 04/23/18  6:57 AM  Result Value Ref Range Status   MRSA, PCR NEGATIVE NEGATIVE Final   Staphylococcus aureus POSITIVE (A) NEGATIVE Final    Comment: (NOTE) The Xpert SA Assay (FDA approved for NASAL specimens in  patients 58 years of age and  older), is one component of a comprehensive surveillance program. It is not intended to diagnose infection nor to guide or monitor treatment.   Culture, Urine     Status: None   Collection Time: 04/24/18 11:28 AM  Result Value Ref Range Status   Specimen Description URINE, RANDOM  Final   Special Requests NONE  Final   Culture   Final    NO GROWTH Performed at [facility], [address] [place], [place] [address]    Report Status 04/25/2018 FINAL  Final  Blood Culture (routine x 2)     Status: None (Preliminary result)   Collection Time: 04/28/18  9:37 AM  Result Value Ref Range Status   Specimen Description BLOOD LEFT ARM  Final   Special Requests   Final    BOTTLES DRAWN AEROBIC AND ANAEROBIC Blood Culture adequate volume   Culture   Final    NO GROWTH < 24 HOURS Performed at [facility], [address] [place], [place] [address]    Report Status PENDING  Incomplete  Blood Culture (routine x 2)     Status: None (Preliminary result)   Collection Time: 04/28/18 10:08 AM  Result Value Ref Range Status   Specimen Description BLOOD LEFT HAND  Final   Special Requests   Final    BOTTLES DRAWN AEROBIC AND ANAEROBIC Blood Culture adequate volume   Culture   Final    NO GROWTH < 24 HOURS Performed at [facility], [address] [place], [place] [address]    Report Status PENDING  Incomplete      Radiology Studies: Dg Chest Port 1 View  Result Date: 04/28/2018 CLINICAL DATA:  82 year old female code sepsis. EXAM: PORTABLE CHEST 1 VIEW COMPARISON:  Chest radiographs 04/22/2018 and earlier. FINDINGS: Portable AP semi upright view at 0946 hours. Stable cardiomegaly and tortuosity of the thoracic aorta with calcified atherosclerosis. Stable to mildly lower lung volumes. Allowing for portable technique the lungs are clear. No pneumothorax. Paucity bowel gas in the upper abdomen. IMPRESSION: 1. No acute cardiopulmonary abnormality. 2. Chronic cardiomegaly and Aortic  Atherosclerosis (ICD10-I70.0). Electronically Signed   By: Odessa Fleming M.D.   On: 04/28/2018 10:10   Dg Hip Unilat W Or Wo Pelvis 2-3 Views Left  Result Date: 04/28/2018 CLINICAL DATA:  82 year old female code sepsis. Left hip arthroplasty post op day five. EXAM: DG HIP (WITH OR WITHOUT PELVIS) 2-3V LEFT COMPARISON:  Intraoperative images 72019, left hip series 04/22/2018. FINDINGS: Portable AP supine view at 0950 hours. Interval bipolar left hip arthroplasty. Chronic right proximal femoral arthroplasty. The visible hardware appears intact. The left hip hardware appears normally aligned. Previous lumbar spine fusion. No newosseous abnormality identified. Femoral artery calcified atherosclerosis. Negative visible bowel gas pattern. IMPRESSION: 1. Left hip arthroplasty with no adverse features. No acute osseous abnormality identified. 2. Visible bowel gas pattern is normal. Electronically Signed   By: Odessa Fleming M.D.   On: 04/28/2018 10:16     Scheduled Meds: . atenolol  100 mg Oral QHS  . diltiazem  240 mg Oral QPM  . dipyridamole-aspirin  1 capsule Oral BID  . enoxaparin (LOVENOX) injection  30 mg Subcutaneous Q24H  . hydrALAZINE  50 mg Oral Q8H  . hydrocortisone sod succinate (SOLU-CORTEF) inj  100 mg Intravenous Q8H  . insulin aspart  0-15 Units Subcutaneous TID WC  . insulin aspart  0-5 Units Subcutaneous QHS  . insulin aspart  3 Units Subcutaneous TID  WC  . levothyroxine  75 mcg Oral QAC breakfast  . pantoprazole  40 mg Oral Daily  . rosuvastatin  5 mg Oral QHS   Continuous Infusions: . cefTRIAXone (ROCEPHIN)  IV Stopped (04/28/18 2204)  . vancomycin 750 mg (04/29/18 1126)     LOS: 1 day   Time Spent in minutes   30 minutes  Makinzy Cleere D.O. on 04/29/2018 at 11:36 AM  Between 7am to 7pm - Pager - [phone]  After 7pm go to [email or web address] - password [email or web address]  And look for the night coverage person covering for me after hours  [facility] Office  [phone]

## 2018-04-29 NOTE — Progress Notes (Signed)
I examined the patient today.  My impression of the redness is that it comes from dependent edema.  The patient has essentially not gotten up since surgery and has been in recumbent position for the last week.  She denies any pain in her hip with or without movement.  Her incision is clean and dry.  I have a low suspicion for cellulitis or deep infection at this time.  She needs to mobilize with PT asap.  Ice to left thigh around the clock.

## 2018-04-29 NOTE — Progress Notes (Signed)
Pt has  History of CKD.  Per Dr Ree Kida, pt is ok to get a PICC line with a creatine clearance <30.

## 2018-04-29 NOTE — Social Work (Signed)
Pt was recent discharge to [facility] for SNF. Following for disposition.   [place], [place] Work [phone]

## 2018-04-30 ENCOUNTER — Inpatient Hospital Stay (HOSPITAL_COMMUNITY): Payer: Medicare Other

## 2018-04-30 LAB — BASIC METABOLIC PANEL
Anion gap: 13 (ref 5–15)
BUN: 42 mg/dL — ABNORMAL HIGH (ref 8–23)
CALCIUM: 7.8 mg/dL — AB (ref 8.9–10.3)
CO2: 15 mmol/L — AB (ref 22–32)
CREATININE: 1.94 mg/dL — AB (ref 0.44–1.00)
Chloride: 105 mmol/L (ref 98–111)
GFR calc Af Amer: 27 mL/min — ABNORMAL LOW (ref >60)
GFR calc non Af Amer: 23 mL/min — ABNORMAL LOW (ref >60)
Glucose, Bld: 137 mg/dL — ABNORMAL HIGH (ref 70–99)
Potassium: 4.4 mmol/L (ref 3.5–5.1)
Sodium: 133 mmol/L — ABNORMAL LOW (ref 135–145)

## 2018-04-30 LAB — CBC
HCT: 28.3 % — ABNORMAL LOW (ref 36.0–46.0)
HEMOGLOBIN: 8.6 g/dL — AB (ref 12.0–15.0)
MCH: 30.5 pg (ref 26.0–34.0)
MCHC: 30.4 g/dL (ref 30.0–36.0)
MCV: 100.4 fL — AB (ref 78.0–100.0)
Platelets: 224 10*3/uL (ref 150–400)
RBC: 2.82 MIL/uL — AB (ref 3.87–5.11)
RDW: 15.1 % (ref 11.5–15.5)
WBC: 12.1 10*3/uL — AB (ref 4.0–10.5)

## 2018-04-30 LAB — GLUCOSE, CAPILLARY
GLUCOSE-CAPILLARY: 131 mg/dL — AB (ref 70–99)
GLUCOSE-CAPILLARY: 157 mg/dL — AB (ref 70–99)
Glucose-Capillary: 134 mg/dL — ABNORMAL HIGH (ref 70–99)
Glucose-Capillary: 164 mg/dL — ABNORMAL HIGH (ref 70–99)

## 2018-04-30 MED ORDER — IPRATROPIUM-ALBUTEROL 0.5-2.5 (3) MG/3ML IN SOLN
3.0000 mL | Freq: Four times a day (QID) | RESPIRATORY_TRACT | Status: DC | PRN
  Administered 2018-04-30 – 2018-05-06 (×12): 3 mL via RESPIRATORY_TRACT
  Filled 2018-04-30 (×12): qty 3

## 2018-04-30 MED ORDER — SODIUM CHLORIDE 0.9% FLUSH
10.0000 mL | Freq: Two times a day (BID) | INTRAVENOUS | Status: DC
  Administered 2018-05-01 – 2018-05-07 (×5): 10 mL
  Administered 2018-05-08: 30 mL
  Administered 2018-05-08 – 2018-05-10 (×4): 10 mL

## 2018-04-30 MED ORDER — SODIUM CHLORIDE 0.9% FLUSH
10.0000 mL | INTRAVENOUS | Status: DC | PRN
  Administered 2018-05-03: 10 mL
  Administered 2018-05-05: 20 mL
  Filled 2018-04-30 (×2): qty 40

## 2018-04-30 NOTE — Progress Notes (Signed)
PROGRESS NOTE    Katie Pace  ZOX:[id number] DOB: 10/04/1936 DOA: 04/28/2018 PCP: Benita Stabile, MD   Brief Narrative:  HPI On 04/28/2018 by Dr. Ted Mcalpine Katie Pace is a 82 y.o. female with past medical history significant for a left hip hemiarthroplasty on 7/20 by Dr. 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  Patient is currently encephalopathic and unable to provide accurate history.  Husband at bedside states that when he went to visit her today at the skilled nursing facility he found her slumped over to the side with drooling around her mouth, was not as responsive as usual and felt warm to the touch.  When evaluated by staff at the SNF she was noted to have a temperature of 101 and was confused and hence was sent to the hospital for evaluation.  In the emergency department she had a confirmed temperature of 101, hemodynamically stable, in fact hypertensive, labs significant for creatinine of 1.9 which is close to her baseline, WBCs at 11.4, normal lactic acid of 0.80, she was  mildly tachypneic and tachycardic.  She was found to have significant edema and erythema surrounding her incision of her left hip and extending downwards to the knee.  EDP consulted with Dr. Roda Shutters, who performed her recent hip surgery who thought that patient could be treated with IV antibiotics at [facility], however I believe it is more reasonable for her to be transferred to [facility] for IV antibiotic therapy, ID consultation and to be formally assessed by her orthopedic surgeon given her recent surgery.  Interim history Admitted for possible left lower extremity cellulitis.  Orthopedics consulted and appreciated.  Assessment & Plan   Left lower extremity erythema and edema, concern for  cellulitis -Patient did not meet sepsis criteria on admission.  She did have fever however with no other significant vital sign changes and no leukocytosis. -Patient does have significant edema and erythema to the left thigh -Status post left hip arthroplasty on 04/23/2018 by Dr. Roda Shutters -Blood cultures show no growth to date -Was given vancomycin, aztreonam and Levaquin given penicillin allergy the emergency room -Currently on vancomycin and ceftriaxone -Orthopedics, Dr.Xu, consulted and appreciated.  Feels that this is not cellulitis and erythema is due to dependent edema as patient has been fairly sedentary for the past week -Continue pain control  -OT consulted- recommended SNF -pending PT consult  Diabetes mellitus, type II -Hemoglobin A1c 6 -Continue insulin sliding scale with CBG monitoring -not on any medications at home  Panhypopituitarism -Continue Synthroid and hydrocortisone -Patient had Sheehan syndrome back in the 1970s  Chronic kidney disease, stage IV -Baseline creatinine approximately 1.7-2.1, currently 1.94 -Continue to monitor BMP  Chronic normocytic anemia -Hemoglobin appears to be stable and at baseline, currently 8.6 -Continue to monitor CBC  DVT Prophylaxis  lovenox  Code Status: Full  Family Communication: Family at bedside  Disposition Plan: Admitted. Suspect D/c to SNF in 48hrs (pending improvement of edema and erythema)  Consultants Orthopedic surgery  Procedures  None  Antibiotics   Anti-infectives (From admission, onward)   Start     Dose/Rate Route Frequency Ordered Stop   04/29/18 1100  vancomycin (VANCOCIN) IVPB 750 mg/150 ml premix     750 mg 150 mL/hr over 60 Minutes Intravenous Every 24 hours 04/28/18 1600  04/28/18 1830  cefTRIAXone (ROCEPHIN) 1 g in sodium chloride 0.9 % 100 mL IVPB     1 g 200 mL/hr over 30 Minutes Intravenous Every 24 hours 04/28/18 1818     04/28/18 1800  cefTRIAXone (ROCEPHIN) 2 g in sodium chloride 0.9 % 100  mL IVPB  Status:  Discontinued     2 g 200 mL/hr over 30 Minutes Intravenous Every 24 hours 04/28/18 1600 04/28/18 1841   04/28/18 1000  vancomycin (VANCOCIN) 1,500 mg in sodium chloride 0.9 % 500 mL IVPB     1,500 mg 250 mL/hr over 120 Minutes Intravenous  Once 04/28/18 0943 04/28/18 1304   04/28/18 0930  aztreonam (AZACTAM) 2 g in sodium chloride 0.9 % 100 mL IVPB     2 g 200 mL/hr over 30 Minutes Intravenous  Once 04/28/18 0927 04/28/18 1050   04/28/18 0930  metroNIDAZOLE (FLAGYL) IVPB 500 mg     500 mg 100 mL/hr over 60 Minutes Intravenous  Once 04/28/18 0927 04/28/18 1122   04/28/18 0930  vancomycin (VANCOCIN) IVPB 1000 mg/200 mL premix  Status:  Discontinued     1,000 mg 200 mL/hr over 60 Minutes Intravenous  Once 04/28/18 1610 04/28/18 0943      Subjective:   Katie Pace seen and examined today.  Patient has dementia. Has no complaints today.  Objective:   Vitals:   04/29/18 0538 04/29/18 2143 04/30/18 0404 04/30/18 0543  BP: (!) 139/50 (!) 156/48 92/62 (!) 156/66  Pulse: (!) 59 (!) 59 (!) 57 (!) 56  Resp: 18 18 20    Temp: 97.6 F (36.4 C) 97.7 F (36.5 C)  (!) 97.5 F (36.4 C)  TempSrc: Oral Oral    SpO2: 98% 92% 99%   Weight:      Height:        Intake/Output Summary (Last 24 hours) at 04/30/2018 1136 Last data filed at 04/30/2018 0900 Gross per 24 hour  Intake 447 ml  Output -  Net 447 ml   Filed Weights   04/28/18 0927  Weight: 70.3 kg (155 lb)   Exam  General: Well developed, well nourished, NAD, appears stated age  HEENT: NCAT, mucous membranes moist.   Neck: Supple  Cardiovascular: S1 S2 auscultated, no rubs, murmurs or gallops. Regular rate and rhythm.  Respiratory: Clear to auscultation bilaterally with equal chest rise  Abdomen: Soft, nontender, nondistended, + bowel sounds  Extremities: warm dry without cyanosis clubbing or edema of RLE. LLE edema extending to the thigh, erythema mildly improved  Neuro: AAOx1 (self only),  nonfocal  Psych: Pleasantly confused  Data Reviewed: I have personally reviewed following labs and imaging studies  CBC: Recent Labs  Lab 04/24/18 1512  04/26/18 0251 04/28/18 0937 04/28/18 1850 04/29/18 0510 04/30/18 0531  WBC 16.6*   < > 11.7* 11.4* 10.4 12.0* 12.1*  NEUTROABS 13.7*  --   --  8.4*  --   --   --   HGB 9.6*   < > 8.5* 9.4* 8.7* 8.3* 8.6*  HCT 31.2*   < > 26.9* 29.9* 29.0* 26.7* 28.3*  MCV 99.4   < > 96.8 97.7 99.3 99.6 100.4*  PLT 131*   < > 130* 221 173 189 224   < > = values in this interval not displayed.   Basic Metabolic Panel: Recent Labs  Lab 04/25/18 0209 04/26/18 0745 04/28/18 0937 04/28/18 1850 04/29/18 0510 04/30/18 0531  NA 133* 138 134*  --  136 133*  K 4.0 3.8 4.0  --  4.4 4.4  CL 100 107 102  --  107 105  CO2 23 24 24   --  19* 15*  GLUCOSE 126* 111* 83  --  123* 137*  BUN 29* 26* 34*  --  34* 42*  CREATININE 1.88* 1.70* 1.90* 1.83* 1.84* 1.94*  CALCIUM 7.8* 7.7* 7.9*  --  7.4* 7.8*   GFR: Estimated Creatinine Clearance: 22.2 mL/min (A) (by C-G formula based on SCr of 1.94 mg/dL (H)). Liver Function Tests: Recent Labs  Lab 04/24/18 0543 04/24/18 1512 04/25/18 0209 04/28/18 0937  AST 23 26 26 24   ALT 9 7 <5 5  ALKPHOS 42 43 43 56  BILITOT 0.4 0.5 0.4 0.7  PROT 5.0* 4.9* 5.1* 5.8*  ALBUMIN 2.5* 2.4* 2.3* 2.3*   No results for input(s): LIPASE, AMYLASE in the last 168 hours. Recent Labs  Lab 04/24/18 1133  AMMONIA 28   Coagulation Profile: No results for input(s): INR, PROTIME in the last 168 hours. Cardiac Enzymes: No results for input(s): CKTOTAL, CKMB, CKMBINDEX, TROPONINI in the last 168 hours. BNP (last 3 results) No results for input(s): PROBNP in the last 8760 hours. HbA1C: Recent Labs    04/28/18 1850  HGBA1C 6.0*   CBG: Recent Labs  Lab 04/29/18 0739 04/29/18 1205 04/29/18 1706 04/29/18 2138 04/30/18 0804  GLUCAP 115* 175* 143* 148* 131*   Lipid Profile: No results for input(s): CHOL, HDL,  LDLCALC, TRIG, CHOLHDL, LDLDIRECT in the last 72 hours. Thyroid Function Tests: No results for input(s): TSH, T4TOTAL, FREET4, T3FREE, THYROIDAB in the last 72 hours. Anemia Panel: No results for input(s): VITAMINB12, FOLATE, FERRITIN, TIBC, IRON, RETICCTPCT in the last 72 hours. Urine analysis:    Component Value Date/Time   COLORURINE YELLOW 04/28/2018 0925   APPEARANCEUR HAZY (A) 04/28/2018 0925   LABSPEC 1.018 04/28/2018 0925   PHURINE 5.0 04/28/2018 0925   GLUCOSEU NEGATIVE 04/28/2018 0925   HGBUR NEGATIVE 04/28/2018 0925   BILIRUBINUR NEGATIVE 04/28/2018 0925   KETONESUR 5 (A) 04/28/2018 0925   PROTEINUR 100 (A) 04/28/2018 0925   UROBILINOGEN 0.2 04/01/2012 0510   NITRITE NEGATIVE 04/28/2018 0925   LEUKOCYTESUR NEGATIVE 04/28/2018 0925   Sepsis Labs: @LABRCNTIP (procalcitonin:4,lacticidven:4)  ) Recent Results (from the past 240 hour(s))  Urine culture     Status: Abnormal   Collection Time: 04/23/18 12:36 AM  Result Value Ref Range Status   Specimen Description   Final    URINE, CLEAN CATCH Performed at [facility], [address], [place], [place] [address]    Special Requests   Final    Normal Performed at [facility], [address], [place], [place] [address]    Culture >=100,000 COLONIES/mL CITROBACTER KOSERI (A)  Final   Report Status 04/25/2018 FINAL  Final   Organism ID, Bacteria CITROBACTER KOSERI (A)  Final      Susceptibility   Citrobacter koseri - MIC*    CEFAZOLIN <=4 SENSITIVE Sensitive     CEFTRIAXONE <=1 SENSITIVE Sensitive     CIPROFLOXACIN <=0.25 SENSITIVE Sensitive     GENTAMICIN <=1 SENSITIVE Sensitive     IMIPENEM <=0.25 SENSITIVE Sensitive     NITROFURANTOIN 32 SENSITIVE Sensitive     TRIMETH/SULFA <=20 SENSITIVE Sensitive     PIP/TAZO <=4 SENSITIVE Sensitive     * >=100,000 COLONIES/mL CITROBACTER KOSERI  Surgical pcr screen     Status: Abnormal   Collection Time: 04/23/18  6:57 AM  Result Value Ref Range Status   MRSA, PCR NEGATIVE  NEGATIVE Final   Staphylococcus aureus  POSITIVE (A) NEGATIVE Final    Comment: (NOTE) The Xpert SA Assay (FDA approved for NASAL specimens in patients 75 years of age and older), is one component of a comprehensive surveillance program. It is not intended to diagnose infection nor to guide or monitor treatment.   Culture, Urine     Status: None   Collection Time: 04/24/18 11:28 AM  Result Value Ref Range Status   Specimen Description URINE, RANDOM  Final   Special Requests NONE  Final   Culture   Final    NO GROWTH Performed at [facility], [address], [place], [place] [address]    Report Status 04/25/2018 FINAL  Final  Blood Culture (routine x 2)     Status: None (Preliminary result)   Collection Time: 04/28/18  9:37 AM  Result Value Ref Range Status   Specimen Description BLOOD LEFT ARM  Final   Special Requests   Final    BOTTLES DRAWN AEROBIC AND ANAEROBIC Blood Culture adequate volume   Culture   Final    NO GROWTH 2 DAYS Performed at [facility], [address] [place], [place] [address]    Report Status PENDING  Incomplete  Blood Culture (routine x 2)     Status: None (Preliminary result)   Collection Time: 04/28/18 10:08 AM  Result Value Ref Range Status   Specimen Description BLOOD LEFT HAND  Final   Special Requests   Final    BOTTLES DRAWN AEROBIC AND ANAEROBIC Blood Culture adequate volume   Culture   Final    NO GROWTH 2 DAYS Performed at [facility], [address] [place], [place] [address]    Report Status PENDING  Incomplete      Radiology Studies: [place] Ekg Site Rite  Result Date: 04/29/2018 If Site Rite image not attached, placement could not be confirmed due to current cardiac rhythm.  [place] Ekg Site Rite  Result Date: 04/29/2018 If Site Rite image not attached, placement could not be confirmed due to current cardiac rhythm.    Scheduled Meds: . atenolol  100 mg Oral QHS  . diltiazem  240 mg Oral QPM  . dipyridamole-aspirin  1  capsule Oral BID  . enoxaparin (LOVENOX) injection  30 mg Subcutaneous Q24H  . hydrALAZINE  50 mg Oral Q8H  . hydrocortisone sod succinate (SOLU-CORTEF) inj  100 mg Intravenous Q8H  . insulin aspart  0-15 Units Subcutaneous TID WC  . insulin aspart  0-5 Units Subcutaneous QHS  . levothyroxine  75 mcg Oral QAC breakfast  . pantoprazole  40 mg Oral Daily  . rosuvastatin  5 mg Oral QHS   Continuous Infusions: . cefTRIAXone (ROCEPHIN)  IV 1 g (04/30/18 0038)  . vancomycin 750 mg (04/30/18 1125)     LOS: 2 days   Time Spent in minutes   30 minutes  Ladarian Bonczek D.O. on 04/30/2018 at 11:36 AM  Between 7am to 7pm - Pager - [phone]  After 7pm go to [email or web address] - password [email or web address]  And look for the night coverage person covering for me after hours  [facility] Office  [phone]

## 2018-04-30 NOTE — Progress Notes (Signed)
@   2000 Pt noted with expiratory wheezing on call Katie Newcomer, Np made aware with new orders chest xray and duoneb q 6hr PRN. Pt given duoneb with relief. @2318  chest xray results in and notified Xenia Blount,NP with no new order.

## 2018-04-30 NOTE — Evaluation (Signed)
Physical Therapy Evaluation Patient Details Name: Katie Pace MRN: [id number] DOB: 24-Jun-1936 Today's Date: 04/30/2018   History of Present Illness  Pt is an 82 y/o female admitted from SNF due to fever and decr arousal. pt recent admission on 04/22/18 s/p fall with  walker became entangled with another piece of furniture) resulting in Lt hip fracture. 7/20 surgery with anterior approach bipolar prosthesis  PMH-dementia, HTN,  DM, CKD III, Rt TKR, Rt THR, AAA repair    Clinical Impression  Pt presented sitting OOB in recliner chair, awake and willing to participate in therapy session. Prior to admission, pt was at a SNF where she and her family reported that they have not done anything with her since d/c from the hospital. Pt currently requires max A x2 for transfers with use of STEDY. Pt limited secondary to fatigue and pain. Pt would continue to benefit from skilled physical therapy services at this time while admitted and after d/c to address the below listed limitations in order to improve overall safety and independence with functional mobility.      Follow Up Recommendations SNF;Supervision/Assistance - 24 hour    Equipment Recommendations  None recommended by PT    Recommendations for Other Services       Precautions / Restrictions Precautions Precautions: Fall Restrictions Weight Bearing Restrictions: Yes LLE Weight Bearing: Weight bearing as tolerated      Mobility  Bed Mobility               General bed mobility comments: in chair on arrival  Transfers Overall transfer level: Needs assistance   Transfers: Sit to/from Stand Sit to Stand: Max assist;+2 physical assistance         General transfer comment: max A x2 to power into standing from recliner chair; performed twice  Ambulation/Gait                Stairs            Wheelchair Mobility    Modified Rankin (Stroke Patients Only)       Balance Overall balance assessment: Needs  assistance Sitting-balance support: Feet supported Sitting balance-[name]: Fair     Standing balance support: During functional activity;Bilateral upper extremity supported Standing balance-[name]: Poor                               Pertinent Vitals/Pain Pain Assessment: Faces Faces Pain Scale: Hurts whole lot Pain Location: painful with all movement Pain Descriptors / Indicators: Operative site guarding;Grimacing Pain Intervention(s): Monitored during session;Repositioned    Home Living Family/patient expects to be discharged to:: Skilled nursing facility                      Prior Function Level of Independence: Needs assistance   Gait / Transfers Assistance Needed: (A) total +2 for basic transfer since fall  ADL's / Homemaking Assistance Needed: total (A) for all adls since fall        Hand Dominance   Dominant Hand: Left    Extremity/Trunk Assessment   Upper Extremity Assessment Upper Extremity Assessment: Defer to OT evaluation    Lower Extremity Assessment Lower Extremity Assessment: Generalized weakness    Cervical / Trunk Assessment Cervical / Trunk Assessment: Kyphotic  Communication   Communication: No difficulties  Cognition Arousal/Alertness: Awake/alert Behavior During Therapy: WFL for tasks assessed/performed Overall Cognitive Status: History of cognitive impairments - at baseline  General Comments General comments (skin integrity, edema, etc.): ice applied to L hip; multiple family members present throughout    Exercises Other Exercises Other Exercises: PT reviewed total joint therex with family and pt; encouraged to perform throughout the day   Assessment/Plan    PT Assessment Patient needs continued PT services  PT Problem List Decreased strength;Decreased range of motion;Decreased activity tolerance;Decreased balance;Decreased mobility;Decreased  coordination;Decreased knowledge of use of DME;Decreased safety awareness;Decreased knowledge of precautions;Decreased cognition;Pain       PT Treatment Interventions DME instruction;Stair training;Gait training;Functional mobility training;Therapeutic activities;Therapeutic exercise;Balance training;Neuromuscular re-education;Patient/family education    PT Goals (Current goals can be found in the Care Plan section)  Acute Rehab PT Goals Patient Stated Goal: to get stronger, to sit up in the chair for her meals PT Goal Formulation: With patient/family Time For Goal Achievement: 05/14/18 Potential to Achieve Goals: Fair    Frequency Min 3X/week   Barriers to discharge        Co-evaluation               AM-PAC PT "6 Clicks" Daily Activity  Outcome Measure Difficulty turning over in bed (including adjusting bedclothes, sheets and blankets)?: Unable Difficulty moving from lying on back to sitting on the side of the bed? : Unable Difficulty sitting down on and standing up from a chair with arms (e.g., wheelchair, bedside commode, etc,.)?: Unable Help needed moving to and from a bed to chair (including a wheelchair)?: Total Help needed walking in hospital room?: Total Help needed climbing 3-5 steps with a railing? : Total 6 Click Score: 6    End of Session Equipment Utilized During Treatment: Gait belt Activity Tolerance: Patient tolerated treatment well Patient left: in chair;with call bell/phone within reach;with family/visitor present Nurse Communication: Mobility status;Need for lift equipment PT Visit Diagnosis: Other abnormalities of gait and mobility (R26.89);Pain Pain - Right/Left: Left Pain - part of body: Hip    Time: 4503-8882 PT Time Calculation (min) (ACUTE ONLY): 25 min   Charges:   PT Evaluation $PT Eval Moderate Complexity: 1 Mod PT Treatments $Therapeutic Activity: 8-22 mins        [place], [place], DPT [place] 04/30/2018,  4:10 PM

## 2018-04-30 NOTE — Progress Notes (Signed)
Peripherally Inserted Central Catheter/Midline Placement  The IV Nurse has discussed with the patient and/or persons authorized to consent for the patient, the purpose of this procedure and the potential benefits and risks involved with this procedure.  The benefits include less needle sticks, lab draws from the catheter, and the patient may be discharged home with the catheter. Risks include, but not limited to, infection, bleeding, blood clot (thrombus formation), and puncture of an artery; nerve damage and irregular heartbeat and possibility to perform a PICC exchange if needed/ordered by physician.  Alternatives to this procedure were also discussed.  Bard Power PICC patient education guide, fact sheet on infection prevention and patient information card has been provided to patient /or left at bedside. Husband at bedside, agreeable to PICC placement today.   PICC/Midline Placement Documentation  PICC Single Lumen 04/30/18 PICC Right Brachial 40 cm 0 cm (Active)  Indication for Insertion or Continuance of Line Poor Vasculature-patient has had multiple peripheral attempts or PIVs lasting less than 24 hours 04/30/2018  6:31 PM  Exposed Catheter (cm) 0 cm 04/30/2018  6:31 PM  Site Assessment Dry;Clean;Intact 04/30/2018  6:31 PM  Line Status Flushed;Blood return noted 04/30/2018  6:31 PM  Dressing Type Transparent 04/30/2018  6:31 PM  Dressing Status Clean;Dry;Intact;Antimicrobial disc in place 04/30/2018  6:31 PM  Dressing Intervention New dressing 04/30/2018  6:31 PM  Dressing Change Due 05/07/18 04/30/2018  6:31 PM       Rolena Infante 04/30/2018, 6:56 PM

## 2018-05-01 DIAGNOSIS — D649 Anemia, unspecified: Secondary | ICD-10-CM

## 2018-05-01 DIAGNOSIS — R062 Wheezing: Secondary | ICD-10-CM

## 2018-05-01 LAB — BASIC METABOLIC PANEL
Anion gap: 8 (ref 5–15)
BUN: 45 mg/dL — ABNORMAL HIGH (ref 8–23)
CO2: 19 mmol/L — ABNORMAL LOW (ref 22–32)
Calcium: 7.8 mg/dL — ABNORMAL LOW (ref 8.9–10.3)
Chloride: 104 mmol/L (ref 98–111)
Creatinine, Ser: 2.17 mg/dL — ABNORMAL HIGH (ref 0.44–1.00)
GFR calc non Af Amer: 20 mL/min — ABNORMAL LOW (ref >60)
GFR, EST AFRICAN AMERICAN: 24 mL/min — AB (ref >60)
Glucose, Bld: 163 mg/dL — ABNORMAL HIGH (ref 70–99)
Potassium: 4.1 mmol/L (ref 3.5–5.1)
SODIUM: 131 mmol/L — AB (ref 135–145)

## 2018-05-01 LAB — GLUCOSE, CAPILLARY
GLUCOSE-CAPILLARY: 141 mg/dL — AB (ref 70–99)
GLUCOSE-CAPILLARY: 202 mg/dL — AB (ref 70–99)
GLUCOSE-CAPILLARY: 156 mg/dL — AB (ref 70–99)
Glucose-Capillary: 121 mg/dL — ABNORMAL HIGH (ref 70–99)

## 2018-05-01 LAB — CBC
HCT: 25.9 % — ABNORMAL LOW (ref 36.0–46.0)
Hemoglobin: 7.9 g/dL — ABNORMAL LOW (ref 12.0–15.0)
MCH: 30.6 pg (ref 26.0–34.0)
MCHC: 30.5 g/dL (ref 30.0–36.0)
MCV: 100.4 fL — ABNORMAL HIGH (ref 78.0–100.0)
Platelets: 252 10*3/uL (ref 150–400)
RBC: 2.58 MIL/uL — AB (ref 3.87–5.11)
RDW: 15.3 % (ref 11.5–15.5)
WBC: 11.9 10*3/uL — AB (ref 4.0–10.5)

## 2018-05-01 LAB — HEMOGLOBIN AND HEMATOCRIT, BLOOD
HCT: 26.5 % — ABNORMAL LOW (ref 36.0–46.0)
Hemoglobin: 8 g/dL — ABNORMAL LOW (ref 12.0–15.0)

## 2018-05-01 MED ORDER — VANCOMYCIN HCL 500 MG IV SOLR: 500.0000 mg | INTRAVENOUS | Status: DC

## 2018-05-01 MED ORDER — SALINE SPRAY 0.65 % NA SOLN
1.0000 | NASAL | Status: DC | PRN
  Administered 2018-05-09: 1 via NASAL
  Filled 2018-05-01: qty 44

## 2018-05-01 MED ORDER — HYDROCORTISONE NA SUCCINATE PF 100 MG IJ SOLR
100.0000 mg | Freq: Two times a day (BID) | INTRAMUSCULAR | Status: DC
  Administered 2018-05-01 – 2018-05-04 (×6): 100 mg via INTRAVENOUS
  Filled 2018-05-01 (×6): qty 2

## 2018-05-01 MED ORDER — VANCOMYCIN VARIABLE DOSE PER UNSTABLE RENAL FUNCTION (PHARMACIST DOSING): Status: DC

## 2018-05-01 NOTE — Progress Notes (Signed)
ANTIBIOTIC CONSULT NOTE  Pharmacy Consult for Vancomycin Indication: cellulitis  Allergies  Allergen Reactions  . Codeine Nausea Only  . Dilaudid [Hydromorphone Hcl] Nausea Only  . Lincomycin Hcl Other (See Comments)    Big bumps bilateral legs  . Macrodantin Hives  . Zolpidem Tartrate Other (See Comments)    Stroke  . Azithromycin Rash  . Penicillins Rash    Has patient had a PCN reaction causing immediate rash, facial/tongue/throat swelling, SOB or lightheadedness with hypotension: Yes Has patient had a PCN reaction causing severe rash involving mucus membranes or skin necrosis: No Has patient had a PCN reaction that required hospitalization: No Has patient had a PCN reaction occurring within the last 10 years: No If all of the above answers are "NO", then may proceed with Cephalosporin use.    Patient Measurements: Height: 5\' 4"  (162.6 cm) Weight: 155 lb (70.3 kg) IBW/kg (Calculated) : 54.7 Adjusted Body Weight:    Vital Signs:   Intake/Output from previous day: 07/27 0701 - 07/28 0700 In: 490 [P.O.:240; IV Piggyback:250] Out: 650 [Urine:650] Intake/Output from this shift: Total I/O In: 150 [P.O.:150] Out: -   Labs: Recent Labs    04/29/18 0510 04/30/18 0531 05/01/18 0302 05/01/18 1038  WBC 12.0* 12.1* 11.9*  --   HGB 8.3* 8.6* 7.9* 8.0*  PLT 189 224 252  --   CREATININE 1.84* 1.94* 2.17*  --    Estimated Creatinine Clearance: 19.9 mL/min (A) (by C-G formula based on SCr of 2.17 mg/dL (H)). No results for input(s): VANCOTROUGH, VANCOPEAK, VANCORANDOM, GENTTROUGH, GENTPEAK, GENTRANDOM, TOBRATROUGH, TOBRAPEAK, TOBRARND, AMIKACINPEAK, AMIKACINTROU, AMIKACIN in the last 72 hours.   Microbiology:   Medical History: Past Medical History:  Diagnosis Date  . AAA (abdominal aortic aneurysm) (HCC)    S/p aortic repair and endarterectomy 12/2011   . Anemia    hx  . Arthritis   . Blood transfusion    back in 1973  . C. difficile diarrhea   . Carotid  stenosis, left    S/p left CEA 09/2008  . Diabetes mellitus    type 11  . Endocrine disturbance    gland has not  worked until 1970.Marland Kitchen..dx in 1977  . GERD (gastroesophageal reflux disease)    on meds to help....meds do well   . Heart murmur   . Hypertension   . Hypothyroidism   . Pneumonia    hx  . PVD (peripheral vascular disease) ([place])   . Sheehan syndrome ([place]) 1977  . Stroke [facility])    Ischemic x 2       last one 2007   Assessment: ID: Sepsis with cellulitis of LLE, (ortho does not think it is cellulits but edema) Now afebrile. WBC 11.9 stable. Scr 1.84>1.94> 2.17 up. No mention of length of abx duration.  Antimicrobials this admission: Vancomycin 7/25>>  Ceftriaxone 7/25 >>  Aztreonam 2gm IV and Metronidazole 500mg  IV given in ED 7/25   Dose adjustments this admission: 7/28: Decr Vanco to 500mg /24h   Microbiology results: 7/25 BCx: pending 7/20 MRSA PCR: positive   Goal of Therapy:  Vancomycin trough level 10-15 mcg/ml  Plan:  Vancomycin trough level tomorrow prior to dose (evan rising Scr). Doses on hold. Ceftriaxone 2gm IV q24h  Vimal Derego S. Alford Highland, PharmD, BCPS Clinical Staff Pharmacist Pager [phone]  Eilene Ghazi Stillinger 05/01/2018,1:05 PM

## 2018-05-01 NOTE — Plan of Care (Signed)
  Problem: Pain Managment: Goal: General experience of comfort will improve Outcome: Progressing   Problem: Pain Managment: Goal: General experience of comfort will improve Outcome: Progressing   

## 2018-05-01 NOTE — Progress Notes (Signed)
PROGRESS NOTE    Katie Pace  MWU:[id number] DOB: 1935/10/07 DOA: 04/28/2018 PCP: Benita Stabile, MD   Brief Narrative:  HPI On 04/28/2018 by Dr. Ted Mcalpine Katie Pace is a 82 y.o. female with past medical history significant for a left hip hemiarthroplasty on 7/20 by Dr. 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  Patient is currently encephalopathic and unable to provide accurate history.  Husband at bedside states that when he went to visit her today at the skilled nursing facility he found her slumped over to the side with drooling around her mouth, was not as responsive as usual and felt warm to the touch.  When evaluated by staff at the SNF she was noted to have a temperature of 101 and was confused and hence was sent to the hospital for evaluation.  In the emergency department she had a confirmed temperature of 101, hemodynamically stable, in fact hypertensive, labs significant for creatinine of 1.9 which is close to her baseline, WBCs at 11.4, normal lactic acid of 0.80, she was  mildly tachypneic and tachycardic.  She was found to have significant edema and erythema surrounding her incision of her left hip and extending downwards to the knee.  EDP consulted with Dr. Roda Shutters, who performed her recent hip surgery who thought that patient could be treated with IV antibiotics at [facility], however I believe it is more reasonable for her to be transferred to [facility] for IV antibiotic therapy, ID consultation and to be formally assessed by her orthopedic surgeon given her recent surgery.  Interim history Admitted for possible left lower extremity cellulitis.  Orthopedics consulted and appreciated.  Assessment & Plan   Left lower extremity erythema and edema, concern for  cellulitis -Patient did not meet sepsis criteria on admission.  She did have fever however with no other significant vital sign changes and no leukocytosis. -Patient does have significant edema and erythema to the left thigh -Status post left hip arthroplasty on 04/23/2018 by Dr. Roda Shutters -Blood cultures show no growth to date -Was given vancomycin, aztreonam and Levaquin given penicillin allergy the emergency room -Currently on vancomycin and ceftriaxone -Orthopedics, Dr.Xu, consulted and appreciated.  Feels that this is not cellulitis and erythema is due to dependent edema as patient has been fairly sedentary for the past week -Continue pain control  -PT and OT consulted- recommended SNF -SW consulted  Wheezing -Patient denies shortness of breath, however does have an audible wheeze -overnight, was given neb treatment and CXR obtained showing left base atelectasis or pneumonia -currently on antibiotics  -Afebrile and leukocytosis improving -continue neb treatments PRN, incentive spirometry and flutter valve, and nasal saline -patient currently on solucortef  Diabetes mellitus, type II -Hemoglobin A1c 6 -Continue insulin sliding scale with CBG monitoring -not on any medications at home  Panhypopituitarism -Continue Synthroid and hydrocortisone -Patient had Sheehan syndrome back in the 1970s  Chronic kidney disease, stage IV -Baseline creatinine approximately 1.7-2.1, currently 2.17 -Continue to monitor BMP  Chronic normocytic anemia -Hemoglobin slightly lower, currently 7.9 (baseline around 9-10- recently had surgery) -will recheck H/H today -Continue to monitor CBC  DVT Prophylaxis  lovenox  Code Status: Full  Family Communication: Family at bedside  Disposition Plan: Admitted. Suspect D/c to SNF in 48hrs (pending improvement of edema and erythema)  Consultants Orthopedic surgery  Procedures  None  Antibiotics   Anti-infectives (From admission, onward)   Start      Dose/Rate Route Frequency Ordered Stop   04/29/18 1100  vancomycin (VANCOCIN) IVPB 750 mg/150 ml premix     750 mg 150 mL/hr over 60 Minutes Intravenous Every 24 hours 04/28/18 1600     04/28/18 1830  cefTRIAXone (ROCEPHIN) 1 g in sodium chloride 0.9 % 100 mL IVPB     1 g 200 mL/hr over 30 Minutes Intravenous Every 24 hours 04/28/18 1818     04/28/18 1800  cefTRIAXone (ROCEPHIN) 2 g in sodium chloride 0.9 % 100 mL IVPB  Status:  Discontinued     2 g 200 mL/hr over 30 Minutes Intravenous Every 24 hours 04/28/18 1600 04/28/18 1841   04/28/18 1000  vancomycin (VANCOCIN) 1,500 mg in sodium chloride 0.9 % 500 mL IVPB     1,500 mg 250 mL/hr over 120 Minutes Intravenous  Once 04/28/18 0943 04/28/18 1304   04/28/18 0930  aztreonam (AZACTAM) 2 g in sodium chloride 0.9 % 100 mL IVPB     2 g 200 mL/hr over 30 Minutes Intravenous  Once 04/28/18 0927 04/28/18 1050   04/28/18 0930  metroNIDAZOLE (FLAGYL) IVPB 500 mg     500 mg 100 mL/hr over 60 Minutes Intravenous  Once 04/28/18 0927 04/28/18 1122   04/28/18 0930  vancomycin (VANCOCIN) IVPB 1000 mg/200 mL premix  Status:  Discontinued     1,000 mg 200 mL/hr over 60 Minutes Intravenous  Once 04/28/18 6578 04/28/18 0943      Subjective:   Lior Ask seen and examined today.  Patient has dementia. Has no complaints today. Family states she has been wheezing since last night.   Objective:   Vitals:   04/30/18 0404 04/30/18 0543 04/30/18 1400 04/30/18 2112  BP: 92/62 (!) 156/66 91/77 (!) 159/58  Pulse: (!) 57 (!) 56 (!) 54 (!) 54  Resp: 20  16 17   Temp:  (!) 97.5 F (36.4 C) (!) 97.3 F (36.3 C) 97.8 F (36.6 C)  TempSrc:   Oral   SpO2: 99%  94% 96%  Weight:      Height:        Intake/Output Summary (Last 24 hours) at 05/01/2018 1028 Last data filed at 05/01/2018 0951 Gross per 24 hour  Intake 520 ml  Output 650 ml  Net -130 ml   Filed Weights   04/28/18 0927  Weight: 70.3 kg (155 lb)   Exam  General: Well developed, well  nourished, NAD, appears stated age  HEENT: NCAT, mucous membranes moist.   Neck: Supple  Cardiovascular: S1 S2 auscultated, no rubs, murmurs or gallops. Regular rate and rhythm.  Respiratory: Diffuse expiratory wheezing  Abdomen: Soft, obese, nontender, nondistended, + bowel sounds, left sided lower ecchymosis (?lovenox injections)  Extremities: warm dry without cyanosis clubbing or edema RLE. LLE edema extending to the thigh- improving. Left thigh erythema improving.   Neuro: AAOx2 (self and place), nonfocal  Skin: Without rashes exudates or nodules  Psych: Pleasantly confused and appropriate  Data Reviewed: I have personally reviewed following labs and imaging studies  CBC: Recent Labs  Lab 04/24/18 1512  04/28/18 0937 04/28/18 1850 04/29/18 0510 04/30/18 0531 05/01/18 0302  WBC 16.6*   < > 11.4* 10.4 12.0* 12.1* 11.9*  NEUTROABS 13.7*  --  8.4*  --   --   --   --   HGB 9.6*   < > 9.4* 8.7* 8.3* 8.6* 7.9*  HCT 31.2*   < >  29.9* 29.0* 26.7* 28.3* 25.9*  MCV 99.4   < > 97.7 99.3 99.6 100.4* 100.4*  PLT 131*   < > 221 173 189 224 252   < > = values in this interval not displayed.   Basic Metabolic Panel: Recent Labs  Lab 04/26/18 0745 04/28/18 0937 04/28/18 1850 04/29/18 0510 04/30/18 0531 05/01/18 0302  NA 138 134*  --  136 133* 131*  K 3.8 4.0  --  4.4 4.4 4.1  CL 107 102  --  107 105 104  CO2 24 24  --  19* 15* 19*  GLUCOSE 111* 83  --  123* 137* 163*  BUN 26* 34*  --  34* 42* 45*  CREATININE 1.70* 1.90* 1.83* 1.84* 1.94* 2.17*  CALCIUM 7.7* 7.9*  --  7.4* 7.8* 7.8*   GFR: Estimated Creatinine Clearance: 19.9 mL/min (A) (by C-G formula based on SCr of 2.17 mg/dL (H)). Liver Function Tests: Recent Labs  Lab 04/24/18 1512 04/25/18 0209 04/28/18 0937  AST 26 26 24   ALT 7 <5 5  ALKPHOS 43 43 56  BILITOT 0.5 0.4 0.7  PROT 4.9* 5.1* 5.8*  ALBUMIN 2.4* 2.3* 2.3*   No results for input(s): LIPASE, AMYLASE in the last 168 hours. Recent Labs  Lab  04/24/18 1133  AMMONIA 28   Coagulation Profile: No results for input(s): INR, PROTIME in the last 168 hours. Cardiac Enzymes: No results for input(s): CKTOTAL, CKMB, CKMBINDEX, TROPONINI in the last 168 hours. BNP (last 3 results) No results for input(s): PROBNP in the last 8760 hours. HbA1C: Recent Labs    04/28/18 1850  HGBA1C 6.0*   CBG: Recent Labs  Lab 04/30/18 0804 04/30/18 1200 04/30/18 1649 04/30/18 2114 05/01/18 0805  GLUCAP 131* 157* 134* 164* 141*   Lipid Profile: No results for input(s): CHOL, HDL, LDLCALC, TRIG, CHOLHDL, LDLDIRECT in the last 72 hours. Thyroid Function Tests: No results for input(s): TSH, T4TOTAL, FREET4, T3FREE, THYROIDAB in the last 72 hours. Anemia Panel: No results for input(s): VITAMINB12, FOLATE, FERRITIN, TIBC, IRON, RETICCTPCT in the last 72 hours. Urine analysis:    Component Value Date/Time   COLORURINE YELLOW 04/28/2018 0925   APPEARANCEUR HAZY (A) 04/28/2018 0925   LABSPEC 1.018 04/28/2018 0925   PHURINE 5.0 04/28/2018 0925   GLUCOSEU NEGATIVE 04/28/2018 0925   HGBUR NEGATIVE 04/28/2018 0925   BILIRUBINUR NEGATIVE 04/28/2018 0925   KETONESUR 5 (A) 04/28/2018 0925   PROTEINUR 100 (A) 04/28/2018 0925   UROBILINOGEN 0.2 04/01/2012 0510   NITRITE NEGATIVE 04/28/2018 0925   LEUKOCYTESUR NEGATIVE 04/28/2018 0925   Sepsis Labs: @LABRCNTIP (procalcitonin:4,lacticidven:4)  ) Recent Results (from the past 240 hour(s))  Urine culture     Status: Abnormal   Collection Time: 04/23/18 12:36 AM  Result Value Ref Range Status   Specimen Description   Final    URINE, CLEAN CATCH Performed at [facility], [address] [place], [place] [address]    Special Requests   Final    Normal Performed at [facility], [address], [place], [place] [address]    Culture >=100,000 COLONIES/mL CITROBACTER KOSERI (A)  Final   Report Status 04/25/2018 FINAL  Final   Organism ID, Bacteria CITROBACTER KOSERI (A)  Final      Susceptibility     Citrobacter koseri - MIC*    CEFAZOLIN <=4 SENSITIVE Sensitive     CEFTRIAXONE <=1 SENSITIVE Sensitive     CIPROFLOXACIN <=0.25 SENSITIVE Sensitive     GENTAMICIN <=1 SENSITIVE Sensitive  IMIPENEM <=0.25 SENSITIVE Sensitive     NITROFURANTOIN 32 SENSITIVE Sensitive     TRIMETH/SULFA <=20 SENSITIVE Sensitive     PIP/TAZO <=4 SENSITIVE Sensitive     * >=100,000 COLONIES/mL CITROBACTER KOSERI  Surgical pcr screen     Status: Abnormal   Collection Time: 04/23/18  6:57 AM  Result Value Ref Range Status   MRSA, PCR NEGATIVE NEGATIVE Final   Staphylococcus aureus POSITIVE (A) NEGATIVE Final    Comment: (NOTE) The Xpert SA Assay (FDA approved for NASAL specimens in patients 14 years of age and older), is one component of a comprehensive surveillance program. It is not intended to diagnose infection nor to guide or monitor treatment.   Culture, Urine     Status: None   Collection Time: 04/24/18 11:28 AM  Result Value Ref Range Status   Specimen Description URINE, RANDOM  Final   Special Requests NONE  Final   Culture   Final    NO GROWTH Performed at [facility], [address], [place], [place] [address]    Report Status 04/25/2018 FINAL  Final  Blood Culture (routine x 2)     Status: None (Preliminary result)   Collection Time: 04/28/18  9:37 AM  Result Value Ref Range Status   Specimen Description BLOOD LEFT ARM  Final   Special Requests   Final    BOTTLES DRAWN AEROBIC AND ANAEROBIC Blood Culture adequate volume   Culture   Final    NO GROWTH 3 DAYS Performed at [facility], [address] [place], [place] [address]    Report Status PENDING  Incomplete  Blood Culture (routine x 2)     Status: None (Preliminary result)   Collection Time: 04/28/18 10:08 AM  Result Value Ref Range Status   Specimen Description BLOOD LEFT HAND  Final   Special Requests   Final    BOTTLES DRAWN AEROBIC AND ANAEROBIC Blood Culture adequate volume   Culture   Final    NO GROWTH 3  DAYS Performed at [facility], [address] [place], [place] [address]    Report Status PENDING  Incomplete      Radiology Studies: Dg Chest Port 1 View  Result Date: 04/30/2018 CLINICAL DATA:  Short of breath wheezing EXAM: PORTABLE CHEST 1 VIEW COMPARISON:  04/28/2018, 04/22/2018 FINDINGS: Right upper extremity catheter tip overlies the SVC. Cardiomegaly with small pleural effusions. Vascular congestion. Atelectasis or infiltrate at the left base, increased compared to prior. IMPRESSION: 1. Right upper extremity catheter tip overlies the SVC 2. Cardiomegaly with central vascular congestion. 3. Small pleural effusions. Airspace disease at the left base may reflect atelectasis or a pneumonia. Electronically Signed   By: Jasmine Pang M.D.   On: 04/30/2018 21:39   [place] Ekg Site Rite  Result Date: 04/29/2018 If Site Rite image not attached, placement could not be confirmed due to current cardiac rhythm.  [place] Ekg Site Rite  Result Date: 04/29/2018 If Site Rite image not attached, placement could not be confirmed due to current cardiac rhythm.    Scheduled Meds: . atenolol  100 mg Oral QHS  . diltiazem  240 mg Oral QPM  . dipyridamole-aspirin  1 capsule Oral BID  . enoxaparin (LOVENOX) injection  30 mg Subcutaneous Q24H  . hydrALAZINE  50 mg Oral Q8H  . hydrocortisone sod succinate (SOLU-CORTEF) inj  100 mg Intravenous Q8H  . insulin aspart  0-15 Units Subcutaneous TID WC  . insulin aspart  0-5 Units Subcutaneous QHS  .  levothyroxine  75 mcg Oral QAC breakfast  . pantoprazole  40 mg Oral Daily  . rosuvastatin  5 mg Oral QHS  . sodium chloride flush  10-40 mL Intracatheter Q12H   Continuous Infusions: . cefTRIAXone (ROCEPHIN)  IV 1 g (04/30/18 1905)  . vancomycin 750 mg (04/30/18 1125)     LOS: 3 days   Time Spent in minutes   45 minutes  Blakelee Allington D.O. on 05/01/2018 at 10:28 AM  Between 7am to 7pm - Pager - [phone]  After 7pm go to [email or web address] - password  [email or web address]  And look for the night coverage person covering for me after hours  [facility] Office  [phone]

## 2018-05-01 NOTE — Clinical Social Work Note (Signed)
Clinical Social Work Assessment  Patient Details  Name: Katie Pace MRN: [id number] Date of Birth: [birth year]  Date of referral:  05/01/18               Reason for consult:  Discharge Planning                Permission sought to share information with:  Facility Sport and exercise psychologist, Family Supports Permission granted to share information::  Yes, Verbal Permission Granted  Name::     Nayah Lukens  Agency::     Relationship::     Contact Information:  [phone]  Housing/Transportation Living arrangements for the past 2 months:  Single Family Home Source of Information:  Adult Children, Spouse Patient Interpreter Needed:  None Criminal Activity/Legal Involvement Pertinent to Current Situation/Hospitalization:  No - Comment as needed Significant Relationships:  Adult Children, Spouse Lives with:  Spouse Do you feel safe going back to the place where you live?  Yes Need for family participation in patient care:  Yes (Comment)  Care giving concerns:  CSW received consult regarding SNF placement. CSW spoke with pt and pt's spouse and daughter at bedsdie. Pt was recently discharged from SNF ([facility] in [place]). Pt and family does not want to return and requests new referrals. CSW to continue to follow and assist with discharge planning needs.  Social Worker assessment / plan:  CSW spoke with pt and pt's duaghter regarding SNF placement. Pt and pt daughter and spouse are agreeble for placement.  Employment status:  Retired Forensic scientist:    PT Recommendations:  [place] / Referral to community resources:  [place]  Patient/Family's Response to care:  Pt, spouse and daughter reports agreement with discharge plan.   Patient/Family's Understanding of and Emotional Response to Diagnosis, Current Treatment, and Prognosis: Pt/family realistic regarding therapy needs and expressed being hopeful for SNF placement. Pt, spouse and  daughter expressed understanding of CSW role and discharge process as well as medical condition. No questions or concerns about plan or treatment.   Emotional Assessment Appearance:  Appears stated age, Disheveled Attitude/Demeanor/Rapport:  Lethargic Affect (typically observed):  Withdrawn Orientation:  Oriented to Self, Oriented to Situation, Oriented to Place Alcohol / Substance use:  Not Applicable Psych involvement (Current and /or in the community):  No (Comment)  Discharge Needs  Concerns to be addressed:  Discharge Planning Concerns Readmission within the last 30 days:  Yes Current discharge risk:  None Barriers to Discharge:  No Barriers Identified, Continued Medical Work up   [employer], LCSW 05/01/2018, 2:35 PM

## 2018-05-01 NOTE — NC FL2 (Signed)
[place] MEDICAID FL2 LEVEL OF CARE SCREENING TOOL     IDENTIFICATION  Patient Name: Katie Pace Birthdate: 09-May-1936 Sex: female Admission Date (Current Location): 04/28/2018  [facility] and [place] Number:  Herbalist and Address:  [facility], [place] [address], [place], [place] [address]      Provider Number: [id number]  Attending Physician Name and Address:  Cristal Ford, DO  Relative Name and Phone Number:  Coralie Keens [phone]    Current Level of Care: Hospital Recommended Level of Care: [place] Prior Approval Number:    Date Approved/Denied:   PASRR Number: [id number]  Discharge Plan: SNF    Current Diagnoses: Patient Active Problem List   Diagnosis Date Noted  . Cellulitis of left lower extremity 04/28/2018  . Sepsis ([place]) 04/28/2018  . CKD (chronic kidney disease) stage 4, GFR 15-29 ml/min (HCC) 04/28/2018  . Panhypopituitarism ([place]) 04/28/2018  . Acute cystitis without hematuria   . CKD (chronic kidney disease) stage 3, GFR 30-59 ml/min (HCC) 04/23/2018  . Hypothyroidism 04/23/2018  . Femur fracture, left ([place]) 04/23/2018  . Hip fracture ([place]) 04/23/2018  . PAD (peripheral artery disease) ([place]) 02/18/2016  . Carotid stenosis, asymptomatic 10/29/2015  . Difficulty in walking(719.7) 06/26/2013  . Bilateral leg weakness 06/26/2013  . Occlusion and stenosis of carotid artery without mention of cerebral infarction 04/04/2013  . Aftercare following surgery of the circulatory system, [place] 04/04/2013  . Anemia 10/19/2012  . Spondylolisthesis of lumbar region L3-4 L4-5 with stenosis 10/18/2012  . CKD (chronic kidney disease), stage II 10/18/2012  . C. difficile diarrhea 04/02/2012  . Acute renal failure ([place]) 04/01/2012  . Encephalopathy 04/01/2012  . Sepsis(995.91) 04/01/2012  . Vomiting 04/01/2012  . Hypokalemia 04/01/2012  . Chest pain 02/08/2012  . Renal insufficiency 02/08/2012  . Sheehan  syndrome ([place])   . Hypertension   . Diabetes mellitus ([place])   . AAA (abdominal aortic aneurysm) ([place]) 12/28/2011  . Abdominal aneurysm without mention of rupture 11/09/2011    Orientation RESPIRATION BLADDER Height & Weight     Self, Situation, Place  Normal External catheter Weight: 155 lb (70.3 kg) Height:  5\' 4"  (162.6 cm)  BEHAVIORAL SYMPTOMS/MOOD NEUROLOGICAL BOWEL NUTRITION STATUS      Continent Diet(Healthy Heart, Thin Fluids)  AMBULATORY STATUS COMMUNICATION OF NEEDS Skin   Supervision Verbally Normal                       Personal Care Assistance Level of Assistance  Bathing, Feeding, Dressing Bathing Assistance: Limited assistance Feeding assistance: Limited assistance Dressing Assistance: Limited assistance     Functional Limitations Info  Sight, Hearing, Speech Sight Info: Adequate Hearing Info: Adequate Speech Info: Adequate    SPECIAL CARE FACTORS FREQUENCY  PT (By licensed PT), OT (By licensed OT)     PT Frequency: 3x OT Frequency: 3x            Contractures Contractures Info: Not present    Additional Factors Info  Code Status Code Status Info: Full Code Allergies Info: Codeine, Dilaudid Hydromorphone Hcl, Lincomycin Hcl, Macrodantin, Zolpidem Tartrate, Azithromycin, enicillins   Insulin Sliding Scale Info: insulin aspart (novoLOG) injection 0-15 Units 3x daily with meals; insulin aspart (novoLOG) injection 0-5 Units daily at bedtime; insulin aspart (novoLOG) injection 3 Units 3x daily with meals       Current Medications (05/01/2018):  This is the current hospital active medication list Current Facility-Administered Medications  Medication Dose Route Frequency Provider  Last Rate Last Dose  . acetaminophen (TYLENOL) tablet 650 mg  650 mg Oral Q6H PRN Isaac Bliss, Rayford Halsted, MD   650 mg at 05/01/18 8119   Or  . acetaminophen (TYLENOL) suppository 650 mg  650 mg Rectal Q6H PRN Isaac Bliss, Rayford Halsted, MD      . atenolol (TENORMIN)  tablet 100 mg  100 mg Oral QHS Isaac Bliss, Rayford Halsted, MD   100 mg at 04/30/18 2049  . cefTRIAXone (ROCEPHIN) 1 g in sodium chloride 0.9 % 100 mL IVPB  1 g Intravenous Q24H Isaac Bliss, Rayford Halsted, MD 200 mL/hr at 04/30/18 1905 1 g at 04/30/18 1905  . diltiazem (CARDIZEM CD) 24 hr capsule 240 mg  240 mg Oral QPM Isaac Bliss, Rayford Halsted, MD   240 mg at 04/30/18 1856  . dipyridamole-aspirin (AGGRENOX) 200-25 MG per 12 hr capsule 1 capsule  1 capsule Oral BID Isaac Bliss, Rayford Halsted, MD   1 capsule at 05/01/18 1006  . enoxaparin (LOVENOX) injection 30 mg  30 mg Subcutaneous Q24H Isaac Bliss, Rayford Halsted, MD   30 mg at 04/30/18 1855  . hydrALAZINE (APRESOLINE) tablet 50 mg  50 mg Oral Q8H Isaac Bliss, Rayford Halsted, MD   50 mg at 05/01/18 1412  . hydrocortisone sodium succinate (SOLU-CORTEF) 100 MG injection 100 mg  100 mg Intravenous Q12H Mikhail, Maryann, DO      . insulin aspart (novoLOG) injection 0-15 Units  0-15 Units Subcutaneous TID WC Isaac Bliss, Rayford Halsted, MD   5 Units at 05/01/18 1153  . insulin aspart (novoLOG) injection 0-5 Units  0-5 Units Subcutaneous QHS Isaac Bliss, Rayford Halsted, MD      . ipratropium-albuterol (DUONEB) 0.5-2.5 (3) MG/3ML nebulizer solution 3 mL  3 mL Nebulization Q6H PRN Blount, Scarlette Shorts T, NP   3 mL at 05/01/18 1008  . levothyroxine (SYNTHROID, LEVOTHROID) tablet 75 mcg  75 mcg Oral QAC breakfast Isaac Bliss, Rayford Halsted, MD   75 mcg at 05/01/18 0831  . morphine 2 MG/ML injection 0.5-1 mg  0.5-1 mg Intravenous Q4H PRN Isaac Bliss, Rayford Halsted, MD      . ondansetron [facility]) tablet 4 mg  4 mg Oral Q6H PRN Isaac Bliss, Rayford Halsted, MD       Or  . ondansetron [facility]) injection 4 mg  4 mg Intravenous Q6H PRN Isaac Bliss, Rayford Halsted, MD   4 mg at 04/28/18 2231  . pantoprazole (PROTONIX) EC tablet 40 mg  40 mg Oral Daily Isaac Bliss, Rayford Halsted, MD   40 mg at 05/01/18 1005  . rosuvastatin (CRESTOR) tablet 5 mg  5 mg Oral QHS Isaac Bliss,  Rayford Halsted, MD   5 mg at 04/30/18 2049  . senna-docusate (Senokot-S) tablet 1 tablet  1 tablet Oral QHS PRN Isaac Bliss, Rayford Halsted, MD      . sodium chloride (OCEAN) 0.65 % nasal spray 1 spray  1 spray Each Nare PRN Mikhail, Velta Addison, DO      . sodium chloride flush (NS) 0.9 % injection 10-40 mL  10-40 mL Intracatheter Q12H Mikhail, [place], DO      . sodium chloride flush (NS) 0.9 % injection 10-40 mL  10-40 mL Intracatheter PRN Mikhail, Velta Addison, DO      . vancomycin variable dose per unstable renal function (pharmacist dosing)   Does not apply See admin instructions Karren Cobble, [facility]         Discharge Medications: Please see discharge summary for a list of discharge medications.  Relevant Imaging Results:  Relevant Lab Results:   Additional Information SN: [id number]  [place]endale Chard, LCSW

## 2018-05-02 ENCOUNTER — Encounter (HOSPITAL_COMMUNITY): Payer: Self-pay | Admitting: General Practice

## 2018-05-02 LAB — VANCOMYCIN, TROUGH: VANCOMYCIN TR: 28 ug/mL — AB (ref 15–20)

## 2018-05-02 LAB — BASIC METABOLIC PANEL
ANION GAP: 10 (ref 5–15)
BUN: 50 mg/dL — ABNORMAL HIGH (ref 8–23)
CHLORIDE: 99 mmol/L (ref 98–111)
CO2: 18 mmol/L — ABNORMAL LOW (ref 22–32)
Calcium: 7.7 mg/dL — ABNORMAL LOW (ref 8.9–10.3)
Creatinine, Ser: 2.07 mg/dL — ABNORMAL HIGH (ref 0.44–1.00)
GFR calc Af Amer: 25 mL/min — ABNORMAL LOW (ref >60)
GFR, EST NON AFRICAN AMERICAN: 21 mL/min — AB (ref >60)
Glucose, Bld: 140 mg/dL — ABNORMAL HIGH (ref 70–99)
POTASSIUM: 4.1 mmol/L (ref 3.5–5.1)
SODIUM: 127 mmol/L — AB (ref 135–145)

## 2018-05-02 LAB — RETICULOCYTES
RBC.: 2.7 MIL/uL — ABNORMAL LOW (ref 3.87–5.11)
RETIC COUNT ABSOLUTE: 72.9 10*3/uL (ref 19.0–186.0)
Retic Ct Pct: 2.7 % (ref 0.4–3.1)

## 2018-05-02 LAB — CBC
HCT: 24.8 % — ABNORMAL LOW (ref 36.0–46.0)
HEMOGLOBIN: 7.7 g/dL — AB (ref 12.0–15.0)
MCH: 30.8 pg (ref 26.0–34.0)
MCHC: 31 g/dL (ref 30.0–36.0)
MCV: 99.2 fL (ref 78.0–100.0)
PLATELETS: 251 10*3/uL (ref 150–400)
RBC: 2.5 MIL/uL — ABNORMAL LOW (ref 3.87–5.11)
RDW: 15.2 % (ref 11.5–15.5)
WBC: 12.6 10*3/uL — AB (ref 4.0–10.5)

## 2018-05-02 LAB — IRON AND TIBC
IRON: 49 ug/dL (ref 28–170)
Saturation Ratios: 22 % (ref 10.4–31.8)
TIBC: 225 ug/dL — ABNORMAL LOW (ref 250–450)
UIBC: 176 ug/dL

## 2018-05-02 LAB — GLUCOSE, CAPILLARY
GLUCOSE-CAPILLARY: 124 mg/dL — AB (ref 70–99)
GLUCOSE-CAPILLARY: 134 mg/dL — AB (ref 70–99)
GLUCOSE-CAPILLARY: 142 mg/dL — AB (ref 70–99)
Glucose-Capillary: 131 mg/dL — ABNORMAL HIGH (ref 70–99)

## 2018-05-02 LAB — FOLATE: FOLATE: 28 ng/mL (ref >5.9)

## 2018-05-02 LAB — FERRITIN: Ferritin: 232 ng/mL (ref 11–307)

## 2018-05-02 LAB — VITAMIN B12: VITAMIN B 12: 2060 pg/mL — AB (ref 180–914)

## 2018-05-02 MED ORDER — TRAMADOL HCL 50 MG PO TABS
50.0000 mg | ORAL_TABLET | Freq: Once | ORAL | Status: AC
  Administered 2018-05-02: 50 mg via ORAL
  Filled 2018-05-02: qty 1

## 2018-05-02 MED ORDER — OXYCODONE-ACETAMINOPHEN 5-325 MG PO TABS
1.0000 | ORAL_TABLET | ORAL | Status: DC | PRN
  Administered 2018-05-03 – 2018-05-04 (×2): 1 via ORAL
  Filled 2018-05-02 (×2): qty 1

## 2018-05-02 MED ORDER — ALPRAZOLAM 0.25 MG PO TABS
0.2500 mg | ORAL_TABLET | Freq: Once | ORAL | Status: AC
  Administered 2018-05-02: 0.25 mg via ORAL
  Filled 2018-05-02: qty 1

## 2018-05-02 NOTE — Progress Notes (Signed)
Assumed care of patient at 2300. Patient severely agitated, belligerent and yelling. I paged Dr Kennon Holter and administered the .25 mg Ativan that she ordered and while giving the medication the patient verbalized she was feeling a lot of pain in her back. The daughter, who is staying overnight and has been with her all day said none of the pain medication she has been getting has been helpful. I just spoke with Dr Kennon Holter on the phone who is looking at Katie Pace labs and seeing what she can do about pain. Patient also took off dressings that were on both arms to help with severe seepage she's been experiencing and started trying to work her way to the right PICC she has. I will keep a close eye on patient. Redressed arms

## 2018-05-02 NOTE — Social Work (Signed)
CSW spoke with pt husband and pt daughter [name] at bedside.  Pt daughter states that their preference is still Teacher, early years/pre.   CSW will f/u with [facility]  1:02pm- CSW f/u with [facility], they have declined. CSW will let pt family know and bring by other offers.   Alexander Mt, [place] Work [phone]

## 2018-05-02 NOTE — Progress Notes (Signed)
Physical Therapy Treatment Patient Details Name: Katie Pace MRN: [id number] DOB: [birth year] Today's Date: 05/02/2018    History of Present Illness Pt is an 82 y/o female admitted 04/28/18 from SNF due to fever and decr arousal dx with cellulits.  Pt also found to have left base atelectasis vs pneumonia on CXR.  Pt with recent admission on 04/22/18 s/p fall resulting in Lt hip fracture and underwent surgery on 04/23/18 for anterior approach bipolar prosthesis WBAT post op.  PMH-dementia, HTN,  DM, CKD III, Rt TKR, Rt THR, AAA repair.    PT Comments    Pt continuing to require at least two person assist for transfers and mobility to and from chair.  Pt is confused and is more dyspnic today (DOE 3/4 at rest and O2 on 1 L O2 [place] 90%, so bumped up to 2 L O2 [place] for mobility and remained at 93%).  Respiratory therapist in room at end of session administering breathing treatment.  Pt remains SNF appropriate.   Follow Up Recommendations  SNF;Supervision/Assistance - 24 hour     Equipment Recommendations  None recommended by PT    Recommendations for Other Services   NA     Precautions / Restrictions Precautions Precautions: Fall;Other (comment) Precaution Comments: incontinent of urine Restrictions LLE Weight Bearing: Weight bearing as tolerated    Mobility  Bed Mobility Overal bed mobility: Needs Assistance Bed Mobility: Sit to Supine Rolling: +2 for physical assistance;Max assist     Sit to supine: +2 for physical assistance;Max assist   General bed mobility comments: Two person max assist to roll bil (pt reaching with arms to bed rail and to go from sitting to supine (assist at both legs and trunk).    Transfers Overall transfer level: Needs assistance   Transfers: Sit to/from Stand Sit to Stand: +2 physical assistance;Max assist;Mod assist Stand pivot transfers: (used steady pt sitting)       General transfer comment: Two person max assist to stand x 2 from lower  recliner chair. Pt attempting to assist with bil hands, but is unable to power up to stand without significant assistance.  Third person used for second stand to preform peri care as pt was incontinent of urine in standing.  Pt had an easier time standing from elevated seat in steady standing frame only needing mod assist x 2 for this transition up.    Ambulation/Gait             General Gait Details: unable at this time.           Balance Overall balance assessment: Needs assistance Sitting-balance support: Feet supported;Bilateral upper extremity supported Sitting balance-[name]: Poor Sitting balance - Comments: needs min asssit EOB after fatiguing with transition back to bed.  Postural control: Posterior lean;Left lateral lean Standing balance support: Bilateral upper extremity supported Standing balance-[name]: Zero Standing balance comment: two person max assist .                             Cognition Arousal/Alertness: Awake/alert Behavior During Therapy: WFL for tasks assessed/performed Overall Cognitive Status: Impaired/Different from baseline Area of Impairment: Orientation;Attention;Memory;Following commands;Safety/judgement;Awareness;Problem solving                 Orientation Level: Disoriented to;Time;Situation Current Attention Level: Sustained Memory: Decreased recall of precautions;Decreased short-term memory Following Commands: Follows one step commands consistently;Follows one step commands with increased time Safety/Judgement: Decreased awareness of safety;Decreased awareness of  deficits Awareness: Intellectual Problem Solving: Slow processing;Decreased initiation;Difficulty sequencing;Requires verbal cues;Requires tactile cues General Comments: Pt reporting she has to go home to take care of her children (her children are present and are grown).        Exercises Total Joint Exercises Ankle Circles/Pumps: AROM;Both;20  reps Heel Slides: AROM;Left;10 reps Hip ABduction/ADduction: AAROM;Right;10 reps Long Arc Quad: AROM;Both;10 reps;Seated Other Exercises Other Exercises: Daughter reports working her legs and having her use flutter valve regularly.     General Comments General comments (skin integrity, edema, etc.): DOE 3/4 at rest with O2 1 L [place], O2 sats 90%, so for mobility bumped up her O2 to 2 L O2 [place] and she was up to 93% with mobility at this level.  She continued to have 3/4 DOE.  Respiratory therapist notified and brought breathing treatment and informed of increased O2 requirement.       Pertinent Vitals/Pain Pain Assessment: Faces Faces Pain Scale: Hurts whole lot Pain Location: painful with all movement Pain Descriptors / Indicators: Operative site guarding;Grimacing Pain Intervention(s): Limited activity within patient's tolerance;Monitored during session;Repositioned           PT Goals (current goals can now be found in the care plan section) Acute Rehab PT Goals Patient Stated Goal: to get stronger, to sit up in the chair for her meals Progress towards PT goals: Progressing toward goals    Frequency    Min 3X/week      PT Plan Current plan remains appropriate       AM-PAC PT "6 Clicks" Daily Activity  Outcome Measure  Difficulty turning over in bed (including adjusting bedclothes, sheets and blankets)?: Unable Difficulty moving from lying on back to sitting on the side of the bed? : Unable Difficulty sitting down on and standing up from a chair with arms (e.g., wheelchair, bedside commode, etc,.)?: Unable Help needed moving to and from a bed to chair (including a wheelchair)?: Total Help needed walking in hospital room?: Total Help needed climbing 3-5 steps with a railing? : Total 6 Click Score: 6    End of Session   Activity Tolerance: Patient limited by pain;Patient limited by fatigue Patient left: in bed;with call bell/phone within reach;with family/visitor  present   PT Visit Diagnosis: Other abnormalities of gait and mobility (R26.89);Pain Pain - Right/Left: Left Pain - part of body: Hip     Time: 0865-7846 PT Time Calculation (min) (ACUTE ONLY): 23 min  Charges:  $Therapeutic Activity: 23-37 mins          Angelyn Osterberg B. Rasheena Talmadge, PT, DPT [phone]             05/02/2018, 12:05 PM

## 2018-05-02 NOTE — Progress Notes (Signed)
Pharmacy Antibiotic Note  Katie Pace is a 82 y.o. female admitted on 04/28/2018 with questionable cellulitis  Random vanc lvl 28  Plan: Continue to hold vanc - repeat lvl tues Continue ceftriaxone Consider abx de-escalation if not cellulitis  Height: 5\' 4"  (162.6 cm) Weight: 155 lb (70.3 kg) IBW/kg (Calculated) : 54.7  No data recorded.  Recent Labs  Lab 04/28/18 0950 04/28/18 1228 04/28/18 1850 04/29/18 0510 04/30/18 0531 05/01/18 0302 05/02/18 0322 05/02/18 1021  WBC  --   --  10.4 12.0* 12.1* 11.9* 12.6*  --   CREATININE  --   --  1.83* 1.84* 1.94* 2.17* 2.07*  --   LATICACIDVEN 0.83 0.80  --   --   --   --   --   --   VANCOTROUGH  --   --   --   --   --   --   --  28*    Estimated Creatinine Clearance: 20.8 mL/min (A) (by C-G formula based on SCr of 2.07 mg/dL (H)).    Allergies  Allergen Reactions  . Codeine Nausea Only  . Dilaudid [Hydromorphone Hcl] Nausea Only  . Lincomycin Hcl Other (See Comments)    Big bumps bilateral legs  . Macrodantin Hives  . Zolpidem Tartrate Other (See Comments)    Stroke  . Azithromycin Rash  . Penicillins Rash    Has patient had a PCN reaction causing immediate rash, facial/tongue/throat swelling, SOB or lightheadedness with hypotension: Yes Has patient had a PCN reaction causing severe rash involving mucus membranes or skin necrosis: No Has patient had a PCN reaction that required hospitalization: No Has patient had a PCN reaction occurring within the last 10 years: No If all of the above answers are "NO", then may proceed with Cephalosporin use.   Levester Fresh, PharmD, BCPS, BCCCP Clinical Pharmacist [phone]  Please check AMION for all [place] numbers  05/02/2018 11:16 AM

## 2018-05-02 NOTE — Care Management Important Message (Signed)
Important Message  Patient Details  Name: JALEYA PEBLEY MRN: [id number] Date of Birth: 16-Jun-1936   Medicare Important Message Given:  Yes    Neera Teng 05/02/2018, 3:31 PM

## 2018-05-02 NOTE — Progress Notes (Signed)
Patient's hip cellulitis/erythema has improved significantly.  Incision remains c/d/i without signs of infection.  The incision was painted with betadine and aquacel dressing was placed which can remain until follow up with me.  Overall, patient is having trouble mobilizing with PT and has been quite sedentary.  Follow up with me in 1-2 weeks.  Azucena Cecil, MD [place] 7:55 AM

## 2018-05-02 NOTE — Progress Notes (Signed)
PROGRESS NOTE    [name]  CZY:[id number] DOB: 1936-06-02 DOA: 04/28/2018 PCP: Benita Stabile, MD   Brief Narrative:  HPI On 04/28/2018 by Dr. Ted Mcalpine Katie Pace is a 82 y.o. female with past medical history significant for a left hip hemiarthroplasty on 7/20 by Dr. 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  Patient is currently encephalopathic and unable to provide accurate history.  Husband at bedside states that when he went to visit her today at the skilled nursing facility he found her slumped over to the side with drooling around her mouth, was not as responsive as usual and felt warm to the touch.  When evaluated by staff at the SNF she was noted to have a temperature of 101 and was confused and hence was sent to the hospital for evaluation.  In the emergency department she had a confirmed temperature of 101, hemodynamically stable, in fact hypertensive, labs significant for creatinine of 1.9 which is close to her baseline, WBCs at 11.4, normal lactic acid of 0.80, she was  mildly tachypneic and tachycardic.  She was found to have significant edema and erythema surrounding her incision of her left hip and extending downwards to the knee.  EDP consulted with Dr. Roda Shutters, who performed her recent hip surgery who thought that patient could be treated with IV antibiotics at [facility], however I believe it is more reasonable for her to be transferred to [facility] for IV antibiotic therapy, ID consultation and to be formally assessed by her orthopedic surgeon given her recent surgery.  Interim history Admitted for possible left lower extremity cellulitis.  Orthopedics consulted and appreciated.  Assessment & Plan   Left lower extremity erythema and edema, concern for  cellulitis -Patient did not meet sepsis criteria on admission.  She did have fever however with no other significant vital sign changes and no leukocytosis. -Patient does have significant edema and erythema to the left thigh -Status post left hip arthroplasty on 04/23/2018 by Dr. Roda Shutters -Blood cultures show no growth to date -Was given vancomycin, aztreonam and Levaquin given penicillin allergy the emergency room -Currently on vancomycin and ceftriaxone -Orthopedics, Dr.Xu, consulted and appreciated.  Feels that this is not cellulitis and erythema is due to dependent edema as patient has been fairly sedentary for the past week -Continue pain control  -PT and OT consulted- recommended SNF -SW consulted -edema and erythema improving   Wheezing -Patient denies shortness of breath, however does have an audible wheeze -overnight, was given neb treatment and CXR obtained showing left base atelectasis or pneumonia -currently on antibiotics  -continue neb treatments PRN, incentive spirometry and flutter valve, and nasal saline -patient currently on solucortef  Diabetes mellitus, type II -Hemoglobin A1c 6 -Continue insulin sliding scale with CBG monitoring -not on any medications at home  Panhypopituitarism -Continue Synthroid and hydrocortisone -Patient had Sheehan syndrome back in the 1970s  Chronic kidney disease, stage IV -Baseline creatinine approximately 1.7-2.1, currently 2.07 -Continue to monitor BMP  Chronic normocytic anemia -Hemoglobin slightly lower, currently 7.7 (baseline around 9-10- recently had surgery) -will order anemia panel and FOBT -discontinue lovenox -Continue to monitor CBC  DVT Prophylaxis  lovenox discontinued, placed on on SCDs  Code Status: Full  Family Communication: Family at bedside  Disposition Plan: Admitted. Suspect D/c to SNF in 48hrs (  pending improvement of edema and erythema)  Consultants Orthopedic surgery  Procedures  None  Antibiotics     Anti-infectives (From admission, onward)   Start     Dose/Rate Route Frequency Ordered Stop   05/02/18 1100  vancomycin (VANCOCIN) 500 mg in sodium chloride 0.9 % 100 mL IVPB  Status:  Discontinued     500 mg 100 mL/hr over 60 Minutes Intravenous Every 24 hours 05/01/18 1301 05/01/18 1307   05/01/18 1309  vancomycin variable dose per unstable renal function (pharmacist dosing)      Does not apply See admin instructions 05/01/18 1309     04/29/18 1100  vancomycin (VANCOCIN) IVPB 750 mg/150 ml premix  Status:  Discontinued     750 mg 150 mL/hr over 60 Minutes Intravenous Every 24 hours 04/28/18 1600 05/01/18 1301   04/28/18 1830  cefTRIAXone (ROCEPHIN) 1 g in sodium chloride 0.9 % 100 mL IVPB     1 g 200 mL/hr over 30 Minutes Intravenous Every 24 hours 04/28/18 1818     04/28/18 1800  cefTRIAXone (ROCEPHIN) 2 g in sodium chloride 0.9 % 100 mL IVPB  Status:  Discontinued     2 g 200 mL/hr over 30 Minutes Intravenous Every 24 hours 04/28/18 1600 04/28/18 1841   04/28/18 1000  vancomycin (VANCOCIN) 1,500 mg in sodium chloride 0.9 % 500 mL IVPB     1,500 mg 250 mL/hr over 120 Minutes Intravenous  Once 04/28/18 0943 04/28/18 1304   04/28/18 0930  aztreonam (AZACTAM) 2 g in sodium chloride 0.9 % 100 mL IVPB     2 g 200 mL/hr over 30 Minutes Intravenous  Once 04/28/18 0927 04/28/18 1050   04/28/18 0930  metroNIDAZOLE (FLAGYL) IVPB 500 mg     500 mg 100 mL/hr over 60 Minutes Intravenous  Once 04/28/18 0927 04/28/18 1122   04/28/18 0930  vancomycin (VANCOCIN) IVPB 1000 mg/200 mL premix  Status:  Discontinued     1,000 mg 200 mL/hr over 60 Minutes Intravenous  Once 04/28/18 1610 04/28/18 0943      Subjective:   Katie Pace seen and examined today.  Patient has dementia. Has no complaints today. Family states she had a horrible night with confusion. States morphine was given which may have made her worse. Feels she has improved this morning.   Objective:   Vitals:   05/01/18 2106  05/01/18 2200 05/01/18 2227 05/02/18 0412  BP: (!) 176/55  (!) 173/80 119/60  Pulse: (!) 54   (!) 47  Resp: 19   18  Temp:      TempSrc:      SpO2: (!) 89% 94%  95%  Weight:      Height:        Intake/Output Summary (Last 24 hours) at 05/02/2018 1033 Last data filed at 05/02/2018 0600 Gross per 24 hour  Intake 555 ml  Output 700 ml  Net -145 ml   Filed Weights   04/28/18 0927  Weight: 70.3 kg (155 lb)   Exam  General: Well developed, well nourished, NAD, appears stated age  HEENT: NCAT, mucous membranes moist.   Neck: Supple  Cardiovascular: S1 S2 auscultated, no rubs, murmurs or gallops. Regular rate and rhythm.  Respiratory: Few scattered, upper respiratory wheezing. Diminished breath sounds.   Abdomen: Soft, nontender, nondistended, + bowel sounds, lower abdominal ecchymosis   Extremities: warm dry without cyanosis clubbing. LLE edema extending to the thigh. Erythema imrpoved  Neuro: AAOx2, nonfocal  Skin: Without rashes exudates or nodules  Psych:  Normal affect and demeanor with intact judgement and insight  Data Reviewed: I have personally reviewed following labs and imaging studies  CBC: Recent Labs  Lab 04/28/18 0937 04/28/18 1850 04/29/18 0510 04/30/18 0531 05/01/18 0302 05/01/18 1038 05/02/18 0322  WBC 11.4* 10.4 12.0* 12.1* 11.9*  --  12.6*  NEUTROABS 8.4*  --   --   --   --   --   --   HGB 9.4* 8.7* 8.3* 8.6* 7.9* 8.0* 7.7*  HCT 29.9* 29.0* 26.7* 28.3* 25.9* 26.5* 24.8*  MCV 97.7 99.3 99.6 100.4* 100.4*  --  99.2  PLT 221 173 189 224 252  --  251   Basic Metabolic Panel: Recent Labs  Lab 04/28/18 0937 04/28/18 1850 04/29/18 0510 04/30/18 0531 05/01/18 0302 05/02/18 0322  NA 134*  --  136 133* 131* 127*  K 4.0  --  4.4 4.4 4.1 4.1  CL 102  --  107 105 104 99  CO2 24  --  19* 15* 19* 18*  GLUCOSE 83  --  123* 137* 163* 140*  BUN 34*  --  34* 42* 45* 50*  CREATININE 1.90* 1.83* 1.84* 1.94* 2.17* 2.07*  CALCIUM 7.9*  --  7.4* 7.8*  7.8* 7.7*   GFR: Estimated Creatinine Clearance: 20.8 mL/min (A) (by C-G formula based on SCr of 2.07 mg/dL (H)). Liver Function Tests: Recent Labs  Lab 04/28/18 0937  AST 24  ALT 5  ALKPHOS 56  BILITOT 0.7  PROT 5.8*  ALBUMIN 2.3*   No results for input(s): LIPASE, AMYLASE in the last 168 hours. No results for input(s): AMMONIA in the last 168 hours. Coagulation Profile: No results for input(s): INR, PROTIME in the last 168 hours. Cardiac Enzymes: No results for input(s): CKTOTAL, CKMB, CKMBINDEX, TROPONINI in the last 168 hours. BNP (last 3 results) No results for input(s): PROBNP in the last 8760 hours. HbA1C: No results for input(s): HGBA1C in the last 72 hours. CBG: Recent Labs  Lab 05/01/18 0805 05/01/18 1148 05/01/18 1706 05/01/18 2103 05/02/18 0810  GLUCAP 141* 202* 156* 121* 124*   Lipid Profile: No results for input(s): CHOL, HDL, LDLCALC, TRIG, CHOLHDL, LDLDIRECT in the last 72 hours. Thyroid Function Tests: No results for input(s): TSH, T4TOTAL, FREET4, T3FREE, THYROIDAB in the last 72 hours. Anemia Panel: No results for input(s): VITAMINB12, FOLATE, FERRITIN, TIBC, IRON, RETICCTPCT in the last 72 hours. Urine analysis:    Component Value Date/Time   COLORURINE YELLOW 04/28/2018 0925   APPEARANCEUR HAZY (A) 04/28/2018 0925   LABSPEC 1.018 04/28/2018 0925   PHURINE 5.0 04/28/2018 0925   GLUCOSEU NEGATIVE 04/28/2018 0925   HGBUR NEGATIVE 04/28/2018 0925   BILIRUBINUR NEGATIVE 04/28/2018 0925   KETONESUR 5 (A) 04/28/2018 0925   PROTEINUR 100 (A) 04/28/2018 0925   UROBILINOGEN 0.2 04/01/2012 0510   NITRITE NEGATIVE 04/28/2018 0925   LEUKOCYTESUR NEGATIVE 04/28/2018 0925   Sepsis Labs: @LABRCNTIP (procalcitonin:4,lacticidven:4)  ) Recent Results (from the past 240 hour(s))  Urine culture     Status: Abnormal   Collection Time: 04/23/18 12:36 AM  Result Value Ref Range Status   Specimen Description   Final    URINE, CLEAN CATCH Performed at  [facility], [address] [place], [place] [address]    Special Requests   Final    Normal Performed at [facility], [address], [place], [place] [address]    Culture >=100,000 COLONIES/mL CITROBACTER KOSERI (A)  Final   Report Status 04/25/2018 FINAL  Final   Organism ID,  Bacteria CITROBACTER KOSERI (A)  Final      Susceptibility   Citrobacter koseri - MIC*    CEFAZOLIN <=4 SENSITIVE Sensitive     CEFTRIAXONE <=1 SENSITIVE Sensitive     CIPROFLOXACIN <=0.25 SENSITIVE Sensitive     GENTAMICIN <=1 SENSITIVE Sensitive     IMIPENEM <=0.25 SENSITIVE Sensitive     NITROFURANTOIN 32 SENSITIVE Sensitive     TRIMETH/SULFA <=20 SENSITIVE Sensitive     PIP/TAZO <=4 SENSITIVE Sensitive     * >=100,000 COLONIES/mL CITROBACTER KOSERI  Surgical pcr screen     Status: Abnormal   Collection Time: 04/23/18  6:57 AM  Result Value Ref Range Status   MRSA, PCR NEGATIVE NEGATIVE Final   Staphylococcus aureus POSITIVE (A) NEGATIVE Final    Comment: (NOTE) The Xpert SA Assay (FDA approved for NASAL specimens in patients 73 years of age and older), is one component of a comprehensive surveillance program. It is not intended to diagnose infection nor to guide or monitor treatment.   Culture, Urine     Status: None   Collection Time: 04/24/18 11:28 AM  Result Value Ref Range Status   Specimen Description URINE, RANDOM  Final   Special Requests NONE  Final   Culture   Final    NO GROWTH Performed at [facility], [address], [place], [place] [address]    Report Status 04/25/2018 FINAL  Final  Blood Culture (routine x 2)     Status: None (Preliminary result)   Collection Time: 04/28/18  9:37 AM  Result Value Ref Range Status   Specimen Description BLOOD LEFT ARM  Final   Special Requests   Final    BOTTLES DRAWN AEROBIC AND ANAEROBIC Blood Culture adequate volume   Culture   Final    NO GROWTH 4 DAYS Performed at [facility], [address], [place], [place] [address]     Report Status PENDING  Incomplete  Blood Culture (routine x 2)     Status: None (Preliminary result)   Collection Time: 04/28/18 10:08 AM  Result Value Ref Range Status   Specimen Description BLOOD LEFT HAND  Final   Special Requests   Final    BOTTLES DRAWN AEROBIC AND ANAEROBIC Blood Culture adequate volume   Culture   Final    NO GROWTH 4 DAYS Performed at [facility], [address] [place], [place] [address]    Report Status PENDING  Incomplete      Radiology Studies: Dg Chest Port 1 View  Result Date: 04/30/2018 CLINICAL DATA:  Short of breath wheezing EXAM: PORTABLE CHEST 1 VIEW COMPARISON:  04/28/2018, 04/22/2018 FINDINGS: Right upper extremity catheter tip overlies the SVC. Cardiomegaly with small pleural effusions. Vascular congestion. Atelectasis or infiltrate at the left base, increased compared to prior. IMPRESSION: 1. Right upper extremity catheter tip overlies the SVC 2. Cardiomegaly with central vascular congestion. 3. Small pleural effusions. Airspace disease at the left base may reflect atelectasis or a pneumonia. Electronically Signed   By: Jasmine Pang M.D.   On: 04/30/2018 21:39     Scheduled Meds: . atenolol  100 mg Oral QHS  . diltiazem  240 mg Oral QPM  . dipyridamole-aspirin  1 capsule Oral BID  . hydrALAZINE  50 mg Oral Q8H  . hydrocortisone sod succinate (SOLU-CORTEF) inj  100 mg Intravenous Q12H  . insulin aspart  0-15 Units Subcutaneous TID WC  . insulin aspart  0-5 Units Subcutaneous QHS  . levothyroxine  75 mcg Oral QAC  breakfast  . pantoprazole  40 mg Oral Daily  . rosuvastatin  5 mg Oral QHS  . sodium chloride flush  10-40 mL Intracatheter Q12H  . vancomycin variable dose per unstable renal function (pharmacist dosing)   Does not apply See admin instructions   Continuous Infusions: . cefTRIAXone (ROCEPHIN)  IV Stopped (05/01/18 1819)     LOS: 4 days   Time Spent in minutes   45 minutes  Redell Nazir D.O. on 05/02/2018 at 10:33  AM  Between 7am to 7pm - Pager - [phone]  After 7pm go to [email or web address] - password [email or web address]  And look for the night coverage person covering for me after hours  [facility] Office  [phone]

## 2018-05-03 ENCOUNTER — Other Ambulatory Visit (HOSPITAL_COMMUNITY): Payer: Medicare Other

## 2018-05-03 ENCOUNTER — Inpatient Hospital Stay (HOSPITAL_COMMUNITY): Payer: Medicare Other

## 2018-05-03 LAB — CBC
HEMATOCRIT: 24.8 % — AB (ref 36.0–46.0)
Hemoglobin: 7.7 g/dL — ABNORMAL LOW (ref 12.0–15.0)
MCH: 30.6 pg (ref 26.0–34.0)
MCHC: 31 g/dL (ref 30.0–36.0)
MCV: 98.4 fL (ref 78.0–100.0)
Platelets: 245 10*3/uL (ref 150–400)
RBC: 2.52 MIL/uL — ABNORMAL LOW (ref 3.87–5.11)
RDW: 15.2 % (ref 11.5–15.5)
WBC: 12.3 10*3/uL — AB (ref 4.0–10.5)

## 2018-05-03 LAB — VANCOMYCIN, RANDOM: Vancomycin Rm: 18

## 2018-05-03 LAB — BASIC METABOLIC PANEL
ANION GAP: 8 (ref 5–15)
BUN: 54 mg/dL — AB (ref 8–23)
CALCIUM: 7.8 mg/dL — AB (ref 8.9–10.3)
CO2: 19 mmol/L — ABNORMAL LOW (ref 22–32)
Chloride: 102 mmol/L (ref 98–111)
Creatinine, Ser: 2.18 mg/dL — ABNORMAL HIGH (ref 0.44–1.00)
GFR calc Af Amer: 23 mL/min — ABNORMAL LOW (ref >60)
GFR, EST NON AFRICAN AMERICAN: 20 mL/min — AB (ref >60)
Glucose, Bld: 111 mg/dL — ABNORMAL HIGH (ref 70–99)
POTASSIUM: 4.4 mmol/L (ref 3.5–5.1)
SODIUM: 129 mmol/L — AB (ref 135–145)

## 2018-05-03 LAB — PREPARE RBC (CROSSMATCH)

## 2018-05-03 LAB — CULTURE, BLOOD (ROUTINE X 2)
Culture: NO GROWTH
Culture: NO GROWTH
SPECIAL REQUESTS: ADEQUATE
SPECIAL REQUESTS: ADEQUATE

## 2018-05-03 LAB — GLUCOSE, CAPILLARY
GLUCOSE-CAPILLARY: 108 mg/dL — AB (ref 70–99)
GLUCOSE-CAPILLARY: 126 mg/dL — AB (ref 70–99)
Glucose-Capillary: 138 mg/dL — ABNORMAL HIGH (ref 70–99)
Glucose-Capillary: 146 mg/dL — ABNORMAL HIGH (ref 70–99)

## 2018-05-03 MED ORDER — SODIUM CHLORIDE 0.9% IV SOLUTION: Freq: Once | INTRAVENOUS | Status: DC

## 2018-05-03 MED ORDER — BISACODYL 5 MG PO TBEC
5.0000 mg | DELAYED_RELEASE_TABLET | Freq: Every day | ORAL | Status: DC | PRN
Start: 2018-05-03 — End: 2018-05-06
  Administered 2018-05-04 – 2018-05-05 (×2): 5 mg via ORAL
  Filled 2018-05-03 (×2): qty 1

## 2018-05-03 MED ORDER — FUROSEMIDE 10 MG/ML IJ SOLN
20.0000 mg | Freq: Once | INTRAMUSCULAR | Status: AC
  Administered 2018-05-03: 20 mg via INTRAVENOUS
  Filled 2018-05-03: qty 2

## 2018-05-03 MED ORDER — VANCOMYCIN HCL IN DEXTROSE 1-5 GM/200ML-% IV SOLN
1000.0000 mg | Freq: Once | INTRAVENOUS | Status: AC
  Administered 2018-05-03: 1000 mg via INTRAVENOUS
  Filled 2018-05-03: qty 200

## 2018-05-03 NOTE — Social Work (Addendum)
Pt and pt husband given packet of SNF offers, they will review with their family. CSW will come back by this afternoon to review preferred offers.   1:20pm- CSW met with pt and pt husband at bedside, pt husband states he has not selected any offers, appears overwhelmed by making decision alone. Would like his daughters input today when she comes to visit.   Continuing to follow.   [place] Mt, [place] Work [phone]

## 2018-05-03 NOTE — Progress Notes (Addendum)
Occupational Therapy Treatment Patient Details Name: Katie Pace MRN: [id number] DOB: [birth year] Today's Date: 05/03/2018    History of present illness Pt is an 82 y/o female admitted 04/28/18 from SNF due to fever and decr arousal dx with cellulits.  Pt also found to have left base atelectasis vs pneumonia on CXR.  Pt with recent admission on 04/22/18 s/p fall resulting in Lt hip fracture and underwent surgery on 04/23/18 for anterior approach bipolar prosthesis WBAT post op.  PMH-dementia, HTN,  DM, CKD III, Rt TKR, Rt THR, AAA repair.   OT comments  Patient with limited progress.  Continues to require max A +2 for sit to stand transitions, given maximal verbal and tactile cueing for sequencing and techniques.  Limited standing tolerance, unable to complete stand pivot transfer to 3:1 due to tolerance, confusion, and incontinence of urine while standing. Requires re-orientation to place and situation throughout session.  Will continue to follow while admitted, but continue to recommend SNF at dc.    Follow Up Recommendations  SNF;Supervision/Assistance - 24 hour    Equipment Recommendations  3 in 1 bedside commode    Recommendations for Other Services      Precautions / Restrictions Precautions Precautions: Fall;Other (comment) Precaution Comments: incontinent of urine Restrictions Weight Bearing Restrictions: Yes LLE Weight Bearing: Weight bearing as tolerated       Mobility Bed Mobility               General bed mobility comments: seated OOB in recliner upon entry  Transfers Overall transfer level: Needs assistance Equipment used: Rolling walker (2 wheeled) Transfers: Sit to/from Stand Sit to Stand: +2 physical assistance;Max assist;Mod assist         General transfer comment: 2 person maxA for sit to stand for 2 trials, with max cueing for technique and sequencing with limited standing tolerance; limited session due to incontience of urine while standing      Balance Overall balance assessment: Needs assistance Sitting-balance support: Feet supported;Bilateral upper extremity supported Sitting balance-[name]: Fair     Standing balance support: Bilateral upper extremity supported Standing balance-[name]: Zero Standing balance comment: two person max assist .                            ADL either performed or assessed with clinical judgement   ADL Overall ADL's : Needs assistance/impaired             Lower Body Bathing: Total assistance                       Functional mobility during ADLs: +2 for physical assistance;Maximal assistance(sit to stand x 2 only ) General ADL Comments: pt completed sit <> stand x 2, limited by urinary incontience upon standing     Vision       Perception     Praxis      Cognition Arousal/Alertness: Awake/alert Behavior During Therapy: WFL for tasks assessed/performed Overall Cognitive Status: Impaired/Different from baseline                   Orientation Level: Disoriented to;Place;Time;Situation Current Attention Level: Sustained Memory: Decreased recall of precautions;Decreased short-term memory Following Commands: Follows one step commands inconsistently;Follows one step commands with increased time Safety/Judgement: Decreased awareness of safety;Decreased awareness of deficits Awareness: Intellectual Problem Solving: Slow processing;Decreased initiation;Difficulty sequencing;Requires verbal cues;Requires tactile cues          Exercises  Shoulder Instructions       General Comments husband present, but on phone    Pertinent Vitals/ Pain       Pain Assessment: Faces Faces Pain Scale: Hurts whole lot Pain Location: painful with movement Pain Descriptors / Indicators: Operative site guarding;Grimacing Pain Intervention(s): Limited activity within patient's tolerance;Repositioned  Home Living                                           Prior Functioning/Environment              Frequency  Min 2X/week        Progress Toward Goals  OT Goals(current goals can now be found in the care plan section)  Progress towards OT goals: Progressing toward goals  Acute Rehab OT Goals Patient Stated Goal: to get stronger OT Goal Formulation: With patient Time For Goal Achievement: 05/13/18 Potential to Achieve Goals: Good  Plan Discharge plan remains appropriate;Frequency remains appropriate    Co-evaluation                 AM-PAC PT "6 Clicks" Daily Activity     Outcome Measure   Help from another person eating meals?: A Little Help from another person taking care of personal grooming?: A Lot Help from another person toileting, which includes using toliet, bedpan, or urinal?: Total Help from another person bathing (including washing, rinsing, drying)?: Total Help from another person to put on and taking off regular upper body clothing?: A Lot Help from another person to put on and taking off regular lower body clothing?: Total 6 Click Score: 10    End of Session Equipment Utilized During Treatment: Gait belt;Oxygen  OT Visit Diagnosis: Unsteadiness on feet (R26.81) Pain - Right/Left: Left Pain - part of body: Hip   Activity Tolerance Patient tolerated treatment well   Patient Left in chair;with call bell/phone within reach;with family/visitor present   Nurse Communication Mobility status;Other (comment)(pt needs)        Time: 3734-2876 OT Time Calculation (min): 24 min  Charges: OT General Charges $OT Visit: 1 Visit OT Treatments $Self Care/Home Management : 23-37 mins  Delight Stare, OTR/L  Pager [place] 05/03/2018, 12:08 PM

## 2018-05-03 NOTE — Progress Notes (Signed)
PROGRESS NOTE    Katie Pace  WJX:[id number] DOB: 30-Dec-1935 DOA: 04/28/2018 PCP: Benita Stabile, MD   Brief Narrative:  HPI On 04/28/2018 by Dr. Ted Mcalpine Katie Pace is a 82 y.o. female with past medical history significant for a left hip hemiarthroplasty on 7/20 by Dr. 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  Patient is currently encephalopathic and unable to provide accurate history.  Husband at bedside states that when he went to visit her today at the skilled nursing facility he found her slumped over to the side with drooling around her mouth, was not as responsive as usual and felt warm to the touch.  When evaluated by staff at the SNF she was noted to have a temperature of 101 and was confused and hence was sent to the hospital for evaluation.  In the emergency department she had a confirmed temperature of 101, hemodynamically stable, in fact hypertensive, labs significant for creatinine of 1.9 which is close to her baseline, WBCs at 11.4, normal lactic acid of 0.80, she was  mildly tachypneic and tachycardic.  She was found to have significant edema and erythema surrounding her incision of her left hip and extending downwards to the knee.  EDP consulted with Dr. Roda Shutters, who performed her recent hip surgery who thought that patient could be treated with IV antibiotics at [facility], however I believe it is more reasonable for her to be transferred to [facility] for IV antibiotic therapy, ID consultation and to be formally assessed by her orthopedic surgeon given her recent surgery.  Interim history Admitted for possible left lower extremity cellulitis.  Orthopedics consulted and appreciated. Patient also with anemia- will give 1uPRBC.  Assessment & Plan   Left  lower extremity erythema and edema- cellulitis -Patient did not meet sepsis criteria on admission.  She did have fever however with no other significant vital sign changes and no leukocytosis. -Patient does have significant edema and erythema to the left thigh -Status post left hip arthroplasty on 04/23/2018 by Dr. Roda Shutters -Blood cultures show no growth to date -Was given vancomycin, aztreonam and Levaquin given penicillin allergy the emergency room -Currently on vancomycin and ceftriaxone -Orthopedics, Dr.Xu, consulted and appreciated.  Feels that this is not cellulitis and erythema is due to dependent edema as patient has been fairly sedentary for the past week -Continue pain control  -PT and OT consulted- recommended SNF -SW consulted -edema and erythema improving   Wheezing -Patient denies shortness of breath, and wheezing has improved -overnight, was given neb treatment and CXR obtained showing left base atelectasis or pneumonia -currently on antibiotics  -continue neb treatments PRN, incentive spirometry and flutter valve, and nasal saline -patient currently on solucortef  Diabetes mellitus, type II -Hemoglobin A1c 6 -Continue insulin sliding scale with CBG monitoring -not on any medications at home  Panhypopituitarism -Continue Synthroid and hydrocortisone -Patient had Sheehan syndrome back in the 1970s  Chronic kidney disease, stage IV -Baseline creatinine approximately 1.7-2.1, currently 2.18 -Continue to monitor BMP  Acute on Chronic normocytic anemia -Hemoglobin slightly lower, currently 7.7 (baseline around 9-10- recently had surgery) -Anemia panel shows adequate iron and iron stores, very elevated B12 at 2060 -FOBT pending  -discontinue lovenox -Continue to monitor CBC -patient also noted to have lower abdominal bruising, will order US  DVT Prophylaxis  lovenox discontinued, placed on on SCDs  Code Status: Full  Family Communication: Family at  bedside  Disposition Plan: Admitted. Suspect D/c to SNF in 48hrs (pending improvement of edema and erythema and anemia)  Consultants Orthopedic surgery  Procedures  None  Antibiotics   Anti-infectives (From admission, onward)   Start     Dose/Rate Route Frequency Ordered Stop   05/03/18 1130  vancomycin (VANCOCIN) IVPB 1000 mg/200 mL premix     1,000 mg 200 mL/hr over 60 Minutes Intravenous  Once 05/03/18 1124     05/02/18 1100  vancomycin (VANCOCIN) 500 mg in sodium chloride 0.9 % 100 mL IVPB  Status:  Discontinued     500 mg 100 mL/hr over 60 Minutes Intravenous Every 24 hours 05/01/18 1301 05/01/18 1307   05/01/18 1309  vancomycin variable dose per unstable renal function (pharmacist dosing)      Does not apply See admin instructions 05/01/18 1309     04/29/18 1100  vancomycin (VANCOCIN) IVPB 750 mg/150 ml premix  Status:  Discontinued     750 mg 150 mL/hr over 60 Minutes Intravenous Every 24 hours 04/28/18 1600 05/01/18 1301   04/28/18 1830  cefTRIAXone (ROCEPHIN) 1 g in sodium chloride 0.9 % 100 mL IVPB     1 g 200 mL/hr over 30 Minutes Intravenous Every 24 hours 04/28/18 1818     04/28/18 1800  cefTRIAXone (ROCEPHIN) 2 g in sodium chloride 0.9 % 100 mL IVPB  Status:  Discontinued     2 g 200 mL/hr over 30 Minutes Intravenous Every 24 hours 04/28/18 1600 04/28/18 1841   04/28/18 1000  vancomycin (VANCOCIN) 1,500 mg in sodium chloride 0.9 % 500 mL IVPB     1,500 mg 250 mL/hr over 120 Minutes Intravenous  Once 04/28/18 0943 04/28/18 1304   04/28/18 0930  aztreonam (AZACTAM) 2 g in sodium chloride 0.9 % 100 mL IVPB     2 g 200 mL/hr over 30 Minutes Intravenous  Once 04/28/18 0927 04/28/18 1050   04/28/18 0930  metroNIDAZOLE (FLAGYL) IVPB 500 mg     500 mg 100 mL/hr over 60 Minutes Intravenous  Once 04/28/18 0927 04/28/18 1122   04/28/18 0930  vancomycin (VANCOCIN) IVPB 1000 mg/200 mL premix  Status:  Discontinued     1,000 mg 200 mL/hr over 60 Minutes Intravenous  Once  04/28/18 7829 04/28/18 0943      Subjective:   Katie Pace seen and examined today.  Patient has dementia.  Has no complaints today.  Feels her breathing is better.  Husband at bedside states she has done better than yesterday.  Currently denies chest pain, shortness of breath, abdominal pain, nausea or vomiting, diarrhea or constipation.  Objective:   Vitals:   05/02/18 2042 05/02/18 2047 05/03/18 0458 05/03/18 0804  BP: (!) 148/58  (!) 145/66   Pulse: (!) 55  (!) 53   Resp: 16  16   Temp: (!) 97.5 F (36.4 C)  98 F (36.7 C)   TempSrc: Oral  Oral   SpO2: 96% 99% 95% 98%  Weight:      Height:        Intake/Output Summary (Last 24 hours) at 05/03/2018 1216 Last data filed at 05/03/2018 5621 Gross per 24 hour  Intake 250 ml  Output 1000 ml  Net -750 ml   Filed Weights   04/28/18 0927  Weight: 70.3 kg (155 lb)   Exam  General: Well developed, well nourished, NAD, appears stated age  HEENT: NCAT, mucous  membranes moist.   Neck: Supple  Cardiovascular: S1 S2 auscultated, RRR, no murmur  Respiratory: Diminished breath sounds with few scattered wheezes, positive cough  Abdomen: Soft, nontender, nondistended, + bowel sounds, lower abdominal wall ecchymosis  Extremities: warm dry without cyanosis clubbing.  Left lower extremity edema extending to the thigh, erythema has improved  Neuro: AAOx2, nonfocal  Psych: Pleasant, appropriate mood and affect  Data Reviewed: I have personally reviewed following labs and imaging studies  CBC: Recent Labs  Lab 04/28/18 0937  04/29/18 0510 04/30/18 0531 05/01/18 0302 05/01/18 1038 05/02/18 0322 05/03/18 0441  WBC 11.4*   < > 12.0* 12.1* 11.9*  --  12.6* 12.3*  NEUTROABS 8.4*  --   --   --   --   --   --   --   HGB 9.4*   < > 8.3* 8.6* 7.9* 8.0* 7.7* 7.7*  HCT 29.9*   < > 26.7* 28.3* 25.9* 26.5* 24.8* 24.8*  MCV 97.7   < > 99.6 100.4* 100.4*  --  99.2 98.4  PLT 221   < > 189 224 252  --  251 245   < > = values in  this interval not displayed.   Basic Metabolic Panel: Recent Labs  Lab 04/29/18 0510 04/30/18 0531 05/01/18 0302 05/02/18 0322 05/03/18 0441  NA 136 133* 131* 127* 129*  K 4.4 4.4 4.1 4.1 4.4  CL 107 105 104 99 102  CO2 19* 15* 19* 18* 19*  GLUCOSE 123* 137* 163* 140* 111*  BUN 34* 42* 45* 50* 54*  CREATININE 1.84* 1.94* 2.17* 2.07* 2.18*  CALCIUM 7.4* 7.8* 7.8* 7.7* 7.8*   GFR: Estimated Creatinine Clearance: 19.8 mL/min (A) (by C-G formula based on SCr of 2.18 mg/dL (H)). Liver Function Tests: Recent Labs  Lab 04/28/18 0937  AST 24  ALT 5  ALKPHOS 56  BILITOT 0.7  PROT 5.8*  ALBUMIN 2.3*   No results for input(s): LIPASE, AMYLASE in the last 168 hours. No results for input(s): AMMONIA in the last 168 hours. Coagulation Profile: No results for input(s): INR, PROTIME in the last 168 hours. Cardiac Enzymes: No results for input(s): CKTOTAL, CKMB, CKMBINDEX, TROPONINI in the last 168 hours. BNP (last 3 results) No results for input(s): PROBNP in the last 8760 hours. HbA1C: No results for input(s): HGBA1C in the last 72 hours. CBG: Recent Labs  Lab 05/02/18 1157 05/02/18 1718 05/02/18 2041 05/03/18 0813 05/03/18 1204  GLUCAP 134* 142* 131* 108* 126*   Lipid Profile: No results for input(s): CHOL, HDL, LDLCALC, TRIG, CHOLHDL, LDLDIRECT in the last 72 hours. Thyroid Function Tests: No results for input(s): TSH, T4TOTAL, FREET4, T3FREE, THYROIDAB in the last 72 hours. Anemia Panel: Recent Labs    05/02/18 1021  VITAMINB12 2,060*  FOLATE 28.0  FERRITIN 232  TIBC 225*  IRON 49  RETICCTPCT 2.7   Urine analysis:    Component Value Date/Time   COLORURINE YELLOW 04/28/2018 0925   APPEARANCEUR HAZY (A) 04/28/2018 0925   LABSPEC 1.018 04/28/2018 0925   PHURINE 5.0 04/28/2018 0925   GLUCOSEU NEGATIVE 04/28/2018 0925   HGBUR NEGATIVE 04/28/2018 0925   BILIRUBINUR NEGATIVE 04/28/2018 0925   KETONESUR 5 (A) 04/28/2018 0925   PROTEINUR 100 (A) 04/28/2018  0925   UROBILINOGEN 0.2 04/01/2012 0510   NITRITE NEGATIVE 04/28/2018 0925   LEUKOCYTESUR NEGATIVE 04/28/2018 0925   Sepsis Labs: @LABRCNTIP (procalcitonin:4,lacticidven:4)  ) Recent Results (from the past 240 hour(s))  Culture, Urine     Status: None  Collection Time: 04/24/18 11:28 AM  Result Value Ref Range Status   Specimen Description URINE, RANDOM  Final   Special Requests NONE  Final   Culture   Final    NO GROWTH Performed at [facility], [address] [place], [place] [address]    Report Status 04/25/2018 FINAL  Final  Blood Culture (routine x 2)     Status: None   Collection Time: 04/28/18  9:37 AM  Result Value Ref Range Status   Specimen Description BLOOD LEFT ARM  Final   Special Requests   Final    BOTTLES DRAWN AEROBIC AND ANAEROBIC Blood Culture adequate volume   Culture   Final    NO GROWTH 5 DAYS Performed at [facility], [address] [place], [place] [address]    Report Status 05/03/2018 FINAL  Final  Blood Culture (routine x 2)     Status: None   Collection Time: 04/28/18 10:08 AM  Result Value Ref Range Status   Specimen Description BLOOD LEFT HAND  Final   Special Requests   Final    BOTTLES DRAWN AEROBIC AND ANAEROBIC Blood Culture adequate volume   Culture   Final    NO GROWTH 5 DAYS Performed at [facility], [address], [place], [place] [address]    Report Status 05/03/2018 FINAL  Final      Radiology Studies: No results found.   Scheduled Meds: . sodium chloride   Intravenous Once  . atenolol  100 mg Oral QHS  . diltiazem  240 mg Oral QPM  . dipyridamole-aspirin  1 capsule Oral BID  . hydrALAZINE  50 mg Oral Q8H  . hydrocortisone sod succinate (SOLU-CORTEF) inj  100 mg Intravenous Q12H  . insulin aspart  0-15 Units Subcutaneous TID WC  . insulin aspart  0-5 Units Subcutaneous QHS  . levothyroxine  75 mcg Oral QAC breakfast  . pantoprazole  40 mg Oral Daily  . rosuvastatin  5 mg Oral QHS  . sodium chloride flush   10-40 mL Intracatheter Q12H  . vancomycin variable dose per unstable renal function (pharmacist dosing)   Does not apply See admin instructions   Continuous Infusions: . cefTRIAXone (ROCEPHIN)  IV 1 g (05/02/18 1734)  . vancomycin       LOS: 5 days   Time Spent in minutes   45 minutes  Jaykub Mackins D.O. on 05/03/2018 at 12:16 PM  Between 7am to 7pm - Pager - [phone]  After 7pm go to [email or web address] - password [email or web address]  And look for the night coverage person covering for me after hours  [facility] Office  [phone]

## 2018-05-03 NOTE — Progress Notes (Signed)
Pharmacy Antibiotic Note  Kaedence SHAKEENA KAFER is a 82 y.o. female admitted on 04/28/2018 with questionable cellulitis  Random vanc lvl 18  Plan: vanc 1 g x1 Continue ceftriaxone Consider abx de-escalation if not cellulitis  Height: 5\' 4"  (162.6 cm) Weight: 155 lb (70.3 kg) IBW/kg (Calculated) : 54.7  Temp (24hrs), Avg:97.7 F (36.5 C), Min:97.5 F (36.4 C), Max:98 F (36.7 C)  Recent Labs  Lab 04/28/18 0950 04/28/18 1228  04/29/18 0510 04/30/18 0531 05/01/18 0302 05/02/18 0322 05/02/18 1021 05/03/18 0441 05/03/18 1020  WBC  --   --    < > 12.0* 12.1* 11.9* 12.6*  --  12.3*  --   CREATININE  --   --    < > 1.84* 1.94* 2.17* 2.07*  --  2.18*  --   LATICACIDVEN 0.83 0.80  --   --   --   --   --   --   --   --   VANCOTROUGH  --   --   --   --   --   --   --  28*  --   --   VANCORANDOM  --   --   --   --   --   --   --   --   --  18   < > = values in this interval not displayed.    Estimated Creatinine Clearance: 19.8 mL/min (A) (by C-G formula based on SCr of 2.18 mg/dL (H)).    Allergies  Allergen Reactions  . Codeine Nausea Only  . Dilaudid [Hydromorphone Hcl] Nausea Only  . Lincomycin Hcl Other (See Comments)    Big bumps bilateral legs  . Macrodantin Hives  . Zolpidem Tartrate Other (See Comments)    Stroke  . Azithromycin Rash  . Penicillins Rash    Has patient had a PCN reaction causing immediate rash, facial/tongue/throat swelling, SOB or lightheadedness with hypotension: Yes Has patient had a PCN reaction causing severe rash involving mucus membranes or skin necrosis: No Has patient had a PCN reaction that required hospitalization: No Has patient had a PCN reaction occurring within the last 10 years: No If all of the above answers are "NO", then may proceed with Cephalosporin use.   Levester Fresh, PharmD, BCPS, BCCCP Clinical Pharmacist [phone]  Please check AMION for all [employer] numbers  05/03/2018 11:24 AM

## 2018-05-03 NOTE — Plan of Care (Signed)
  Problem: Elimination: Goal: Will not experience complications related to urinary retention Outcome: Progressing   Problem: Pain Managment: Goal: General experience of comfort will improve Outcome: Progressing   Problem: Safety: Goal: Ability to remain free from injury will improve Outcome: Progressing   

## 2018-05-04 DIAGNOSIS — L03116 Cellulitis of left lower limb: Secondary | ICD-10-CM

## 2018-05-04 LAB — CBC
HEMATOCRIT: 29.8 % — AB (ref 36.0–46.0)
Hemoglobin: 9.4 g/dL — ABNORMAL LOW (ref 12.0–15.0)
MCH: 30.1 pg (ref 26.0–34.0)
MCHC: 31.5 g/dL (ref 30.0–36.0)
MCV: 95.5 fL (ref 78.0–100.0)
PLATELETS: 263 10*3/uL (ref 150–400)
RBC: 3.12 MIL/uL — AB (ref 3.87–5.11)
RDW: 16.3 % — AB (ref 11.5–15.5)
WBC: 14.4 10*3/uL — ABNORMAL HIGH (ref 4.0–10.5)

## 2018-05-04 LAB — TYPE AND SCREEN
ABO/RH(D): A POS
Antibody Screen: NEGATIVE
Unit division: 0

## 2018-05-04 LAB — GLUCOSE, CAPILLARY
GLUCOSE-CAPILLARY: 142 mg/dL — AB (ref 70–99)
GLUCOSE-CAPILLARY: 128 mg/dL — AB (ref 70–99)
GLUCOSE-CAPILLARY: 103 mg/dL — AB (ref 70–99)
Glucose-Capillary: 132 mg/dL — ABNORMAL HIGH (ref 70–99)

## 2018-05-04 LAB — BPAM RBC
BLOOD PRODUCT EXPIRATION DATE: 201908202359
ISSUE DATE / TIME: 201907301450
UNIT TYPE AND RH: 6200

## 2018-05-04 LAB — BASIC METABOLIC PANEL
ANION GAP: 8 (ref 5–15)
BUN: 58 mg/dL — ABNORMAL HIGH (ref 8–23)
CALCIUM: 7.7 mg/dL — AB (ref 8.9–10.3)
CO2: 20 mmol/L — AB (ref 22–32)
Chloride: 100 mmol/L (ref 98–111)
Creatinine, Ser: 2.31 mg/dL — ABNORMAL HIGH (ref 0.44–1.00)
GFR, EST AFRICAN AMERICAN: 22 mL/min — AB (ref >60)
GFR, EST NON AFRICAN AMERICAN: 19 mL/min — AB (ref >60)
Glucose, Bld: 143 mg/dL — ABNORMAL HIGH (ref 70–99)
POTASSIUM: 4.1 mmol/L (ref 3.5–5.1)
Sodium: 128 mmol/L — ABNORMAL LOW (ref 135–145)

## 2018-05-04 LAB — HEMOGLOBIN AND HEMATOCRIT, BLOOD
HCT: 29.7 % — ABNORMAL LOW (ref 36.0–46.0)
Hemoglobin: 9.6 g/dL — ABNORMAL LOW (ref 12.0–15.0)

## 2018-05-04 MED ORDER — ACETAMINOPHEN 325 MG PO TABS
650.0000 mg | ORAL_TABLET | Freq: Four times a day (QID) | ORAL | Status: DC
  Administered 2018-05-04 – 2018-05-10 (×19): 650 mg via ORAL
  Filled 2018-05-04 (×21): qty 2

## 2018-05-04 MED ORDER — HYDROCORTISONE 5 MG PO TABS: 5.0000 mg | ORAL_TABLET | Freq: Every day | ORAL | Status: DC

## 2018-05-04 MED ORDER — HYDROCORTISONE 5 MG PO TABS
5.0000 mg | ORAL_TABLET | Freq: Every day | ORAL | Status: DC
  Administered 2018-05-04 – 2018-05-08 (×5): 5 mg via ORAL
  Filled 2018-05-04 (×7): qty 1

## 2018-05-04 MED ORDER — LACTATED RINGERS IV SOLN
INTRAVENOUS | Status: DC
  Administered 2018-05-04 – 2018-05-05 (×3): via INTRAVENOUS

## 2018-05-04 MED ORDER — HYDROCORTISONE 5 MG PO TABS
15.0000 mg | ORAL_TABLET | Freq: Every day | ORAL | Status: DC
  Administered 2018-05-05 – 2018-05-10 (×6): 15 mg via ORAL
  Filled 2018-05-04 (×7): qty 1

## 2018-05-04 MED ORDER — SODIUM CHLORIDE 0.9 % IV SOLN: INTRAVENOUS | Status: DC

## 2018-05-04 NOTE — Progress Notes (Signed)
Physical Therapy Treatment Patient Details Name: Katie Pace MRN: [id number] DOB: 1936/05/25 Today's Date: 05/04/2018    History of Present Illness Pt is an 82 y/o female admitted 04/28/18 from SNF due to fever and decr arousal dx with cellulits.  Pt also found to have left base atelectasis vs pneumonia on CXR.  Pt with recent admission on 04/22/18 s/p fall resulting in Lt hip fracture and underwent surgery on 04/23/18 for anterior approach bipolar prosthesis WBAT post op.  PMH-dementia, HTN,  DM, CKD III, Rt TKR, Rt THR, AAA repair.    PT Comments    Patient with increased confusion and tangential throughout session. Session limited by impaired cognition and pain. Family present. Continue to progress as tolerated with anticipated d/c to SNF for further skilled PT services.     Follow Up Recommendations  SNF;Supervision/Assistance - 24 hour     Equipment Recommendations  None recommended by PT    Recommendations for Other Services       Precautions / Restrictions Precautions Precautions: Fall;Other (comment) Precaution Comments: incontinent of urine Restrictions Weight Bearing Restrictions: Yes LLE Weight Bearing: Weight bearing as tolerated    Mobility  Bed Mobility               General bed mobility comments: pt OOB in chair upon arrival  Transfers Overall transfer level: Needs assistance Equipment used: (+2 with gait belt and bed pad) Transfers: Sit to/from Stand Sit to Stand: +2 physical assistance;Max assist         General transfer comment: cues for sequencing and +2 assistance for attempt of STS from recliner; pt unable to achieve standing and began pinching therapist in the arm mid transfer; pt returned to sitting and repositioned in recliner  Ambulation/Gait                 Stairs             Wheelchair Mobility    Modified Rankin (Stroke Patients Only)       Balance Overall balance assessment: Needs assistance Sitting-balance  support: Feet supported;Bilateral upper extremity supported Sitting balance-[name]: Poor                                      Cognition Arousal/Alertness: Awake/alert Behavior During Therapy: Agitated Overall Cognitive Status: Impaired/Different from baseline Area of Impairment: Orientation;Attention;Memory;Following commands;Safety/judgement;Awareness;Problem solving                 Orientation Level: Disoriented to;Time;Situation;Place Current Attention Level: Sustained Memory: Decreased recall of precautions;Decreased short-term memory Following Commands: Follows one step commands inconsistently Safety/Judgement: Decreased awareness of safety;Decreased awareness of deficits Awareness: Intellectual Problem Solving: Slow processing;Decreased initiation;Difficulty sequencing;Requires verbal cues;Requires tactile cues General Comments: pt tangential throughout session       Exercises      General Comments General comments (skin integrity, edema, etc.): family present during session and report pt has increased confusion today and would not take medications earlier in the day      Pertinent Vitals/Pain Pain Assessment: 0-10 Faces Pain Scale: Hurts even more Pain Location: painful with all movement Pain Descriptors / Indicators: (pt yells with any movement) Pain Intervention(s): Limited activity within patient's tolerance;Monitored during session;Repositioned    Home Living                      Prior Function  PT Goals (current goals can now be found in the care plan section) Progress towards PT goals: Not progressing toward goals - comment(increased cognitive impairment today)    Frequency    Min 3X/week      PT Plan Current plan remains appropriate    Co-evaluation              AM-PAC PT "6 Clicks" Daily Activity  Outcome Measure  Difficulty turning over in bed (including adjusting bedclothes, sheets and  blankets)?: Unable Difficulty moving from lying on back to sitting on the side of the bed? : Unable Difficulty sitting down on and standing up from a chair with arms (e.g., wheelchair, bedside commode, etc,.)?: Unable Help needed moving to and from a bed to chair (including a wheelchair)?: Total Help needed walking in hospital room?: Total Help needed climbing 3-5 steps with a railing? : Total 6 Click Score: 6    End of Session Equipment Utilized During Treatment: Gait belt Activity Tolerance: Patient limited by pain(limited by cognition) Patient left: with call bell/phone within reach;with family/visitor present;in chair Nurse Communication: Mobility status PT Visit Diagnosis: Other abnormalities of gait and mobility (R26.89);Pain Pain - Right/Left: Left Pain - part of body: Hip     Time: 1213-1230 PT Time Calculation (min) (ACUTE ONLY): 17 min  Charges:  $Therapeutic Activity: 8-22 mins                     Earney Navy, PTA Pager: [phone]     Darliss Cheney 05/04/2018, 2:06 PM

## 2018-05-04 NOTE — Social Work (Addendum)
CSW met with pt daughter [name], pt sleeping. She requested I follow up with [facility] and [facility] as they are closest for the family to stop by and see family.   CSW followed up with admissions liaisons.  12:46pm- Both facilities able to offer bed, pt daughter will let CSW know which pt family would like to accept. Tours scheduled at both facilities for daughter.   Continuing to follow.   Alexander Mt, Walker Work [phone]

## 2018-05-04 NOTE — Progress Notes (Signed)
PROGRESS NOTE    Katie Pace  FIE:[id number] DOB: 1935-11-15 DOA: 04/28/2018 PCP: [name], MD   Brief Narrative:   82 y.o. f  left hip hemiarthroplasty on 7/20 by Dr. 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   Husband at bedside states that when he went to visit her today at the skilled nursing facility he found her slumped - evaluated by staff at the SNF she was noted to have a temperature of 101 and was confused and hence was sent to the hospital for evaluation.   ED WORK-UP= TMAX 101, hemodynamically stable creatinine of 1.9 which is close to her baseline,  WBCs at 11.4, normal lactic acid of 0.80, she was  mildly tachypneic and tachycardic.    She was found to have significant edema and erythema surrounding her incision of her left hip and extending downwards to the knee.    EDP consulted with Dr. Erlinda Hong, who performed her recent hip surgery who thought that patient could be treated with IV antibiotics at [facility], however I believe it is more reasonable for her to be transferred to [facility] for IV antibiotic therapy, ID consultation and to be formally assessed by her orthopedic surgeon given her recent surgery.    Assessment & Plan   Left lower extremity erythema and edema- cellulitis -Patient did not meet sepsis criteria on admission.  She did have fever however with no other significant vital sign changes and no leukocytosis. -Patient does have significant edema and erythema to the left thigh -Status post left hip arthroplasty on 04/23/2018 by Dr. Erlinda Hong -Blood cultures show no growth to date -Was given vancomycin, aztreonam and Levaquin given penicillin allergY AND EVENTUALLY ALL AX D/C 7/31 -Orthopedics, Dr.Xu, consulted and appreciated.   Feels that this is not cellulitis and erythema is due to dependent edema as patient has been fairly sedentary for the past week -PT and OT consulted- recommended SNF -SW consulted -edema and erythema improving  -narrowed off of Abx on 7/31 and rpt CBc in am and monitor fever curve  MEt encephalopathy ? From AKI vs steroid pyscosis  rise in creat to 2.3-holding opiates-scheduled tylenol-labs am with ammonia to evalaute further-has NASH habitus Change to home Cortef 15 am, 5 pm   Wheezing -Patient denies shortness of breath, and wheezing has improved -cxr 7/30 unclear for PNA--receive dso far 5 days so stop -continue neb treatments PRN, incentive spirometry and flutter valve, and nasal saline -patient currently on solucortef  Diabetes mellitus, type II -Hemoglobin A1c 6 -Continue insulin sliding scale with CBG 130-140 -not on any medications at home  Panhypopituitarism -Continue Synthroid and hydrocortisone -Patient had Sheehan syndrome back in the 1970s  Chronic kidney disease, stage IV Hypervolemic hyponatremia with contraction alkalosis Edema 2/2 malunutrition and low albumin -Baseline creatinine approximately 1.7-2.1, currently 2.31 -adding today LR 100cc/h--to give tonicity and decrease alkalosis  ACute on Chronic normocytic anemia -Hemoglobin slightly lower, currently 7.7 -1 U PRBC 7/30 transfused -Anemia panel shows adequate iron and iron stores, very elevated B12 at 2060 -FOBT pending still -SCD -Abd US shows no hematoma from 7/30  DVT Prophylaxis  lovenox discontinued, placed on on SCDs  Code Status: Full  Family Communication: Family at bedside, d/w husband in person  Disposition Plan: Admitted. Unclear-mentation needs to clear Consultants  Orthopedic surgery  Procedures  None  Antibiotics     Subjective:   Confused-RN states wasn't this confuse dbefore-aggressive vs husband Eating some but doesn tliek food Cannot orient  Objective:   Vitals:    05/03/18 1522 05/03/18 1755 05/03/18 2105 05/04/18 0621  BP:  (!) 190/78 (!) 173/82 (!) 146/64  Pulse:  (!) 55 (!) 57 (!) 50  Resp:  15  15  Temp:  97.6 F (36.4 C) (!) 97.5 F (36.4 C) 97.7 F (36.5 C)  TempSrc:  Oral Oral Oral  SpO2: 98% 96% 95% 97%  Weight:      Height:        Intake/Output Summary (Last 24 hours) at 05/04/2018 1547 Last data filed at 05/04/2018 1200 Gross per 24 hour  Intake 890 ml  Output 400 ml  Net 490 ml   Filed Weights   04/28/18 0927  Weight: 70.3 kg (155 lb)   Exam  Awake alert in nad but confused-no fever no chills  LE swllen and grade 3 edema pittting--woudn snot examined today  abd obese nt nd-hematoma is superifical in LL abd but seems to be clearing--sutures on L side intact  cta b-No added sound  Neuro intact  Data Reviewed: I have personally reviewed following labs and imaging studies  CBC: Recent Labs  Lab 04/28/18 0937  04/30/18 0531 05/01/18 0302 05/01/18 1038 05/02/18 0322 05/03/18 0441 05/03/18 2351 05/04/18 0354  WBC 11.4*   < > 12.1* 11.9*  --  12.6* 12.3*  --  14.4*  NEUTROABS 8.4*  --   --   --   --   --   --   --   --   HGB 9.4*   < > 8.6* 7.9* 8.0* 7.7* 7.7* 9.6* 9.4*  HCT 29.9*   < > 28.3* 25.9* 26.5* 24.8* 24.8* 29.7* 29.8*  MCV 97.7   < > 100.4* 100.4*  --  99.2 98.4  --  95.5  PLT 221   < > 224 252  --  251 245  --  263   < > = values in this interval not displayed.   Basic Metabolic Panel: Recent Labs  Lab 04/30/18 0531 05/01/18 0302 05/02/18 0322 05/03/18 0441 05/04/18 0354  NA 133* 131* 127* 129* 128*  K 4.4 4.1 4.1 4.4 4.1  CL 105 104 99 102 100  CO2 15* 19* 18* 19* 20*  GLUCOSE 137* 163* 140* 111* 143*  BUN 42* 45* 50* 54* 58*  CREATININE 1.94* 2.17* 2.07* 2.18* 2.31*  CALCIUM 7.8* 7.8* 7.7* 7.8* 7.7*   GFR: Estimated Creatinine Clearance: 18.7 mL/min (A) (by C-G formula based on SCr of 2.31 mg/dL (H)). Liver Function Tests: Recent Labs  Lab 04/28/18 0937  AST 24  ALT 5  ALKPHOS 56    BILITOT 0.7  PROT 5.8*  ALBUMIN 2.3*   No results for input(s): LIPASE, AMYLASE in the last 168 hours. No results for input(s): AMMONIA in the last 168 hours. Coagulation Profile: No results for input(s): INR, PROTIME in the last 168 hours. Cardiac Enzymes: No results for input(s): CKTOTAL, CKMB, CKMBINDEX, TROPONINI in the last 168 hours. BNP (last 3 results) No results for input(s): PROBNP in the last 8760 hours. HbA1C: No results for input(s): HGBA1C in the last 72 hours. CBG: Recent Labs  Lab 05/03/18 1204 05/03/18 1724 05/03/18 2101 05/04/18 0809 05/04/18 1229  GLUCAP 126* 138* 146* 132* 142*   Lipid Profile: No results for input(s): CHOL, HDL, LDLCALC, TRIG, CHOLHDL, LDLDIRECT in  the last 72 hours. Thyroid Function Tests: No results for input(s): TSH, T4TOTAL, FREET4, T3FREE, THYROIDAB in the last 72 hours. Anemia Panel: Recent Labs    05/02/18 1021  VITAMINB12 2,060*  FOLATE 28.0  FERRITIN 232  TIBC 225*  IRON 49  RETICCTPCT 2.7   Urine analysis:    Component Value Date/Time   COLORURINE YELLOW 04/28/2018 0925   APPEARANCEUR HAZY (A) 04/28/2018 0925   LABSPEC 1.018 04/28/2018 0925   PHURINE 5.0 04/28/2018 0925   GLUCOSEU NEGATIVE 04/28/2018 0925   HGBUR NEGATIVE 04/28/2018 0925   BILIRUBINUR NEGATIVE 04/28/2018 0925   KETONESUR 5 (A) 04/28/2018 0925   PROTEINUR 100 (A) 04/28/2018 0925   UROBILINOGEN 0.2 04/01/2012 0510   NITRITE NEGATIVE 04/28/2018 0925   LEUKOCYTESUR NEGATIVE 04/28/2018 0925   Sepsis Labs: _0 (procalcitonin:4,lacticidven:4)  ) Recent Results (from the past 240 hour(s))  Blood Culture (routine x 2)     Status: None   Collection Time: 04/28/18  9:37 AM  Result Value Ref Range Status   Specimen Description BLOOD LEFT ARM  Final   Special Requests   Final    BOTTLES DRAWN AEROBIC AND ANAEROBIC Blood Culture adequate volume   Culture   Final    NO GROWTH 5 DAYS Performed at [facility], [address], [place],  [place] [address]    Report Status 05/03/2018 FINAL  Final  Blood Culture (routine x 2)     Status: None   Collection Time: 04/28/18 10:08 AM  Result Value Ref Range Status   Specimen Description BLOOD LEFT HAND  Final   Special Requests   Final    BOTTLES DRAWN AEROBIC AND ANAEROBIC Blood Culture adequate volume   Culture   Final    NO GROWTH 5 DAYS Performed at [facility], [address], [place],  [address]    Report Status 05/03/2018 FINAL  Final      Radiology Studies: US Abdomen Limited  Result Date: 05/03/2018 CLINICAL DATA:  Right and left lower quadrant abdominal wall masses, redness and swelling, question hematoma. Left hip arthroplasty 10 days ago. EXAM: ULTRASOUND ABDOMEN LIMITED COMPARISON:  None. FINDINGS: Targeted sonographic evaluation in the area of the right lower quadrant abdominal wall in the area of redness demonstrates subcutaneous edema without dominant focal fluid collection or mass. Targeted sonographic evaluation in the area of the left lower quadrant abdominal wall in the area of redness demonstrates subcutaneous edema without focal fluid collection or mass. Incidental note of urinary bladder distention with bladder volume of 1048.0 cm^3. IMPRESSION: 1. Edema in the right and left lower quadrant anterior abdominal wall without focal fluid collection or mass. No confluent hematoma. 2. Distended urinary bladder with bladder volume of 1048.0 cm^3. Electronically Signed   By: Jeb Levering M.D.   On: 05/03/2018 22:19   Dg Chest Port 1 View  Result Date: 05/03/2018 CLINICAL DATA:  Shortness of breath. EXAM: PORTABLE CHEST 1 VIEW COMPARISON:  Radiograph of April 30, 2018. FINDINGS: Stable cardiomegaly. Atherosclerosis of thoracic aorta is noted. No pneumothorax is noted. Right-sided PICC line is unchanged in position. Mild bibasilar atelectasis is noted with minimal pleural effusions. Bony thorax is unremarkable. IMPRESSION: Mild bibasilar subsegmental atelectasis with  minimal pleural effusions. Aortic Atherosclerosis (ICD10-I70.0). Electronically Signed   By: Marijo Conception, M.D.   On: 05/03/2018 16:16     Scheduled Meds: . sodium chloride   Intravenous Once  . atenolol  100 mg Oral QHS  . diltiazem  240 mg Oral QPM  . dipyridamole-aspirin  1 capsule Oral BID  . hydrALAZINE  50 mg Oral Q8H  . hydrocortisone sod succinate (SOLU-CORTEF) inj  100 mg Intravenous Q12H  . insulin aspart  0-15 Units Subcutaneous TID WC  . insulin aspart  0-5 Units Subcutaneous QHS  . levothyroxine  75 mcg Oral QAC breakfast  . pantoprazole  40 mg Oral Daily  . rosuvastatin  5 mg Oral QHS  . sodium chloride flush  10-40 mL Intracatheter Q12H  . vancomycin variable dose per unstable renal function (pharmacist dosing)   Does not apply See admin instructions   Continuous Infusions: . cefTRIAXone (ROCEPHIN)  IV 1 g (05/03/18 1843)  . lactated ringers 100 mL/hr at 05/04/18 0902     LOS: 6 days   Time Spent in minutes   45 minutes  Verneita Griffes, MD [facility] [phone]

## 2018-05-04 NOTE — Progress Notes (Signed)
Orthopedic Tech Progress Note Patient Details:  Katie Pace May 15, 1936 [id number]  Ortho Devices Type of Ortho Device: Haematologist Ortho Device/Splint Location: bilateral Ortho Device/Splint Interventions: Application   Post Interventions Patient Tolerated: Well Instructions Provided: Care of device   Hildred Priest 05/04/2018, 5:54 PM

## 2018-05-05 ENCOUNTER — Inpatient Hospital Stay (HOSPITAL_COMMUNITY): Payer: Medicare Other

## 2018-05-05 LAB — COMPREHENSIVE METABOLIC PANEL
ALBUMIN: 2.3 g/dL — AB (ref 3.5–5.0)
ALT: 12 U/L (ref 0–44)
AST: 19 U/L (ref 15–41)
Alkaline Phosphatase: 58 U/L (ref 38–126)
Anion gap: 8 (ref 5–15)
BUN: 55 mg/dL — AB (ref 8–23)
CO2: 21 mmol/L — AB (ref 22–32)
Calcium: 7.8 mg/dL — ABNORMAL LOW (ref 8.9–10.3)
Chloride: 104 mmol/L (ref 98–111)
Creatinine, Ser: 2.23 mg/dL — ABNORMAL HIGH (ref 0.44–1.00)
GFR calc Af Amer: 23 mL/min — ABNORMAL LOW (ref >60)
GFR calc non Af Amer: 20 mL/min — ABNORMAL LOW (ref >60)
GLUCOSE: 96 mg/dL (ref 70–99)
POTASSIUM: 3.9 mmol/L (ref 3.5–5.1)
SODIUM: 133 mmol/L — AB (ref 135–145)
TOTAL PROTEIN: 4.7 g/dL — AB (ref 6.5–8.1)
Total Bilirubin: 0.7 mg/dL (ref 0.3–1.2)

## 2018-05-05 LAB — GLUCOSE, CAPILLARY
GLUCOSE-CAPILLARY: 123 mg/dL — AB (ref 70–99)
Glucose-Capillary: 88 mg/dL (ref 70–99)
Glucose-Capillary: 92 mg/dL (ref 70–99)
Glucose-Capillary: 142 mg/dL — ABNORMAL HIGH (ref 70–99)
Glucose-Capillary: 132 mg/dL — ABNORMAL HIGH (ref 70–99)

## 2018-05-05 LAB — CBC WITH DIFFERENTIAL/PLATELET
BASOS ABS: 0 10*3/uL (ref 0.0–0.1)
Basophils Relative: 0 %
EOS ABS: 0 10*3/uL (ref 0.0–0.7)
Eosinophils Relative: 0 %
HEMATOCRIT: 28.7 % — AB (ref 36.0–46.0)
HEMOGLOBIN: 9.2 g/dL — AB (ref 12.0–15.0)
LYMPHS ABS: 0.8 10*3/uL (ref 0.7–4.0)
LYMPHS PCT: 6 %
MCH: 30.3 pg (ref 26.0–34.0)
MCHC: 32.1 g/dL (ref 30.0–36.0)
MCV: 94.4 fL (ref 78.0–100.0)
MONOS PCT: 6 %
Monocytes Absolute: 0.8 10*3/uL (ref 0.1–1.0)
Neutro Abs: 11.6 10*3/uL — ABNORMAL HIGH (ref 1.7–7.7)
Neutrophils Relative %: 88 %
Platelets: 233 10*3/uL (ref 150–400)
RBC: 3.04 MIL/uL — AB (ref 3.87–5.11)
RDW: 16.6 % — AB (ref 11.5–15.5)
WBC: 13.2 10*3/uL — AB (ref 4.0–10.5)

## 2018-05-05 MED ORDER — SORBITOL 70 % SOLN
20.0000 mL | Freq: Every day | Status: DC | PRN
  Administered 2018-05-05: 20 mL via ORAL
  Filled 2018-05-05 (×3): qty 30

## 2018-05-05 MED ORDER — HALOPERIDOL LACTATE 5 MG/ML IJ SOLN
2.0000 mg | Freq: Once | INTRAMUSCULAR | Status: AC
  Administered 2018-05-05: 2 mg via INTRAVENOUS
  Filled 2018-05-05: qty 1

## 2018-05-05 NOTE — Progress Notes (Signed)
PROGRESS NOTE    Katie Pace  WNU:[id number] DOB: [birth year] DOA: 04/28/2018 PCP: [name], MD   Brief Narrative:   82 y.o. F  left hip hemiarthroplasty on 7/20 by Dr. 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   Husband at bedside states that when he went to visit her today at the skilled nursing facility he found her slumped - evaluated by staff at the SNF she was noted to have a temperature of 101 and was confused and hence was sent to the hospital for evaluation.   ED WORK-UP= TMAX 101, hemodynamically stable creatinine of 1.9 which is close to her baseline,  WBCs at 11.4, normal lactic acid of 0.80, she was  mildly tachypneic and tachycardic.    She was found to have significant edema and erythema surrounding her incision of her left hip and extending downwards to the knee.    EDP consulted with Dr. Erlinda Hong, who performed her recent hip surgery who thought that patient could be treated with IV antibiotics at [facility], however I believe it is more reasonable for her to be transferred to [facility] for IV antibiotic therapy, ID consultation and to be formally assessed by her orthopedic surgeon given her recent surgery.    Assessment & Plan   Left lower extremity erythema and edema- cellulitis -Patient did not meet sepsis criteria on admission.  She did have fever however with no other significant vital sign changes and no leukocytosis. -Patient does have significant edema and erythema to the left thigh -Status post left hip arthroplasty on 04/23/2018 by Dr. Erlinda Hong -Blood cultures show no growth to date -Was given vancomycin, aztreonam and Levaquin given penicillin allergY AND EVENTUALLY ALL AX D/C 7/31 -Orthopedics, Dr.Xu, consulted and appreciated.  Feels  is due to dependent edema as patient has been fairly sedentary for the past week-no infection -PT and OT consulted- recommended SNF -SW consulted -edema and erythema improving --UNNA boots on, WOC nurse appreicated -narrowed off of Abx on 7/31 and rpt CBc in am and monitor fever curve  MEt encephalopathy ? From AKI vs steroid pyscosis  rise in creat to 2.3-->2.23-holding opiates-scheduled tylenol-labs am with ammonia to evalaute further-has NASH habitus Change to home Cortef 15 am, 5 pm  Wheezing -Patient denies shortness of breath, and wheezing has improved -cxr 7/30 unclear for PNA--received so far 5 days so stop -continue neb treatments PRN, incentive spirometry and flutter valve, and nasal saline -solucortef home dosing -checking am CXR as leukocytosis still--no fevers  Diabetes mellitus, type II -Hemoglobin A1c 6 -Continue insulin sliding scale with CBG 90's -not on any medications at home  Panhypopituitarism -Continue Synthroid and hydrocortisone -Patient had Sheehan syndrome back in the 1970s  Chronic kidney disease, stage IV Hypervolemic hyponatremia with contraction alkalosis Edema 2/2 malunutrition and low albumin -Baseline creatinine approximately 1.7-2.1, currently 2.2 -taper off LR  ACute on Chronic normocytic anemia -Hemoglobin slightly lower, currently 7.7 -1 U PRBC 7/30 transfused -Anemia panel shows adequate iron and iron stores, very elevated B12 at 2060 -FOBT pending still -SCD -Abd US shows no hematoma from 7/30  DVT Prophylaxis  lovenox discontinued, placed on on SCDs  Code Status: Full  Family Communication: Family at bedside, d/w husband in person  Disposition Plan: Admitted. Possible d/c in am Consultants Orthopedic surgery  Procedures  None  Antibiotics     Subjective:   More awake alert  Doesn't recal events around yesterday Less aggressive No cp No fever  Objective:   Vitals:   05/04/18 0621 05/04/18 1557 05/04/18 2110  05/05/18 0507  BP: (!) 146/64 (!) 186/60 (!) 190/75 (!) 163/62  Pulse: (!) 50 (!) 52 (!) 55 (!) 47  Resp: _0 Temp: 97.7 F (36.5 C) (!) 97.3 F (36.3 C) 97.6 F (36.4 C) (!) 97.4 F (36.3 C)  TempSrc: Oral Oral Oral Oral  SpO2: 97% 98% 92% 94%  Weight:      Height:        Intake/Output Summary (Last 24 hours) at 05/05/2018 1651 Last data filed at 05/05/2018 0900 Gross per 24 hour  Intake 1380 ml  Output -  Net 1380 ml   Filed Weights   04/28/18 0927  Weight: 70.3 kg (155 lb)   Exam  Awake alert in nad -much less confused  LE swollen and grade 3 edema --Unna boots in place  abd obese nt nd  cta b-No added sound  Neuro intact  Data Reviewed: I have personally reviewed following labs and imaging studies  CBC: Recent Labs  Lab 05/01/18 0302  05/02/18 0322 05/03/18 0441 05/03/18 2351 05/04/18 0354 05/05/18 0415  WBC 11.9*  --  12.6* 12.3*  --  14.4* 13.2*  NEUTROABS  --   --   --   --   --   --  11.6*  HGB 7.9*   < > 7.7* 7.7* 9.6* 9.4* 9.2*  HCT 25.9*   < > 24.8* 24.8* 29.7* 29.8* 28.7*  MCV 100.4*  --  99.2 98.4  --  95.5 94.4  PLT 252  --  251 245  --  263 233   < > = values in this interval not displayed.   Basic Metabolic Panel: Recent Labs  Lab 05/01/18 0302 05/02/18 0322 05/03/18 0441 05/04/18 0354 05/05/18 0415  NA 131* 127* 129* 128* 133*  K 4.1 4.1 4.4 4.1 3.9  CL 104 99 102 100 104  CO2 19* 18* 19* 20* 21*  GLUCOSE 163* 140* 111* 143* 96  BUN 45* 50* 54* 58* 55*  CREATININE 2.17* 2.07* 2.18* 2.31* 2.23*  CALCIUM 7.8* 7.7* 7.8* 7.7* 7.8*   GFR: Estimated Creatinine Clearance: 19.3 mL/min (A) (by C-G formula based on SCr of 2.23 mg/dL (H)). Liver Function Tests: Recent Labs  Lab 05/05/18 0415  AST 19  ALT 12  ALKPHOS 58  BILITOT 0.7  PROT 4.7*  ALBUMIN 2.3*   No results for input(s): LIPASE, AMYLASE in the last 168 hours. No results for input(s): AMMONIA in the last 168 hours. Coagulation Profile: No results for  input(s): INR, PROTIME in the last 168 hours. Cardiac Enzymes: No results for input(s): CKTOTAL, CKMB, CKMBINDEX, TROPONINI in the last 168 hours. BNP (last 3 results) No results for input(s): PROBNP in the last 8760 hours. HbA1C: No results for input(s): HGBA1C in the last 72 hours. CBG: Recent Labs  Lab 05/04/18 1229 05/04/18 1820 05/04/18 2110 05/05/18 0812 05/05/18 1250  GLUCAP 142* 128* 103* 88 92   Lipid Profile: No results for input(s): CHOL, HDL, LDLCALC, TRIG, CHOLHDL, LDLDIRECT in the last 72 hours. Thyroid Function Tests: No results for input(s): TSH, T4TOTAL, FREET4, T3FREE, THYROIDAB in the last 72 hours. Anemia Panel: No results for input(s): VITAMINB12, FOLATE, FERRITIN, TIBC, IRON, RETICCTPCT in the last 72 hours. Urine analysis:    Component Value Date/Time  COLORURINE YELLOW 04/28/2018 0925   APPEARANCEUR HAZY (A) 04/28/2018 0925   LABSPEC 1.018 04/28/2018 0925   PHURINE 5.0 04/28/2018 0925   GLUCOSEU NEGATIVE 04/28/2018 0925   HGBUR NEGATIVE 04/28/2018 0925   BILIRUBINUR NEGATIVE 04/28/2018 0925   KETONESUR 5 (A) 04/28/2018 0925   PROTEINUR 100 (A) 04/28/2018 0925   UROBILINOGEN 0.2 04/01/2012 0510   NITRITE NEGATIVE 04/28/2018 0925   LEUKOCYTESUR NEGATIVE 04/28/2018 0925   Sepsis Labs: _0 (procalcitonin:4,lacticidven:4)  ) Recent Results (from the past 240 hour(s))  Blood Culture (routine x 2)     Status: None   Collection Time: 04/28/18  9:37 AM  Result Value Ref Range Status   Specimen Description BLOOD LEFT ARM  Final   Special Requests   Final    BOTTLES DRAWN AEROBIC AND ANAEROBIC Blood Culture adequate volume   Culture   Final    NO GROWTH 5 DAYS Performed at [facility], [address] [place], [place] [address]    Report Status 05/03/2018 FINAL  Final  Blood Culture (routine x 2)     Status: None   Collection Time: 04/28/18 10:08 AM  Result Value Ref Range Status   Specimen Description BLOOD LEFT HAND  Final   Special  Requests   Final    BOTTLES DRAWN AEROBIC AND ANAEROBIC Blood Culture adequate volume   Culture   Final    NO GROWTH 5 DAYS Performed at [facility], [address] [place],  [address]    Report Status 05/03/2018 FINAL  Final      Radiology Studies: US Abdomen Limited  Result Date: 05/03/2018 CLINICAL DATA:  Right and left lower quadrant abdominal wall masses, redness and swelling, question hematoma. Left hip arthroplasty 10 days ago. EXAM: ULTRASOUND ABDOMEN LIMITED COMPARISON:  None. FINDINGS: Targeted sonographic evaluation in the area of the right lower quadrant abdominal wall in the area of redness demonstrates subcutaneous edema without dominant focal fluid collection or mass. Targeted sonographic evaluation in the area of the left lower quadrant abdominal wall in the area of redness demonstrates subcutaneous edema without focal fluid collection or mass. Incidental note of urinary bladder distention with bladder volume of 1048.0 cm^3. IMPRESSION: 1. Edema in the right and left lower quadrant anterior abdominal wall without focal fluid collection or mass. No confluent hematoma. 2. Distended urinary bladder with bladder volume of 1048.0 cm^3. Electronically Signed   By: Jeb Levering M.D.   On: 05/03/2018 22:19     Scheduled Meds: . sodium chloride   Intravenous Once  . acetaminophen  650 mg Oral Q6H  . atenolol  100 mg Oral QHS  . diltiazem  240 mg Oral QPM  . dipyridamole-aspirin  1 capsule Oral BID  . hydrALAZINE  50 mg Oral Q8H  . hydrocortisone  15 mg Oral Daily   And  . hydrocortisone  5 mg Oral QHS  . insulin aspart  0-15 Units Subcutaneous TID WC  . insulin aspart  0-5 Units Subcutaneous QHS  . levothyroxine  75 mcg Oral QAC breakfast  . pantoprazole  40 mg Oral Daily  . sodium chloride flush  10-40 mL Intracatheter Q12H   Continuous Infusions: . lactated ringers 100 mL/hr at 05/05/18 1312     LOS: 7 days   Time Spent in minutes   45 minutes  Verneita Griffes, MD [facility] (P) [phone]

## 2018-05-05 NOTE — Consult Note (Signed)
[place] Nurse wound consult note Reason for Consult:bilateral partial thickness wounds on LEs. Unna's boots placed last pm. By orthopedic technician.  Family and bedside nursing staff report several small shallow, partial thickness areas of tissue loss. No "wounds". Wound type:venous insufficiency Pressure Injury POA: NA Measurement:N/A Wound bed:Not seen today Drainage (amount, consistency, odor) Legs were reported as dry yesterday Periwound:intact Dressing procedure/placement/frequency:Bilateral Unna's Boots to be changed once weekly while in house.  Patient to discharge to SNF according to notes.  Ortho tech to change boots weekly. [place] nursing team will not follow, but will remain available to this patient, the nursing and medical teams.  Please re-consult if needed. Thanks, Maudie Flakes, MSN, RN, [place], Arther Abbott  Pager# [phone]

## 2018-05-05 NOTE — Progress Notes (Signed)
Patient stated "she did not feel good" . Upon assessment patient seemed to have some labored breathing but oxygen staturations were within normal limits. MD notified and chest xray will be obtained. Blood pressure was elevated, scheduled medication was administered. Will continue to monitor.

## 2018-05-06 ENCOUNTER — Inpatient Hospital Stay (HOSPITAL_COMMUNITY): Payer: Medicare Other

## 2018-05-06 DIAGNOSIS — L8990 Pressure ulcer of unspecified site, unspecified stage: Secondary | ICD-10-CM

## 2018-05-06 LAB — CBC WITH DIFFERENTIAL/PLATELET
BASOS PCT: 0 %
Basophils Absolute: 0 10*3/uL (ref 0.0–0.1)
Eosinophils Absolute: 0 10*3/uL (ref 0.0–0.7)
Eosinophils Relative: 0 %
HEMATOCRIT: 32.3 % — AB (ref 36.0–46.0)
HEMOGLOBIN: 9.9 g/dL — AB (ref 12.0–15.0)
Lymphocytes Relative: 2 %
Lymphs Abs: 0.5 10*3/uL — ABNORMAL LOW (ref 0.7–4.0)
MCH: 30.7 pg (ref 26.0–34.0)
MCHC: 30.7 g/dL (ref 30.0–36.0)
MCV: 100 fL (ref 78.0–100.0)
Monocytes Absolute: 0.8 10*3/uL (ref 0.1–1.0)
Monocytes Relative: 3 %
NEUTROS PCT: 95 %
Neutro Abs: 24.6 10*3/uL — ABNORMAL HIGH (ref 1.7–7.7)
PLATELETS: 216 10*3/uL (ref 150–400)
RBC: 3.23 MIL/uL — AB (ref 3.87–5.11)
RDW: 16.5 % — AB (ref 11.5–15.5)
WBC: 25.9 10*3/uL — ABNORMAL HIGH (ref 4.0–10.5)

## 2018-05-06 LAB — COMPREHENSIVE METABOLIC PANEL
ALBUMIN: 2 g/dL — AB (ref 3.5–5.0)
ALT: 11 U/L (ref 0–44)
ANION GAP: 9 (ref 5–15)
AST: 20 U/L (ref 15–41)
Alkaline Phosphatase: 77 U/L (ref 38–126)
BILIRUBIN TOTAL: 0.7 mg/dL (ref 0.3–1.2)
BUN: 49 mg/dL — ABNORMAL HIGH (ref 8–23)
CHLORIDE: 98 mmol/L (ref 98–111)
CO2: 19 mmol/L — ABNORMAL LOW (ref 22–32)
Calcium: 7.5 mg/dL — ABNORMAL LOW (ref 8.9–10.3)
Creatinine, Ser: 2.09 mg/dL — ABNORMAL HIGH (ref 0.44–1.00)
GFR calc Af Amer: 25 mL/min — ABNORMAL LOW (ref >60)
GFR, EST NON AFRICAN AMERICAN: 21 mL/min — AB (ref >60)
Glucose, Bld: 67 mg/dL — ABNORMAL LOW (ref 70–99)
POTASSIUM: 3.7 mmol/L (ref 3.5–5.1)
Sodium: 126 mmol/L — ABNORMAL LOW (ref 135–145)
TOTAL PROTEIN: 4 g/dL — AB (ref 6.5–8.1)

## 2018-05-06 LAB — BLOOD GAS, ARTERIAL
ACID-BASE DEFICIT: 7.6 mmol/L — AB (ref 0.0–2.0)
Bicarbonate: 16.9 mmol/L — ABNORMAL LOW (ref 20.0–28.0)
Drawn by: 414221
O2 Content: 3 L/min
O2 Saturation: 96.7 %
PATIENT TEMPERATURE: 98.6
PCO2 ART: 31 mmHg — AB (ref 32.0–48.0)
PO2 ART: 86.5 mmHg (ref 83.0–108.0)
pH, Arterial: 7.355 (ref 7.350–7.450)

## 2018-05-06 LAB — GLUCOSE, CAPILLARY
Glucose-Capillary: 80 mg/dL (ref 70–99)
Glucose-Capillary: 86 mg/dL (ref 70–99)
Glucose-Capillary: 88 mg/dL (ref 70–99)
Glucose-Capillary: 72 mg/dL (ref 70–99)

## 2018-05-06 LAB — C DIFFICILE QUICK SCREEN W PCR REFLEX
C DIFFICILE (CDIFF) INTERP: NOT DETECTED
C DIFFICILE (CDIFF) TOXIN: NEGATIVE
C DIFFICLE (CDIFF) ANTIGEN: NEGATIVE

## 2018-05-06 LAB — MRSA PCR SCREENING: MRSA by PCR: NEGATIVE

## 2018-05-06 MED ORDER — CHLORHEXIDINE GLUCONATE 0.12 % MT SOLN
15.0000 mL | Freq: Two times a day (BID) | OROMUCOSAL | Status: DC
  Administered 2018-05-06 – 2018-05-10 (×8): 15 mL via OROMUCOSAL
  Filled 2018-05-06 (×8): qty 15

## 2018-05-06 MED ORDER — HYDROCORTISONE NA SUCCINATE PF 100 MG IJ SOLR
100.0000 mg | Freq: Once | INTRAMUSCULAR | Status: AC
  Administered 2018-05-06: 100 mg via INTRAVENOUS
  Filled 2018-05-06: qty 2

## 2018-05-06 MED ORDER — SODIUM CHLORIDE 0.9 % IV BOLUS: 500.0000 mL | Freq: Once | INTRAVENOUS | Status: DC

## 2018-05-06 MED ORDER — FUROSEMIDE 10 MG/ML IJ SOLN
60.0000 mg | Freq: Once | INTRAMUSCULAR | Status: AC
  Administered 2018-05-06: 60 mg via INTRAVENOUS
  Filled 2018-05-06: qty 6

## 2018-05-06 MED ORDER — ATROPINE SULFATE 1 MG/10ML IJ SOSY
PREFILLED_SYRINGE | INTRAMUSCULAR | Status: AC
  Filled 2018-05-06: qty 10

## 2018-05-06 MED ORDER — FUROSEMIDE 10 MG/ML IJ SOLN
40.0000 mg | Freq: Two times a day (BID) | INTRAMUSCULAR | Status: DC
  Administered 2018-05-06 – 2018-05-07 (×3): 40 mg via INTRAVENOUS
  Filled 2018-05-06 (×3): qty 4

## 2018-05-06 MED ORDER — ORAL CARE MOUTH RINSE
15.0000 mL | Freq: Two times a day (BID) | OROMUCOSAL | Status: DC
  Administered 2018-05-06 – 2018-05-09 (×6): 15 mL via OROMUCOSAL

## 2018-05-06 MED ORDER — DILTIAZEM HCL ER COATED BEADS 180 MG PO CP24
180.0000 mg | ORAL_CAPSULE | Freq: Every evening | ORAL | Status: DC
  Administered 2018-05-06 – 2018-05-09 (×4): 180 mg via ORAL
  Filled 2018-05-06 (×4): qty 1

## 2018-05-06 NOTE — Progress Notes (Signed)
eLink Physician-Brief Progress Note Patient Name: Katie Pace DOB: 03-30-1936 MRN: [id number]   Date of Service  05/06/2018  HPI/Events of Note  Foul smelling diarrhea x 3. Pt recently on antibiotics.  eICU Interventions  C-Diff screen        Frederik Pear 05/06/2018, 6:08 AM

## 2018-05-06 NOTE — Significant Event (Signed)
Mrs. Streeter is an 74 yof with hx of DM, panhypopituitarism, AAA s/p repair, CKD IV, recent left hip hemiarthroplasty who was admitted 7/25 with sepsis secondary to cellulitis.   She had some dyspnea throughout the day yesterday and was treated with nebs, but overall seemed to be improving and was up out of bed per family. This am, she was hypertensive and treated with a prn. She went on to develop worsening SOB with agonal respirations and drop in BP. HR has been in 40's and dipped into 30's briefly.   She has been started on BiPAP and BP has improved some, now with MAP 66.   CXR with stable pulm edema. She is >4 liters positive per documented I/O's.   Code status was discussed with her husband at the bedside who states that she would not want to be intubated or receive CPR.   Plan to transfer to SDU, change code status to reflect patient's wishes for DNR, give dose of hydrocortisone 100 mg IV given hx of panhypopit and hypotension, continue BiPAP. If BP improves, will diurese.   Discussed with family, RT, and rapid response at bedside.

## 2018-05-06 NOTE — Progress Notes (Signed)
PT Cancellation Note  Patient Details Name: MILLEE DENISE MRN: [id number] DOB: [birth year]   Cancelled Treatment:    Reason Eval/Treat Not Completed: Medical issues which prohibited therapy(pt with respiratory decline, need for bipap and transfer to SDU will hold today)   Alexios Keown B Arlyne Brandes 05/06/2018, 12:17 PM  Elwyn Reach, [place]

## 2018-05-06 NOTE — Progress Notes (Addendum)
PROGRESS NOTE    Katie Pace  VXY:[id number] DOB: 1936/01/06 DOA: 04/28/2018 PCP: [name], MD   Brief Narrative:   82 y.o. F  left hip hemiarthroplasty on 7/20 by Dr. 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   Husband at bedside states that when he went to visit her today at the skilled nursing facility he found her slumped - evaluated by staff at the SNF she was noted to have a temperature of 101 and was confused and hence was sent to the hospital for evaluation.   ED WORK-UP= TMAX 101, hemodynamically stable creatinine of 1.9 which is close to her baseline,  WBCs at 11.4, normal lactic acid of 0.80, she was  mildly tachypneic and tachycardic.    She was found to have significant edema and erythema surrounding her incision of her left hip and extending downwards to the knee.    EDP consulted with Dr. Erlinda Hong, who performed her recent hip surgery who thought that patient could be treated with IV antibiotics at [facility], however I believe it is more reasonable for her to be transferred to [facility] for IV antibiotic therapy, ID consultation and to be formally assessed by her orthopedic surgeon given her recent surgery.    Assessment & Plan   Iatrogenic fluid overload and hypoxic respiratory failure 8/1 Events noted-transfer to stepdown-diuresed-now off BiPAP-nasal cannula-Foley placed-strict I/O-Lasix 40 twice daily for now-hope can transfer back to floor in 1 to 2 days--- CVP every 4 and is ranging 12-14 so definitely volume overloaded  Left lower extremity erythema and edema- cellulitis -Patient did not meet sepsis criteria on admission.  She did have fever however with no other significant vital sign changes and no leukocytosis. -Patient does have  significant edema and erythema to the left thigh -Status post left hip arthroplasty on 04/23/2018 by Dr. Erlinda Hong -Blood cultures show no growth to date -Was given vancomycin, aztreonam and Levaquin given penicillin allergY AND EVENTUALLY ALL AX D/C 7/31 -Orthopedics, Dr.Xu, consulted and appreciated.  Feels is due to dependent edema as patient has been fairly sedentary for the past week-no infection -PT and OT consulted- recommended SNF -SW consulted -edema and erythema improving --UNNA boots on, WOC nurse appreicated -narrowed off of Abx on 7/31 and rpt CBc in am and monitor fever curve  MEt encephalopathy ? From AKI vs steroid pyscosis  rise in creat to 2.3-->2.23-holding opiates-scheduled tylenol-labs am with ammonia to evalaute further-has NASH habitus Change to home Cortef 15 am, 5 pm this evening if able to tolerate p.o.  Wheezing -Patient denies shortness of breath, and wheezing has improved -cxr 7/30 unclear for PNA--received so far 5 days so stop -continue neb treatments PRN, incentive spirometry and flutter valve, and nasal saline -solucortef home dosing -checking am CXR as leukocytosis still--no fevers  Diabetes mellitus, type II -Hemoglobin A1c 6 -Continue insulin sliding scale with CBG 90's -not on any medications at home  Panhypopituitarism -Continue Synthroid and hydrocortisone -Patient had Sheehan syndrome back in the 1970s  Chronic kidney disease, stage IV Hypervolemic hyponatremia with contraction alkalosis Edema 2/2 malunutrition and low albumin -Baseline creatinine approximately 1.7-2.1, currently 2.2 Aim to keep dry see above discussion Lasix should help with fluid mobilization and aqua recess  ACute on Chronic normocytic anemia -Hemoglobin slightly lower, currently 7.7 -1 U PRBC  7/30 transfused -Anemia panel shows adequate iron and iron stores, very elevated B12 at 2060 -FOBT pending still -SCD -Abd US shows no hematoma from 7/30  DVT Prophylaxis  lovenox  discontinued, placed on on SCDs  Code Status: Full  Family Communication: Family at bedside, d/w husband in person  Disposition Plan: Admitted--- currently on stepdown unit and not ready for discharge will need at least 3 or 4 more days Consult[facility] Orthopedic surgery  Procedures  None  Antibiotics     Subjective:   Seen initially this morning and was on BiPAP-revisited at around 2 PM and doing better more awake alert no distress however slightly weak and not talking as clearly as before No chest pain Multiple family members in room   Objective:   Vitals:   05/06/18 1300 05/06/18 1400 05/06/18 1500 05/06/18 1600  BP: (!) 136/116 (!) 161/76 (!) 155/63 (!) 157/57  Pulse: (!) 49 (!) 51 (!) 53 (!) 53  Resp: 15 18 19 15   Temp:    (!) 96.4 F (35.8 C)  TempSrc:    Oral  SpO2: 99% 99% 100% 99%  Weight:      Height:        Intake/Output Summary (Last 24 hours) at 05/06/2018 1705 Last data filed at 05/06/2018 1600 Gross per 24 hour  Intake 540 ml  Output 2875 ml  Net -2335 ml   Filed Weights   04/28/18 0927  Weight: 70.3 kg (155 lb)   Exam  Awake alert in nad -much less confused on nasal cannula  LE swollen and grade 3 edema --Unna boots in place  abd obese nt nd  cta b-No added sound  Neuro intact  Data Reviewed: I have personally reviewed following labs and imaging studies  CBC: Recent Labs  Lab 05/02/18 0322 05/03/18 0441 05/03/18 2351 05/04/18 0354 05/05/18 0415 05/06/18 0433  WBC 12.6* 12.3*  --  14.4* 13.2* 25.9*  NEUTROABS  --   --   --   --  11.6* 24.6*  HGB 7.7* 7.7* 9.6* 9.4* 9.2* 9.9*  HCT 24.8* 24.8* 29.7* 29.8* 28.7* 32.3*  MCV 99.2 98.4  --  95.5 94.4 100.0  PLT 251 245  --  263 233 161   Basic Metabolic Panel: Recent Labs  Lab 05/02/18 0322 05/03/18 0441 05/04/18 0354 05/05/18 0415 05/06/18 0433  NA 127* 129* 128* 133* 126*  K 4.1 4.4 4.1 3.9 3.7  CL 99 102 100 104 98  CO2 18* 19* 20* 21* 19*  GLUCOSE 140* 111* 143* 96 67*    BUN 50* 54* 58* 55* 49*  CREATININE 2.07* 2.18* 2.31* 2.23* 2.09*  CALCIUM 7.7* 7.8* 7.7* 7.8* 7.5*   GFR: Estimated Creatinine Clearance: 20.6 mL/min (A) (by C-G formula based on SCr of 2.09 mg/dL (H)). Liver Function Tests: Recent Labs  Lab 05/05/18 0415 05/06/18 0433  AST 19 20  ALT 12 11  ALKPHOS 58 77  BILITOT 0.7 0.7  PROT 4.7* 4.0*  ALBUMIN 2.3* 2.0*   No results for input(s): LIPASE, AMYLASE in the last 168 hours. No results for input(s): AMMONIA in the last 168 hours. Coagulation Profile: No results for input(s): INR, PROTIME in the last 168 hours. Cardiac Enzymes: No results for input(s): CKTOTAL, CKMB, CKMBINDEX, TROPONINI in the last 168 hours. BNP (last 3 results) No results for input(s): PROBNP in the last 8760 hours. HbA1C: No results for input(s): HGBA1C in the last 72 hours. CBG: Recent Labs  Lab 05/05/18 1856 05/05/18 2101 05/06/18 0904 05/06/18 1210  05/06/18 1616  GLUCAP 142* 132* 80 86 88   Lipid Profile: No results for input(s): CHOL, HDL, LDLCALC, TRIG, CHOLHDL, LDLDIRECT in the last 72 hours. Thyroid Function Tests: No results for input(s): TSH, T4TOTAL, FREET4, T3FREE, THYROIDAB in the last 72 hours. Anemia Panel: No results for input(s): VITAMINB12, FOLATE, FERRITIN, TIBC, IRON, RETICCTPCT in the last 72 hours. Urine analysis:    Component Value Date/Time   COLORURINE YELLOW 04/28/2018 0925   APPEARANCEUR HAZY (A) 04/28/2018 0925   LABSPEC 1.018 04/28/2018 0925   PHURINE 5.0 04/28/2018 0925   GLUCOSEU NEGATIVE 04/28/2018 0925   HGBUR NEGATIVE 04/28/2018 0925   BILIRUBINUR NEGATIVE 04/28/2018 0925   KETONESUR 5 (A) 04/28/2018 0925   PROTEINUR 100 (A) 04/28/2018 0925   UROBILINOGEN 0.2 04/01/2012 0510   NITRITE NEGATIVE 04/28/2018 0925   LEUKOCYTESUR NEGATIVE 04/28/2018 0925   Sepsis Labs: @LABRCNTIP (procalcitonin:4,lacticidven:4)  ) Recent Results (from the past 240 hour(s))  Blood Culture (routine x 2)     Status: None    Collection Time: 04/28/18  9:37 AM  Result Value Ref Range Status   Specimen Description BLOOD LEFT ARM  Final   Special Requests   Final    BOTTLES DRAWN AEROBIC AND ANAEROBIC Blood Culture adequate volume   Culture   Final    NO GROWTH 5 DAYS Performed at [facility], [address], [place], [place] [address]    Report Status 05/03/2018 FINAL  Final  Blood Culture (routine x 2)     Status: None   Collection Time: 04/28/18 10:08 AM  Result Value Ref Range Status   Specimen Description BLOOD LEFT HAND  Final   Special Requests   Final    BOTTLES DRAWN AEROBIC AND ANAEROBIC Blood Culture adequate volume   Culture   Final    NO GROWTH 5 DAYS Performed at [facility], [address], [place], [place] [address]    Report Status 05/03/2018 FINAL  Final  MRSA PCR Screening     Status: None   Collection Time: 05/06/18  8:20 AM  Result Value Ref Range Status   MRSA by PCR NEGATIVE NEGATIVE Final    Comment:        The GeneXpert MRSA Assay (FDA approved for NASAL specimens only), is one component of a comprehensive MRSA colonization surveillance program. It is not intended to diagnose MRSA infection nor to guide or monitor treatment for MRSA infections. Performed at [facility], [address], [place], [place] [address]   C difficile quick scan w PCR reflex     Status: None   Collection Time: 05/06/18  8:42 AM  Result Value Ref Range Status   C Diff antigen NEGATIVE NEGATIVE Final   C Diff toxin NEGATIVE NEGATIVE Final   C Diff interpretation No C. difficile detected.  Final      Radiology Studies: Dg Chest Port 1 View  Result Date: 05/06/2018 CLINICAL DATA:  Shortness of breath. EXAM: PORTABLE CHEST 1 VIEW COMPARISON:  Chest radiograph May 05, 2018 FINDINGS: Stable cardiomegaly. Tortuous calcified aorta. Pulmonary vascular congestion and interstitial prominence. Retrocardiac consolidation. Small to moderate bilateral pleural effusions. No pneumothorax. RIGHT PICC in  situ. Soft tissue planes and included osseous structures are unchanged. IMPRESSION: Stable cardiomegaly and pulmonary edema. Persistent retrocardiac consolidation with small to moderate pleural effusions. Increasingly elevated RIGHT hemidiaphragm compatible with underlying atelectasis. Aortic Atherosclerosis (ICD10-I70.0). Electronically Signed   By: Elon Alas M.D.   On: 05/06/2018 03:31   Dg Chest [facility]  Result Date: 05/05/2018 CLINICAL DATA:  Difficulty breathing EXAM: PORTABLE CHEST 1 VIEW COMPARISON:  05/03/2018, 04/30/2018, 04/28/2018 FINDINGS: Right upper extremity catheter tip overlies the SVC. Cardiomegaly with vascular congestion and mild pulmonary edema. Small pleural effusions and increasing bibasilar airspace disease. Aortic atherosclerosis. No pneumothorax. IMPRESSION: 1. Cardiomegaly with vascular congestion and mild edema 2. Small pleural effusions with increasing left greater than right bibasilar airspace disease, atelectasis versus pneumonia Electronically Signed   By: Donavan Foil M.D.   On: 05/05/2018 19:44   Dg Abd Portable 1v  Result Date: 05/06/2018 CLINICAL DATA:  Abdominal pain and distension. EXAM: PORTABLE ABDOMEN - 1 VIEW COMPARISON:  Pelvic radiograph May 03, 2018 FINDINGS: Bowel gas pattern is nondilated and nonobstructive. Ovoid densities RIGHT abdomen compatible with pills. Vascular calcifications. No intra-abdominal mass effect. Lumbar spine hardware. Bilateral hip hemi arthroplasties. IMPRESSION: 1. Non-specific bowel gas pattern. Electronically Signed   By: Elon Alas M.D.   On: 05/06/2018 03:33     Scheduled Meds: . sodium chloride   Intravenous Once  . acetaminophen  650 mg Oral Q6H  . atenolol  100 mg Oral QHS  . chlorhexidine  15 mL Mouth Rinse BID  . diltiazem  180 mg Oral QPM  . dipyridamole-aspirin  1 capsule Oral BID  . furosemide  40 mg Intravenous Q12H  . hydrocortisone  15 mg Oral Daily   And  . hydrocortisone  5 mg Oral QHS  .  insulin aspart  0-15 Units Subcutaneous TID WC  . insulin aspart  0-5 Units Subcutaneous QHS  . levothyroxine  75 mcg Oral QAC breakfast  . mouth rinse  15 mL Mouth Rinse q12n4p  . pantoprazole  40 mg Oral Daily  . sodium chloride flush  10-40 mL Intracatheter Q12H   Continuous Infusions:    LOS: 8 days   Time Spent in minutes   45 minutes  Verneita Griffes, MD [facility] [phone]

## 2018-05-06 NOTE — Progress Notes (Signed)
RT transported pt to [place] [facility] from [facility]. RT aware.

## 2018-05-06 NOTE — Significant Event (Signed)
Rapid Response Event Note    Called about pt with increased work of breathing and wheezing along with hypotension. Pt given neb treatment prior to my arrival. RT called to assess pt and at bedside. Upon arrival, pt in bed being cleaned by bedside RN and NT after stool incontinence. HR 102 BP 86/71, RR 22, spO2 96 on 3L. Lungs rhonchorous throughout. Pt noted to have generalized edema.  Pt HR dropped to low 40s once situated in bed and linen changed (appears to have been 40-50s prior).    Interventions: abg cxr Eric Form NP- waiting for husband to come up with paperwork regarding pt wishes and code status.  kub Labs ordered Placed on Bipap Dr Myna Hidalgo at bedside EKG Solumedrol Pt made DNR- family does not want compressions or intubation Transfer to SDU ordered   Plan of Care (if not transferred): Pt transferred to 2H09  Event Summary:  called  at  [place]   Event ended at: [place]

## 2018-05-06 NOTE — Progress Notes (Signed)
Pt has not been feeling good since the beginning of the shift. Provider on call has been notified several times with new orders. Pt reported  Pain on her back that resolved after straight cath was done. Later, pt started having min - mod work of breathing. The rapid response RN and respiratory therapist were notified. New orders for chest x-ray, blood work and EKG has been done. Currently, pt is on BIPAP waiting to be transferred to another unit. Four family members including the pt's husband are at bedside and the doctor is talking to them to address the patients' condition.

## 2018-05-06 NOTE — Care Management Important Message (Signed)
Important Message  Patient Details  Name: Katie Pace MRN: [id number] Date of Birth: 01/03/1936   Medicare Important Message Given:  Yes  Provided to family in room    Carles Collet, RN 05/06/2018, 11:42 AM

## 2018-05-07 ENCOUNTER — Inpatient Hospital Stay (HOSPITAL_COMMUNITY): Payer: Medicare Other

## 2018-05-07 LAB — CBC WITH DIFFERENTIAL/PLATELET
Abs Immature Granulocytes: 0.3 10*3/uL — ABNORMAL HIGH (ref 0.0–0.1)
BASOS PCT: 0 %
Basophils Absolute: 0 10*3/uL (ref 0.0–0.1)
EOS ABS: 0 10*3/uL (ref 0.0–0.7)
EOS PCT: 0 %
HEMATOCRIT: 29.7 % — AB (ref 36.0–46.0)
Hemoglobin: 9.6 g/dL — ABNORMAL LOW (ref 12.0–15.0)
IMMATURE GRANULOCYTES: 2 %
Lymphocytes Relative: 2 %
Lymphs Abs: 0.4 10*3/uL — ABNORMAL LOW (ref 0.7–4.0)
MCH: 30.4 pg (ref 26.0–34.0)
MCHC: 32.3 g/dL (ref 30.0–36.0)
MCV: 94 fL (ref 78.0–100.0)
Monocytes Absolute: 0.5 10*3/uL (ref 0.1–1.0)
Monocytes Relative: 3 %
NEUTROS PCT: 93 %
Neutro Abs: 18.5 10*3/uL — ABNORMAL HIGH (ref 1.7–7.7)
PLATELETS: 243 10*3/uL (ref 150–400)
RBC: 3.16 MIL/uL — AB (ref 3.87–5.11)
RDW: 16.1 % — AB (ref 11.5–15.5)
WBC: 19.8 10*3/uL — AB (ref 4.0–10.5)

## 2018-05-07 LAB — GLUCOSE, CAPILLARY
GLUCOSE-CAPILLARY: 79 mg/dL (ref 70–99)
Glucose-Capillary: 96 mg/dL (ref 70–99)
Glucose-Capillary: 107 mg/dL — ABNORMAL HIGH (ref 70–99)
Glucose-Capillary: 102 mg/dL — ABNORMAL HIGH (ref 70–99)

## 2018-05-07 LAB — COMPREHENSIVE METABOLIC PANEL
ALK PHOS: 76 U/L (ref 38–126)
ALT: 12 U/L (ref 0–44)
AST: 21 U/L (ref 15–41)
Albumin: 2.1 g/dL — ABNORMAL LOW (ref 3.5–5.0)
Anion gap: 9 (ref 5–15)
BILIRUBIN TOTAL: 0.8 mg/dL (ref 0.3–1.2)
BUN: 50 mg/dL — AB (ref 8–23)
CALCIUM: 7.7 mg/dL — AB (ref 8.9–10.3)
CO2: 22 mmol/L (ref 22–32)
CREATININE: 2.11 mg/dL — AB (ref 0.44–1.00)
Chloride: 101 mmol/L (ref 98–111)
GFR, EST AFRICAN AMERICAN: 24 mL/min — AB (ref >60)
GFR, EST NON AFRICAN AMERICAN: 21 mL/min — AB (ref >60)
Glucose, Bld: 85 mg/dL (ref 70–99)
Potassium: 3.6 mmol/L (ref 3.5–5.1)
Sodium: 132 mmol/L — ABNORMAL LOW (ref 135–145)
TOTAL PROTEIN: 4.4 g/dL — AB (ref 6.5–8.1)

## 2018-05-07 LAB — URINE CULTURE: Culture: NO GROWTH

## 2018-05-07 MED ORDER — FUROSEMIDE 10 MG/ML IJ SOLN
60.0000 mg | Freq: Two times a day (BID) | INTRAMUSCULAR | Status: DC
  Administered 2018-05-08 – 2018-05-09 (×3): 60 mg via INTRAVENOUS
  Filled 2018-05-07 (×3): qty 6

## 2018-05-07 NOTE — Progress Notes (Signed)
PROGRESS NOTE    Katie Pace  WIO:[id number] DOB: 1935-11-16 DOA: 04/28/2018 PCP: [name], MD   Brief Narrative:   82 y.o. F  left hip hemiarthroplasty on 7/20 by Dr. 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   Husband at bedside states that when he went to visit her today at the skilled nursing facility he found her slumped - evaluated by staff at the SNF she was noted to have a temperature of 101 and was confused and hence was sent to the hospital for evaluation.   ED WORK-UP= TMAX 101, hemodynamically stable creatinine of 1.9 which is close to her baseline,  WBCs at 11.4, normal lactic acid of 0.80, she was  mildly tachypneic and tachycardic.    She was found to have significant edema and erythema surrounding her incision of her left hip and extending downwards to the knee.    EDP consulted with Dr. Erlinda Hong, who performed her recent hip surgery who thought that patient could be treated with IV antibiotics at [facility], however I believe it is more reasonable for her to be transferred to [facility] for IV antibiotic therapy, ID consultation and to be formally assessed by her orthopedic surgeon given her recent surgery.    Assessment & Plan   Iatrogenic fluid overload and hypoxic respiratory failure 8/1 Events noted-transfer to stepdown 8/2 am-diuresed-now off BiPAP-nasal cannula-Foley placed-strict I/O-Lasix increased to 60 bid-I/o= is now -4.1 liters Still tenuous-keep SDU status today CVP ~ 13--needs more diuresis--increasing lasix as above  Left lower extremity erythema and edema- cellulitis -Patient did not meet sepsis criteria on admission.  She did have fever however with no other significant vital sign changes and no leukocytosis. -Patient does  have significant edema and erythema to the left thigh -Status post left hip arthroplasty on 04/23/2018 by Dr. Erlinda Hong -Blood cultures show no growth to date -Was given vancomycin, aztreonam and Levaquin given penicillin allergY AND EVENTUALLY ALL AX D/C 7/31 -Orthopedics, Dr.Xu, consulted and appreciated.  Feels is due to dependent edema 2/2 sedentary for the past week-no infection -PT and OT consulted- recommended SNF -SW consulted -edema and erythema improving --UNNA boots on, WOC nurse appreicated -narrowed off of Abx on 7/31 and rpt CBc in am and monitor fever curve  MEt encephalopathy ? From AKI vs steroid pyscosis- creat to ~ 2.1 now -holding opiates-scheduled tylenol-labs am with ammonia to evalaute further-has NASH habitus Change to home Cortef 15 am, 5 pm  Wheezing -Patient denies shortness of breath, and wheezing has improved -cxr 7/30 unclear for PNA--received so far 5 days so stop -continue neb treatments PRN, incentive spirometry and flutter valve, and nasal saline -solucortef home dosing  Diabetes mellitus, type II -Hemoglobin A1c 6 -Continue insulin sliding scale with CBG 90's -not on any medications at home  Panhypopituitarism -Continue Synthroid and hydrocortisone -Patient had Sheehan syndrome back in the 1970s  Chronic kidney disease, stage IV Hypervolemic hyponatremia with contraction alkalosis Edema 2/2 malunutrition and low albumin -Baseline creatinine approximately 1.7-2.1, currently 2.1 Aim to keep dry see above discussion Lasix should help with fluid mobilization and aquaresis  ACute on Chronic normocytic anemia -Hemoglobin slightly lower, currently 7.7 -1 U PRBC 7/30 transfused -Anemia panel shows adequate iron and iron stores, very elevated B12 at 2060 -FOBT pending still -SCD -Abd US shows  no hematoma from 7/30  DVT Prophylaxis  lovenox discontinued, placed on on SCDs  Code Status: Full  Family Communication: Family at bedside, d/w husband in  person  Disposition Plan: Admitted--- currently on stepdown unit and not ready for discharge will need at least 3 or 4 more days [facility] Orth[facility]  Procedures  None  Antibiotics     Subjective:   Seen this am-did not sleep at all last pm Doing fair SOB some-confused but able to verbalise No pains Eating minimally  Objective:   Vitals:   05/06/18 2230 05/07/18 0030 05/07/18 0354 05/07/18 0455  BP: (!) 143/48 (!) 180/73 (!) 193/73 (!) 168/59  Pulse: (!) 49 (!) 57 (!) 57 (!) 52  Resp: _0 Temp:  (!) 97.4 F (36.3 C) 97.7 F (36.5 C)   TempSrc:  Oral Oral   SpO2: 93% 92% 95% 95%  Weight:      Height:        Intake/Output Summary (Last 24 hours) at 05/07/2018 1636 Last data filed at 05/07/2018 0845 Gross per 24 hour  Intake 120 ml  Output 2900 ml  Net -2780 ml   Filed Weights   04/28/18 0927  Weight: 70.3 kg (155 lb)   Exam  Awake alert in nad -mild confusion  LE swollen and grade 3 edema --Unna boots removed and both feet look great and much less swollen with good healing of bilateral leg sores  abd obese nt nd  cta b-No added sound  Neuro intact  Data Reviewed: I have personally reviewed following labs and imaging studies  CBC: Recent Labs  Lab 05/03/18 0441 05/03/18 2351 05/04/18 0354 05/05/18 0415 05/06/18 0433 05/07/18 0400  WBC 12.3*  --  14.4* 13.2* 25.9* 19.8*  NEUTROABS  --   --   --  11.6* 24.6* 18.5*  HGB 7.7* 9.6* 9.4* 9.2* 9.9* 9.6*  HCT 24.8* 29.7* 29.8* 28.7* 32.3* 29.7*  MCV 98.4  --  95.5 94.4 100.0 94.0  PLT 245  --  263 233 216 329   Basic Metabolic Panel: Recent Labs  Lab 05/03/18 0441 05/04/18 0354 05/05/18 0415 05/06/18 0433 05/07/18 0400  NA 129* 128* 133* 126* 132*  K 4.4 4.1 3.9 3.7 3.6  CL 102 100 104 98 101  CO2 19* 20* 21* 19* 22  GLUCOSE 111* 143* 96 67* 85  BUN 54* 58* 55* 49* 50*  CREATININE 2.18* 2.31* 2.23* 2.09* 2.11*  CALCIUM 7.8* 7.7* 7.8* 7.5* 7.7*   GFR: Estimated  Creatinine Clearance: 20.4 mL/min (A) (by C-G formula based on SCr of 2.11 mg/dL (H)). Liver Function Tests: Recent Labs  Lab 05/05/18 0415 05/06/18 0433 05/07/18 0400  AST _1 ALT _2 ALKPHOS 58 77 76  BILITOT 0.7 0.7 0.8  PROT 4.7* 4.0* 4.4*  ALBUMIN 2.3* 2.0* 2.1*   No results for input(s): LIPASE, AMYLASE in the last 168 hours. No results for input(s): AMMONIA in the last 168 hours. Coagulation Profile: No results for input(s): INR, PROTIME in the last 168 hours. Cardiac Enzymes: No results for input(s): CKTOTAL, CKMB, CKMBINDEX, TROPONINI in the last 168 hours. BNP (last 3 results) No results for input(s): PROBNP in the last 8760 hours. HbA1C: No results for input(s): HGBA1C in the last 72 hours. CBG: Recent Labs  Lab 05/06/18 1616 05/06/18 2122 05/07/18 0633 05/07/18 1135 05/07/18 1626  GLUCAP 88 72 79 96 107*   Lipid Profile: No results for input(s): CHOL, HDL, LDLCALC, TRIG,  CHOLHDL, LDLDIRECT in the last 72 hours. Thyroid Function Tests: No results for input(s): TSH, T4TOTAL, FREET4, T3FREE, THYROIDAB in the last 72 hours. Anemia Panel: No results for input(s): VITAMINB12, FOLATE, FERRITIN, TIBC, IRON, RETICCTPCT in the last 72 hours. Urine analysis:    Component Value Date/Time   COLORURINE YELLOW 04/28/2018 0925   APPEARANCEUR HAZY (A) 04/28/2018 0925   LABSPEC 1.018 04/28/2018 0925   PHURINE 5.0 04/28/2018 0925   GLUCOSEU NEGATIVE 04/28/2018 0925   HGBUR NEGATIVE 04/28/2018 0925   BILIRUBINUR NEGATIVE 04/28/2018 0925   KETONESUR 5 (A) 04/28/2018 0925   PROTEINUR 100 (A) 04/28/2018 0925   UROBILINOGEN 0.2 04/01/2012 0510   NITRITE NEGATIVE 04/28/2018 0925   LEUKOCYTESUR NEGATIVE 04/28/2018 0925   Sepsis Labs: _0 (procalcitonin:4,lacticidven:4)  ) Recent Results (from the past 240 hour(s))  Blood Culture (routine x 2)     Status: None   Collection Time: 04/28/18  9:37 AM  Result Value Ref Range Status   Specimen Description  BLOOD LEFT ARM  Final   Special Requests   Final    BOTTLES DRAWN AEROBIC AND ANAEROBIC Blood Culture adequate volume   Culture   Final    NO GROWTH 5 DAYS Performed at [facility], [address] [place], [place] [address]    Report Status 05/03/2018 FINAL  Final  Blood Culture (routine x 2)     Status: None   Collection Time: 04/28/18 10:08 AM  Result Value Ref Range Status   Specimen Description BLOOD LEFT HAND  Final   Special Requests   Final    BOTTLES DRAWN AEROBIC AND ANAEROBIC Blood Culture adequate volume   Culture   Final    NO GROWTH 5 DAYS Performed at [facility], [address], [place], [place] [address]    Report Status 05/03/2018 FINAL  Final  MRSA PCR Screening     Status: None   Collection Time: 05/06/18  8:20 AM  Result Value Ref Range Status   MRSA by PCR NEGATIVE NEGATIVE Final    Comment:        The GeneXpert MRSA Assay (FDA approved for NASAL specimens only), is one component of a comprehensive MRSA colonization surveillance program. It is not intended to diagnose MRSA infection nor to guide or monitor treatment for MRSA infections. Performed at [facility], [place] [address] [place], [place] [address]   C difficile quick scan w PCR reflex     Status: None   Collection Time: 05/06/18  8:42 AM  Result Value Ref Range Status   C Diff antigen NEGATIVE NEGATIVE Final   C Diff toxin NEGATIVE NEGATIVE Final   C Diff interpretation No C. difficile detected.  Final  Urine Culture     Status: None   Collection Time: 05/06/18  8:45 AM  Result Value Ref Range Status   Specimen Description URINE, RANDOM  Final   Special Requests NONE  Final   Culture   Final    NO GROWTH Performed at [facility], [address], [place], [place] [address]    Report Status 05/07/2018 FINAL  Final      Radiology Studies: Dg Chest 2 View  Result Date: 05/07/2018 CLINICAL DATA:  Follow-up left retrocardiac density EXAM: CHEST - 2 VIEW COMPARISON:   05/06/2018 FINDINGS: Cardiac shadow remains enlarged. Aortic calcifications are again seen. Stable left basilar atelectasis is noted with slight increase in bilateral pleural effusions when compared with the prior exam. No bony abnormality is noted. IMPRESSION: Increase in  bilateral pleural effusions. Electronically Signed   By: Inez Catalina M.D.   On: 05/07/2018 09:53   Dg Chest Port 1 View  Result Date: 05/06/2018 CLINICAL DATA:  Shortness of breath. EXAM: PORTABLE CHEST 1 VIEW COMPARISON:  Chest radiograph May 05, 2018 FINDINGS: Stable cardiomegaly. Tortuous calcified aorta. Pulmonary vascular congestion and interstitial prominence. Retrocardiac consolidation. Small to moderate bilateral pleural effusions. No pneumothorax. RIGHT PICC in situ. Soft tissue planes and included osseous structures are unchanged. IMPRESSION: Stable cardiomegaly and pulmonary edema. Persistent retrocardiac consolidation with small to moderate pleural effusions. Increasingly elevated RIGHT hemidiaphragm compatible with underlying atelectasis. Aortic Atherosclerosis (ICD10-I70.0). Electronically Signed   By: Elon Alas M.D.   On: 05/06/2018 03:31   Dg Chest Port 1 View  Result Date: 05/05/2018 CLINICAL DATA:  Difficulty breathing EXAM: PORTABLE CHEST 1 VIEW COMPARISON:  05/03/2018, 04/30/2018, 04/28/2018 FINDINGS: Right upper extremity catheter tip overlies the SVC. Cardiomegaly with vascular congestion and mild pulmonary edema. Small pleural effusions and increasing bibasilar airspace disease. Aortic atherosclerosis. No pneumothorax. IMPRESSION: 1. Cardiomegaly with vascular congestion and mild edema 2. Small pleural effusions with increasing left greater than right bibasilar airspace disease, atelectasis versus pneumonia Electronically Signed   By: Donavan Foil M.D.   On: 05/05/2018 19:44   Dg Abd Portable 1v  Result Date: 05/06/2018 CLINICAL DATA:  Abdominal pain and distension. EXAM: PORTABLE ABDOMEN - 1 VIEW  COMPARISON:  Pelvic radiograph May 03, 2018 FINDINGS: Bowel gas pattern is nondilated and nonobstructive. Ovoid densities RIGHT abdomen compatible with pills. Vascular calcifications. No intra-abdominal mass effect. Lumbar spine hardware. Bilateral hip hemi arthroplasties. IMPRESSION: 1. Non-specific bowel gas pattern. Electronically Signed   By: Elon Alas M.D.   On: 05/06/2018 03:33     Scheduled Meds: . sodium chloride   Intravenous Once  . acetaminophen  650 mg Oral Q6H  . atenolol  100 mg Oral QHS  . chlorhexidine  15 mL Mouth Rinse BID  . diltiazem  180 mg Oral QPM  . dipyridamole-aspirin  1 capsule Oral BID  . furosemide  40 mg Intravenous Q12H  . hydrocortisone  15 mg Oral Daily   And  . hydrocortisone  5 mg Oral QHS  . insulin aspart  0-15 Units Subcutaneous TID WC  . insulin aspart  0-5 Units Subcutaneous QHS  . levothyroxine  75 mcg Oral QAC breakfast  . mouth rinse  15 mL Mouth Rinse q12n4p  . pantoprazole  40 mg Oral Daily  . sodium chloride flush  10-40 mL Intracatheter Q12H   Continuous Infusions:    LOS: 9 days   Time Spent in minutes   45 minutes  Verneita Griffes, MD [facility] [phone]

## 2018-05-08 LAB — GLUCOSE, CAPILLARY
Glucose-Capillary: 93 mg/dL (ref 70–99)
Glucose-Capillary: 94 mg/dL (ref 70–99)
Glucose-Capillary: 125 mg/dL — ABNORMAL HIGH (ref 70–99)
Glucose-Capillary: 105 mg/dL — ABNORMAL HIGH (ref 70–99)

## 2018-05-08 LAB — CBC WITH DIFFERENTIAL/PLATELET
Abs Immature Granulocytes: 0.1 10*3/uL (ref 0.0–0.1)
BASOS PCT: 0 %
Basophils Absolute: 0 10*3/uL (ref 0.0–0.1)
EOS ABS: 0.1 10*3/uL (ref 0.0–0.7)
EOS PCT: 1 %
HCT: 27.1 % — ABNORMAL LOW (ref 36.0–46.0)
Hemoglobin: 8.6 g/dL — ABNORMAL LOW (ref 12.0–15.0)
IMMATURE GRANULOCYTES: 1 %
Lymphocytes Relative: 7 %
Lymphs Abs: 0.6 10*3/uL — ABNORMAL LOW (ref 0.7–4.0)
MCH: 30.7 pg (ref 26.0–34.0)
MCHC: 31.7 g/dL (ref 30.0–36.0)
MCV: 96.8 fL (ref 78.0–100.0)
MONOS PCT: 6 %
Monocytes Absolute: 0.5 10*3/uL (ref 0.1–1.0)
NEUTROS ABS: 6.9 10*3/uL (ref 1.7–7.7)
Neutrophils Relative %: 85 %
PLATELETS: 177 10*3/uL (ref 150–400)
RBC: 2.8 MIL/uL — AB (ref 3.87–5.11)
RDW: 16.5 % — AB (ref 11.5–15.5)
WBC: 8.1 10*3/uL (ref 4.0–10.5)

## 2018-05-08 LAB — COMPREHENSIVE METABOLIC PANEL
ALBUMIN: 1.9 g/dL — AB (ref 3.5–5.0)
ALT: 9 U/L (ref 0–44)
ANION GAP: 10 (ref 5–15)
AST: 16 U/L (ref 15–41)
Alkaline Phosphatase: 58 U/L (ref 38–126)
BILIRUBIN TOTAL: 0.8 mg/dL (ref 0.3–1.2)
BUN: 44 mg/dL — ABNORMAL HIGH (ref 8–23)
CHLORIDE: 105 mmol/L (ref 98–111)
CO2: 22 mmol/L (ref 22–32)
Calcium: 6.5 mg/dL — ABNORMAL LOW (ref 8.9–10.3)
Creatinine, Ser: 1.87 mg/dL — ABNORMAL HIGH (ref 0.44–1.00)
GFR calc Af Amer: 28 mL/min — ABNORMAL LOW (ref >60)
GFR calc non Af Amer: 24 mL/min — ABNORMAL LOW (ref >60)
GLUCOSE: 77 mg/dL (ref 70–99)
POTASSIUM: 2.5 mmol/L — AB (ref 3.5–5.1)
SODIUM: 137 mmol/L (ref 135–145)
TOTAL PROTEIN: 4 g/dL — AB (ref 6.5–8.1)

## 2018-05-08 LAB — MAGNESIUM: Magnesium: 2.1 mg/dL (ref 1.7–2.4)

## 2018-05-08 MED ORDER — POTASSIUM CHLORIDE CRYS ER 20 MEQ PO TBCR
40.0000 meq | EXTENDED_RELEASE_TABLET | Freq: Two times a day (BID) | ORAL | Status: DC
  Administered 2018-05-08 – 2018-05-09 (×3): 40 meq via ORAL
  Filled 2018-05-08 (×3): qty 2

## 2018-05-08 MED ORDER — HYDRALAZINE HCL 20 MG/ML IJ SOLN
10.0000 mg | INTRAMUSCULAR | Status: DC | PRN
Start: 2018-05-08 — End: 2018-05-10
  Administered 2018-05-08 – 2018-05-10 (×3): 10 mg via INTRAVENOUS
  Filled 2018-05-08 (×3): qty 1

## 2018-05-08 NOTE — Progress Notes (Signed)
CRITICAL VALUE ALERT  Critical Value:  K+ 2.5  Date & Time Notied:  8/4 10:40 AM  Provider Notified: Samtani  Orders Received/Actions taken: paged, awaiting orders.

## 2018-05-08 NOTE — Progress Notes (Signed)
PROGRESS NOTE    Katie Pace  TIR:[id number] DOB: 1936-04-21 DOA: 04/28/2018 PCP: [name], MD   Brief Narrative:   82 y.o. F  left hip hemiarthroplasty on 7/20 by Dr. 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   Husband at bedside states that when he went to visit her today at the skilled nursing facility he found her slumped - evaluated by staff at the SNF she was noted to have a temperature of 101 and was confused and hence was sent to the hospital for evaluation.   ED WORK-UP= TMAX 101, hemodynamically stable creatinine of 1.9 which is close to her baseline,  WBCs at 11.4, normal lactic acid of 0.80, she was  mildly tachypneic and tachycardic.    She was found to have significant edema and erythema surrounding her incision of her left hip and extending downwards to the knee.    EDP consulted with Dr. Erlinda Hong, who performed her recent hip surgery who thought that patient could be treated with IV antibiotics at [facility], however I believe it is more reasonable for her to be transferred to [facility] for IV antibiotic therapy, ID consultation and to be formally assessed by her orthopedic surgeon given her recent surgery.    Assessment & Plan   Iatrogenic fluid overload and hypoxic respiratory failure 8/1 Events noted-transfer to stepdown 8/2 am-diuresed-now off BiPAP-nasal cannula-Foley placed-strict I/O-Lasix increased to 60 bid-I/o= currently -5.6 L Proved but tenuous-keep SDU status today CVP ~ 13--needs more diuresis Labs in a.m.  Hypokalemia Potassium 2.5 Replacing with p.o. K. Dur 40 twice daily Check magnesium this morning and recheck labs in the morning  Left lower extremity erythema and edema- cellulitis -no sepsis criteria on  admission-fever however with no other significant vital sign changes and no leukocytosis. -Status post left hip arthroplasty on 04/23/2018 by Dr. Erlinda Hong -Blood cultures show no growth to date -Was given vancomycin, aztreonam and Levaquin given penicillin allergY AND EVENTUALLY ALL AX D/C 7/31 -Orthopedics, Dr.Xu, consulted and appreciated.  Feels is due to dependent edema 2/2 sedentary for the past week-no infection -PT and OT consulted- recommended SNF -SW consulted -edema and erythema improving --UNNA boots on, WOC nurse appreciated--wounds have significantly improved -narrowed off of Abx on 7/31 and rpt CBc in am and monitor fever curve -Leukocytosis has completely resolved on 8/4 so I suspect an element of aspiration  Met encephalopathy ? From AKI vs steroid pyschosis- creat to ~ 2.1 now---> 1.87 -holding opiates-scheduled tylenol Change to home Cortef 15 am, 5 pm  Wheezing -Patient denies shortness of breath, and wheezing has improved -cxr 7/30 unclear for PNA---last x-ray 8/3 more suggestive of fluid and patient clinically improving with diuresis -Leukocytosis points to possible aspiration but patient was also on steroids so it is unclear which is which and we will never have complete clarity although she is improved -continue neb treatments PRN, incentive spirometry and flutter valve, and nasal saline -solucortef home dosing  Diabetes mellitus, type II -Hemoglobin A1c 6 -Continue insulin sliding scale with CBG 90's -not on any medications at home -Discontinue blood sugar checks  Panhypopituitarism -Continue Synthroid and hydrocortisone -Patient had Sheehan syndrome back in the 1970s  Chronic kidney disease, stage IV Hypervolemic hyponatremia with contraction alkalosis Edema 2/2 malunutrition and low albumin -Baseline creatinine approximately 1.7-2.1 Aim to  keep dry see above discussion Lasix should help with fluid mobilization and aquaresis and has improved things for  her  ACute on Chronic normocytic anemia -Hemoglobin slightly lower, currently 7.7 -1 U PRBC 7/30 transfused -Anemia panel shows adequate iron and iron stores, very elevated B12 at 2060 -FOBT pending still -SCD -Abd US shows no hematoma from 7/30  DVT Prophylaxis  lovenox discontinued, placed on on SCDs  Code Status: Full  Family Communication: Family at bedside, d/w son and daughter-in-law in detail 8/4-  Disposition Plan: Admitted--- currently on stepdown unit--not ready for discharge [facility]  Procedures  None  Antibiotics     Subjective:   More awake alert, on nasal cannula now, coherent and making more sense, eating minimally now but ate a good meal per family overnight  Objective:   Vitals:   05/07/18 1700 05/07/18 1759 05/07/18 1937 05/08/18 0817  BP:  (!) 168/48 (!) 151/56 (!) 175/66  Pulse: (!) 50 (!) 47 (!) 47 (!) 49  Resp: 20 13 13 13   Temp:  98.8 F (37.1 C) 97.6 F (36.4 C) 97.6 F (36.4 C)  TempSrc:  Oral Axillary Oral  SpO2: 98% 98% 97% 96%  Weight:      Height:        Intake/Output Summary (Last 24 hours) at 05/08/2018 1056 Last data filed at 05/08/2018 0800 Gross per 24 hour  Intake 240 ml  Output 1900 ml  Net -1660 ml   Filed Weights   04/28/18 0927  Weight: 70.3 kg (155 lb)   Exam  Awake alert in nad-less confused in no distress laying flat and wants to lie down  LE swollen and grade 3 edema --Unna boots unavailable per nursing-sutures still in place over left hip  abd obese nt nd  cta b-No added sound  Neuro intact  Data Reviewed: I have personally reviewed following labs and imaging studies  CBC: Recent Labs  Lab 05/04/18 0354 05/05/18 0415 05/06/18 0433 05/07/18 0400 05/08/18 0831  WBC 14.4* 13.2* 25.9* 19.8* 8.1  NEUTROABS  --  11.6* 24.6* 18.5* 6.9  HGB 9.4* 9.2* 9.9* 9.6* 8.6*  HCT 29.8* 28.7* 32.3* 29.7* 27.1*  MCV 95.5 94.4 100.0 94.0 96.8  PLT 263 233 216 243 937   Basic Metabolic  Panel: Recent Labs  Lab 05/04/18 0354 05/05/18 0415 05/06/18 0433 05/07/18 0400 05/08/18 0831  NA 128* 133* 126* 132* 137  K 4.1 3.9 3.7 3.6 2.5*  CL 100 104 98 101 105  CO2 20* 21* 19* 22 22  GLUCOSE 143* 96 67* 85 77  BUN 58* 55* 49* 50* 44*  CREATININE 2.31* 2.23* 2.09* 2.11* 1.87*  CALCIUM 7.7* 7.8* 7.5* 7.7* 6.5*   GFR: Estimated Creatinine Clearance: 23.1 mL/min (A) (by C-G formula based on SCr of 1.87 mg/dL (H)). Liver Function Tests: Recent Labs  Lab 05/05/18 0415 05/06/18 0433 05/07/18 0400 05/08/18 0831  AST 19 20 21 16   ALT 12 11 12 9   ALKPHOS 58 77 76 58  BILITOT 0.7 0.7 0.8 0.8  PROT 4.7* 4.0* 4.4* 4.0*  ALBUMIN 2.3* 2.0* 2.1* 1.9*   No results for input(s): LIPASE, AMYLASE in the last 168 hours. No results for input(s): AMMONIA in the last 168 hours. Coagulation Profile: No results for input(s): INR, PROTIME in the last 168 hours. Cardiac Enzymes: No results for input(s): CKTOTAL, CKMB, CKMBINDEX, TROPONINI in the last 168 hours. BNP (last 3 results) No results for input(s): PROBNP in the last 8760 hours. HbA1C: No results for  input(s): HGBA1C in the last 72 hours. CBG: Recent Labs  Lab 05/07/18 0633 05/07/18 1135 05/07/18 1626 05/07/18 2128 05/08/18 0712  GLUCAP 79 96 107* 102* 93   Lipid Profile: No results for input(s): CHOL, HDL, LDLCALC, TRIG, CHOLHDL, LDLDIRECT in the last 72 hours. Thyroid Function Tests: No results for input(s): TSH, T4TOTAL, FREET4, T3FREE, THYROIDAB in the last 72 hours. Anemia Panel: No results for input(s): VITAMINB12, FOLATE, FERRITIN, TIBC, IRON, RETICCTPCT in the last 72 hours. Urine analysis:    Component Value Date/Time   COLORURINE YELLOW 04/28/2018 0925   APPEARANCEUR HAZY (A) 04/28/2018 0925   LABSPEC 1.018 04/28/2018 0925   PHURINE 5.0 04/28/2018 0925   GLUCOSEU NEGATIVE 04/28/2018 0925   HGBUR NEGATIVE 04/28/2018 0925   BILIRUBINUR NEGATIVE 04/28/2018 0925   KETONESUR 5 (A) 04/28/2018 0925    PROTEINUR 100 (A) 04/28/2018 0925   UROBILINOGEN 0.2 04/01/2012 0510   NITRITE NEGATIVE 04/28/2018 0925   LEUKOCYTESUR NEGATIVE 04/28/2018 0925   Sepsis Labs: @LABRCNTIP (procalcitonin:4,lacticidven:4)  ) Recent Results (from the past 240 hour(s))  MRSA PCR Screening     Status: None   Collection Time: 05/06/18  8:20 AM  Result Value Ref Range Status   MRSA by PCR NEGATIVE NEGATIVE Final    Comment:        The GeneXpert MRSA Assay (FDA approved for NASAL specimens only), is one component of a comprehensive MRSA colonization surveillance program. It is not intended to diagnose MRSA infection nor to guide or monitor treatment for MRSA infections. Performed at [facility], [place] [address] [place], [place] [address]   C difficile quick scan w PCR reflex     Status: None   Collection Time: 05/06/18  8:42 AM  Result Value Ref Range Status   C Diff antigen NEGATIVE NEGATIVE Final   C Diff toxin NEGATIVE NEGATIVE Final   C Diff interpretation No C. difficile detected.  Final  Urine Culture     Status: None   Collection Time: 05/06/18  8:45 AM  Result Value Ref Range Status   Specimen Description URINE, RANDOM  Final   Special Requests NONE  Final   Culture   Final    NO GROWTH Performed at [facility], [address] [place],  [address]    Report Status 05/07/2018 FINAL  Final      Radiology Studies: Dg Chest 2 View  Result Date: 05/07/2018 CLINICAL DATA:  Follow-up left retrocardiac density EXAM: CHEST - 2 VIEW COMPARISON:  05/06/2018 FINDINGS: Cardiac shadow remains enlarged. Aortic calcifications are again seen. Stable left basilar atelectasis is noted with slight increase in bilateral pleural effusions when compared with the prior exam. No bony abnormality is noted. IMPRESSION: Increase in bilateral pleural effusions. Electronically Signed   By: Inez Catalina M.D.   On: 05/07/2018 09:53     Scheduled Meds: . sodium chloride   Intravenous Once  .  acetaminophen  650 mg Oral Q6H  . atenolol  100 mg Oral QHS  . chlorhexidine  15 mL Mouth Rinse BID  . diltiazem  180 mg Oral QPM  . dipyridamole-aspirin  1 capsule Oral BID  . furosemide  60 mg Intravenous Q12H  . hydrocortisone  15 mg Oral Daily   And  . hydrocortisone  5 mg Oral QHS  . insulin aspart  0-15 Units Subcutaneous TID WC  . insulin aspart  0-5 Units Subcutaneous QHS  . levothyroxine  75 mcg Oral QAC breakfast  . mouth rinse  15  mL Mouth Rinse q12n4p  . pantoprazole  40 mg Oral Daily  . potassium chloride  40 mEq Oral BID  . sodium chloride flush  10-40 mL Intracatheter Q12H   Continuous Infusions:    LOS: 10 days   Time Spent in minutes   45 minutes  Verneita Griffes, MD [facility] (P) [phone]

## 2018-05-08 NOTE — Progress Notes (Signed)
Ortho Tech paged yesterday and today regarding UNNA boot placement. Materials not available at this time from [facility].

## 2018-05-09 LAB — COMPREHENSIVE METABOLIC PANEL
ALT: 10 U/L (ref 0–44)
ANION GAP: 12 (ref 5–15)
AST: 17 U/L (ref 15–41)
Albumin: 2.6 g/dL — ABNORMAL LOW (ref 3.5–5.0)
Alkaline Phosphatase: 76 U/L (ref 38–126)
BUN: 41 mg/dL — ABNORMAL HIGH (ref 8–23)
CHLORIDE: 102 mmol/L (ref 98–111)
CO2: 28 mmol/L (ref 22–32)
Calcium: 7.9 mg/dL — ABNORMAL LOW (ref 8.9–10.3)
Creatinine, Ser: 1.9 mg/dL — ABNORMAL HIGH (ref 0.44–1.00)
GFR calc non Af Amer: 24 mL/min — ABNORMAL LOW (ref >60)
GFR, EST AFRICAN AMERICAN: 28 mL/min — AB (ref >60)
Glucose, Bld: 110 mg/dL — ABNORMAL HIGH (ref 70–99)
POTASSIUM: 3.2 mmol/L — AB (ref 3.5–5.1)
Sodium: 142 mmol/L (ref 135–145)
TOTAL PROTEIN: 5.1 g/dL — AB (ref 6.5–8.1)
Total Bilirubin: 1.4 mg/dL — ABNORMAL HIGH (ref 0.3–1.2)

## 2018-05-09 LAB — CBC WITH DIFFERENTIAL/PLATELET
Abs Immature Granulocytes: 0.1 10*3/uL (ref 0.0–0.1)
BASOS ABS: 0 10*3/uL (ref 0.0–0.1)
Basophils Relative: 0 %
EOS ABS: 0.1 10*3/uL (ref 0.0–0.7)
Eosinophils Relative: 1 %
HEMATOCRIT: 33.5 % — AB (ref 36.0–46.0)
HEMOGLOBIN: 10.6 g/dL — AB (ref 12.0–15.0)
IMMATURE GRANULOCYTES: 1 %
LYMPHS ABS: 0.7 10*3/uL (ref 0.7–4.0)
LYMPHS PCT: 7 %
MCH: 30.3 pg (ref 26.0–34.0)
MCHC: 31.6 g/dL (ref 30.0–36.0)
MCV: 95.7 fL (ref 78.0–100.0)
Monocytes Absolute: 0.8 10*3/uL (ref 0.1–1.0)
Monocytes Relative: 7 %
NEUTROS PCT: 84 %
Neutro Abs: 9.1 10*3/uL — ABNORMAL HIGH (ref 1.7–7.7)
Platelets: 221 10*3/uL (ref 150–400)
RBC: 3.5 MIL/uL — ABNORMAL LOW (ref 3.87–5.11)
RDW: 16.5 % — AB (ref 11.5–15.5)
WBC: 10.9 10*3/uL — ABNORMAL HIGH (ref 4.0–10.5)

## 2018-05-09 LAB — GLUCOSE, CAPILLARY
GLUCOSE-CAPILLARY: 114 mg/dL — AB (ref 70–99)
Glucose-Capillary: 110 mg/dL — ABNORMAL HIGH (ref 70–99)
Glucose-Capillary: 148 mg/dL — ABNORMAL HIGH (ref 70–99)
Glucose-Capillary: 130 mg/dL — ABNORMAL HIGH (ref 70–99)

## 2018-05-09 LAB — MAGNESIUM: MAGNESIUM: 2.2 mg/dL (ref 1.7–2.4)

## 2018-05-09 MED ORDER — BUMETANIDE 1 MG PO TABS
1.0000 mg | ORAL_TABLET | Freq: Every evening | ORAL | Status: DC
  Administered 2018-05-09: 1 mg via ORAL
  Filled 2018-05-09 (×2): qty 1

## 2018-05-09 MED ORDER — BUMETANIDE 2 MG PO TABS
2.0000 mg | ORAL_TABLET | Freq: Every morning | ORAL | Status: DC
  Administered 2018-05-09 – 2018-05-10 (×2): 2 mg via ORAL
  Filled 2018-05-09 (×2): qty 1

## 2018-05-09 MED ORDER — HALOPERIDOL LACTATE 5 MG/ML IJ SOLN
1.0000 mg | Freq: Four times a day (QID) | INTRAMUSCULAR | Status: AC | PRN
  Administered 2018-05-09 – 2018-05-10 (×2): 2 mg via INTRAVENOUS
  Filled 2018-05-09 (×2): qty 1

## 2018-05-09 NOTE — Progress Notes (Signed)
Orthopedic Tech Progress Note Patient Details:  Katie Pace 30-Jul-1936 [id number]  Patient ID: [name], female   DOB: 1936/03/13, 82 y.o.   MRN: [id number]   [place] Pink 05/09/2018, 7:28 AMOut of unna boot wrap.

## 2018-05-09 NOTE — Progress Notes (Signed)
Clinical Social Worker following patient for support and discharge needs. CSW is assisting with SNF placement once medically cleared.   Rhea Pink, MSW,  [place]

## 2018-05-09 NOTE — Progress Notes (Signed)
PROGRESS NOTE    Katie Pace  MEQ:[id number] DOB: [birth year] DOA: 04/28/2018 PCP: [name], MD   Brief Narrative:   82 y.o. F  left hip hemiarthroplasty on 7/20 by Dr. 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   Husband at bedside states that when he went to visit her today at the skilled nursing facility he found her slumped - evaluated by staff at the SNF she was noted to have a temperature of 101 and was confused and hence was sent to the hospital for evaluation.   ED WORK-UP= TMAX 101, hemodynamically stable creatinine of 1.9 which is close to her baseline,  WBCs at 11.4, normal lactic acid of 0.80, she was  mildly tachypneic and tachycardic.    She was found to have significant edema and erythema surrounding her incision of her left hip and extending downwards to the knee.    EDP consulted with Dr. Erlinda Hong, who performed her recent hip surgery who thought that patient could be treated with IV antibiotics at [facility], however I believe it is more reasonable for her to be transferred to [facility] for IV antibiotic therapy, ID consultation and to be formally assessed by her orthopedic surgeon given her recent surgery.    Assessment & Plan   Iatrogenic fluid overload and hypoxic respiratory failure 8/1 Events noted-transfer to stepdown 8/2 am-diuresed-now off BiPAP-nasal cannula-Foley placed-strict I/O-Lasix increased to 60 bid-I/o= currently -10.6 L Much improved Discontinue CVP checks and change to Bumex 2 in the morning 1 in the evening and monitor renal function  Hypokalemia Potassium 3.2 Replacing with p.o. K. Dur 40 twice daily Mag 2.2  Left lower extremity erythema and edema- cellulitis -no sepsis criteria on admission-fever however with no  other significant vital sign changes and no leukocytosis. -Status post left hip arthroplasty on 04/23/2018 by Dr. Erlinda Hong -Blood cultures show no growth to date -Was given vancomycin, aztreonam and Levaquin given penicillin allergY AND EVENTUALLY ALL AX D/C 7/31 -Orthopedics, Dr.Xu, consulted and appreciated.  Feels is due to dependent edema 2/2 sedentary for the past week-no infection -PT and OT consulted- recommended SNF -SW consulted -edema and erythema improving --UNNA boots on, WOC nurse appreciated--wounds have significantly improved -narrowed off of Abx on 7/31 and rpt CBc in am and monitor fever curve -Leukocytosis has completely resolved on 8/4 so I suspect an element of aspiration  Met encephalopathy ? From AKI vs steroid pyschosis- creat to ~ 2.1 now---> 1.87 -holding opiates-scheduled tylenol Change to home Cortef 15 am, 5 pm  Wheezing -Patient denies shortness of breath, and wheezing has improved -cxr 7/30 unclear for PNA---last x-ray 8/3 more suggestive of fluid and patient clinically improving with diuresis -Leukocytosis points to possible aspiration but patient was also on steroids so it is unclear which is which and we will never have complete clarity although she is improved -continue neb treatments PRN, incentive spirometry and flutter valve, and nasal saline -solucortef home dosing  Diabetes mellitus, type II -Hemoglobin A1c 6 -Continue insulin sliding scale with CBG 110-114 -not on any medications at home -Discontinue blood sugar checks  Panhypopituitarism -Continue Synthroid and hydrocortisone -Patient had Sheehan syndrome back in the 1970s  Chronic kidney disease, stage IV Hypervolemic hyponatremia with contraction alkalosis Edema 2/2 malunutrition and low albumin -Baseline creatinine approximately 1.7-2.1 Aim to keep  dry see above discussion Cont diuretics  ACute on Chronic normocytic anemia -Hemoglobin slightly lower, currently 7.7 -1 U PRBC 7/30  transfused -Anemia panel shows adequate iron and iron stores, very elevated B12 at 2060 -FOBT pending still -SCD -Abd US shows no hematoma from 7/30  DVT Prophylaxis  lovenox discontinued, placed on on SCDs  Code Status: Full  Family Communication: Family at bedside, d/w son and daughter-in-law in detail 8/4-  Disposition Plan: Admitted--- currently on stepdown unit--not ready for discharge Consultants Orthopedic surgery  Procedures  None  Antibiotics     Subjective:   Doing well No fever no chills More coherent and less confused  Objective:   Vitals:   05/09/18 0815 05/09/18 0900 05/09/18 0925 05/09/18 1407  BP: (!) 188/66  (!) 171/59 (!) 170/68  Pulse: 63 66 64 66  Resp: 19 20 18 18   Temp:      TempSrc:      SpO2: 96% 96% 95% 97%  Weight:      Height:        Intake/Output Summary (Last 24 hours) at 05/09/2018 1516 Last data filed at 05/09/2018 1400 Gross per 24 hour  Intake 723 ml  Output 5825 ml  Net -5102 ml   Filed Weights   04/28/18 0927 05/09/18 0515  Weight: 70.3 kg (155 lb) 76.6 kg (168 lb 14 oz)   Exam  Awake alert in nad-less confused in no distress l  LE swollen and grade 3 edema --Unna boots unavailable per nursing-sutures still in place over left hip  abd obese nt nd  cta b-No added sound  Neuro intact  Data Reviewed: I have personally reviewed following labs and imaging studies  CBC: Recent Labs  Lab 05/05/18 0415 05/06/18 0433 05/07/18 0400 05/08/18 0831 05/09/18 0544  WBC 13.2* 25.9* 19.8* 8.1 10.9*  NEUTROABS 11.6* 24.6* 18.5* 6.9 9.1*  HGB 9.2* 9.9* 9.6* 8.6* 10.6*  HCT 28.7* 32.3* 29.7* 27.1* 33.5*  MCV 94.4 100.0 94.0 96.8 95.7  PLT 233 216 243 177 767   Basic Metabolic Panel: Recent Labs  Lab 05/05/18 0415 05/06/18 0433 05/07/18 0400 05/08/18 0831 05/09/18 0544  NA 133* 126* 132* 137 142  K 3.9 3.7 3.6 2.5* 3.2*  CL 104 98 101 105 102  CO2 21* 19* 22 22 28   GLUCOSE 96 67* 85 77 110*  BUN 55* 49* 50* 44*  41*  CREATININE 2.23* 2.09* 2.11* 1.87* 1.90*  CALCIUM 7.8* 7.5* 7.7* 6.5* 7.9*  MG  --   --   --  2.1 2.2   GFR: Estimated Creatinine Clearance: 23.7 mL/min (A) (by C-G formula based on SCr of 1.9 mg/dL (H)). Liver Function Tests: Recent Labs  Lab 05/05/18 0415 05/06/18 0433 05/07/18 0400 05/08/18 0831 05/09/18 0544  AST 19 20 21 16 17   ALT 12 11 12 9 10   ALKPHOS 58 77 76 58 76  BILITOT 0.7 0.7 0.8 0.8 1.4*  PROT 4.7* 4.0* 4.4* 4.0* 5.1*  ALBUMIN 2.3* 2.0* 2.1* 1.9* 2.6*   No results for input(s): LIPASE, AMYLASE in the last 168 hours. No results for input(s): AMMONIA in the last 168 hours. Coagulation Profile: No results for input(s): INR, PROTIME in the last 168 hours. Cardiac Enzymes: No results for input(s): CKTOTAL, CKMB, CKMBINDEX, TROPONINI in the last 168 hours. BNP (last 3 results) No results for input(s): PROBNP in the last 8760 hours. HbA1C: No results for input(s): HGBA1C in the last 72 hours. CBG: Recent Labs  Lab 05/08/18 1143 05/08/18 1622 05/08/18  2212 05/09/18 0643 05/09/18 1116  GLUCAP 94 125* 105* 114* 110*   Lipid Profile: No results for input(s): CHOL, HDL, LDLCALC, TRIG, CHOLHDL, LDLDIRECT in the last 72 hours. Thyroid Function Tests: No results for input(s): TSH, T4TOTAL, FREET4, T3FREE, THYROIDAB in the last 72 hours. Anemia Panel: No results for input(s): VITAMINB12, FOLATE, FERRITIN, TIBC, IRON, RETICCTPCT in the last 72 hours. Urine analysis:    Component Value Date/Time   COLORURINE YELLOW 04/28/2018 0925   APPEARANCEUR HAZY (A) 04/28/2018 0925   LABSPEC 1.018 04/28/2018 0925   PHURINE 5.0 04/28/2018 0925   GLUCOSEU NEGATIVE 04/28/2018 0925   HGBUR NEGATIVE 04/28/2018 0925   BILIRUBINUR NEGATIVE 04/28/2018 0925   KETONESUR 5 (A) 04/28/2018 0925   PROTEINUR 100 (A) 04/28/2018 0925   UROBILINOGEN 0.2 04/01/2012 0510   NITRITE NEGATIVE 04/28/2018 0925   LEUKOCYTESUR NEGATIVE 04/28/2018 0925   Sepsis  Labs: @LABRCNTIP (procalcitonin:4,lacticidven:4)  ) Recent Results (from the past 240 hour(s))  MRSA PCR Screening     Status: None   Collection Time: 05/06/18  8:20 AM  Result Value Ref Range Status   MRSA by PCR NEGATIVE NEGATIVE Final    Comment:        The GeneXpert MRSA Assay (FDA approved for NASAL specimens only), is one component of a comprehensive MRSA colonization surveillance program. It is not intended to diagnose MRSA infection nor to guide or monitor treatment for MRSA infections. Performed at [facility], [place] [address] [place], [place] [address]   C difficile quick scan w PCR reflex     Status: None   Collection Time: 05/06/18  8:42 AM  Result Value Ref Range Status   C Diff antigen NEGATIVE NEGATIVE Final   C Diff toxin NEGATIVE NEGATIVE Final   C Diff interpretation No C. difficile detected.  Final  Urine Culture     Status: None   Collection Time: 05/06/18  8:45 AM  Result Value Ref Range Status   Specimen Description URINE, RANDOM  Final   Special Requests NONE  Final   Culture   Final    NO GROWTH Performed at [facility], [address], [place], [place] [address]    Report Status 05/07/2018 FINAL  Final      Radiology Studies: No results found.   Scheduled Meds: . sodium chloride   Intravenous Once  . acetaminophen  650 mg Oral Q6H  . atenolol  100 mg Oral QHS  . bumetanide  1 mg Oral QPM  . bumetanide  2 mg Oral q morning - 10a  . chlorhexidine  15 mL Mouth Rinse BID  . diltiazem  180 mg Oral QPM  . dipyridamole-aspirin  1 capsule Oral BID  . hydrocortisone  15 mg Oral Daily   And  . hydrocortisone  5 mg Oral QHS  . levothyroxine  75 mcg Oral QAC breakfast  . mouth rinse  15 mL Mouth Rinse q12n4p  . pantoprazole  40 mg Oral Daily  . potassium chloride  40 mEq Oral BID  . sodium chloride flush  10-40 mL Intracatheter Q12H   Continuous Infusions:    LOS: 11 days   Time Spent in minutes   45 minutes  Verneita Griffes,  MD [facility] (P) [phone]

## 2018-05-10 DIAGNOSIS — L03116 Cellulitis of left lower limb: Secondary | ICD-10-CM | POA: Diagnosis not present

## 2018-05-10 DIAGNOSIS — E871 Hypo-osmolality and hyponatremia: Secondary | ICD-10-CM | POA: Diagnosis not present

## 2018-05-10 DIAGNOSIS — S72002G Fracture of unspecified part of neck of left femur, subsequent encounter for closed fracture with delayed healing: Secondary | ICD-10-CM | POA: Diagnosis not present

## 2018-05-10 DIAGNOSIS — D500 Iron deficiency anemia secondary to blood loss (chronic): Secondary | ICD-10-CM | POA: Diagnosis not present

## 2018-05-10 DIAGNOSIS — R1312 Dysphagia, oropharyngeal phase: Secondary | ICD-10-CM | POA: Diagnosis not present

## 2018-05-10 DIAGNOSIS — R0600 Dyspnea, unspecified: Secondary | ICD-10-CM | POA: Diagnosis not present

## 2018-05-10 DIAGNOSIS — E875 Hyperkalemia: Secondary | ICD-10-CM | POA: Diagnosis not present

## 2018-05-10 DIAGNOSIS — I509 Heart failure, unspecified: Secondary | ICD-10-CM | POA: Diagnosis not present

## 2018-05-10 DIAGNOSIS — R0689 Other abnormalities of breathing: Secondary | ICD-10-CM | POA: Diagnosis not present

## 2018-05-10 DIAGNOSIS — R6521 Severe sepsis with septic shock: Secondary | ICD-10-CM | POA: Diagnosis not present

## 2018-05-10 DIAGNOSIS — R4020 Unspecified coma: Secondary | ICD-10-CM | POA: Diagnosis not present

## 2018-05-10 DIAGNOSIS — R401 Stupor: Secondary | ICD-10-CM | POA: Diagnosis not present

## 2018-05-10 DIAGNOSIS — Z515 Encounter for palliative care: Secondary | ICD-10-CM | POA: Diagnosis not present

## 2018-05-10 DIAGNOSIS — G9340 Encephalopathy, unspecified: Secondary | ICD-10-CM | POA: Diagnosis not present

## 2018-05-10 DIAGNOSIS — F0390 Unspecified dementia without behavioral disturbance: Secondary | ICD-10-CM | POA: Diagnosis not present

## 2018-05-10 DIAGNOSIS — G9341 Metabolic encephalopathy: Secondary | ICD-10-CM | POA: Diagnosis not present

## 2018-05-10 DIAGNOSIS — G92 Toxic encephalopathy: Secondary | ICD-10-CM | POA: Diagnosis not present

## 2018-05-10 DIAGNOSIS — E230 Hypopituitarism: Secondary | ICD-10-CM | POA: Diagnosis not present

## 2018-05-10 DIAGNOSIS — E039 Hypothyroidism, unspecified: Secondary | ICD-10-CM | POA: Diagnosis not present

## 2018-05-10 DIAGNOSIS — M2550 Pain in unspecified joint: Secondary | ICD-10-CM | POA: Diagnosis not present

## 2018-05-10 DIAGNOSIS — M6281 Muscle weakness (generalized): Secondary | ICD-10-CM | POA: Diagnosis not present

## 2018-05-10 DIAGNOSIS — R0902 Hypoxemia: Secondary | ICD-10-CM | POA: Diagnosis not present

## 2018-05-10 DIAGNOSIS — E1151 Type 2 diabetes mellitus with diabetic peripheral angiopathy without gangrene: Secondary | ICD-10-CM | POA: Diagnosis not present

## 2018-05-10 DIAGNOSIS — R0602 Shortness of breath: Secondary | ICD-10-CM | POA: Diagnosis not present

## 2018-05-10 DIAGNOSIS — D62 Acute posthemorrhagic anemia: Secondary | ICD-10-CM | POA: Diagnosis not present

## 2018-05-10 DIAGNOSIS — R404 Transient alteration of awareness: Secondary | ICD-10-CM | POA: Diagnosis not present

## 2018-05-10 DIAGNOSIS — N390 Urinary tract infection, site not specified: Secondary | ICD-10-CM | POA: Diagnosis not present

## 2018-05-10 DIAGNOSIS — N179 Acute kidney failure, unspecified: Secondary | ICD-10-CM | POA: Diagnosis not present

## 2018-05-10 DIAGNOSIS — A4159 Other Gram-negative sepsis: Secondary | ICD-10-CM | POA: Diagnosis not present

## 2018-05-10 DIAGNOSIS — N170 Acute kidney failure with tubular necrosis: Secondary | ICD-10-CM | POA: Diagnosis not present

## 2018-05-10 DIAGNOSIS — S72002D Fracture of unspecified part of neck of left femur, subsequent encounter for closed fracture with routine healing: Secondary | ICD-10-CM | POA: Diagnosis not present

## 2018-05-10 DIAGNOSIS — I10 Essential (primary) hypertension: Secondary | ICD-10-CM | POA: Diagnosis not present

## 2018-05-10 DIAGNOSIS — N184 Chronic kidney disease, stage 4 (severe): Secondary | ICD-10-CM | POA: Diagnosis not present

## 2018-05-10 DIAGNOSIS — Z7401 Bed confinement status: Secondary | ICD-10-CM | POA: Diagnosis not present

## 2018-05-10 DIAGNOSIS — Z7189 Other specified counseling: Secondary | ICD-10-CM | POA: Diagnosis not present

## 2018-05-10 DIAGNOSIS — R6520 Severe sepsis without septic shock: Secondary | ICD-10-CM | POA: Diagnosis not present

## 2018-05-10 DIAGNOSIS — R488 Other symbolic dysfunctions: Secondary | ICD-10-CM | POA: Diagnosis not present

## 2018-05-10 DIAGNOSIS — L0390 Cellulitis, unspecified: Secondary | ICD-10-CM | POA: Diagnosis not present

## 2018-05-10 DIAGNOSIS — R4182 Altered mental status, unspecified: Secondary | ICD-10-CM | POA: Diagnosis not present

## 2018-05-10 DIAGNOSIS — J9601 Acute respiratory failure with hypoxia: Secondary | ICD-10-CM | POA: Diagnosis not present

## 2018-05-10 DIAGNOSIS — Z96642 Presence of left artificial hip joint: Secondary | ICD-10-CM | POA: Diagnosis not present

## 2018-05-10 DIAGNOSIS — K429 Umbilical hernia without obstruction or gangrene: Secondary | ICD-10-CM | POA: Diagnosis not present

## 2018-05-10 DIAGNOSIS — N183 Chronic kidney disease, stage 3 (moderate): Secondary | ICD-10-CM | POA: Diagnosis not present

## 2018-05-10 DIAGNOSIS — I129 Hypertensive chronic kidney disease with stage 1 through stage 4 chronic kidney disease, or unspecified chronic kidney disease: Secondary | ICD-10-CM | POA: Diagnosis not present

## 2018-05-10 DIAGNOSIS — R062 Wheezing: Secondary | ICD-10-CM | POA: Diagnosis not present

## 2018-05-10 DIAGNOSIS — A419 Sepsis, unspecified organism: Secondary | ICD-10-CM | POA: Diagnosis not present

## 2018-05-10 DIAGNOSIS — E1122 Type 2 diabetes mellitus with diabetic chronic kidney disease: Secondary | ICD-10-CM | POA: Diagnosis not present

## 2018-05-10 DIAGNOSIS — R262 Difficulty in walking, not elsewhere classified: Secondary | ICD-10-CM | POA: Diagnosis not present

## 2018-05-10 LAB — RENAL FUNCTION PANEL
ALBUMIN: 2.7 g/dL — AB (ref 3.5–5.0)
ANION GAP: 11 (ref 5–15)
BUN: 30 mg/dL — ABNORMAL HIGH (ref 8–23)
CALCIUM: 8.1 mg/dL — AB (ref 8.9–10.3)
CO2: 37 mmol/L — AB (ref 22–32)
CREATININE: 1.52 mg/dL — AB (ref 0.44–1.00)
Chloride: 93 mmol/L — ABNORMAL LOW (ref 98–111)
GFR, EST AFRICAN AMERICAN: 36 mL/min — AB (ref >60)
GFR, EST NON AFRICAN AMERICAN: 31 mL/min — AB (ref >60)
Glucose, Bld: 103 mg/dL — ABNORMAL HIGH (ref 70–99)
PHOSPHORUS: 2 mg/dL — AB (ref 2.5–4.6)
Potassium: 3.1 mmol/L — ABNORMAL LOW (ref 3.5–5.1)
SODIUM: 141 mmol/L (ref 135–145)

## 2018-05-10 LAB — GLUCOSE, CAPILLARY
Glucose-Capillary: 90 mg/dL (ref 70–99)
Glucose-Capillary: 89 mg/dL (ref 70–99)

## 2018-05-10 LAB — MAGNESIUM: Magnesium: 1.9 mg/dL (ref 1.7–2.4)

## 2018-05-10 MED ORDER — POTASSIUM CHLORIDE CRYS ER 20 MEQ PO TBCR
40.0000 meq | EXTENDED_RELEASE_TABLET | Freq: Three times a day (TID) | ORAL | Status: DC
  Administered 2018-05-10: 40 meq via ORAL
  Filled 2018-05-10: qty 2

## 2018-05-10 MED ORDER — POTASSIUM CHLORIDE CRYS ER 20 MEQ PO TBCR: 40.0000 meq | EXTENDED_RELEASE_TABLET | Freq: Three times a day (TID) | ORAL | Status: DC

## 2018-05-10 MED ORDER — ENOXAPARIN SODIUM 30 MG/0.3ML ~~LOC~~ SOLN: 30.0000 mg | Freq: Every day | SUBCUTANEOUS | 0 refills | Status: DC

## 2018-05-10 MED ORDER — DILTIAZEM HCL ER COATED BEADS 180 MG PO CP24: 180.0000 mg | ORAL_CAPSULE | Freq: Every evening | ORAL | Status: DC

## 2018-05-10 MED ORDER — BUMETANIDE 2 MG PO TABS: 2.0000 mg | ORAL_TABLET | Freq: Every morning | ORAL | Status: DC

## 2018-05-10 MED ORDER — BUMETANIDE 1 MG PO TABS: 1.0000 mg | ORAL_TABLET | Freq: Every evening | ORAL | Status: DC

## 2018-05-10 MED ORDER — ACETAMINOPHEN 325 MG PO TABS: 650.0000 mg | ORAL_TABLET | Freq: Four times a day (QID) | ORAL | Status: DC

## 2018-05-10 NOTE — Progress Notes (Signed)
Occupational Therapy Treatment Patient Details Name: Katie Pace MRN: [id number] DOB: October 18, 1935 Today's Date: 05/10/2018    History of present illness Pt is an 82 y/o female admitted 04/28/18 from SNF due to fever and decr arousal dx with cellulits.  Pt also found to have left base atelectasis vs pneumonia on CXR.  Pt with recent admission on 04/22/18 s/p fall resulting in Lt hip fracture and underwent surgery on 04/23/18 for anterior approach bipolar prosthesis WBAT post op.  PMH-dementia, HTN,  DM, CKD III, Rt TKR, Rt THR, AAA repair.   OT comments    Follow Up Recommendations  SNF;Supervision/Assistance - 24 hour    Equipment Recommendations  3 in 1 bedside commode    Recommendations for Other Services      Precautions / Restrictions Precautions Precautions: Fall;Other (comment) Precaution Comments: incontinent of urine Restrictions Weight Bearing Restrictions: Yes LLE Weight Bearing: Weight bearing as tolerated       Mobility Bed Mobility Overal bed mobility: Needs Assistance Bed Mobility: Sit to Supine;Supine to Sit     Supine to sit: Max assist;+2 for physical assistance Sit to supine: Max assist;+2 for physical assistance   General bed mobility comments: pt able to initiate LE movement, however required max A for trunk management and scooting.  Transfers Overall transfer level: Needs assistance Equipment used: (+2 with gait belt and bed pad) Transfers: Sit to/from Stand Sit to Stand: +2 physical assistance;Max assist         General transfer comment: sit <> stand attempted 2x. Pt resistive to movement and stating "I'm not up for it". Unable to elevate hips off EOB.    Balance Overall balance assessment: Needs assistance Sitting-balance support: Feet supported Sitting balance-[name]: Fair Sitting balance - Comments: pt able to sit EOB for ~15 min with intermittant UE support. Occasional sway noted, but no assist required to correct. Postural  control: Posterior lean Standing balance support: Bilateral upper extremity supported Standing balance-[name]: Zero Standing balance comment: two person max assist .                            ADL either performed or assessed with clinical judgement   ADL Overall ADL's : Needs assistance/impaired Eating/Feeding: Sitting;Minimal assistance;Moderate assistance   Grooming: Moderate assistance;Sitting                                                 Cognition Arousal/Alertness: Awake/alert Behavior During Therapy: Agitated Overall Cognitive Status: Impaired/Different from baseline Area of Impairment: Orientation;Attention;Memory;Following commands;Safety/judgement;Awareness;Problem solving                 Orientation Level: Disoriented to;Time;Situation;Place Current Attention Level: Sustained Memory: Decreased recall of precautions;Decreased short-term memory Following Commands: Follows one step commands inconsistently Safety/Judgement: Decreased awareness of safety;Decreased awareness of deficits Awareness: Intellectual Problem Solving: Slow processing;Decreased initiation;Difficulty sequencing;Requires verbal cues;Requires tactile cues General Comments: pt asleep on arrival to room and difficult to rouse. Once awake able to respond to questions, but aggitated and repeating "I'm not up for it" when attempting transfer.        Exercises     Shoulder Instructions       General Comments Husband present and reported pt wanted to get up this AM    Pertinent Vitals/ Pain       Pain Assessment:  Faces Faces Pain Scale: Hurts even more Pain Location: painful with movement Pain Descriptors / Indicators: Grimacing(pt yells with movement) Pain Intervention(s): Monitored during session;Limited activity within patient's tolerance     Prior Functioning/Environment              Frequency  Min 2X/week        Progress Toward  Goals  OT Goals(current goals can now be found in the care plan section)  Progress towards OT goals: OT to reassess next treatment  Acute Rehab OT Goals Patient Stated Goal: to get stronger  Plan      Co-evaluation    PT/OT/SLP Co-Evaluation/Treatment: Yes Reason for Co-Treatment: Complexity of the patient's impairments (multi-system involvement) PT goals addressed during session: Mobility/safety with mobility OT goals addressed during session: ADL's and self-care      AM-PAC PT "6 Clicks" Daily Activity     Outcome Measure   Help from another person eating meals?: A Little Help from another person taking care of personal grooming?: A Lot Help from another person toileting, which includes using toliet, bedpan, or urinal?: Total Help from another person bathing (including washing, rinsing, drying)?: Total Help from another person to put on and taking off regular upper body clothing?: A Lot Help from another person to put on and taking off regular lower body clothing?: Total 6 Click Score: 10    End of Session Equipment Utilized During Treatment: Gait belt;Oxygen  OT Visit Diagnosis: Unsteadiness on feet (R26.81);Muscle weakness (generalized) (M62.81);History of falling (Z91.81);Repeated falls (R29.6);Pain Pain - Right/Left: Left Pain - part of body: Hip   Activity Tolerance Patient limited by fatigue   Patient Left with call bell/phone within reach;with family/visitor present;in bed;with bed alarm set   Nurse Communication Mobility status;Other (comment);Need for lift equipment(pt needs)        Time: 1036-1101 OT Time Calculation (min): 25 min  Charges: OT General Charges $OT Visit: 1 Visit OT Treatments $Self Care/Home Management : 8-22 mins  [place], [place]   Betsy Pries 05/10/2018, 3:06 PM

## 2018-05-10 NOTE — Discharge Summary (Signed)
Physician Discharge Summary  Katie Pace KDT:[id number] DOB: 1936/07/13 DOA: 04/28/2018  PCP: [name], MD  Admit date: 04/28/2018 Discharge date: 05/10/2018  Time spent: 45 minutes  Recommendations for Outpatient Follow-up:  1. Medication of Bumex is new-please dose carefully and check labs in about 3 days but was slightly hypokalemic on discharge at 3.1-also note dose adjustment downwards of Cardizem may need to be re-titrated if develops tachycardic 2. Lovenox should stop on 8/13 completing 20 days of DVT prophylaxis for prior hip surgery 3. Recommend discontinuation of various medications inclusive of those listed on MAR in particular would not use any sleep aids, would avoid opiates if possible and use Toradol or high-dose ibuprofen in addition to scheduled Tylenol for hip pain 4. Recommend goals of care discussion as an outpatient if she does not improve again-would avoid polypharmacy and continue redirection 5. Continue Unna boot to both lower extremities and redress every week 6. Please arrange for the patient to be followed by Dr. Beatriz Chancellor of [facility] for removal of sutures 7. Please monitor this patient closely for aspiration and suggest speech therapy input as an outpatient 8. Outpatient monitoring of sugars she was on sliding scale insulin but her A1c was only 6 so she was not discharged on any meds  Discharge Diagnoses:  Principal Problem:   Cellulitis of left lower extremity Active Problems:   Diabetes mellitus (HCC)   Sepsis (HCC)   CKD (chronic kidney disease) stage 4, GFR 15-29 ml/min (HCC)   Panhypopituitarism (HCC)   Pressure injury of skin   Discharge Condition: Improved  Diet recommendation: Heart healthy  Filed Weights   04/28/18 0927 05/09/18 0515  Weight: 70.3 kg (155 lb) 76.6 kg (168 lb 14 oz)    History of present illness:  82 y.o. 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  Husband at bedside states that when he went to visit her today at the skilled nursing facility he found her slumped - evaluated by staff at the SNF she was noted to have a temperature of 101 and was confused and hence was sent to the hospital for evaluation.   ED WORK-UP= TMAX 101, hemodynamically stable creatinine of 1.9 which is close to her baseline,  WBCs at 11.4, normal lactic acid of 0.80, she was mildly tachypneic and tachycardic.   She was found to have significant edema and erythema surrounding her incision of her left hip and extending downwards to the knee.   EDP consulted with Dr. Carren Rang performed her recent hip surgery who thought that patient could be treated with IV antibiotics at [facility], however I believe it is more reasonable for her to be transferred to [facility] for IV antibiotic therapy, ID consultation and to be formally assessed by her orthopedic surgeon given her recent surgery.     Hospital Course:   Iatrogenic fluid overload and hypoxic respiratory failure 8/1 Events noted-transfer to stepdown 8/2 am-diuresed-now off BiPAP-nasal cannula-Foley placed-strict I/O-Lasix increased to 60 bid-I/o= currently -10.6 L Much improved Discontinue CVP checks and change to Bumex 2 in the morning 1 in the evening and monitor renal function  Hypokalemia Potassium 3.2 Replacing with p.o. K. Dur 40 twice daily Mag 2.2  Left lower extremity erythema and edema- cellulitis -no sepsis criteria on  admission-fever however with no other significant vital sign changes and no leukocytosis. -Status post left hip arthroplasty on 04/23/2018 by Dr. Erlinda Hong -Blood cultures show no growth to date -Was given vancomycin, aztreonam and Levaquin given penicillin allergY AND EVENTUALLY  ALL AX D/C 7/31 -Orthopedics, Dr.Xu, consulted and appreciated.  Feels is due to dependent edema 2/2 sedentary for the past week-no infection -PT and OT consulted- recommended SNF -SW consulted -edema and erythema improving --UNNA boots on, WOC nurse appreciated--wounds have significantly improved -Leukocytosis has completely resolved on 8/4 so I suspect an element of aspiration and would monitor for aspiration with precautions  Met encephalopathy ? From AKI vs steroid pyschosis- creat to ~ 2.1 now---> 1.87 -holding opiates-scheduled tylenol Change to home Cortef 15 am, 5 pm  Wheezing -Patient denies shortness of breath, and wheezing has improved -cxr 7/30 unclear for PNA---last x-ray 8/3 more suggestive of fluid and patient clinically improving with diuresis -Leukocytosis points to possible aspiration but patient was also on steroids so it is unclear which is which and we will never have complete clarity although she is improved -continue neb treatments PRN, incentive spirometry and flutter valve, and nasal saline -solucortef home dosing  Diabetes mellitus, type II -Hemoglobin A1c 6 -not on any medications at home -Discontinue blood sugar checks and consider outpatient low-dose metformin glipizide if she improved  Panhypopituitarism -Continue Synthroid and hydrocortisone -Patient had Sheehan syndrome back in the 1970s  Chronic kidney disease, stage IV Hypervolemic hyponatremia with contraction alkalosis Edema 2/2 malunutrition and low albumin -Baseline creatinine approximately 1.7-2.1 Aim to keep dry see above discussion Cont diuretics  ACute on Chronic normocytic anemia -Hemoglobin slightly lower, currently 7.7 -1 U PRBC 7/30 transfused -Anemia panel shows adequate iron and iron stores, very elevated B12 at 2060 -FOBT pending still -SCD -Abd US shows no hematoma from 7/30    Procedures: Multiple Consultations:  Orthopedics  Discharge Exam: Vitals:    05/10/18 0000 05/10/18 0500  BP:  (!) 168/69  Pulse: (!) 59 (!) 53  Resp: 14 14  Temp:    SpO2: 97% 96%    General: Awake alert slightly confused but better than prior-has underlying dementia and is able to recognize some of her family members but cannot orient completely Cardiovascular: S1-S2 no murmur telemetry is benign Respiratory: Chest is clear Diminished soft wounds examined prior and were normal  Discharge Instructions   Discharge Instructions    Diet - low sodium heart healthy   Complete by:  As directed    Increase activity slowly   Complete by:  As directed      Allergies as of 05/10/2018      Reactions   Codeine Nausea Only   Dilaudid [hydromorphone Hcl] Nausea Only   Lincomycin Hcl Other (See Comments)   Big bumps bilateral legs   Macrodantin Hives   Zolpidem Tartrate Other (See Comments)   Stroke   Azithromycin Rash   Penicillins Rash   Has patient had a PCN reaction causing immediate rash, facial/tongue/throat swelling, SOB or lightheadedness with hypotension: Yes Has patient had a PCN reaction causing severe rash involving mucus membranes or skin necrosis: No Has patient had a PCN reaction that required hospitalization: No Has patient had a PCN reaction occurring within the last 10 years: No If all of the above answers are "NO", then may proceed with Cephalosporin use.      Medication List    STOP taking these medications   amitriptyline 25 MG tablet Commonly known as:  ELAVIL  cephALEXin 500 MG capsule Commonly known as:  KEFLEX   glucose monitoring kit monitoring kit   HYDROcodone-acetaminophen 10-325 MG tablet Commonly known as:  NORCO   potassium citrate 10 MEQ (1080 MG) SR tablet Commonly known as:  UROCIT-K   rosuvastatin 5 MG tablet Commonly known as:  CRESTOR     TAKE these medications   acetaminophen 325 MG tablet Commonly known as:  TYLENOL Take 2 tablets (650 mg total) by mouth every 6 (six) hours. What changed:     medication strength  how much to take  when to take this  reasons to take this   atenolol 100 MG tablet Commonly known as:  TENORMIN Take 100 mg by mouth at bedtime.   bumetanide 2 MG tablet Commonly known as:  BUMEX Take 1 tablet (2 mg total) by mouth every morning.   bumetanide 1 MG tablet Commonly known as:  BUMEX Take 1 tablet (1 mg total) by mouth every evening.   CALCIUM-CARB 600 PO Take 1 tablet by mouth daily.   diltiazem 180 MG 24 hr capsule Commonly known as:  CARDIZEM CD Take 1 capsule (180 mg total) by mouth every evening. What changed:    medication strength  how much to take   dipyridamole-aspirin 200-25 MG 12hr capsule Commonly known as:  AGGRENOX Take 1 capsule by mouth 2 (two) times daily.   enoxaparin 30 MG/0.3ML injection Commonly known as:  LOVENOX Inject 0.3 mLs (30 mg total) into the skin daily for 20 days. End date 8/13 What changed:  additional instructions   FISH OIL PO Take 1 capsule by mouth daily.   hydrALAZINE 50 MG tablet Commonly known as:  APRESOLINE Take 1 tablet (50 mg total) by mouth every 8 (eight) hours.   hydrocortisone 5 MG tablet Commonly known as:  CORTEF Take 5-15 mg by mouth at bedtime. Take 3 tablets (15 mg) by mouth every morning and 1 tablet (5 mg) at bedtime   levothyroxine 75 MCG tablet Commonly known as:  SYNTHROID, LEVOTHROID Take 75 mcg by mouth daily before breakfast.   memantine 10 MG tablet Commonly known as:  NAMENDA Take 10 mg by mouth 2 (two) times daily.   multivitamin with minerals Tabs tablet Take 1 tablet by mouth daily.   omeprazole 40 MG capsule Commonly known as:  PRILOSEC Take 40 mg by mouth 2 (two) times daily.   potassium chloride SA 20 MEQ tablet Commonly known as:  K-DUR,KLOR-CON Take 2 tablets (40 mEq total) by mouth 3 (three) times daily.      Allergies  Allergen Reactions  . Codeine Nausea Only  . Dilaudid [Hydromorphone Hcl] Nausea Only  . Lincomycin Hcl Other (See  Comments)    Big bumps bilateral legs  . Macrodantin Hives  . Zolpidem Tartrate Other (See Comments)    Stroke  . Azithromycin Rash  . Penicillins Rash    Has patient had a PCN reaction causing immediate rash, facial/tongue/throat swelling, SOB or lightheadedness with hypotension: Yes Has patient had a PCN reaction causing severe rash involving mucus membranes or skin necrosis: No Has patient had a PCN reaction that required hospitalization: No Has patient had a PCN reaction occurring within the last 10 years: No If all of the above answers are "NO", then may proceed with Cephalosporin use.      The results of significant diagnostics from this hospitalization (including imaging, microbiology, ancillary and laboratory) are listed below for reference.    Significant Diagnostic Studies: Dg Chest 2 View  Result  Date: 05/07/2018 CLINICAL DATA:  Follow-up left retrocardiac density EXAM: CHEST - 2 VIEW COMPARISON:  05/06/2018 FINDINGS: Cardiac shadow remains enlarged. Aortic calcifications are again seen. Stable left basilar atelectasis is noted with slight increase in bilateral pleural effusions when compared with the prior exam. No bony abnormality is noted. IMPRESSION: Increase in bilateral pleural effusions. Electronically Signed   By: Inez Catalina M.D.   On: 05/07/2018 09:53   Dg Ribs Unilateral W/chest Left  Result Date: 04/23/2018 CLINICAL DATA:  Left axillary pain after fall. EXAM: LEFT RIBS AND CHEST - 3+ VIEW COMPARISON:  On 04/23/2013 FINDINGS: Remote left posterolateral sixth rib fracture. Cardiomegaly with moderate to marked aortic atherosclerosis with uncoiling of the thoracic aorta. No aneurysm. Atelectasis is identified at the left lung base. No pneumothorax or pulmonary contusions. Lumbar spinal fusion L3 through L5 with interbody blocks in place. Chronic L2 compression deformity with moderate 50% height loss. Inferior endplate compression of T8 with 50% height loss is also noted.  These appear chronic and stable. IMPRESSION: 1. Remote left sixth rib fracture. No acute displaced rib fracture is currently noted. 2. Cardiomegaly with aortic atherosclerosis. 3. Thoracolumbar spondylosis with lower lumbar spinal fusion and chronic compression fractures of T8 and L2. Electronically Signed   By: Ashley Royalty M.D.   On: 04/23/2018 00:21   Ct Head Wo Contrast  Result Date: 04/24/2018 CLINICAL DATA:  Altered mental status, baseline dementia. Recent hip fracture with repair. EXAM: CT HEAD WITHOUT CONTRAST TECHNIQUE: Contiguous axial images were obtained from the base of the skull through the vertex without intravenous contrast. COMPARISON:  CT head 04/01/2012.  MR head 12/08/2011. FINDINGS: The patient was unable to remain motionless for the exam. Small or subtle lesions could be overlooked. Brain: No evidence for acute infarction, hemorrhage, mass lesion, hydrocephalus, or extra-axial fluid. Advanced atrophy. Extensive chronic microvascular ischemic change. Chronic LEFT occipital infarct. Vascular: Calcification of the cavernous internal carotid arteries consistent with cerebrovascular atherosclerotic disease. No signs of intracranial large vessel occlusion. Skull: No fracture or osseous lesion. Sinuses/Orbits: No significant opacity or layering fluid. Negative orbits. Other: LEFT carotid stent. IMPRESSION: Motion degraded exam demonstrating advanced atrophy and extensive small vessel disease. Old LEFT hemisphere infarct, evidence for previous LEFT carotid surgery. No acute intracranial findings. Electronically Signed   By: Staci Righter M.D.   On: 04/24/2018 13:24   Pelvis Portable  Result Date: 04/23/2018 CLINICAL DATA:  Anterior left hip hemiarthroplasty. EXAM: PORTABLE PELVIS 1-2 VIEWS COMPARISON:  04/22/2018 FINDINGS: Right hip arthroplasty unchanged. Interval placement of left hip arthroplasty intact and normally located. Mild diffuse osteopenia. Remainder of the exam is unchanged.  IMPRESSION: Interval left hip arthroplasty intact. Electronically Signed   By: Marin Olp M.D.   On: 04/23/2018 16:27   US Abdomen Limited  Result Date: 05/03/2018 CLINICAL DATA:  Right and left lower quadrant abdominal wall masses, redness and swelling, question hematoma. Left hip arthroplasty 10 days ago. EXAM: ULTRASOUND ABDOMEN LIMITED COMPARISON:  None. FINDINGS: Targeted sonographic evaluation in the area of the right lower quadrant abdominal wall in the area of redness demonstrates subcutaneous edema without dominant focal fluid collection or mass. Targeted sonographic evaluation in the area of the left lower quadrant abdominal wall in the area of redness demonstrates subcutaneous edema without focal fluid collection or mass. Incidental note of urinary bladder distention with bladder volume of 1048.0 cm^3. IMPRESSION: 1. Edema in the right and left lower quadrant anterior abdominal wall without focal fluid collection or mass. No confluent hematoma. 2. Distended urinary  bladder with bladder volume of 1048.0 cm^3. Electronically Signed   By: Jeb Levering M.D.   On: 05/03/2018 22:19   Dg Chest Port 1 View  Result Date: 05/06/2018 CLINICAL DATA:  Shortness of breath. EXAM: PORTABLE CHEST 1 VIEW COMPARISON:  Chest radiograph May 05, 2018 FINDINGS: Stable cardiomegaly. Tortuous calcified aorta. Pulmonary vascular congestion and interstitial prominence. Retrocardiac consolidation. Small to moderate bilateral pleural effusions. No pneumothorax. RIGHT PICC in situ. Soft tissue planes and included osseous structures are unchanged. IMPRESSION: Stable cardiomegaly and pulmonary edema. Persistent retrocardiac consolidation with small to moderate pleural effusions. Increasingly elevated RIGHT hemidiaphragm compatible with underlying atelectasis. Aortic Atherosclerosis (ICD10-I70.0). Electronically Signed   By: Elon Alas M.D.   On: 05/06/2018 03:31   Dg Chest Port 1 View  Result Date:  05/05/2018 CLINICAL DATA:  Difficulty breathing EXAM: PORTABLE CHEST 1 VIEW COMPARISON:  05/03/2018, 04/30/2018, 04/28/2018 FINDINGS: Right upper extremity catheter tip overlies the SVC. Cardiomegaly with vascular congestion and mild pulmonary edema. Small pleural effusions and increasing bibasilar airspace disease. Aortic atherosclerosis. No pneumothorax. IMPRESSION: 1. Cardiomegaly with vascular congestion and mild edema 2. Small pleural effusions with increasing left greater than right bibasilar airspace disease, atelectasis versus pneumonia Electronically Signed   By: Donavan Foil M.D.   On: 05/05/2018 19:44   Dg Chest Port 1 View  Result Date: 05/03/2018 CLINICAL DATA:  Shortness of breath. EXAM: PORTABLE CHEST 1 VIEW COMPARISON:  Radiograph of April 30, 2018. FINDINGS: Stable cardiomegaly. Atherosclerosis of thoracic aorta is noted. No pneumothorax is noted. Right-sided PICC line is unchanged in position. Mild bibasilar atelectasis is noted with minimal pleural effusions. Bony thorax is unremarkable. IMPRESSION: Mild bibasilar subsegmental atelectasis with minimal pleural effusions. Aortic Atherosclerosis (ICD10-I70.0). Electronically Signed   By: Marijo Conception, M.D.   On: 05/03/2018 16:16   Dg Chest Port 1 View  Result Date: 04/30/2018 CLINICAL DATA:  Short of breath wheezing EXAM: PORTABLE CHEST 1 VIEW COMPARISON:  04/28/2018, 04/22/2018 FINDINGS: Right upper extremity catheter tip overlies the SVC. Cardiomegaly with small pleural effusions. Vascular congestion. Atelectasis or infiltrate at the left base, increased compared to prior. IMPRESSION: 1. Right upper extremity catheter tip overlies the SVC 2. Cardiomegaly with central vascular congestion. 3. Small pleural effusions. Airspace disease at the left base may reflect atelectasis or a pneumonia. Electronically Signed   By: Donavan Foil M.D.   On: 04/30/2018 21:39   Dg Chest Port 1 View  Result Date: 04/28/2018 CLINICAL DATA:  82 year old  female code sepsis. EXAM: PORTABLE CHEST 1 VIEW COMPARISON:  Chest radiographs 04/22/2018 and earlier. FINDINGS: Portable AP semi upright view at 0946 hours. Stable cardiomegaly and tortuosity of the thoracic aorta with calcified atherosclerosis. Stable to mildly lower lung volumes. Allowing for portable technique the lungs are clear. No pneumothorax. Paucity bowel gas in the upper abdomen. IMPRESSION: 1. No acute cardiopulmonary abnormality. 2. Chronic cardiomegaly and Aortic Atherosclerosis (ICD10-I70.0). Electronically Signed   By: Genevie Ann M.D.   On: 04/28/2018 10:10   Dg Abd Portable 1v  Result Date: 05/06/2018 CLINICAL DATA:  Abdominal pain and distension. EXAM: PORTABLE ABDOMEN - 1 VIEW COMPARISON:  Pelvic radiograph May 03, 2018 FINDINGS: Bowel gas pattern is nondilated and nonobstructive. Ovoid densities RIGHT abdomen compatible with pills. Vascular calcifications. No intra-abdominal mass effect. Lumbar spine hardware. Bilateral hip hemi arthroplasties. IMPRESSION: 1. Non-specific bowel gas pattern. Electronically Signed   By: Elon Alas M.D.   On: 05/06/2018 03:33   Dg C-arm 1-60 Min  Result Date: 04/23/2018 CLINICAL DATA:  Left hip arthroplasty. EXAM: OPERATIVE left HIP (WITH PELVIS IF PERFORMED)  VIEWS TECHNIQUE: Fluoroscopic spot image(s) were submitted for interpretation post-operatively. COMPARISON:  04/22/2018 FINDINGS: There has been interval placement of a left hip arthroplasty in adequate position normally located. Partially visualized right hip arthroplasty is present. Remainder the exam is unchanged. IMPRESSION: Placement of left hip arthroplasty intact. Electronically Signed   By: Marin Olp M.D.   On: 04/23/2018 15:01   Dg Hip Operative Unilat W Or W/o Pelvis Left  Result Date: 04/23/2018 CLINICAL DATA:  Left hip arthroplasty. EXAM: OPERATIVE left HIP (WITH PELVIS IF PERFORMED)  VIEWS TECHNIQUE: Fluoroscopic spot image(s) were submitted for interpretation post-operatively.  COMPARISON:  04/22/2018 FINDINGS: There has been interval placement of a left hip arthroplasty in adequate position normally located. Partially visualized right hip arthroplasty is present. Remainder the exam is unchanged. IMPRESSION: Placement of left hip arthroplasty intact. Electronically Signed   By: Marin Olp M.D.   On: 04/23/2018 15:01   Dg Hip Unilat W Or Wo Pelvis 2-3 Views Left  Result Date: 04/28/2018 CLINICAL DATA:  82 year old female code sepsis. Left hip arthroplasty post op day five. EXAM: DG HIP (WITH OR WITHOUT PELVIS) 2-3V LEFT COMPARISON:  Intraoperative images 72019, left hip series 04/22/2018. FINDINGS: Portable AP supine view at 0950 hours. Interval bipolar left hip arthroplasty. Chronic right proximal femoral arthroplasty. The visible hardware appears intact. The left hip hardware appears normally aligned. Previous lumbar spine fusion. No newosseous abnormality identified. Femoral artery calcified atherosclerosis. Negative visible bowel gas pattern. IMPRESSION: 1. Left hip arthroplasty with no adverse features. No acute osseous abnormality identified. 2. Visible bowel gas pattern is normal. Electronically Signed   By: Genevie Ann M.D.   On: 04/28/2018 10:16   Dg Hip Unilat W Or Wo Pelvis 2-3 Views Left  Result Date: 04/23/2018 CLINICAL DATA:  Left hip pain after fall. EXAM: DG HIP (WITH OR WITHOUT PELVIS) 2-3V LEFT COMPARISON:  None. FINDINGS: An acute, varus angulated and slightly impacted left transcervical femoral neck fracture is identified. Lower lumbar spinal fusion hardware appears intact. The bony pelvis and pubic rami are intact. No acetabular fracture is noted. Partially included uncemented right bipolar hip arthroplasty is in place without complicating features. Dense bi-iliofemoral atherosclerosis. IMPRESSION: Acute varus angulated transcervical fracture of the left femur with mild impaction of the femoral neck upon the opposing femoral neck. Electronically Signed   By:  Ashley Royalty M.D.   On: 04/23/2018 00:26   [place] Ekg Site Rite  Result Date: 04/29/2018 If Site Rite image not attached, placement could not be confirmed due to current cardiac rhythm.  [place] Ekg Site Rite  Result Date: 04/29/2018 If Site Rite image not attached, placement could not be confirmed due to current cardiac rhythm.   Microbiology: Recent Results (from the past 240 hour(s))  MRSA PCR Screening     Status: None   Collection Time: 05/06/18  8:20 AM  Result Value Ref Range Status   MRSA by PCR NEGATIVE NEGATIVE Final    Comment:        The GeneXpert MRSA Assay (FDA approved for NASAL specimens only), is one component of a comprehensive MRSA colonization surveillance program. It is not intended to diagnose MRSA infection nor to guide or monitor treatment for MRSA infections. Performed at [facility], [place] [address], [place], [place] [address]   C difficile quick scan w PCR reflex     Status: None   Collection Time: 05/06/18  8:42 AM  Result  Value Ref Range Status   C Diff antigen NEGATIVE NEGATIVE Final   C Diff toxin NEGATIVE NEGATIVE Final   C Diff interpretation No C. difficile detected.  Final  Urine Culture     Status: None   Collection Time: 05/06/18  8:45 AM  Result Value Ref Range Status   Specimen Description URINE, RANDOM  Final   Special Requests NONE  Final   Culture   Final    NO GROWTH Performed at [facility], [address], [place], [place] [address]    Report Status 05/07/2018 FINAL  Final     Labs: Basic Metabolic Panel: Recent Labs  Lab 05/06/18 0433 05/07/18 0400 05/08/18 0831 05/09/18 0544 05/10/18 0440  NA 126* 132* 137 142 141  K 3.7 3.6 2.5* 3.2* 3.1*  CL 98 101 105 102 93*  CO2 19* _0 37*  GLUCOSE 67* 85 77 110* 103*  BUN 49* 50* 44* 41* 30*  CREATININE 2.09* 2.11* 1.87* 1.90* 1.52*  CALCIUM 7.5* 7.7* 6.5* 7.9* 8.1*  MG  --   --  2.1 2.2  --   PHOS  --   --   --   --  2.0*   Liver Function Tests: Recent  Labs  Lab 05/05/18 0415 05/06/18 0433 05/07/18 0400 05/08/18 0831 05/09/18 0544 05/10/18 0440  AST _1 --   ALT _2 --   ALKPHOS 58 77 76 58 76  --   BILITOT 0.7 0.7 0.8 0.8 1.4*  --   PROT 4.7* 4.0* 4.4* 4.0* 5.1*  --   ALBUMIN 2.3* 2.0* 2.1* 1.9* 2.6* 2.7*   No results for input(s): LIPASE, AMYLASE in the last 168 hours. No results for input(s): AMMONIA in the last 168 hours. CBC: Recent Labs  Lab 05/05/18 0415 05/06/18 0433 05/07/18 0400 05/08/18 0831 05/09/18 0544  WBC 13.2* 25.9* 19.8* 8.1 10.9*  NEUTROABS 11.6* 24.6* 18.5* 6.9 9.1*  HGB 9.2* 9.9* 9.6* 8.6* 10.6*  HCT 28.7* 32.3* 29.7* 27.1* 33.5*  MCV 94.4 100.0 94.0 96.8 95.7  PLT 233 216 243 177 221   Cardiac Enzymes: No results for input(s): CKTOTAL, CKMB, CKMBINDEX, TROPONINI in the last 168 hours. BNP: BNP (last 3 results) No results for input(s): BNP in the last 8760 hours.  ProBNP (last 3 results) No results for input(s): PROBNP in the last 8760 hours.  CBG: Recent Labs  Lab 05/09/18 0643 05/09/18 1116 05/09/18 1619 05/09/18 2232 05/10/18 0620  GLUCAP 114* 110* 148* 130* 90       Signed:  Nita Sells MD   [facility] 05/10/2018, 8:40 AM

## 2018-05-10 NOTE — Plan of Care (Signed)

## 2018-05-10 NOTE — Progress Notes (Signed)
[facility] picked up patient for transport to SNF. She is accompanied by husband and in stable condition. All belongings in possession. PICC site benign and dressing dry and intact. Patient to SNF with foley and physician aware.

## 2018-05-10 NOTE — Progress Notes (Signed)
Patient will discharge to [facility]. Anticipated discharge date: 05/10/18 Family notified: Loreta Ave, spouse Transportation by: [facility]  Nurse to call report to [phone]. Patient will go to room 223 at the facility.  CSW signing off.  Estanislado Emms, [facility]  Clinical Social Worker

## 2018-05-10 NOTE — Care Management Important Message (Signed)
Important Message  Patient Details  Name: Katie Pace MRN: [id number] Date of Birth: Jun 20, 1936   Medicare Important Message Given:  Yes    Ryley Teater P Uel Davidow 05/10/2018, 3:25 PM

## 2018-05-10 NOTE — Progress Notes (Signed)
Orthopedic Tech Progress Note Patient Details:  Katie Pace Jun 02, 1936 [id number]  Ortho Devices Type of Ortho Device: Ace wrap, Unna boot Ortho Device/Splint Location: Bilateral unna wraps Ortho Device/Splint Interventions: Application   Post Interventions Patient Tolerated: Well Instructions Provided: Care of device   [place] Pink 05/10/2018, 3:01 PM

## 2018-05-10 NOTE — Progress Notes (Signed)
Physical Therapy Treatment Patient Details Name: Katie Pace MRN: [id number] DOB: [birth year] Today's Date: 05/10/2018    History of Present Illness Pt is an 82 y/o female admitted 04/28/18 from SNF due to fever and decr arousal dx with cellulits.  Pt also found to have left base atelectasis vs pneumonia on CXR.  Pt with recent admission on 04/22/18 s/p fall resulting in Lt hip fracture and underwent surgery on 04/23/18 for anterior approach bipolar prosthesis WBAT post op.  PMH-dementia, HTN,  DM, CKD III, Rt TKR, Rt THR, AAA repair.    PT Comments    Pt asleep on arrival to room and difficult to rouse. Pt became more aware once in sitting. Pt able to progress to EOB with max A +2 and sat EOB with min guard. Attempted transfer with sara stedy, however pt was resistive to movement and when husband provided encouragement pt repeatedly stated "i'm just not up for it". Will continue to follow acutely to maximize functional independence and safety with mobility.    Follow Up Recommendations  SNF;Supervision/Assistance - 24 hour     Equipment Recommendations  None recommended by PT    Recommendations for Other Services Other (comment)(Neuro consult)     Precautions / Restrictions Precautions Precautions: Fall;Other (comment) Precaution Comments: incontinent of urine Restrictions Weight Bearing Restrictions: Yes LLE Weight Bearing: Weight bearing as tolerated    Mobility  Bed Mobility Overal bed mobility: Needs Assistance Bed Mobility: Sit to Supine;Supine to Sit     Supine to sit: Max assist;+2 for physical assistance Sit to supine: Max assist;+2 for physical assistance   General bed mobility comments: pt able to initiate LE movement, however required max A for trunk management and scooting.  Transfers Overall transfer level: Needs assistance Equipment used: (+2 with gait belt and bed pad) Transfers: Sit to/from Stand Sit to Stand: +2 physical assistance;Max assist         General transfer comment: sit <> stand attempted 2x. Pt resistive to movement and stating "I'm not up for it". Unable to elevate hips off EOB.  Ambulation/Gait                 Stairs             Wheelchair Mobility    Modified Rankin (Stroke Patients Only)       Balance Overall balance assessment: Needs assistance Sitting-balance support: Feet supported Sitting balance-[name]: Fair Sitting balance - Comments: pt able to sit EOB for ~15 min with intermittant UE support. Occasional sway noted, but no assist required to correct. Postural control: Posterior lean Standing balance support: Bilateral upper extremity supported Standing balance-[name]: Zero Standing balance comment: two person max assist .                             Cognition Arousal/Alertness: Awake/alert Behavior During Therapy: Agitated Overall Cognitive Status: Impaired/Different from baseline Area of Impairment: Orientation;Attention;Memory;Following commands;Safety/judgement;Awareness;Problem solving                 Orientation Level: Disoriented to;Time;Situation;Place Current Attention Level: Sustained Memory: Decreased recall of precautions;Decreased short-term memory Following Commands: Follows one step commands inconsistently Safety/Judgement: Decreased awareness of safety;Decreased awareness of deficits Awareness: Intellectual Problem Solving: Slow processing;Decreased initiation;Difficulty sequencing;Requires verbal cues;Requires tactile cues General Comments: pt asleep on arrival to room and difficult to rouse. Once awake able to respond to questions, but aggitated and repeating "I'm not up for it" when attempting transfer.  Exercises      General Comments General comments (skin integrity, edema, etc.): Husband present and reported pt wanted to get up this AM      Pertinent Vitals/Pain Pain Assessment: Faces Faces Pain Scale: Hurts even more Pain  Location: painful with movement Pain Descriptors / Indicators: Grimacing(pt yells with movement) Pain Intervention(s): Monitored during session;Limited activity within patient's tolerance    Home Living                      Prior Function            PT Goals (current goals can now be found in the care plan section) Acute Rehab PT Goals Patient Stated Goal: to get stronger PT Goal Formulation: With patient/family Time For Goal Achievement: 05/14/18 Potential to Achieve Goals: Fair Progress towards PT goals: Not progressing toward goals - comment(resistive to movement this tx.)    Frequency    Min 3X/week      PT Plan Current plan remains appropriate    Co-evaluation PT/OT/SLP Co-Evaluation/Treatment: Yes Reason for Co-Treatment: Complexity of the patient's impairments (multi-system involvement);Necessary to address cognition/behavior during functional activity;For patient/therapist safety;To address functional/ADL transfers PT goals addressed during session: Mobility/safety with mobility;Balance        AM-PAC PT "6 Clicks" Daily Activity  Outcome Measure  Difficulty turning over in bed (including adjusting bedclothes, sheets and blankets)?: Unable Difficulty moving from lying on back to sitting on the side of the bed? : Unable Difficulty sitting down on and standing up from a chair with arms (e.g., wheelchair, bedside commode, etc,.)?: Unable Help needed moving to and from a bed to chair (including a wheelchair)?: Total Help needed walking in hospital room?: Total Help needed climbing 3-5 steps with a railing? : Total 6 Click Score: 6    End of Session Equipment Utilized During Treatment: Gait belt Activity Tolerance: Patient limited by fatigue Patient left: with call bell/phone within reach;with family/visitor present;in bed Nurse Communication: Mobility status PT Visit Diagnosis: Other abnormalities of gait and mobility (R26.89);Pain Pain - Right/Left:  Left Pain - part of body: Hip     Time: 1036-1101 PT Time Calculation (min) (ACUTE ONLY): 25 min  Charges:  $Therapeutic Activity: 8-22 mins                     Benjiman Core, [place] Pager [phone] Acute Rehab   Allena Katz 05/10/2018, 11:20 AM

## 2018-05-10 NOTE — Progress Notes (Signed)
RT arrived in pt room to place on BIPAP and was informed by family that she has not worn it in days. Last date documented in flow sheet was on 05/06/18 @ 0412. Family states she is not even on oxygen at this time and does not feel that she needs it. V60 still in room for pt use.

## 2018-05-10 NOTE — Clinical Social Work Placement (Signed)
   CLINICAL SOCIAL WORK PLACEMENT  NOTE  Date:  05/10/2018  Patient Details  Name: Katie Pace MRN: [id number] Date of Birth: 11-Feb-1936  Clinical Social Work is seeking post-discharge placement for this patient at the [place] level of care (*CSW will initial, date and re-position this form in  chart as items are completed):  Yes   Patient/family provided with [facility] Work Department's list of facilities offering this level of care within the geographic area requested by the patient (or if unable, by the patient's family).  Yes   Patient/family informed of their freedom to choose among providers that offer the needed level of care, that participate in Medicare, Medicaid or managed care program needed by the patient, have an available bed and are willing to accept the patient.  Yes   Patient/family informed of [facility]'s ownership interest in [facility] and [facility], as well as of the fact that they are under no obligation to receive care at these facilities.  PASRR submitted to EDS on       PASRR number received on       Existing PASRR number confirmed on 04/29/18     FL2 transmitted to all facilities in geographic area requested by pt/family on 04/29/18     FL2 transmitted to all facilities within larger geographic area on 05/02/18     Patient informed that his/her managed care company has contracts with or will negotiate with certain facilities, including the following:        Yes   Patient/family informed of bed offers received.  Patient chooses bed at [facility] recommends and patient chooses bed at      Patient to be transferred to [facility] on 05/10/18.  Patient to be transferred to facility by [facility]     Patient family notified on 05/10/18 of transfer.  Name of family member notified:  Loreta Ave, spouse     PHYSICIAN Please sign DNR     Additional Comment:     _______________________________________________ Estanislado Emms, LCSW 05/10/2018, 9:19 AM

## 2018-05-11 ENCOUNTER — Encounter: Payer: Self-pay | Admitting: Adult Health

## 2018-05-11 DIAGNOSIS — Z0000 Encounter for general adult medical examination without abnormal findings: Secondary | ICD-10-CM

## 2018-05-11 NOTE — Progress Notes (Signed)
This encounter was created in error - please disregard.

## 2018-05-12 ENCOUNTER — Non-Acute Institutional Stay (SKILLED_NURSING_FACILITY): Payer: Medicare Other | Admitting: Internal Medicine

## 2018-05-12 ENCOUNTER — Encounter: Payer: Self-pay | Admitting: Internal Medicine

## 2018-05-12 DIAGNOSIS — G9340 Encephalopathy, unspecified: Secondary | ICD-10-CM

## 2018-05-12 DIAGNOSIS — R1310 Dysphagia, unspecified: Secondary | ICD-10-CM | POA: Insufficient documentation

## 2018-05-12 DIAGNOSIS — L03116 Cellulitis of left lower limb: Secondary | ICD-10-CM | POA: Diagnosis not present

## 2018-05-12 DIAGNOSIS — S72002G Fracture of unspecified part of neck of left femur, subsequent encounter for closed fracture with delayed healing: Secondary | ICD-10-CM

## 2018-05-12 DIAGNOSIS — N183 Chronic kidney disease, stage 3 unspecified: Secondary | ICD-10-CM

## 2018-05-12 DIAGNOSIS — R1312 Dysphagia, oropharyngeal phase: Secondary | ICD-10-CM

## 2018-05-12 NOTE — Assessment & Plan Note (Signed)
See 05/12/18 Psych consult SLUMS testing

## 2018-05-12 NOTE — Assessment & Plan Note (Signed)
Speech Therapy consult Deprescribe

## 2018-05-12 NOTE — Assessment & Plan Note (Signed)
Monitor BMP

## 2018-05-12 NOTE — Assessment & Plan Note (Signed)
Afebrile Status post full course of antibiotics

## 2018-05-12 NOTE — Patient Instructions (Signed)
See assessment and plan under each diagnosis in the problem list and acutely for this visit 

## 2018-05-12 NOTE — Assessment & Plan Note (Addendum)
PT/OT at SNF Follow-up with Dr. Erlinda Hong for suture removal

## 2018-05-12 NOTE — Progress Notes (Signed)
NURSING HOME LOCATION:  [facility] ROOM NUMBER:  [id number]  CODE STATUS:  DNR  PCP:  [name], MD  [place] [address]  This is a comprehensive admission note to [facility] performed on this date less than 30 days from date of admission.  Included are preadmission medical/surgical history; reconciled medication list; family history; social history and comprehensive review of systems.  Corrections and additions to the records were documented. Comprehensive physical exam was also performed. Additionally a clinical summary was entered for each active diagnosis pertinent to this admission in the Problem List to enhance continuity of care.  HPI: Patient was hospitalized 7/25-05/10/2018 for cellulitis of left lower extremity associated with sepsis in the context of diabetes,CKD, and panhypopituitarism. She had undergone hemiarthroplasty on 7/20 by Dr. Erlinda Hong and was rehabbing at another nursing facility. The day of admission her husband found her poorly responsive. Her temperature  was 101 .  Patient was found to have encephalopathy associated with sepsis.  WBC was 11,400.  Lactic acid was normal at 0.8.  Sh and B12 levels were normal.  She did exhibit mild tachypnea and mild tachycardia. Edema and erythema were present around the incision of the left hip extending to the knee.  She was admitted for IV antibiotic therapy.  The patient received vancomycin, aztreonam and Levaquin because of history of penicillin allergy. The patient exhibited wheezing;imaging 8/3 suggested fluid load. Iatrogenic fluid overload & hypoxic respiratory failure 8/1 were treated with aggressive diuresis and BiPAP with response.Leukocytosis was present ;clinically aspiration was suspected. Because of the encephalopathy opiods were held & Tylenol initiated. Recommendations at discharge included BMP to assess hypokalemia.  Cardizem was to be titrated upward in event of tachycardia. Lovenox was to  be continued until 8/13 which would complete 20 days of DVT prophylaxis for prior hip surgery. Unna boot was to be continued for both lower extremities with redressing weekly.  Orthopedic follow-up was to be completed for removal of sutures. Monitor for possible aspiration was recommended, speech therapy was to continue follow-up. Patient did receive sliding scale insulin, A1c was prediabetic at 6%. Labs @ discharge revealed a potassium of 3.1, creatinine 1.52, and GFR 31.  She exhibited normochromic, normocytic anemia with hemoglobin 10.6 and hematocrit 33.5.  Past medical and surgical history: Other medical diagnoses include history of stroke, GERD, peripheral vascular disease hypertension, chronic anemia. AAA repair was completed in 2013.  History of hysterectomy. In 2009 she had total knee arthroplasty on the right  Social history: Nondrinker.Former smoker;she quit in 1963.  Family history: Reviewed, noncontributory due to age   Review of systems: Patient is unable to give the date and becomes agitated when interviewed.  History was obtained from her husband.  He states that she basically was bedridden or chair ridden unless she used a walker to go to the bathroom.  He states that she does have intermittent dysphagia, particularly with large pills.  Validates that she has difficulty retrieving words.  He describes a chronically poor appetite.  He describes agitation; for example when her minister visited she told him to "shut up"  Physical exam:  Pertinent or positive findings: appears chronically ill & poorly nourished.  Exhibits a stammering, halting speech pattern. Intermittently she would repeat nonsensical phrases such as " sit phi".  She became agitated with me and told me to leave her alone. She was having difficulty swallowing pills.  There appears to be a shallow posterior pharyngeal ulcer of the  soft palate.  Heart sounds are distant and difficult to discern.  She has minor scattered  expiratory rhonchi.  She exhibits a nonproductive cough.  There is trace edema at the ankles.  Severe atrophy of limbs, especially the lower extremities below the knees.  Has mixed PIP/DIP arthritic changes.  She has scarring over the left shin and scar formation over the right shin.  Bruising is noted over the upper extremities.  She is diffusely weak.  General appearance:  no acute distress, increased work of breathing is present.   Lymphatic: No lymphadenopathy about the head, neck, axilla. Eyes: No conjunctival inflammation or lid edema is present. There is no scleral icterus. Ears:  External ear exam shows no significant lesions or deformities.   Nose:  External nasal examination shows no deformity or inflammation. Nasal mucosa are pink and moist without lesions, exudates Oral exam: There is no oropharyngeal erythema or exudate. Neck:  No thyromegaly, masses, tenderness noted.    Heart:  No gallop, murmur, click, rub.  Lungs:  without wheezes, rubs. Abdomen: Bowel sounds are normal.  Abdomen is soft and nontender with no organomegaly, hernias, masses. GU: Deferred  Extremities:  No cyanosis, clubbing Neurologic exam:   Balance, Rhomberg, finger to nose testing could not be completed due to clinical state Skin: Warm & dry  No significant  rash.  See clinical summary under each active problem in the Problem List with associated updated therapeutic plan

## 2018-05-17 ENCOUNTER — Non-Acute Institutional Stay (SKILLED_NURSING_FACILITY): Payer: Medicare Other | Admitting: Internal Medicine

## 2018-05-17 ENCOUNTER — Encounter (HOSPITAL_COMMUNITY): Payer: Self-pay | Admitting: *Deleted

## 2018-05-17 ENCOUNTER — Emergency Department (HOSPITAL_COMMUNITY): Payer: Medicare Other

## 2018-05-17 ENCOUNTER — Encounter: Payer: Self-pay | Admitting: Internal Medicine

## 2018-05-17 ENCOUNTER — Inpatient Hospital Stay (HOSPITAL_COMMUNITY)
Admission: EM | Admit: 2018-05-17 | Discharge: 2018-05-22 | DRG: 871 | Disposition: A | Discharge status: Hospice | Payer: Medicare Other | Attending: Family Medicine | Admitting: Family Medicine

## 2018-05-17 ENCOUNTER — Other Ambulatory Visit: Payer: Self-pay

## 2018-05-17 DIAGNOSIS — A419 Sepsis, unspecified organism: Secondary | ICD-10-CM | POA: Diagnosis present

## 2018-05-17 DIAGNOSIS — J9811 Atelectasis: Secondary | ICD-10-CM | POA: Diagnosis present

## 2018-05-17 DIAGNOSIS — I10 Essential (primary) hypertension: Secondary | ICD-10-CM | POA: Diagnosis not present

## 2018-05-17 DIAGNOSIS — Z515 Encounter for palliative care: Secondary | ICD-10-CM

## 2018-05-17 DIAGNOSIS — Z8619 Personal history of other infectious and parasitic diseases: Secondary | ICD-10-CM

## 2018-05-17 DIAGNOSIS — J90 Pleural effusion, not elsewhere classified: Secondary | ICD-10-CM | POA: Diagnosis present

## 2018-05-17 DIAGNOSIS — Z7982 Long term (current) use of aspirin: Secondary | ICD-10-CM

## 2018-05-17 DIAGNOSIS — Z87891 Personal history of nicotine dependence: Secondary | ICD-10-CM

## 2018-05-17 DIAGNOSIS — D696 Thrombocytopenia, unspecified: Secondary | ICD-10-CM | POA: Diagnosis present

## 2018-05-17 DIAGNOSIS — F0390 Unspecified dementia without behavioral disturbance: Secondary | ICD-10-CM | POA: Diagnosis present

## 2018-05-17 DIAGNOSIS — Z96651 Presence of right artificial knee joint: Secondary | ICD-10-CM | POA: Diagnosis present

## 2018-05-17 DIAGNOSIS — Z7902 Long term (current) use of antithrombotics/antiplatelets: Secondary | ICD-10-CM

## 2018-05-17 DIAGNOSIS — N184 Chronic kidney disease, stage 4 (severe): Secondary | ICD-10-CM | POA: Diagnosis present

## 2018-05-17 DIAGNOSIS — Z888 Allergy status to other drugs, medicaments and biological substances status: Secondary | ICD-10-CM

## 2018-05-17 DIAGNOSIS — E870 Hyperosmolality and hypernatremia: Secondary | ICD-10-CM | POA: Diagnosis present

## 2018-05-17 DIAGNOSIS — N170 Acute kidney failure with tubular necrosis: Secondary | ICD-10-CM

## 2018-05-17 DIAGNOSIS — R0902 Hypoxemia: Secondary | ICD-10-CM

## 2018-05-17 DIAGNOSIS — E875 Hyperkalemia: Secondary | ICD-10-CM

## 2018-05-17 DIAGNOSIS — G9340 Encephalopathy, unspecified: Secondary | ICD-10-CM | POA: Diagnosis not present

## 2018-05-17 DIAGNOSIS — Z7952 Long term (current) use of systemic steroids: Secondary | ICD-10-CM

## 2018-05-17 DIAGNOSIS — J9601 Acute respiratory failure with hypoxia: Secondary | ICD-10-CM | POA: Diagnosis present

## 2018-05-17 DIAGNOSIS — Z79899 Other long term (current) drug therapy: Secondary | ICD-10-CM

## 2018-05-17 DIAGNOSIS — N390 Urinary tract infection, site not specified: Secondary | ICD-10-CM | POA: Diagnosis present

## 2018-05-17 DIAGNOSIS — Z8673 Personal history of transient ischemic attack (TIA), and cerebral infarction without residual deficits: Secondary | ICD-10-CM

## 2018-05-17 DIAGNOSIS — E2740 Unspecified adrenocortical insufficiency: Secondary | ICD-10-CM | POA: Diagnosis present

## 2018-05-17 DIAGNOSIS — R6521 Severe sepsis with septic shock: Secondary | ICD-10-CM | POA: Diagnosis present

## 2018-05-17 DIAGNOSIS — Z9189 Other specified personal risk factors, not elsewhere classified: Secondary | ICD-10-CM

## 2018-05-17 DIAGNOSIS — R5381 Other malaise: Secondary | ICD-10-CM | POA: Diagnosis present

## 2018-05-17 DIAGNOSIS — E876 Hypokalemia: Secondary | ICD-10-CM | POA: Diagnosis present

## 2018-05-17 DIAGNOSIS — E230 Hypopituitarism: Secondary | ICD-10-CM | POA: Diagnosis present

## 2018-05-17 DIAGNOSIS — I6522 Occlusion and stenosis of left carotid artery: Secondary | ICD-10-CM | POA: Diagnosis present

## 2018-05-17 DIAGNOSIS — R4182 Altered mental status, unspecified: Secondary | ICD-10-CM | POA: Diagnosis present

## 2018-05-17 DIAGNOSIS — R479 Unspecified speech disturbances: Secondary | ICD-10-CM | POA: Diagnosis present

## 2018-05-17 DIAGNOSIS — Z66 Do not resuscitate: Secondary | ICD-10-CM | POA: Diagnosis present

## 2018-05-17 DIAGNOSIS — Z7401 Bed confinement status: Secondary | ICD-10-CM

## 2018-05-17 DIAGNOSIS — I509 Heart failure, unspecified: Secondary | ICD-10-CM | POA: Diagnosis not present

## 2018-05-17 DIAGNOSIS — X58XXXA Exposure to other specified factors, initial encounter: Secondary | ICD-10-CM | POA: Diagnosis present

## 2018-05-17 DIAGNOSIS — Z880 Allergy status to penicillin: Secondary | ICD-10-CM

## 2018-05-17 DIAGNOSIS — E039 Hypothyroidism, unspecified: Secondary | ICD-10-CM | POA: Diagnosis present

## 2018-05-17 DIAGNOSIS — E1151 Type 2 diabetes mellitus with diabetic peripheral angiopathy without gangrene: Secondary | ICD-10-CM | POA: Diagnosis present

## 2018-05-17 DIAGNOSIS — G92 Toxic encephalopathy: Secondary | ICD-10-CM | POA: Diagnosis present

## 2018-05-17 DIAGNOSIS — Z885 Allergy status to narcotic agent status: Secondary | ICD-10-CM

## 2018-05-17 DIAGNOSIS — Z881 Allergy status to other antibiotic agents status: Secondary | ICD-10-CM

## 2018-05-17 DIAGNOSIS — S32591A Other specified fracture of right pubis, initial encounter for closed fracture: Secondary | ICD-10-CM | POA: Diagnosis present

## 2018-05-17 DIAGNOSIS — M1990 Unspecified osteoarthritis, unspecified site: Secondary | ICD-10-CM | POA: Diagnosis present

## 2018-05-17 DIAGNOSIS — N183 Chronic kidney disease, stage 3 (moderate): Secondary | ICD-10-CM

## 2018-05-17 DIAGNOSIS — D539 Nutritional anemia, unspecified: Secondary | ICD-10-CM

## 2018-05-17 DIAGNOSIS — N179 Acute kidney failure, unspecified: Secondary | ICD-10-CM | POA: Diagnosis present

## 2018-05-17 DIAGNOSIS — L8990 Pressure ulcer of unspecified site, unspecified stage: Secondary | ICD-10-CM | POA: Diagnosis present

## 2018-05-17 DIAGNOSIS — Z8679 Personal history of other diseases of the circulatory system: Secondary | ICD-10-CM

## 2018-05-17 DIAGNOSIS — R0602 Shortness of breath: Secondary | ICD-10-CM

## 2018-05-17 DIAGNOSIS — K219 Gastro-esophageal reflux disease without esophagitis: Secondary | ICD-10-CM | POA: Diagnosis present

## 2018-05-17 DIAGNOSIS — Z7189 Other specified counseling: Secondary | ICD-10-CM

## 2018-05-17 DIAGNOSIS — E1122 Type 2 diabetes mellitus with diabetic chronic kidney disease: Secondary | ICD-10-CM | POA: Diagnosis present

## 2018-05-17 DIAGNOSIS — A4159 Other Gram-negative sepsis: Principal | ICD-10-CM | POA: Diagnosis present

## 2018-05-17 DIAGNOSIS — Z96643 Presence of artificial hip joint, bilateral: Secondary | ICD-10-CM | POA: Diagnosis present

## 2018-05-17 DIAGNOSIS — I129 Hypertensive chronic kidney disease with stage 1 through stage 4 chronic kidney disease, or unspecified chronic kidney disease: Secondary | ICD-10-CM | POA: Diagnosis present

## 2018-05-17 DIAGNOSIS — R1310 Dysphagia, unspecified: Secondary | ICD-10-CM | POA: Diagnosis present

## 2018-05-17 DIAGNOSIS — K449 Diaphragmatic hernia without obstruction or gangrene: Secondary | ICD-10-CM

## 2018-05-17 LAB — I-STAT CG4 LACTIC ACID, ED
Lactic Acid, Venous: 2.87 mmol/L (ref 0.5–1.9)
Lactic Acid, Venous: 3.14 mmol/L (ref 0.5–1.9)

## 2018-05-17 LAB — CBC WITH DIFFERENTIAL/PLATELET
Abs Immature Granulocytes: 0.1 10*3/uL (ref 0.0–0.1)
Basophils Absolute: 0.2 10*3/uL — ABNORMAL HIGH (ref 0.0–0.1)
Basophils Relative: 1 %
EOS PCT: 2 %
Eosinophils Absolute: 0.4 10*3/uL (ref 0.0–0.7)
HEMATOCRIT: 44.8 % (ref 36.0–46.0)
HEMOGLOBIN: 12.6 g/dL (ref 12.0–15.0)
Immature Granulocytes: 1 %
LYMPHS ABS: 3.3 10*3/uL (ref 0.7–4.0)
LYMPHS PCT: 20 %
MCH: 30.3 pg (ref 26.0–34.0)
MCHC: 28.1 g/dL — AB (ref 30.0–36.0)
MCV: 107.7 fL — ABNORMAL HIGH (ref 78.0–100.0)
MONO ABS: 1.7 10*3/uL — AB (ref 0.1–1.0)
Monocytes Relative: 10 %
Neutro Abs: 11.3 10*3/uL — ABNORMAL HIGH (ref 1.7–7.7)
Neutrophils Relative %: 66 %
Platelets: 142 10*3/uL — ABNORMAL LOW (ref 150–400)
RBC: 4.16 MIL/uL (ref 3.87–5.11)
RDW: 17.4 % — ABNORMAL HIGH (ref 11.5–15.5)
WBC: 16.9 10*3/uL — ABNORMAL HIGH (ref 4.0–10.5)

## 2018-05-17 LAB — URINALYSIS, ROUTINE W REFLEX MICROSCOPIC
BILIRUBIN URINE: NEGATIVE
Glucose, UA: NEGATIVE mg/dL
Ketones, ur: 5 mg/dL — AB
Nitrite: NEGATIVE
PROTEIN: 100 mg/dL — AB
SPECIFIC GRAVITY, URINE: 1.017 (ref 1.005–1.030)
pH: 5 (ref 5.0–8.0)

## 2018-05-17 LAB — COMPREHENSIVE METABOLIC PANEL
ALBUMIN: 2.9 g/dL — AB (ref 3.5–5.0)
ALK PHOS: 137 U/L — AB (ref 38–126)
ALT: 12 U/L (ref 0–44)
AST: 22 U/L (ref 15–41)
Anion gap: 15 (ref 5–15)
BUN: 60 mg/dL — ABNORMAL HIGH (ref 8–23)
CALCIUM: 8.3 mg/dL — AB (ref 8.9–10.3)
CO2: 22 mmol/L (ref 22–32)
CREATININE: 6.33 mg/dL — AB (ref 0.44–1.00)
Chloride: 113 mmol/L — ABNORMAL HIGH (ref 98–111)
GFR calc non Af Amer: 6 mL/min — ABNORMAL LOW (ref >60)
GFR, EST AFRICAN AMERICAN: 6 mL/min — AB (ref >60)
GLUCOSE: 95 mg/dL (ref 70–99)
Potassium: 6.1 mmol/L — ABNORMAL HIGH (ref 3.5–5.1)
SODIUM: 150 mmol/L — AB (ref 135–145)
Total Bilirubin: 0.7 mg/dL (ref 0.3–1.2)
Total Protein: 6.5 g/dL (ref 6.5–8.1)

## 2018-05-17 LAB — PROTIME-INR
INR: 1.13
Prothrombin Time: 14.4 seconds (ref 11.4–15.2)

## 2018-05-17 LAB — BASIC METABOLIC PANEL
BUN: 61 — AB (ref 4–21)
Creatinine: 5.6 — AB (ref 0.5–1.1)
GLUCOSE: 92
Potassium: 6.7 — AB (ref 3.4–5.3)
SODIUM: 151 — AB (ref 137–147)

## 2018-05-17 MED ORDER — VANCOMYCIN HCL 10 G IV SOLR
1500.0000 mg | Freq: Once | INTRAVENOUS | Status: AC
  Administered 2018-05-17: 1500 mg via INTRAVENOUS
  Filled 2018-05-17: qty 1500

## 2018-05-17 MED ORDER — PIPERACILLIN-TAZOBACTAM 3.375 G IVPB 30 MIN
3.3750 g | Freq: Once | INTRAVENOUS | Status: AC
  Administered 2018-05-17: 3.375 g via INTRAVENOUS
  Filled 2018-05-17: qty 50

## 2018-05-17 MED ORDER — HYDROCORTISONE NA SUCCINATE PF 100 MG IJ SOLR
100.0000 mg | Freq: Once | INTRAMUSCULAR | Status: DC
  Filled 2018-05-17: qty 2

## 2018-05-17 MED ORDER — VANCOMYCIN HCL IN DEXTROSE 1-5 GM/200ML-% IV SOLN: 1000.0000 mg | Freq: Once | INTRAVENOUS | Status: DC

## 2018-05-17 MED ORDER — LACTATED RINGERS IV BOLUS (SEPSIS)
1000.0000 mL | Freq: Once | INTRAVENOUS | Status: AC
  Administered 2018-05-17: 1000 mL via INTRAVENOUS

## 2018-05-17 MED ORDER — DEXTROSE 10 % IV SOLN
Freq: Once | INTRAVENOUS | Status: AC
  Administered 2018-05-17: via INTRAVENOUS

## 2018-05-17 MED ORDER — SODIUM CHLORIDE 0.9 % IV SOLN
1.0000 g | INTRAVENOUS | Status: DC
  Administered 2018-05-18: 1 g via INTRAVENOUS
  Filled 2018-05-17: qty 1

## 2018-05-17 MED ORDER — SODIUM CHLORIDE 0.9 % IV SOLN
1.0000 g | Freq: Once | INTRAVENOUS | Status: AC
  Administered 2018-05-17: 1 g via INTRAVENOUS
  Filled 2018-05-17: qty 10

## 2018-05-17 MED ORDER — VANCOMYCIN VARIABLE DOSE PER UNSTABLE RENAL FUNCTION (PHARMACIST DOSING): Status: DC

## 2018-05-17 MED ORDER — LACTATED RINGERS IV BOLUS (SEPSIS)
250.0000 mL | Freq: Once | INTRAVENOUS | Status: AC
  Administered 2018-05-17: 250 mL via INTRAVENOUS

## 2018-05-17 MED ORDER — INSULIN ASPART 100 UNIT/ML IV SOLN
5.0000 [IU] | Freq: Once | INTRAVENOUS | Status: AC
  Administered 2018-05-17: 5 [IU] via INTRAVENOUS
  Filled 2018-05-17: qty 0.05

## 2018-05-17 NOTE — Patient Instructions (Signed)
See assessment and plan under each diagnosis in the problem list and acutely for this visit 

## 2018-05-17 NOTE — ED Notes (Signed)
Patient transported to CT 

## 2018-05-17 NOTE — Progress Notes (Signed)
Pharmacy Antibiotic Note  Katie Pace is a 82 y.o. female admitted on 05/17/2018 with sepsis.  Pharmacy has been consulted for vancomycin and Cefepime dosing. Patient has received a dose of Zosyn in the ED. Pt is from nursing facility with altered mental status. WBC 16.9. LA 2.87. SCr 6.33 (BL ~ 1.52). CrCl < 20 mL/min.    Plan: -Vancomycin 1500 mg IV once, then vancomycin dosing per levels  -Cefepime 1 gm IV Q 24 hours -Monitor CBC, renal fx, cultures and clinical progress -VT at SS     Temp (24hrs), Avg:98.5 F (36.9 C), Min:96.7 F (35.9 C), Max:100.2 F (37.9 C)  Recent Labs  Lab 05/17/18 05/17/18 1940  CREATININE 5.6*  --   LATICACIDVEN  --  2.87*    Estimated Creatinine Clearance: 7.8 mL/min (A) (by C-G formula based on SCr of 5.6 mg/dL (A)).    Allergies  Allergen Reactions  . Codeine Nausea Only  . Dilaudid [Hydromorphone Hcl] Nausea Only  . Lincomycin Hcl Other (See Comments)    Big bumps bilateral legs  . Macrodantin Hives  . Zolpidem Tartrate Other (See Comments)    Stroke  . Azithromycin Rash  . Penicillins Rash    Has patient had a PCN reaction causing immediate rash, facial/tongue/throat swelling, SOB or lightheadedness with hypotension: Yes Has patient had a PCN reaction causing severe rash involving mucus membranes or skin necrosis: No Has patient had a PCN reaction that required hospitalization: No Has patient had a PCN reaction occurring within the last 10 years: No If all of the above answers are "NO", then may proceed with Cephalosporin use.    Antimicrobials this admission: Vanc 8/13 >>  Cefepime 8/13 >>   Dose adjustments this admission: None  Microbiology results: 8/13 BCx>> 8/2 MRSA PCR: neg  Thank you for allowing pharmacy to be a part of this patient's care.  Albertina Parr, PharmD., BCPS Clinical Pharmacist Clinical phone for 05/17/18 until 3:30pm: [phone] If after 3:30pm, please refer to [facility] for unit-specific pharmacist

## 2018-05-17 NOTE — Assessment & Plan Note (Addendum)
Nonfocal exam; BMP reveals hyponatremia, hyperkalemia, and ARF Hold medications which could be contributory

## 2018-05-17 NOTE — Progress Notes (Signed)
NURSING HOME LOCATION:  [facility] ROOM NUMBER:  [id number]  CODE STATUS:  DNR (confirmed with husband 05/17/18)  PCP:  [name], MD  [address] [place] [address]  This is a nursing facility follow up of acute issue issue of hypoxemia with mental status changes.  Interim medical record and care since last [name] visit was updated with review of diagnostic studies and change in clinical status since last visit were documented.  HPI: Staff reports that patient became less responsive as of yesterday afternoon.  This  morning she is nonverbal, not responsive & unable to eat Her sister, Katie Pace has been with her for almost 3 hours this morning.  She states that her sister will look at her but not respond verbally. Her sister states she does move all extremities  Her sister validates that the oxygen level did drop down to 64% with associated "gasping" respirations.  Oxygen was applied with sats of 98% on 3 L.   Review of systems: The patient is nonverbal as noted.  According to her sister she has had variable memory deficits for at least a year. For instance she gives her maiden name instead of her married name when asked. On  recent SLUMS mental status exam she scored 3 out of 30. Epic labs 8/6 had revealed a creatinine of 1.5 potassium 3.1.  KCl 20 milliequivalents 2 pills 3 times a day was ordered beginning 05/10/2018.  This was discontinued 8/12 as potassium was 7.5 on 8/12. Repeat BMP after receiving 15 mg of Kayexalate X 2 reveals a potassium of 6.7.  Unfortunately labs also document ARF with creatinine 5.6, BUN is 60.8, and GFR less than 15. The most recent chest x-ray 05/07/2018 @ [facility] was personally reviewed. This revealed cardiomegaly with bilateral effusions which were actually increased compared to prior films.  There was also subsegmental atelectasis on the left.   Mobile imaging was repeated today.  This shows dramatic improvement in the bilateral  effusions and atelectasis.  Physical exam:  Pertinent or positive findings: Initially the patient's eyes were deviated to the right, but later became midline.  The pupils were responsive to light.  She does resist oral exam.  Teeth appear coated. Heart sounds are distant.  She has intermittent low-grade rhonchi, rales, and intermittent apnea.  Abdomen is protuberant.  Pedal pulses are decreased.  She has marked arthritic changes of her fingers and toes. Limbs reveal muscle atrophy.  All extremities are limp and dropping to the bed when lifted & released.  She can move all 4 extremities. There is intermittent involuntary jerking of musculature.  She has a bruise on the left knee. Shins are wrapped.  General appearance:  no acute distress, increased work of breathing is present despite initial presentation   Lymphatic: No lymphadenopathy about the head, neck, axilla. Eyes: No conjunctival inflammation or lid edema is present. There is no scleral icterus. Ears:  External ear exam shows no significant lesions or deformities.   Nose:  External nasal examination shows no deformity or inflammation. Nasal mucosa are pink and moist without lesions, exudates Oral exam:  Lips and gums are healthy appearing.  Neck:  No thyromegaly, masses, tenderness noted.    Heart:  No gallop, murmur, click, rub .  Abdomen: Bowel sounds are normal. Abdomen is soft and nontender with no organomegaly, hernias, masses. GU: Deferred  Extremities:  No cyanosis, clubbing, edema  Skin: Warm & dry w/o tenting. No significant  rash.  See summary under each active problem in the Problem List with associated updated therapeutic plan

## 2018-05-17 NOTE — ED Provider Notes (Signed)
[facility] EMERGENCY DEPARTMENT Provider Note   CSN: [id number] Arrival date & time: 05/17/18  1912     History   Chief Complaint Chief Complaint  Patient presents with  . Respiratory Distress  . Altered Mental Status    HPI Quenna T Muzzy is a 82 y.o. female with a history of AAA status post repair and endarterectomy 2013, Sheehan syndrome, and stroke who presents due to altered mental status and respiratory distress.  She is altered and is unable to provide any further history.  Her husband says that she had a hip replacement done on 7/20 has been at a rehabilitation facility.  He says that she has been confused and altered for the past day.  He is not able to provide any further history.  He says that she has been at the hospital recently due to concern for infection.  No additional information is immediately available about the patient.  She is DNR.  HPI  Past Medical History:  Diagnosis Date  . AAA (abdominal aortic aneurysm) (HCC)    S/p aortic repair and endarterectomy 12/2011   . Anemia    hx  . Arthritis   . Blood transfusion    back in 1973  . C. difficile diarrhea   . Carotid stenosis, left    S/p left CEA 09/2008  . Diabetes mellitus    type 11  . Endocrine disturbance    gland has not  worked until 1970.Marland Kitchen..dx in 1977  . GERD (gastroesophageal reflux disease)    on meds to help....meds do well   . Heart murmur   . Hypertension   . Hypothyroidism   . Pneumonia    hx  . PVD (peripheral vascular disease) ([place])   . 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 Stroke [facility])    Ischemic x 2       last one 2007    Patient Active Problem List   Diagnosis Date Noted  . Hyperkalemia 05/17/2018  . Congestive heart failure (CHF) ([place]) 05/17/2018  . Dysphagia 05/12/2018  . Pressure injury of skin 05/06/2018  . Cellulitis of left lower extremity 04/28/2018  . Sepsis ([place]) 04/28/2018  . CKD (chronic kidney disease) stage 4, GFR 15-29 ml/min (HCC)  04/28/2018  . Panhypopituitarism ([place]) 04/28/2018  . Acute cystitis without hematuria   . CKD (chronic kidney disease) stage 3, GFR 30-59 ml/min (HCC) 04/23/2018  . Hypothyroidism 04/23/2018  . Femur fracture, left ([place]) 04/23/2018  . Hip fracture ([place]) 04/23/2018  . PAD (peripheral artery disease) ([place]) 02/18/2016  . Carotid stenosis, asymptomatic 10/29/2015  . Difficulty in walking(719.7) 06/26/2013  . Bilateral leg weakness 06/26/2013  . Occlusion and stenosis of carotid artery without mention of cerebral infarction 04/04/2013  . Aftercare following surgery of the circulatory system, [place] 04/04/2013  . Anemia 10/19/2012  . Spondylolisthesis of lumbar region L3-4 L4-5 with stenosis 10/18/2012  . CKD (chronic kidney disease), stage II 10/18/2012  . C. difficile diarrhea 04/02/2012  . Acute renal failure ([place]) 04/01/2012  . Encephalopathy 04/01/2012  . Sepsis(995.91) 04/01/2012  . Vomiting 04/01/2012  . Hypokalemia 04/01/2012  . Chest pain 02/08/2012  . Renal insufficiency 02/08/2012  . Sheehan syndrome ([place])   . Hypertension   . Diabetes mellitus ([place])   . AAA (abdominal aortic aneurysm) ([place]) 12/28/2011  . Abdominal aneurysm without mention of rupture 11/09/2011    Past Surgical History:  Procedure Laterality Date  . ABDOMINAL AORTIC ANEURYSM REPAIR  12/09/2011   Procedure: ANEURYSM ABDOMINAL AORTIC REPAIR;  Surgeon: Mal Misty, MD;  Location: [facility];  Service: Vascular;  Laterality: N/A;  resection and grafting abdominal aortic aneurysm; Insertion Bilateral External Iliac Graft; Reexploration Proximal Anastemosis; Aortic Endarterectomy; Reimplantation Inferior Mesenteric Artery; Intraaortic Graft  . [place]  . APPENDECTOMY     1960  . BACK SURGERY     1990's  . [place]  &  08/2008  . CAROTID ENDARTERECTOMY Left 09/2008   Archie Endo 02/03/2011  . CESAREAN SECTION    . EYE SURGERY Right May 2016   Pt. fell  . FOOT HARDWARE  REMOVAL Left 11/12/2005   Archie Endo 02/17/2011  . FRACTURE SURGERY     left ankle   . HEMIARTHROPLASTY HIP Right 01/2007   Archie Endo 02/17/2011  . JOINT REPLACEMENT    . LUMBAR LAMINECTOMY  10/18/2012   Archie Endo 10/18/2012  . MENISCUS REPAIR Right 11/12/2005   Partial medial and lateral Archie Endo 02/17/2011  . ORIF ANKLE FRACTURE Left 01/2010   Archie Endo 01/22/2010  . TOE FUSION Left 02/2000   Fusion and alignment of the first metatarsal medial cuneiform joint./notes 05/02/2018  . TOTAL HIP ARTHROPLASTY Left 04/23/2018   Procedure: ANTERIOR LEFT HIP HEMIARTHROPALSTY;  Surgeon: Leandrew Koyanagi, MD;  Location: [facility];  Service: Orthopedics;  Laterality: Left;  . TOTAL KNEE ARTHROPLASTY Right 02/2008   Archie Endo 02/05/2011     OB History   None      Home Medications    Prior to Admission medications   Medication Sig Start Date End Date Taking? Authorizing Provider  bumetanide (BUMEX) 2 MG tablet Take 1 tablet (2 mg total) by mouth every morning. 05/10/18  Yes Nita Sells, MD  dipyridamole-aspirin (AGGRENOX) 25-200 MG per 12 hr capsule Take 1 capsule by mouth 2 (two) times daily.    Yes [provider]  hydrocortisone (CORTEF) 5 MG tablet Take 5-15 mg by mouth daily. Take 3 tablets (15 mg) by mouth every morning and 1 tablet (5 mg) at bedtime   Yes [provider]  levothyroxine (SYNTHROID, LEVOTHROID) 75 MCG tablet Take 75 mcg by mouth daily before breakfast.    Yes [provider]  acetaminophen (TYLENOL) 325 MG tablet Take 2 tablets (650 mg total) by mouth every 6 (six) hours. Patient not taking: Reported on 05/17/2018 05/10/18   Nita Sells, MD  atenolol (TENORMIN) 50 MG tablet Take 50 mg by mouth daily.     [provider]  bumetanide (BUMEX) 1 MG tablet Take 1 tablet (1 mg total) by mouth every evening. Patient not taking: Reported on 05/17/2018 05/10/18   Nita Sells, MD  diltiazem (CARDIZEM CD) 180 MG 24 hr capsule Take 1 capsule (180 mg total)  by mouth every evening. Patient not taking: Reported on 05/17/2018 05/10/18   Nita Sells, MD  enoxaparin (LOVENOX) 30 MG/0.3ML injection Inject 0.3 mLs (30 mg total) into the skin daily for 20 days. End date 8/13 Patient not taking: Reported on 05/17/2018 05/10/18 05/30/18  Nita Sells, MD  hydrALAZINE (APRESOLINE) 50 MG tablet Take 1 tablet (50 mg total) by mouth every 8 (eight) hours. Patient not taking: Reported on 05/17/2018 04/26/18   Thurnell Lose, MD  potassium chloride SA (K-DUR,KLOR-CON) 20 MEQ tablet Take 2 tablets (40 mEq total) by mouth 3 (three) times daily. Patient not taking: Reported on 05/17/2018 05/10/18   Nita Sells, MD    Family History Family History  Problem Relation Age of Onset  . Heart disease Mother   . Peripheral  vascular disease Mother   . Hypertension Mother   . Varicose Veins Mother   . Alcohol abuse Father   . Varicose Veins Father   . Heart disease Sister   . Heart disease Brother     Social History Social History   Tobacco Use  . Smoking status: Former Smoker    Packs/day: 0.50    Years: 4.00    Pack years: 2.00    Types: Cigarettes    Last attempt to quit: 10/11/1961    Years since quitting: 56.6  . Smokeless tobacco: Never Used  Substance Use Topics  . Alcohol use: No    Alcohol/week: 0.0 standard drinks  . Drug use: No     Allergies   Codeine; Dilaudid [hydromorphone hcl]; Lincomycin hcl; Macrodantin; Zolpidem tartrate; Azithromycin; and Penicillins   Review of Systems Review of Systems Unable to obtain due to the patient's altered mental status.  Physical Exam Updated Vital Signs BP (!) 123/48   Pulse (!) 57   Temp 100.2 F (37.9 C) (Oral)   Resp (!) 21   SpO2 100%   Physical Exam Physical Exam Constitutional  Nursing notes reviewed  Vital signs reviewed  HEENT  No obvious trauma  Supple without meningismus, mass, or overt JVD  Pupils 27mm and PERRL  No scleral icterus or injection    Respiratory  Sonorous respirations  Bilateral rhonchi  CV  Normal rate  No obvious murmurs  Trace pitting edema  Abdomen  Soft  Non-tender  Non-distended  No peritonitis  MSK  Atraumatic  No obvious deformity  Left hip incision is non-erythematous, no pus, no crepitus  Skin  Warm  Dry  Neuro  Responsive only to pain  Non verbal  Moving all extremities to pain  Resting tremor  Pupils 54mm and PERRL  [place] reflex normal  Psychiatric  Unable to assess        ED Treatments / Results  Labs (all labs ordered are listed, but only abnormal results are displayed) Labs Reviewed  COMPREHENSIVE METABOLIC PANEL - Abnormal; Notable for the following components:      Result Value   Sodium 150 (*)    Potassium 6.1 (*)    Chloride 113 (*)    BUN 60 (*)    Creatinine, Ser 6.33 (*)    Calcium 8.3 (*)    Albumin 2.9 (*)    Alkaline Phosphatase 137 (*)    GFR calc non Af Amer 6 (*)    GFR calc Af Amer 6 (*)    All other components within normal limits  CBC WITH DIFFERENTIAL/PLATELET - Abnormal; Notable for the following components:   WBC 16.9 (*)    MCV 107.7 (*)    MCHC 28.1 (*)    RDW 17.4 (*)    Platelets 142 (*)    Neutro Abs 11.3 (*)    Monocytes Absolute 1.7 (*)    Basophils Absolute 0.2 (*)    All other components within normal limits  URINALYSIS, ROUTINE W REFLEX MICROSCOPIC - Abnormal; Notable for the following components:   APPearance TURBID (*)    Hgb urine dipstick SMALL (*)    Ketones, ur 5 (*)    Protein, ur 100 (*)    Leukocytes, UA SMALL (*)    WBC, UA >50 (*)    Bacteria, UA MANY (*)    All other components within normal limits  I-STAT CG4 LACTIC ACID, ED - Abnormal; Notable for the following components:   Lactic Acid, Venous 2.87 (*)  All other components within normal limits  I-STAT CG4 LACTIC ACID, ED - Abnormal; Notable for the following components:   Lactic Acid, Venous 3.14 (*)    All other components within normal  limits  I-STAT CHEM 8, ED - Abnormal; Notable for the following components:   Sodium 148 (*)    Potassium 5.5 (*)    Chloride 112 (*)    BUN 61 (*)    Creatinine, Ser 6.10 (*)    Glucose, Bld 122 (*)    Calcium, Ion 0.95 (*)    Hemoglobin 11.6 (*)    HCT 34.0 (*)    All other components within normal limits  CULTURE, BLOOD (ROUTINE X 2)  CULTURE, BLOOD (ROUTINE X 2)  PROTIME-INR  CORTISOL    EKG None  Radiology Ct Abdomen Pelvis Wo Contrast  Result Date: 05/17/2018 CLINICAL DATA:  Worsening shortness of breath after injury. Fever. History of abdominal aortic aneurysm repair, lumbar spine surgery, RIGHT hip surgery, hysterectomy, appendectomy. EXAM: CT ABDOMEN AND PELVIS WITHOUT CONTRAST TECHNIQUE: Multidetector CT imaging of the abdomen and pelvis was performed following the standard protocol without IV contrast. COMPARISON:  CT lumbar spine April 17, 2013 FINDINGS: Moderate respiratory motion degraded examination. LOWER CHEST: Dependent atelectasis, potential small RIGHT pleural effusion. Included heart size is mildly enlarged. No pericardial effusion. HEPATOBILIARY: State layering gallbladder sludge versus tiny stones without CT findings of acute cholecystitis. Negative included non-contrast CT liver. PANCREAS: Normal. SPLEEN: Normal. ADRENALS/URINARY TRACT: Kidneys are orthotopic, demonstrating normal size and morphology. No nephrolithiasis, hydronephrosis; limited assessment for renal masses by nonenhanced CT. Multiple exophytic probable cysts measuring to 22 mm. The unopacified ureters are normal in course and caliber. Urinary bladder is partially distended and unremarkable. Normal adrenal glands. STOMACH/BOWEL: Small hiatal hernia. The stomach, small and large bowel are normal in course and caliber without inflammatory changes, sensitivity decreased by lack of enteric contrast. VASCULAR/LYMPHATIC: Severe calcific atherosclerosis aortoiliac vessels of branch vessels. Status post  aortoiliac repair. No lymphadenopathy by CT size criteria. REPRODUCTIVE: Status post hysterectomy. OTHER: No intraperitoneal free fluid or free air. MUSCULOSKELETAL: Streak artifact from bilateral hip arthroplasties. Possible RIGHT inferior pubic ramus fracture the limited by streak artifact. Osteopenia. Old moderate T12 and L2 compression fractures with further height loss from 2014. Old sacral fracture. PLIF and posterior decompression. Limited assessment for rib fracture due to respiratory motion. Anterior abdominal wall scarring. Small fat containing umbilical hernia. IMPRESSION: 1. Moderately motion degraded examination. No acute intra-abdominal/pelvic process. 2. Age indeterminate suspected RIGHT inferior pubic ramus fracture. Old lumbosacral fractures. Aortic Atherosclerosis (ICD10-I70.0). Electronically Signed   By: Elon Alas M.D.   On: 05/17/2018 23:03   Ct Head Wo Contrast  Result Date: 05/17/2018 CLINICAL DATA:  Hip injury altered mental status EXAM: CT HEAD WITHOUT CONTRAST TECHNIQUE: Contiguous axial images were obtained from the base of the skull through the vertex without intravenous contrast. COMPARISON:  04/24/2018 head CT, MRI 12/08/2011 FINDINGS: Brain: No acute territorial infarction, hemorrhage or intracranial mass. Chronic left occipital infarct. Moderate atrophy. Moderate to marked small vessel ischemic changes of the white matter. Stable ventricle size. Vascular: No hyperdense vessels. Carotid vascular calcification and vertebral artery calcification Skull: No fracture Sinuses/Orbits: No acute finding. Other: None IMPRESSION: 1. No CT evidence for acute intracranial abnormality. 2. Atrophy and small vessel ischemic changes of the white matter. Chronic left occipital infarct Electronically Signed   By: Donavan Foil M.D.   On: 05/17/2018 22:55   Dg Chest Portable 1 View  Result Date: 05/18/2018 CLINICAL DATA:  Follow-up central line placement EXAM: PORTABLE CHEST 1 VIEW  COMPARISON:  Film from earlier in the same day. FINDINGS: Right jugular central line is been withdrawn and now lies at the cavoatrial junction. No pneumothorax is seen. Mild patchy atelectatic changes are noted in the left base. No other focal abnormality is noted. IMPRESSION: Interval withdrawal of right jugular catheter. Mild left basilar atelectasis is seen. Electronically Signed   By: Inez Catalina M.D.   On: 05/18/2018 02:51   Dg Chest Portable 1 View  Result Date: 05/18/2018 CLINICAL DATA:  Central line placement. EXAM: PORTABLE CHEST 1 VIEW COMPARISON:  Chest radiograph May 17, 2018 FINDINGS: RIGHT internal jugular central venous catheter distal tip projects in proximal atrium. Stable cardiomegaly. Tortuous calcified aorta. Mild chronic interstitial changes without pleural effusion or focal consolidation. Minimal LEFT lung base scarring. No pneumothorax. Osteopenia. IMPRESSION: 1. RIGHT internal jugular central venous catheter distal tip projects in proximal RIGHT atrium, recommend approximately 2 cm retraction. No pneumothorax. 2. Stable cardiomegaly. 3.  Aortic Atherosclerosis (ICD10-I70.0). Electronically Signed   By: Elon Alas M.D.   On: 05/18/2018 02:24   Dg Chest Portable 1 View  Result Date: 05/17/2018 CLINICAL DATA:  Dyspnea EXAM: PORTABLE CHEST 1 VIEW COMPARISON:  05/07/2018 chest radiograph. FINDINGS: Stable cardiomediastinal silhouette with top-normal heart size. No pneumothorax. No pleural effusion. No pulmonary edema. Improved aeration at the lung bases, with mild residual scarring versus atelectasis at the left lung base. IMPRESSION: Improved aeration at the lung bases, with mild residual scarring versus atelectasis at the left lung base. No definite residual pleural effusions. Electronically Signed   By: Ilona Sorrel M.D.   On: 05/17/2018 19:58    Procedures .Central Line Date/Time: 05/18/2018 3:35 AM Performed by: Alford Highland, MD Authorized by: Alford Highland, MD    Consent:    Consent obtained:  Written   Consent given by:  Spouse   Risks discussed:  Arterial puncture, bleeding, infection, nerve damage, pneumothorax and incorrect placement   Alternatives discussed:  No treatment and observation Pre-procedure details:    Hand hygiene: Hand hygiene performed prior to insertion     Sterile barrier technique: All elements of maximal sterile technique followed     Skin preparation:  ChloraPrep   Skin preparation agent: Skin preparation agent completely dried prior to procedure   Procedure details:    Location:  R internal jugular   Patient position:  Trendelenburg   Procedural supplies:  Triple lumen   Catheter size:  7.5 Fr   Landmarks identified: yes     Ultrasound guidance: yes     Sterile ultrasound techniques: Sterile gel and sterile probe covers were used     Number of attempts:  2   Successful placement: yes   Post-procedure details:    Post-procedure:  Dressing applied and line sutured   Assessment:  Blood return through all ports, free fluid flow, no pneumothorax on x-ray and placement verified by x-ray   Patient tolerance of procedure:  Tolerated well, no immediate complications Comments:     Line repositioned based off initial chest xray ARTERIAL LINE Date/Time: 05/18/2018 3:36 AM Performed by: Alford Highland, MD Authorized by: Alford Highland, MD   Consent:    Consent obtained:  Written   Consent given by:  Spouse   Risks discussed:  Bleeding, infection, pain and repeat procedure Pre-procedure details:    Skin preparation:  2% Chlorhexidine Procedure details:    Location:  R radial   Needle gauge:  18 G   Placement  technique:  Ultrasound guided and Seldinger   Number of attempts:  2   Transducer: dampened amplitude   Comments:     The patient dislodged the line after being placed   (including critical care time)  Medications Ordered in ED Medications  vancomycin variable dose per unstable renal function (pharmacist  dosing) (has no administration in time range)  ceFEPIme (MAXIPIME) 1 g in sodium chloride 0.9 % 100 mL IVPB (has no administration in time range)  norepinephrine (LEVOPHED) 4mg  in D5W 254mL premix infusion (has no administration in time range)  0.9 %  sodium chloride infusion (has no administration in time range)  fentaNYL (SUBLIMAZE) 100 MCG/2ML injection (has no administration in time range)  0.9 %  sodium chloride infusion (has no administration in time range)  heparin injection 5,000 Units (has no administration in time range)  hydrocortisone sodium succinate (SOLU-CORTEF) 100 MG injection 50 mg (has no administration in time range)  lactated ringers bolus 1,000 mL (0 mLs Intravenous Stopped 05/17/18 2123)    And  lactated ringers bolus 1,000 mL (0 mLs Intravenous Stopped 05/17/18 2123)    And  lactated ringers bolus 250 mL (0 mLs Intravenous Stopped 05/17/18 2101)  piperacillin-tazobactam (ZOSYN) IVPB 3.375 g (0 g Intravenous Stopped 05/17/18 2127)  vancomycin (VANCOCIN) 1,500 mg in sodium chloride 0.9 % 500 mL IVPB (0 mg Intravenous Stopped 05/17/18 2326)  calcium gluconate 1 g in sodium chloride 0.9 % 100 mL IVPB (0 g Intravenous Stopped 05/17/18 2321)  dextrose 10 % infusion ( Intravenous New Bag/Given 05/17/18 2336)  insulin aspart (novoLOG) injection 5 Units (5 Units Intravenous Given 05/17/18 2257)  hydrocortisone sodium succinate (SOLU-CORTEF) 100 MG injection 50 mg (50 mg Intravenous Given 05/18/18 0100)     Initial Impression / Assessment and Plan / ED Course  I have reviewed the triage vital signs and the nursing notes.  Pertinent labs & imaging results that were available during my care of the patient were reviewed by me and considered in my medical decision making (see chart for details).     Nike T Meenach is a critically ill 82 year old female.  She presents with altered mental status and some respiratory distress.  She is febrile and borderline tachycardic.  There are no  obvious sources of infection.  However, she did have a recent hip replacement.  The incision site is well-appearing and there are no signs of crepitus.  Code sepsis was initiated.  She is given 30 cc/kg fluid.  She is also started on vancomycin and Zosyn.  Zosyn was later switched to cefepime after her kidney function resulted.  She is maintaining but not protecting her airway.  Due to her being DNR, the husband does not wish to pursue aggressive airway interventions.  Her lactic acid is elevated to 3.  Her UA is highly concerning for a urinary tract infection.  She has a leukocytosis to 16.9.  She is hypernatremic with sodium of 150 and hyperkalemic with a potassium of 6.1.  Her BUN is also elevated at 60 and her creatinine is elevated to 6.3.  She is in acute renal failure.  She was given 5 units of IV insulin and started on dextrose.  Through additional chart review, it appears that she may have Sheehan syndrome.  She was given 100 mg of Solu-Cortef.  She became hypotensive in the ED, with her pressures consistently with systolics in the 69C.  I discussed with the husband her critical status.  He would like for her to receive  a trial of critical care. She would not want to be mechanically ventilated.  She was started on peripheral levophed.  A right IJ central line was placed without difficulty.  A right radial arterial line was placed.  She had a pulsatile waveform with this but the waveform was abnormal and the arterial line was accidentally dislodged.  She was admitted to the critical care service. Final Clinical Impressions(s) / ED Diagnoses   Final diagnoses:  Septic shock ([place])  Acute renal failure, unspecified acute renal failure type [facility])  Hyperkalemia    ED Discharge Orders    None       Alford Highland, MD 05/18/18 2824    Elnora Morrison, MD 05/22/18 [phone]

## 2018-05-17 NOTE — Assessment & Plan Note (Addendum)
See 05/17/2018: Creatinine 5.6, BUN 60.8, GFR less than 15, potassium 6.7 Discussed with her husband as to options; ie, rehospitalization versus trial  IV fluid and Kayexalate with close monitor of electrolytes.DNR reconfirmed.  He elected treatment here rather than return to the hospital.

## 2018-05-17 NOTE — ED Triage Notes (Signed)
Pt arrived from [facility] after being their for rib for a hip injury per EMS. EMS called out for AMS and increased sob. EMS gave 5mg  nebulizer albuterol. Initial bp 80/30 with says in the 70s. Pt responsive to IV stick, breathing labored. Husband and Dr.Ashburn at bedside

## 2018-05-17 NOTE — Assessment & Plan Note (Signed)
05/17/2018 blood pressure low Decrease atenolol, hold hydralazine

## 2018-05-17 NOTE — Assessment & Plan Note (Addendum)
05/16/18 potassium 7.5; KCl 20 mEq 2 pills 3 times daily discontinued BMP revealed potassium of 6.7; Kayexalate 15 g 3 times daily initiated with potassium monitor.

## 2018-05-17 NOTE — Assessment & Plan Note (Addendum)
Repeat chest x-ray revealed resolution of bilateral effusions Continue diuretic

## 2018-05-18 ENCOUNTER — Emergency Department (HOSPITAL_COMMUNITY): Payer: Medicare Other

## 2018-05-18 DIAGNOSIS — Z515 Encounter for palliative care: Secondary | ICD-10-CM | POA: Diagnosis present

## 2018-05-18 DIAGNOSIS — I739 Peripheral vascular disease, unspecified: Secondary | ICD-10-CM | POA: Diagnosis not present

## 2018-05-18 DIAGNOSIS — R918 Other nonspecific abnormal finding of lung field: Secondary | ICD-10-CM | POA: Diagnosis not present

## 2018-05-18 DIAGNOSIS — R401 Stupor: Secondary | ICD-10-CM

## 2018-05-18 DIAGNOSIS — J90 Pleural effusion, not elsewhere classified: Secondary | ICD-10-CM | POA: Diagnosis present

## 2018-05-18 DIAGNOSIS — E870 Hyperosmolality and hypernatremia: Secondary | ICD-10-CM | POA: Diagnosis present

## 2018-05-18 DIAGNOSIS — F0390 Unspecified dementia without behavioral disturbance: Secondary | ICD-10-CM | POA: Diagnosis present

## 2018-05-18 DIAGNOSIS — R4182 Altered mental status, unspecified: Secondary | ICD-10-CM | POA: Diagnosis not present

## 2018-05-18 DIAGNOSIS — S32591A Other specified fracture of right pubis, initial encounter for closed fracture: Secondary | ICD-10-CM | POA: Diagnosis present

## 2018-05-18 DIAGNOSIS — D696 Thrombocytopenia, unspecified: Secondary | ICD-10-CM | POA: Diagnosis present

## 2018-05-18 DIAGNOSIS — I6522 Occlusion and stenosis of left carotid artery: Secondary | ICD-10-CM | POA: Diagnosis present

## 2018-05-18 DIAGNOSIS — R6520 Severe sepsis without septic shock: Secondary | ICD-10-CM | POA: Diagnosis not present

## 2018-05-18 DIAGNOSIS — N179 Acute kidney failure, unspecified: Secondary | ICD-10-CM

## 2018-05-18 DIAGNOSIS — Z9189 Other specified personal risk factors, not elsewhere classified: Secondary | ICD-10-CM

## 2018-05-18 DIAGNOSIS — K449 Diaphragmatic hernia without obstruction or gangrene: Secondary | ICD-10-CM

## 2018-05-18 DIAGNOSIS — E039 Hypothyroidism, unspecified: Secondary | ICD-10-CM | POA: Diagnosis not present

## 2018-05-18 DIAGNOSIS — R6521 Severe sepsis with septic shock: Secondary | ICD-10-CM

## 2018-05-18 DIAGNOSIS — R404 Transient alteration of awareness: Secondary | ICD-10-CM | POA: Diagnosis not present

## 2018-05-18 DIAGNOSIS — J690 Pneumonitis due to inhalation of food and vomit: Secondary | ICD-10-CM | POA: Diagnosis not present

## 2018-05-18 DIAGNOSIS — J9601 Acute respiratory failure with hypoxia: Secondary | ICD-10-CM | POA: Diagnosis present

## 2018-05-18 DIAGNOSIS — D539 Nutritional anemia, unspecified: Secondary | ICD-10-CM | POA: Diagnosis present

## 2018-05-18 DIAGNOSIS — N183 Chronic kidney disease, stage 3 (moderate): Secondary | ICD-10-CM

## 2018-05-18 DIAGNOSIS — I714 Abdominal aortic aneurysm, without rupture: Secondary | ICD-10-CM | POA: Diagnosis not present

## 2018-05-18 DIAGNOSIS — R0600 Dyspnea, unspecified: Secondary | ICD-10-CM | POA: Diagnosis not present

## 2018-05-18 DIAGNOSIS — E875 Hyperkalemia: Secondary | ICD-10-CM | POA: Diagnosis not present

## 2018-05-18 DIAGNOSIS — X58XXXA Exposure to other specified factors, initial encounter: Secondary | ICD-10-CM | POA: Diagnosis present

## 2018-05-18 DIAGNOSIS — R0602 Shortness of breath: Secondary | ICD-10-CM | POA: Diagnosis not present

## 2018-05-18 DIAGNOSIS — Z743 Need for continuous supervision: Secondary | ICD-10-CM | POA: Diagnosis not present

## 2018-05-18 DIAGNOSIS — E2740 Unspecified adrenocortical insufficiency: Secondary | ICD-10-CM | POA: Diagnosis present

## 2018-05-18 DIAGNOSIS — E230 Hypopituitarism: Secondary | ICD-10-CM | POA: Diagnosis present

## 2018-05-18 DIAGNOSIS — Z66 Do not resuscitate: Secondary | ICD-10-CM | POA: Diagnosis present

## 2018-05-18 DIAGNOSIS — G92 Toxic encephalopathy: Secondary | ICD-10-CM | POA: Diagnosis present

## 2018-05-18 DIAGNOSIS — E1159 Type 2 diabetes mellitus with other circulatory complications: Secondary | ICD-10-CM | POA: Diagnosis not present

## 2018-05-18 DIAGNOSIS — R279 Unspecified lack of coordination: Secondary | ICD-10-CM | POA: Diagnosis not present

## 2018-05-18 DIAGNOSIS — I10 Essential (primary) hypertension: Secondary | ICD-10-CM | POA: Diagnosis not present

## 2018-05-18 DIAGNOSIS — A419 Sepsis, unspecified organism: Secondary | ICD-10-CM

## 2018-05-18 DIAGNOSIS — Z452 Encounter for adjustment and management of vascular access device: Secondary | ICD-10-CM | POA: Diagnosis not present

## 2018-05-18 DIAGNOSIS — L8990 Pressure ulcer of unspecified site, unspecified stage: Secondary | ICD-10-CM | POA: Diagnosis present

## 2018-05-18 DIAGNOSIS — N184 Chronic kidney disease, stage 4 (severe): Secondary | ICD-10-CM | POA: Diagnosis present

## 2018-05-18 DIAGNOSIS — J9811 Atelectasis: Secondary | ICD-10-CM | POA: Diagnosis present

## 2018-05-18 DIAGNOSIS — D631 Anemia in chronic kidney disease: Secondary | ICD-10-CM | POA: Diagnosis not present

## 2018-05-18 DIAGNOSIS — E1151 Type 2 diabetes mellitus with diabetic peripheral angiopathy without gangrene: Secondary | ICD-10-CM | POA: Diagnosis present

## 2018-05-18 DIAGNOSIS — N170 Acute kidney failure with tubular necrosis: Secondary | ICD-10-CM | POA: Diagnosis present

## 2018-05-18 DIAGNOSIS — N390 Urinary tract infection, site not specified: Secondary | ICD-10-CM

## 2018-05-18 DIAGNOSIS — R1310 Dysphagia, unspecified: Secondary | ICD-10-CM | POA: Diagnosis present

## 2018-05-18 DIAGNOSIS — F0280 Dementia in other diseases classified elsewhere without behavioral disturbance: Secondary | ICD-10-CM | POA: Diagnosis not present

## 2018-05-18 DIAGNOSIS — K429 Umbilical hernia without obstruction or gangrene: Secondary | ICD-10-CM | POA: Diagnosis not present

## 2018-05-18 DIAGNOSIS — A4159 Other Gram-negative sepsis: Secondary | ICD-10-CM | POA: Diagnosis not present

## 2018-05-18 DIAGNOSIS — Z7189 Other specified counseling: Secondary | ICD-10-CM | POA: Diagnosis not present

## 2018-05-18 DIAGNOSIS — E1122 Type 2 diabetes mellitus with diabetic chronic kidney disease: Secondary | ICD-10-CM | POA: Diagnosis present

## 2018-05-18 LAB — GLUCOSE, CAPILLARY
GLUCOSE-CAPILLARY: 131 mg/dL — AB (ref 70–99)
GLUCOSE-CAPILLARY: 134 mg/dL — AB (ref 70–99)
GLUCOSE-CAPILLARY: 136 mg/dL — AB (ref 70–99)
GLUCOSE-CAPILLARY: 138 mg/dL — AB (ref 70–99)
GLUCOSE-CAPILLARY: 148 mg/dL — AB (ref 70–99)
Glucose-Capillary: 172 mg/dL — ABNORMAL HIGH (ref 70–99)

## 2018-05-18 LAB — PROCALCITONIN: PROCALCITONIN: 0.82 ng/mL

## 2018-05-18 LAB — RENAL FUNCTION PANEL
ALBUMIN: 2.5 g/dL — AB (ref 3.5–5.0)
ALBUMIN: 2.4 g/dL — AB (ref 3.5–5.0)
Anion gap: 12 (ref 5–15)
Anion gap: 12 (ref 5–15)
BUN: 55 mg/dL — AB (ref 8–23)
BUN: 53 mg/dL — AB (ref 8–23)
CALCIUM: 7.7 mg/dL — AB (ref 8.9–10.3)
CHLORIDE: 111 mmol/L (ref 98–111)
CO2: 23 mmol/L (ref 22–32)
CO2: 22 mmol/L (ref 22–32)
Calcium: 7.4 mg/dL — ABNORMAL LOW (ref 8.9–10.3)
Chloride: 112 mmol/L — ABNORMAL HIGH (ref 98–111)
Creatinine, Ser: 5.88 mg/dL — ABNORMAL HIGH (ref 0.44–1.00)
Creatinine, Ser: 5.52 mg/dL — ABNORMAL HIGH (ref 0.44–1.00)
GFR calc Af Amer: 7 mL/min — ABNORMAL LOW (ref >60)
GFR calc Af Amer: 8 mL/min — ABNORMAL LOW (ref >60)
GFR calc non Af Amer: 6 mL/min — ABNORMAL LOW (ref >60)
GFR calc non Af Amer: 7 mL/min — ABNORMAL LOW (ref >60)
GLUCOSE: 158 mg/dL — AB (ref 70–99)
Glucose, Bld: 175 mg/dL — ABNORMAL HIGH (ref 70–99)
PHOSPHORUS: 5 mg/dL — AB (ref 2.5–4.6)
POTASSIUM: 5.2 mmol/L — AB (ref 3.5–5.1)
Phosphorus: 5 mg/dL — ABNORMAL HIGH (ref 2.5–4.6)
Potassium: 4.3 mmol/L (ref 3.5–5.1)
SODIUM: 147 mmol/L — AB (ref 135–145)
Sodium: 145 mmol/L (ref 135–145)

## 2018-05-18 LAB — CBC
HCT: 36.1 % (ref 36.0–46.0)
Hemoglobin: 10.5 g/dL — ABNORMAL LOW (ref 12.0–15.0)
MCH: 30.6 pg (ref 26.0–34.0)
MCHC: 29.1 g/dL — ABNORMAL LOW (ref 30.0–36.0)
MCV: 105.2 fL — AB (ref 78.0–100.0)
PLATELETS: 121 10*3/uL — AB (ref 150–400)
RBC: 3.43 MIL/uL — AB (ref 3.87–5.11)
RDW: 17.1 % — ABNORMAL HIGH (ref 11.5–15.5)
WBC: 13.6 10*3/uL — AB (ref 4.0–10.5)

## 2018-05-18 LAB — I-STAT CHEM 8, ED
BUN: 61 mg/dL — ABNORMAL HIGH (ref 8–23)
CHLORIDE: 112 mmol/L — AB (ref 98–111)
Calcium, Ion: 0.95 mmol/L — ABNORMAL LOW (ref 1.15–1.40)
Creatinine, Ser: 6.1 mg/dL — ABNORMAL HIGH (ref 0.44–1.00)
Glucose, Bld: 122 mg/dL — ABNORMAL HIGH (ref 70–99)
HEMATOCRIT: 34 % — AB (ref 36.0–46.0)
Hemoglobin: 11.6 g/dL — ABNORMAL LOW (ref 12.0–15.0)
Potassium: 5.5 mmol/L — ABNORMAL HIGH (ref 3.5–5.1)
SODIUM: 148 mmol/L — AB (ref 135–145)
TCO2: 24 mmol/L (ref 22–32)

## 2018-05-18 LAB — TROPONIN I: Troponin I: 0.07 ng/mL (ref <0.03)

## 2018-05-18 LAB — POCT I-STAT 3, ART BLOOD GAS (G3+)
Acid-base deficit: 1 mmol/L (ref 0.0–2.0)
Bicarbonate: 24.5 mmol/L (ref 20.0–28.0)
O2 Saturation: 100 %
PH ART: 7.35 (ref 7.350–7.450)
PO2 ART: 257 mmHg — AB (ref 83.0–108.0)
Patient temperature: 96.3
TCO2: 26 mmol/L (ref 22–32)
pCO2 arterial: 43.9 mmHg (ref 32.0–48.0)

## 2018-05-18 LAB — CORTISOL

## 2018-05-18 LAB — MRSA PCR SCREENING: MRSA by PCR: NEGATIVE

## 2018-05-18 LAB — MAGNESIUM: Magnesium: 1.9 mg/dL (ref 1.7–2.4)

## 2018-05-18 LAB — LACTIC ACID, PLASMA: LACTIC ACID, VENOUS: 1.6 mmol/L (ref 0.5–1.9)

## 2018-05-18 LAB — SAVE SMEAR

## 2018-05-18 MED ORDER — DEXTROSE 5 % IV SOLN
500.0000 mg | INTRAVENOUS | Status: DC
  Administered 2018-05-18 – 2018-05-20 (×3): 500 mg via INTRAVENOUS
  Filled 2018-05-18 (×4): qty 0.5

## 2018-05-18 MED ORDER — DEXTROSE-NACL 5-0.45 % IV SOLN
INTRAVENOUS | Status: DC
  Administered 2018-05-18: 05:00:00 via INTRAVENOUS

## 2018-05-18 MED ORDER — HYDROCORTISONE NA SUCCINATE PF 100 MG IJ SOLR
50.0000 mg | Freq: Once | INTRAMUSCULAR | Status: AC
  Administered 2018-05-18: 50 mg via INTRAVENOUS

## 2018-05-18 MED ORDER — HEPARIN SODIUM (PORCINE) 5000 UNIT/ML IJ SOLN
5000.0000 [IU] | Freq: Three times a day (TID) | INTRAMUSCULAR | Status: DC
  Administered 2018-05-18 – 2018-05-22 (×13): 5000 [IU] via SUBCUTANEOUS
  Filled 2018-05-18 (×13): qty 1

## 2018-05-18 MED ORDER — SODIUM CHLORIDE 0.9 % IV SOLN: 250.0000 mL | INTRAVENOUS | Status: DC | PRN

## 2018-05-18 MED ORDER — NOREPINEPHRINE 4 MG/250ML-% IV SOLN
0.0000 ug/min | Freq: Once | INTRAVENOUS | Status: AC
  Administered 2018-05-18: 20 ug/min via INTRAVENOUS
  Filled 2018-05-18 (×2): qty 250

## 2018-05-18 MED ORDER — PIPERACILLIN-TAZOBACTAM IN DEX 2-0.25 GM/50ML IV SOLN
2.2500 g | Freq: Three times a day (TID) | INTRAVENOUS | Status: DC
  Administered 2018-05-18: 2.25 g via INTRAVENOUS
  Filled 2018-05-18 (×2): qty 50

## 2018-05-18 MED ORDER — FENTANYL CITRATE (PF) 100 MCG/2ML IJ SOLN
INTRAMUSCULAR | Status: AC
  Administered 2018-05-18: 50 ug
  Filled 2018-05-18: qty 2

## 2018-05-18 MED ORDER — INSULIN ASPART 100 UNIT/ML ~~LOC~~ SOLN
0.0000 [IU] | SUBCUTANEOUS | Status: DC
  Administered 2018-05-18: 2 [IU] via SUBCUTANEOUS
  Administered 2018-05-18 – 2018-05-19 (×8): 1 [IU] via SUBCUTANEOUS
  Administered 2018-05-19: 2 [IU] via SUBCUTANEOUS
  Administered 2018-05-20 – 2018-05-21 (×3): 1 [IU] via SUBCUTANEOUS
  Administered 2018-05-21: 2 [IU] via SUBCUTANEOUS

## 2018-05-18 MED ORDER — LEVOTHYROXINE SODIUM 100 MCG IV SOLR
37.5000 ug | Freq: Every day | INTRAVENOUS | Status: DC
  Administered 2018-05-18 – 2018-05-21 (×3): 37.5 ug via INTRAVENOUS
  Filled 2018-05-18 (×5): qty 5

## 2018-05-18 MED ORDER — SODIUM CHLORIDE 0.9 % IV SOLN
INTRAVENOUS | Status: DC | PRN
  Administered 2018-05-18: 05:00:00 via INTRA_ARTERIAL

## 2018-05-18 MED ORDER — HYDROCORTISONE NA SUCCINATE PF 100 MG IJ SOLR
50.0000 mg | Freq: Four times a day (QID) | INTRAMUSCULAR | Status: DC
  Administered 2018-05-18 – 2018-05-19 (×5): 50 mg via INTRAVENOUS
  Filled 2018-05-18 (×5): qty 2

## 2018-05-18 MED ORDER — DEXTROSE 5 % IV SOLN
INTRAVENOUS | Status: DC
  Administered 2018-05-18 (×2): via INTRAVENOUS

## 2018-05-18 NOTE — H&P (Signed)
PULMONARY / CRITICAL CARE MEDICINE   Name: Katie Pace MRN: [id number] DOB: Aug 28, 1936    ADMISSION DATE:  05/17/2018 CONSULTATION DATE:  05/18/2018  REFERRING MD:  Dr. Drue Flirt  CHIEF COMPLAINT:  Hypotension/ sepsis  HISTORY OF PRESENT ILLNESS:   HPI obtained from medical chart review and per patient's husband at bedside as patient is encephalopathic.  82 year old female with PMH significant for but not limited to Dementia, DNR/ DNI, GERD, HTN, DM, CKD stage 4, anemia, hypothyroidism, PVD, Sheehan syndrome, CVA, s/p AAA repair, endarterectomy, and s/p left hip arthroplasty 7/20 after fall presenting from [facility] after development of altered mental status since 8/12 and since has been non-verbal, and desaturations into the 60's% requiring O2.  On arrival, tmax 100.3 hemodynamically stable but had some hypotension which responded to 2L of VF.  Labs noted for WBC 16.9, LA 2.87- 3.14, sCr 6.33 up from baseline 1.52, K of 6.7, Na 151, Hgb 11.6.  CXR showing some mild left atelectatic changes, CT head negative and CT abd/pelvis no acute findings but study was motion degraded.  UA noted with small leukocytes, many bacteria, > 50 bacteria. She was empirically started on zosyn (no adverse reaction given allergy) and vancomycin.  Hyperkalemia was treated with calcium, insulin, D10,  With repeat K of 5.5.  ER MD spoke with husband at bedside who wishes for current medical treatment and is currently ok with the use of vasopressors if needed but otherwise DNR/ DNI.  Patient has not required vasopressor support, however PCCM to admit to ICU.   PAST MEDICAL HISTORY :  She  has a past medical history of AAA (abdominal aortic aneurysm) ([place]), Anemia, Arthritis, Blood transfusion, C. 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  PAST SURGICAL HISTORY: She  has a past surgical history that includes Carotid endarterectomy (Left, 09/2008); Appendectomy; Cesarean section; Abdominal hysterectomy (1973); Fracture surgery; Abdominal aortic aneurysm repair (12/09/2011); Eye surgery (Right, Katie 2016); Total hip arthroplasty (Left, 04/23/2018); Back surgery; Toe Fusion (Left, 02/2000); Foot hardware removal (Left, 11/12/2005); Meniscus repair (Right, 11/12/2005); Hemiarthroplasty hip (Right, 01/2007); Total knee arthroplasty (Right, 02/2008); Joint replacement; Cardiac catheterization (1980  &  08/2008); ORIF ankle fracture (Left, 01/2010); and Lumbar laminectomy (10/18/2012).  Allergies  Allergen Reactions  . Codeine Nausea Only  . Dilaudid [Hydromorphone Hcl] Nausea Only  . Lincomycin Hcl Other (See Comments)    Big bumps bilateral legs  . Macrodantin Hives  . Zolpidem Tartrate Other (See Comments)    Stroke  . Azithromycin Rash  . Penicillins Rash    Has patient had a PCN reaction causing immediate rash, facial/tongue/throat swelling, SOB or lightheadedness with hypotension: Yes Has patient had a PCN reaction causing severe rash involving mucus membranes or skin necrosis: No Has patient had a PCN reaction that required hospitalization: No Has patient had a PCN reaction occurring within the last 10 years: No If all of the above answers are "NO", then Katie proceed with Cephalosporin use.    No current facility-administered medications on file prior to encounter.    Current Outpatient Medications on File Prior to Encounter  Medication Sig  . bumetanide (BUMEX) 2 MG tablet Take 1 tablet (2 mg total) by mouth every morning.  . dipyridamole-aspirin (AGGRENOX) 25-200 MG per 12 hr capsule Take 1 capsule by mouth 2 (two) times daily.   . hydrocortisone (CORTEF) 5 MG  tablet Take 5-15 mg by mouth daily. Take 3 tablets (15 mg) by mouth every morning and 1 tablet (5 mg) at bedtime  .  levothyroxine (SYNTHROID, LEVOTHROID) 75 MCG tablet Take 75 mcg by mouth daily before breakfast.   . acetaminophen (TYLENOL) 325 MG tablet Take 2 tablets (650 mg total) by mouth every 6 (six) hours. (Patient not taking: Reported on 05/17/2018)  . atenolol (TENORMIN) 50 MG tablet Take 50 mg by mouth daily.   . bumetanide (BUMEX) 1 MG tablet Take 1 tablet (1 mg total) by mouth every evening. (Patient not taking: Reported on 05/17/2018)  . diltiazem (CARDIZEM CD) 180 MG 24 hr capsule Take 1 capsule (180 mg total) by mouth every evening. (Patient not taking: Reported on 05/17/2018)  . enoxaparin (LOVENOX) 30 MG/0.3ML injection Inject 0.3 mLs (30 mg total) into the skin daily for 20 days. End date 8/13 (Patient not taking: Reported on 05/17/2018)  . hydrALAZINE (APRESOLINE) 50 MG tablet Take 1 tablet (50 mg total) by mouth every 8 (eight) hours. (Patient not taking: Reported on 05/17/2018)  . potassium chloride SA (K-DUR,KLOR-CON) 20 MEQ tablet Take 2 tablets (40 mEq total) by mouth 3 (three) times daily. (Patient not taking: Reported on 05/17/2018)    FAMILY HISTORY:  Her family history includes Alcohol abuse in her father; Heart disease in her brother, mother, and sister; Hypertension in her mother; Peripheral vascular disease in her mother; Varicose Veins in her father and mother.  SOCIAL HISTORY: She  reports that she quit smoking about 56 years ago. Her smoking use included cigarettes. She has a 2.00 pack-year smoking history. She has never used smokeless tobacco. She reports that she does not drink alcohol or use drugs.  REVIEW OF SYSTEMS:   Unable to assess as patient is encephalopathic  SUBJECTIVE:   VITAL SIGNS: BP (!) 140/43   Pulse 61   Temp 100.2 F (37.9 C) (Oral)   Resp (!) 22   SpO2 98%   HEMODYNAMICS:    VENTILATOR SETTINGS:    INTAKE / OUTPUT: No intake/output data recorded.  PHYSICAL EXAMINATION: General:  Chronically ill elderly female lying in bed in NAD HEENT: pupils  3/ reactive, anicteric, extremely dry mm Neuro: minimally responsive, moans occasionally, has attempted to localize, minimal reaction to painful stimuli CV: rrr, murmur PULM: periods of short intermittent apnea episodes in my presence with one episode of desaturation, otherwise stable on 3.5L [place] at 98%, breath sounds coarse, diminished in bases GI/ GU: obese, soft, non-tender, bs+, purwick cath in place Extremities: warm/dry, no edema  Skin: no rashes  Musculoskeletal: left hip with sutures intact, mild erythema at site, no drainage  LABS:  BMET Recent Labs  Lab 05/17/18 05/17/18 1938 05/18/18 0038  NA 151* 150* 148*  K 6.7* 6.1* 5.5*  CL  --  113* 112*  CO2  --  22  --   BUN 61* 60* 61*  CREATININE 5.6* 6.33* 6.10*  GLUCOSE  --  95 122*    Electrolytes Recent Labs  Lab 05/17/18 1938  CALCIUM 8.3*    CBC Recent Labs  Lab 05/17/18 1938 05/18/18 0038  WBC 16.9*  --   HGB 12.6 11.6*  HCT 44.8 34.0*  PLT 142*  --     Coag's Recent Labs  Lab 05/17/18 2025  INR 1.13    Sepsis Markers Recent Labs  Lab 05/17/18 1940 05/17/18 2203  LATICACIDVEN 2.87* 3.14*    ABG No results for input(s): PHART, PCO2ART, PO2ART in the last 168 hours.  Liver Enzymes Recent Labs  Lab 05/17/18 1938  AST 22  ALT 12  ALKPHOS 137*  BILITOT 0.7  ALBUMIN 2.9*    Cardiac Enzymes No results for input(s): TROPONINI, PROBNP in the last 168 hours.  Glucose No results for input(s): GLUCAP in the last 168 hours.  STUDIES:  CT head 8/13 >> 1. No CT evidence for acute intracranial abnormality. 2. Atrophy and small vessel ischemic changes of the white matter.  Chronic left occipital infarct  CT abd/ pelvis wo contrast 8/13 >> 1. Moderately motion degraded examination. No acute intra-abdominal/pelvic process. 2. Age indeterminate suspected RIGHT inferior pubic ramus fracture.  Old lumbosacral fractures.  Aortic Atherosclerosis  CULTURES: 8/13 BC x 2 >> 8/14 UC >> 8/14 MRSA  PCR >>  ANTIBIOTICS: 8/13 vanc >> 8/13 zosyn x 1 8/14 cefepime >>  SIGNIFICANT EVENTS: 8/13 presented/ admitted   LINES/TUBES: PIV 8/14 R IJ TL >>  DISCUSSION: 9 yoF DNR/ DNI presenting from rehab with AMS, unable to eat, and hypoxia since 8/12.  Hypotension in ER responsive to IVF.  Being treated for sepsis, urinary at this point, AKI, hyperkalemia.  Husband is ok with vasopressors at this time.    ASSESSMENT / PLAN:  Sepsis/ septic shock related to UTI - previous UTI citrobacter koseri treated in July with rocephin switched to keflex stop date 7/25 - on daily hydrocortisone at baseline, ? Some component of adrenal insufficiency since she has not been able to take meds for 2 days - last CVP 9 P:  Admit to ICU Levophed for map goal > 65 CVP q 4, goal 8-10 Continue solucortef 50mg  q 6, follow pending cortisol level  Trend lactic  Follow blood and urine cultures Continue vanc and cefepime for now  Acute encephalopathy/ hx Dementia- patient normal verbal - most likely related to component of sepsis P:  Treat underlying infection as above Monitor Consider PMT if fails to improve with supportive care Hold sedating meds  AKI on CKD stage 4 Hyperkalemia  Hypernatremia - free water deficit 1743 ml Lactic acidosis  - s/p chemical tx, with repeat K 5.5 P:  Insert foley D51/2 NS at 75 ml Repeat BMET at 1600  Given hx, doubtful pt would be a candidate for CRRT Trend BMP/ mag/ phos/ daily wt / urinary output  Hypoxia High risk for aspiration - periods of apnea  - CXR with left atelectatic changes P:  Continue supplemental O2 as needed  Supportive care DNI NPO  GERD/ dysphagia  P:  NPO for now  Panhypopituitarism/ Hypothyroidism- tsh 0.905 on 7/21 P:  Synthroid half dose PO dose IV - 37.5 mcg daily while NPO  DM P:  CBG q 4 Add SSI if needed   S/p  left hip arthroplasty 7/20 for femoral neck fx s/p mechanical fall by Dr. Erlinda Hong with [facility] orthopedics  -  Past due for suture removal  P:  Will remove sutures   [place] anemia -stable hgb P:  monitor  DVT prophylaxis: SCDs, heparin  SUP: not indicated Diet: NPO Activity: bedrest  Disposition : ICU for now, if remains stable, transfer to SDU   FAMILY  - Updates: Husband updated at bedside per Dr. Carson Myrtle  - Inter-disciplinary family meet or Palliative Care meeting due by:  Ongoing    Kennieth Rad, AGACNP-BC [place] Pgr: [phone] or if no answer [phone] 05/18/2018, 4:23 AM

## 2018-05-18 NOTE — Progress Notes (Signed)
ABG ordered for patient.  ABG was obtained on 100% non-rebreather mask.  No further instructions at this time.     Ref. Range 05/18/2018 10:28  Sample type Unknown ARTERIAL  pH, Arterial Latest Ref Range: 7.350 - 7.450  7.350  pCO2 arterial Latest Ref Range: 32.0 - 48.0 mmHg 43.9  pO2, Arterial Latest Ref Range: 83.0 - 108.0 mmHg 257.0 (H)  TCO2 Latest Ref Range: 22 - 32 mmol/L 26  Acid-base deficit Latest Ref Range: 0.0 - 2.0 mmol/L 1.0  Bicarbonate Latest Ref Range: 20.0 - 28.0 mmol/L 24.5  O2 Saturation Latest Units: % 100.0  Patient temperature Unknown 96.3 F  Collection site Unknown RADIAL, ALLEN'S TEST ACCEPTABLE

## 2018-05-18 NOTE — Progress Notes (Signed)
Pharmacy Antibiotic Note  Katie Pace is a 82 y.o. female admitted on 05/17/2018 with sepsis.  Pharmacy has been consulted for Zosyn dosing for urinary source vs aspiration PNA. Noted PCN allergy, MD aware, tolerated Zosyn in the ED, MD wishes to continue with Zosyn.   Plan: Zosyn 2.25g IV q8h Already on Vancomycin Trend WBC, temp, renal function  F/U infectious work-up Drug levels as indicated   Weight: 146 lb 2.6 oz (66.3 kg)  Temp (24hrs), Avg:98.1 F (36.7 C), Min:96.7 F (35.9 C), Max:100.2 F (37.9 C)  Recent Labs  Lab 05/17/18 05/17/18 1938 05/17/18 1940 05/17/18 2203 05/18/18 0038 05/18/18 0446  WBC  --  16.9*  --   --   --  13.6*  CREATININE 5.6* 6.33*  --   --  6.10* 5.88*  LATICACIDVEN  --   --  2.87* 3.14*  --  1.6    Estimated Creatinine Clearance: 7.1 mL/min (A) (by C-G formula based on SCr of 5.88 mg/dL (H)).    Allergies  Allergen Reactions  . Codeine Nausea Only  . Dilaudid [Hydromorphone Hcl] Nausea Only  . Lincomycin Hcl Other (See Comments)    Big bumps bilateral legs  . Macrodantin Hives  . Zolpidem Tartrate Other (See Comments)    Stroke  . Azithromycin Rash  . Penicillins Rash    Has patient had a PCN reaction causing immediate rash, facial/tongue/throat swelling, SOB or lightheadedness with hypotension: Yes Has patient had a PCN reaction causing severe rash involving mucus membranes or skin necrosis: No Has patient had a PCN reaction that required hospitalization: No Has patient had a PCN reaction occurring within the last 10 years: No If all of the above answers are "NO", then may proceed with Cephalosporin use.    Narda Bonds 05/18/2018 6:54 AM

## 2018-05-18 NOTE — Progress Notes (Signed)
13 sutures removed from left hip without complication.  Incision healed and remained approximated.  No drainage.

## 2018-05-18 NOTE — Plan of Care (Signed)
  Problem: Education: Goal: Knowledge of General Education information will improve Description Including pain rating scale, medication(s)/side effects and non-pharmacologic comfort measures Outcome: Progressing   Problem: Clinical Measurements: Goal: Ability to maintain clinical measurements within normal limits will improve Outcome: Progressing Goal: Will remain free from infection Outcome: Progressing Goal: Diagnostic test results will improve Outcome: Progressing Goal: Respiratory complications will improve Outcome: Progressing Goal: Cardiovascular complication will be avoided Outcome: Progressing   Problem: Elimination: Goal: Will not experience complications related to bowel motility Outcome: Progressing Goal: Will not experience complications related to urinary retention Outcome: Progressing   Problem: Skin Integrity: Goal: Risk for impaired skin integrity will decrease Outcome: Progressing

## 2018-05-18 NOTE — Progress Notes (Signed)
Dr Chase Caller informed of troponin .07

## 2018-05-18 NOTE — Progress Notes (Signed)
Quick rounding note  S: patient seen seveal hours ago today by admit PCCM team. BAseline crear July 2019 - 1.7 - 2.1mg %. aDmite creat 6.3 and improved to 5.8 RN reports ongoing obtundation, Husband at bedside. Of pressors but obtundation continues. DNI, NO CPR. Ok for ACLS meds    O Frail Obtunded Face mask on Mild resp distress Abd soft  Antibiotics Given (last 72 hours)    Date/Time Action Medication Dose Rate   05/17/18 2057 New Bag/Given   piperacillin-tazobactam (ZOSYN) IVPB 3.375 g 3.375 g 100 mL/hr   05/17/18 2126 New Bag/Given   vancomycin (VANCOCIN) 1,500 mg in sodium chloride 0.9 % 500 mL IVPB 1,500 mg 250 mL/hr   05/18/18 0443 New Bag/Given   ceFEPIme (MAXIPIME) 1 g in sodium chloride 0.9 % 100 mL IVPB 1 g 200 mL/hr   05/18/18 0718 New Bag/Given   piperacillin-tazobactam (ZOSYN) IVPB 2.25 g 2.25 g 100 mL/hr           PULMONARY Recent Labs  Lab 05/18/18 0038 05/18/18 1028  PHART  --  7.350  PCO2ART  --  43.9  PO2ART  --  257.0*  HCO3  --  24.5  TCO2 24 26  O2SAT  --  100.0    CBC Recent Labs  Lab 05/17/18 1938 05/18/18 0038 05/18/18 0446  HGB 12.6 11.6* 10.5*  HCT 44.8 34.0* 36.1  WBC 16.9*  --  13.6*  PLT 142*  --  121*    COAGULATION Recent Labs  Lab 05/17/18 2025  INR 1.13    CARDIAC   Recent Labs  Lab 05/18/18 0620  TROPONINI 0.07*   No results for input(s): PROBNP in the last 168 hours.   CHEMISTRY Recent Labs  Lab 05/17/18 05/17/18 1938 05/18/18 0038 05/18/18 0446  NA 151* 150* 148* 147*  K 6.7* 6.1* 5.5* 5.2*  CL  --  113* 112* 112*  CO2  --  22  --  23  GLUCOSE  --  95 122* 175*  BUN 61* 60* 61* 55*  CREATININE 5.6* 6.33* 6.10* 5.88*  CALCIUM  --  8.3*  --  7.7*  MG  --   --   --  1.9  PHOS  --   --   --  5.0*   Estimated Creatinine Clearance: 7.1 mL/min (A) (by C-G formula based on SCr of 5.88 mg/dL (H)).   LIVER Recent Labs  Lab 05/17/18 1938 05/17/18 2025 05/18/18 0446  AST 22  --   --   ALT  12  --   --   ALKPHOS 137*  --   --   BILITOT 0.7  --   --   PROT 6.5  --   --   ALBUMIN 2.9*  --  2.5*  INR  --  1.13  --      INFECTIOUS Recent Labs  Lab 05/17/18 1940 05/17/18 2203 05/18/18 0446  LATICACIDVEN 2.87* 3.14* 1.6     ENDOCRINE CBG (last 3)  Recent Labs    05/18/18 0443 05/18/18 0730  GLUCAP 148* 172*         IMAGING x48h  - image(s) personally visualized  -   highlighted in bold Ct Abdomen Pelvis Wo Contrast  Result Date: 05/17/2018 CLINICAL DATA:  Worsening shortness of breath after injury. Fever. History of abdominal aortic aneurysm repair, lumbar spine surgery, RIGHT hip surgery, hysterectomy, appendectomy. EXAM: CT ABDOMEN AND PELVIS WITHOUT CONTRAST TECHNIQUE: Multidetector CT imaging of the abdomen and pelvis was performed following the standard  protocol without IV contrast. COMPARISON:  CT lumbar spine April 17, 2013 FINDINGS: Moderate respiratory motion degraded examination. LOWER CHEST: Dependent atelectasis, potential small RIGHT pleural effusion. Included heart size is mildly enlarged. No pericardial effusion. HEPATOBILIARY: State layering gallbladder sludge versus tiny stones without CT findings of acute cholecystitis. Negative included non-contrast CT liver. PANCREAS: Normal. SPLEEN: Normal. ADRENALS/URINARY TRACT: Kidneys are orthotopic, demonstrating normal size and morphology. No nephrolithiasis, hydronephrosis; limited assessment for renal masses by nonenhanced CT. Multiple exophytic probable cysts measuring to 22 mm. The unopacified ureters are normal in course and caliber. Urinary bladder is partially distended and unremarkable. Normal adrenal glands. STOMACH/BOWEL: Small hiatal hernia. The stomach, small and large bowel are normal in course and caliber without inflammatory changes, sensitivity decreased by lack of enteric contrast. VASCULAR/LYMPHATIC: Severe calcific atherosclerosis aortoiliac vessels of branch vessels. Status post aortoiliac  repair. No lymphadenopathy by CT size criteria. REPRODUCTIVE: Status post hysterectomy. OTHER: No intraperitoneal free fluid or free air. MUSCULOSKELETAL: Streak artifact from bilateral hip arthroplasties. Possible RIGHT inferior pubic ramus fracture the limited by streak artifact. Osteopenia. Old moderate T12 and L2 compression fractures with further height loss from 2014. Old sacral fracture. PLIF and posterior decompression. Limited assessment for rib fracture due to respiratory motion. Anterior abdominal wall scarring. Small fat containing umbilical hernia. IMPRESSION: 1. Moderately motion degraded examination. No acute intra-abdominal/pelvic process. 2. Age indeterminate suspected RIGHT inferior pubic ramus fracture. Old lumbosacral fractures. Aortic Atherosclerosis (ICD10-I70.0). Electronically Signed   By: Elon Alas M.D.   On: 05/17/2018 23:03   Ct Head Wo Contrast  Result Date: 05/17/2018 CLINICAL DATA:  Hip injury altered mental status EXAM: CT HEAD WITHOUT CONTRAST TECHNIQUE: Contiguous axial images were obtained from the base of the skull through the vertex without intravenous contrast. COMPARISON:  04/24/2018 head CT, MRI 12/08/2011 FINDINGS: Brain: No acute territorial infarction, hemorrhage or intracranial mass. Chronic left occipital infarct. Moderate atrophy. Moderate to marked small vessel ischemic changes of the white matter. Stable ventricle size. Vascular: No hyperdense vessels. Carotid vascular calcification and vertebral artery calcification Skull: No fracture Sinuses/Orbits: No acute finding. Other: None IMPRESSION: 1. No CT evidence for acute intracranial abnormality. 2. Atrophy and small vessel ischemic changes of the white matter. Chronic left occipital infarct Electronically Signed   By: Donavan Foil M.D.   On: 05/17/2018 22:55   Dg Chest Portable 1 View  Result Date: 05/18/2018 CLINICAL DATA:  Follow-up central line placement EXAM: PORTABLE CHEST 1 VIEW COMPARISON:  Film  from earlier in the same day. FINDINGS: Right jugular central line is been withdrawn and now lies at the cavoatrial junction. No pneumothorax is seen. Mild patchy atelectatic changes are noted in the left base. No other focal abnormality is noted. IMPRESSION: Interval withdrawal of right jugular catheter. Mild left basilar atelectasis is seen. Electronically Signed   By: Inez Catalina M.D.   On: 05/18/2018 02:51   Dg Chest Portable 1 View  Result Date: 05/18/2018 CLINICAL DATA:  Central line placement. EXAM: PORTABLE CHEST 1 VIEW COMPARISON:  Chest radiograph May 17, 2018 FINDINGS: RIGHT internal jugular central venous catheter distal tip projects in proximal atrium. Stable cardiomegaly. Tortuous calcified aorta. Mild chronic interstitial changes without pleural effusion or focal consolidation. Minimal LEFT lung base scarring. No pneumothorax. Osteopenia. IMPRESSION: 1. RIGHT internal jugular central venous catheter distal tip projects in proximal RIGHT atrium, recommend approximately 2 cm retraction. No pneumothorax. 2. Stable cardiomegaly. 3.  Aortic Atherosclerosis (ICD10-I70.0). Electronically Signed   By: Elon Alas M.D.   On: 05/18/2018  02:24   Dg Chest Portable 1 View  Result Date: 05/17/2018 CLINICAL DATA:  Dyspnea EXAM: PORTABLE CHEST 1 VIEW COMPARISON:  05/07/2018 chest radiograph. FINDINGS: Stable cardiomediastinal silhouette with top-normal heart size. No pneumothorax. No pleural effusion. No pulmonary edema. Improved aeration at the lung bases, with mild residual scarring versus atelectasis at the left lung base. IMPRESSION: Improved aeration at the lung bases, with mild residual scarring versus atelectasis at the left lung base. No definite residual pleural effusions. Electronically Signed   By: Ilona Sorrel M.D.   On: 05/17/2018 19:58    Principal Problem:   Septic shock due to urinary tract infection (HCC) Hypernatremia and hyperkalemia Active Problems:   Acute renal failure  superimposed on stage 3 chronic kidney disease (HCC)   Macrocytic anemia   Panhypopituitarism (HCC)   Pressure injury of skin   Hiatal hernia   Altered mental status   Pleural effusion, right   Atelectasis, left   Fracture of right inferior pubic ramus (HCC)   Thrombocytopenia (HCC)   Dementia   At high risk for aspiration   - Shock resolved - AKI/ATN and high Na improving - Encephalopathy continues  Plan Continue bipap prn and o2 Continue abx but dc vanc (AKI and MRSA PCR neg) Continue fluids but change to d5w -> recheck bmet at 7pm Avoid morphine for pain (delirium esp in ATN setting), use fentanyl if needed   Husband upated    Dr. Brand Males, M.D., [facility].C.P Pulmonary and Critical Care Medicine Staff Physician, [place] Director - Interstitial Lung Disease  Program  Pulmonary [place] [facility] [place], [place], [address]  Pager: [phone], If no answer or between  15:00h - 7:00h: call [phone] Telephone: [phone]

## 2018-05-18 NOTE — Progress Notes (Addendum)
Pharmacy Antibiotic Note  Katie Pace is a 82 y.o. female admitted on 05/17/2018 with sepsis. Pharmacy has been consulted for Cefepime dosing after previously being on zosyn and vancomycin.  Pt is from nursing facility with altered mental status and acute on chronic kidney injury. WBC 13.6. LA 1.6. SCr 5.88 (BL ~ 1.52). CrCl < 10 mL/min.    Plan: -Cefepime 500 mg IV Q 24 hours -Monitor CBC, renal fx, cultures and clinical progress   Weight: 146 lb 2.6 oz (66.3 kg)  Temp (24hrs), Avg:98 F (36.7 C), Min:96.3 F (35.7 C), Max:100.2 F (37.9 C)  Recent Labs  Lab 05/17/18 05/17/18 1938 05/17/18 1940 05/17/18 2203 05/18/18 0038 05/18/18 0446  WBC  --  16.9*  --   --   --  13.6*  CREATININE 5.6* 6.33*  --   --  6.10* 5.88*  LATICACIDVEN  --   --  2.87* 3.14*  --  1.6    Estimated Creatinine Clearance: 7.1 mL/min (A) (by C-G formula based on SCr of 5.88 mg/dL (H)).    Allergies  Allergen Reactions  . Codeine Nausea Only  . Dilaudid [Hydromorphone Hcl] Nausea Only  . Lincomycin Hcl Other (See Comments)    Big bumps bilateral legs  . Macrodantin Hives  . Zolpidem Tartrate Other (See Comments)    Stroke  . Azithromycin Rash  . Penicillins Rash    Has patient had a PCN reaction causing immediate rash, facial/tongue/throat swelling, SOB or lightheadedness with hypotension: Yes Has patient had a PCN reaction causing severe rash involving mucus membranes or skin necrosis: No Has patient had a PCN reaction that required hospitalization: No Has patient had a PCN reaction occurring within the last 10 years: No If all of the above answers are "NO", then may proceed with Cephalosporin use.    Antimicrobials this admission: Vanc 8/13 >> 8/14 Zosyn 8/13 >>8/14 Cefepime 8/14 >>   Dose adjustments this admission: None  Microbiology results: 8/13 BCx>> 8/14 MRSA PCR: neg  Thank you for allowing pharmacy to be a part of this patient's care.  Harrietta Guardian, PharmD PGY1  Pharmacy Resident 05/18/2018    12:09 PM  I discussed / reviewed the pharmacy note by Dr. Charlena Cross and I agree with the resident's findings and plans as documented. Will monitor UOP and renal plan.   Sloan Leiter, PharmD, BCPS, BCCCP Clinical Pharmacist Clinical phone 05/18/2018 until 3:30PM [phone] After hours, please call [phone] 05/18/2018, 3:02 PM

## 2018-05-19 ENCOUNTER — Inpatient Hospital Stay (HOSPITAL_COMMUNITY): Payer: Medicare Other

## 2018-05-19 DIAGNOSIS — R6520 Severe sepsis without septic shock: Secondary | ICD-10-CM

## 2018-05-19 LAB — HEPATIC FUNCTION PANEL
ALBUMIN: 2.4 g/dL — AB (ref 3.5–5.0)
ALT: 11 U/L (ref 0–44)
AST: 28 U/L (ref 15–41)
Alkaline Phosphatase: 85 U/L (ref 38–126)
Bilirubin, Direct: 0.2 mg/dL (ref 0.0–0.2)
Indirect Bilirubin: 0.6 mg/dL (ref 0.3–0.9)
Total Bilirubin: 0.8 mg/dL (ref 0.3–1.2)
Total Protein: 5.2 g/dL — ABNORMAL LOW (ref 6.5–8.1)

## 2018-05-19 LAB — CBC WITH DIFFERENTIAL/PLATELET
Abs Immature Granulocytes: 0.1 10*3/uL (ref 0.0–0.1)
BASOS ABS: 0 10*3/uL (ref 0.0–0.1)
Basophils Relative: 0 %
EOS ABS: 0 10*3/uL (ref 0.0–0.7)
EOS PCT: 0 %
HCT: 27.7 % — ABNORMAL LOW (ref 36.0–46.0)
Hemoglobin: 8.2 g/dL — ABNORMAL LOW (ref 12.0–15.0)
IMMATURE GRANULOCYTES: 1 %
Lymphocytes Relative: 6 %
Lymphs Abs: 0.7 10*3/uL (ref 0.7–4.0)
MCH: 30.6 pg (ref 26.0–34.0)
MCHC: 29.6 g/dL — AB (ref 30.0–36.0)
MCV: 103.4 fL — ABNORMAL HIGH (ref 78.0–100.0)
Monocytes Absolute: 0.3 10*3/uL (ref 0.1–1.0)
Monocytes Relative: 3 %
NEUTROS PCT: 90 %
Neutro Abs: 9.9 10*3/uL — ABNORMAL HIGH (ref 1.7–7.7)
Platelets: 105 10*3/uL — ABNORMAL LOW (ref 150–400)
RBC: 2.68 MIL/uL — AB (ref 3.87–5.11)
RDW: 16.4 % — AB (ref 11.5–15.5)
WBC: 11 10*3/uL — AB (ref 4.0–10.5)

## 2018-05-19 LAB — GLUCOSE, CAPILLARY
GLUCOSE-CAPILLARY: 158 mg/dL — AB (ref 70–99)
GLUCOSE-CAPILLARY: 113 mg/dL — AB (ref 70–99)
Glucose-Capillary: 142 mg/dL — ABNORMAL HIGH (ref 70–99)
Glucose-Capillary: 141 mg/dL — ABNORMAL HIGH (ref 70–99)
Glucose-Capillary: 144 mg/dL — ABNORMAL HIGH (ref 70–99)

## 2018-05-19 LAB — BASIC METABOLIC PANEL
ANION GAP: 11 (ref 5–15)
BUN: 50 mg/dL — ABNORMAL HIGH (ref 8–23)
CO2: 23 mmol/L (ref 22–32)
CREATININE: 5.3 mg/dL — AB (ref 0.44–1.00)
Calcium: 7.2 mg/dL — ABNORMAL LOW (ref 8.9–10.3)
Chloride: 111 mmol/L (ref 98–111)
GFR calc non Af Amer: 7 mL/min — ABNORMAL LOW (ref >60)
GFR, EST AFRICAN AMERICAN: 8 mL/min — AB (ref >60)
Glucose, Bld: 151 mg/dL — ABNORMAL HIGH (ref 70–99)
Potassium: 4 mmol/L (ref 3.5–5.1)
SODIUM: 145 mmol/L (ref 135–145)

## 2018-05-19 LAB — ADAMTS13 ACTIVITY REFLEX

## 2018-05-19 LAB — TROPONIN I: Troponin I: 0.06 ng/mL (ref <0.03)

## 2018-05-19 LAB — PHOSPHORUS: PHOSPHORUS: 5.1 mg/dL — AB (ref 2.5–4.6)

## 2018-05-19 LAB — ADAMTS13 ACTIVITY: Adamts 13 Activity: 36.6 % — ABNORMAL LOW (ref >66.8)

## 2018-05-19 LAB — PROCALCITONIN: PROCALCITONIN: 0.84 ng/mL

## 2018-05-19 LAB — MAGNESIUM: MAGNESIUM: 1.7 mg/dL (ref 1.7–2.4)

## 2018-05-19 MED ORDER — HYDROCORTISONE NA SUCCINATE PF 100 MG IJ SOLR
50.0000 mg | Freq: Two times a day (BID) | INTRAMUSCULAR | Status: DC
  Administered 2018-05-19 – 2018-05-21 (×4): 50 mg via INTRAVENOUS
  Filled 2018-05-19 (×4): qty 2

## 2018-05-19 MED ORDER — DEXTROSE IN LACTATED RINGERS 5 % IV SOLN
INTRAVENOUS | Status: DC
  Administered 2018-05-19: 09:00:00 via INTRAVENOUS

## 2018-05-19 MED ORDER — MAGNESIUM SULFATE IN D5W 1-5 GM/100ML-% IV SOLN
1.0000 g | Freq: Once | INTRAVENOUS | Status: AC
  Administered 2018-05-19: 1 g via INTRAVENOUS
  Filled 2018-05-19: qty 100

## 2018-05-19 NOTE — Progress Notes (Signed)
PULMONARY / CRITICAL CARE MEDICINE   Name: Katie Pace MRN: [id number] DOB: October 27, 1935    ADMISSION DATE:  05/17/2018 CONSULTATION DATE:  05/18/2018  REFERRING MD:  Dr. Drue Flirt  CHIEF COMPLAINT:  Hypotension/ sepsis  brief HPI obtained from medical chart review and per patient's husband at bedside as patient is encephalopathic.  82 year old female with PMH significant for but not limited to Dementia, DNR/ DNI, GERD, HTN, DM, CKD stage 4, anemia, hypothyroidism, PVD, Sheehan syndrome, CVA, s/p AAA repair, endarterectomy, and s/p left hip arthroplasty 7/20 after fall presenting from [facility] after development of altered mental status since 8/12 and since has been non-verbal, and desaturations into the 60's% requiring O2.  On arrival, tmax 100.3 hemodynamically stable but had some hypotension which responded to 2L of VF.  Labs noted for WBC 16.9, LA 2.87- 3.14, sCr 6.33 up from baseline 1.52, K of 6.7, Na 151, Hgb 11.6.  CXR showing some mild left atelectatic changes, CT head negative and CT abd/pelvis no acute findings but study was motion degraded.  UA noted with small leukocytes, many bacteria, > 50 bacteria. She was empirically started on zosyn (no adverse reaction given allergy) and vancomycin.  Hyperkalemia was treated with calcium, insulin, D10,  With repeat K of 5.5.  ER MD spoke with husband at bedside who wishes for current medical treatment and is currently ok with the use of vasopressors if needed but otherwise DNR/ DNI.  Patient has not required vasopressor support, however PCCM to admit to ICU.    STUDIES:  CT head 8/13 >> 1. No CT evidence for acute intracranial abnormality. 2. Atrophy and small vessel ischemic changes of the white matter.  Chronic left occipital infarct  CT abd/ pelvis wo contrast 8/13 >> 1. Moderately motion degraded examination. No acute intra-abdominal/pelvic process. 2. Age indeterminate suspected RIGHT inferior pubic ramus fracture.  Old  lumbosacral fractures.  Aortic Atherosclerosis  CULTURES: 8/13 BC x 2 >> 8/14 UC >> 8/14 MRSA PCR >>  ANTIBIOTICS: 8/13 vanc >> 8/13 zosyn x 1 8/14 cefepime >>  SIGNIFICANT EVENTS: 8/13 presented/ admitted   LINES/TUBES: PIV 8/14 R IJ TL >>   SUBJECTIVE:    05/19/2018 - awawke. And not obtudned. Not on vent. Confused though. Not agitated. Per RN and daughte r- waxing and waning alertness . Per Daughter - this is home baseline post fall June 2019. Bed boound since June 2019 fall  Making urine. Not on pressors. Not needed pressors. O2 need went from 6L [place] to 2L [place]. On D5 W at 75cch/h . On cefepime - GNR in urine. At SNF - she is on oral hydrocort at SNF  VITAL SIGNS: BP (!) 165/116   Pulse 62   Temp 98.2 F (36.8 C) (Oral)   Resp 20   Wt 67.8 kg   SpO2 100%   BMI 25.66 kg/m   HEMODYNAMICS:    VENTILATOR SETTINGS:    INTAKE / OUTPUT: I/O last 3 completed shifts: In: 6279.6 [I.V.:2228.9; IV Piggyback:4050.8] Out: 490 [Urine:490]  PHYSICAL EXAMINATION:   General Appearance:    Looks better/ decdontioned , obese, frail  Head:    Normocephalic, without obvious abnormality, atraumatic  Eyes:    PERRL - yes, conjunctiva/corneas - clear      Ears:    Normal external ear canals, both ears  Nose:   NG tube - no but has [place]  Throat:  ETT TUBE - no , OG tube - no  Neck:   Supple,  No  enlargement/tenderness/nodules     Lungs:     Clear to auscultation bilaterally, V  Chest wall:    No deformity  Heart:    S1 and S2 normal, no murmur, CVP - no.  Pressors - no  Abdomen:     Soft, no masses, no organomegaly  Genitalia:    Not done  Rectal:   not done  Extremities:   Extremities- intact     Skin:   Intact in exposed areas .     Neurologic:   Sedation - none -> RASS - +1 to -2 every 5-15 mintues . Moves all 4s - yes. CAM-ICU - + for delirium . Orientation - not orietned - baseline since June 2019 per daughter [name] at bedside       PULMONARY Recent Labs  Lab  05/18/18 0038 05/18/18 1028  PHART  --  7.350  PCO2ART  --  43.9  PO2ART  --  257.0*  HCO3  --  24.5  TCO2 24 26  O2SAT  --  100.0    CBC Recent Labs  Lab 05/17/18 1938 05/18/18 0038 05/18/18 0446 05/19/18 0521  HGB 12.6 11.6* 10.5* 8.2*  HCT 44.8 34.0* 36.1 27.7*  WBC 16.9*  --  13.6* 11.0*  PLT 142*  --  121* 105*    COAGULATION Recent Labs  Lab 05/17/18 2025  INR 1.13    CARDIAC   Recent Labs  Lab 05/18/18 0620 05/19/18 0521  TROPONINI 0.07* 0.06*   No results for input(s): PROBNP in the last 168 hours.   CHEMISTRY Recent Labs  Lab 05/17/18 1938 05/18/18 0038 05/18/18 0446 05/18/18 1837 05/18/18 2352 05/19/18 0521  NA 150* 148* 147* 145 145  --   K 6.1* 5.5* 5.2* 4.3 4.0  --   CL 113* 112* 112* 111 111  --   CO2 22  --  23 22 23   --   GLUCOSE 95 122* 175* 158* 151*  --   BUN 60* 61* 55* 53* 50*  --   CREATININE 6.33* 6.10* 5.88* 5.52* 5.30*  --   CALCIUM 8.3*  --  7.7* 7.4* 7.2*  --   MG  --   --  1.9  --   --  1.7  PHOS  --   --  5.0* 5.0*  --  5.1*   Estimated Creatinine Clearance: 8 mL/min (A) (by C-G formula based on SCr of 5.3 mg/dL (H)).   LIVER Recent Labs  Lab 05/17/18 1938 05/17/18 2025 05/18/18 0446 05/18/18 1837 05/19/18 0521  AST 22  --   --   --  28  ALT 12  --   --   --  11  ALKPHOS 137*  --   --   --  85  BILITOT 0.7  --   --   --  0.8  PROT 6.5  --   --   --  5.2*  ALBUMIN 2.9*  --  2.5* 2.4* 2.4*  INR  --  1.13  --   --   --      INFECTIOUS Recent Labs  Lab 05/17/18 1940 05/17/18 2203 05/18/18 0446 05/18/18 1116 05/19/18 0521  LATICACIDVEN 2.87* 3.14* 1.6  --   --   PROCALCITON  --   --   --  0.82 0.84     ENDOCRINE CBG (last 3)  Recent Labs    05/18/18 2341 05/19/18 0341 05/19/18 0714  GLUCAP 136* 142* 158*     Anti-infectives (From admission, onward)  Start     Dose/Rate Route Frequency Ordered Stop   05/18/18 1400  ceFEPIme (MAXIPIME) 500 mg in dextrose 5 % 50 mL IVPB     500 mg 100  mL/hr over 30 Minutes Intravenous Every 24 hours 05/18/18 1155     05/18/18 0700  piperacillin-tazobactam (ZOSYN) IVPB 2.25 g  Status:  Discontinued     2.25 g 100 mL/hr over 30 Minutes Intravenous Every 8 hours 05/18/18 0658 05/18/18 1155   05/18/18 0500  ceFEPIme (MAXIPIME) 1 g in sodium chloride 0.9 % 100 mL IVPB  Status:  Discontinued     1 g 200 mL/hr over 30 Minutes Intravenous Every 24 hours 05/17/18 2204 05/18/18 0518   05/17/18 2107  vancomycin variable dose per unstable renal function (pharmacist dosing)  Status:  Discontinued      Does not apply See admin instructions 05/17/18 2107 05/18/18 1105   05/17/18 2100  vancomycin (VANCOCIN) 1,500 mg in sodium chloride 0.9 % 500 mL IVPB     1,500 mg 250 mL/hr over 120 Minutes Intravenous  Once 05/17/18 2008 05/17/18 2326   05/17/18 2015  piperacillin-tazobactam (ZOSYN) IVPB 3.375 g     3.375 g 100 mL/hr over 30 Minutes Intravenous  Once 05/17/18 2003 05/17/18 2127   05/17/18 2015  vancomycin (VANCOCIN) IVPB 1000 mg/200 mL premix  Status:  Discontinued     1,000 mg 200 mL/hr over 60 Minutes Intravenous  Once 05/17/18 2003 05/17/18 2008         IMAGING x48h  - image(s) personally visualized  -   highlighted in bold Ct Abdomen Pelvis Wo Contrast  Result Date: 05/17/2018 CLINICAL DATA:  Worsening shortness of breath after injury. Fever. History of abdominal aortic aneurysm repair, lumbar spine surgery, RIGHT hip surgery, hysterectomy, appendectomy. EXAM: CT ABDOMEN AND PELVIS WITHOUT CONTRAST TECHNIQUE: Multidetector CT imaging of the abdomen and pelvis was performed following the standard protocol without IV contrast. COMPARISON:  CT lumbar spine April 17, 2013 FINDINGS: Moderate respiratory motion degraded examination. LOWER CHEST: Dependent atelectasis, potential small RIGHT pleural effusion. Included heart size is mildly enlarged. No pericardial effusion. HEPATOBILIARY: State layering gallbladder sludge versus tiny stones without CT  findings of acute cholecystitis. Negative included non-contrast CT liver. PANCREAS: Normal. SPLEEN: Normal. ADRENALS/URINARY TRACT: Kidneys are orthotopic, demonstrating normal size and morphology. No nephrolithiasis, hydronephrosis; limited assessment for renal masses by nonenhanced CT. Multiple exophytic probable cysts measuring to 22 mm. The unopacified ureters are normal in course and caliber. Urinary bladder is partially distended and unremarkable. Normal adrenal glands. STOMACH/BOWEL: Small hiatal hernia. The stomach, small and large bowel are normal in course and caliber without inflammatory changes, sensitivity decreased by lack of enteric contrast. VASCULAR/LYMPHATIC: Severe calcific atherosclerosis aortoiliac vessels of branch vessels. Status post aortoiliac repair. No lymphadenopathy by CT size criteria. REPRODUCTIVE: Status post hysterectomy. OTHER: No intraperitoneal free fluid or free air. MUSCULOSKELETAL: Streak artifact from bilateral hip arthroplasties. Possible RIGHT inferior pubic ramus fracture the limited by streak artifact. Osteopenia. Old moderate T12 and L2 compression fractures with further height loss from 2014. Old sacral fracture. PLIF and posterior decompression. Limited assessment for rib fracture due to respiratory motion. Anterior abdominal wall scarring. Small fat containing umbilical hernia. IMPRESSION: 1. Moderately motion degraded examination. No acute intra-abdominal/pelvic process. 2. Age indeterminate suspected RIGHT inferior pubic ramus fracture. Old lumbosacral fractures. Aortic Atherosclerosis (ICD10-I70.0). Electronically Signed   By: Elon Alas M.D.   On: 05/17/2018 23:03   Ct Head Wo Contrast  Result Date: 05/17/2018 CLINICAL DATA:  Hip  injury altered mental status EXAM: CT HEAD WITHOUT CONTRAST TECHNIQUE: Contiguous axial images were obtained from the base of the skull through the vertex without intravenous contrast. COMPARISON:  04/24/2018 head CT, MRI  12/08/2011 FINDINGS: Brain: No acute territorial infarction, hemorrhage or intracranial mass. Chronic left occipital infarct. Moderate atrophy. Moderate to marked small vessel ischemic changes of the white matter. Stable ventricle size. Vascular: No hyperdense vessels. Carotid vascular calcification and vertebral artery calcification Skull: No fracture Sinuses/Orbits: No acute finding. Other: None IMPRESSION: 1. No CT evidence for acute intracranial abnormality. 2. Atrophy and small vessel ischemic changes of the white matter. Chronic left occipital infarct Electronically Signed   By: Donavan Foil M.D.   On: 05/17/2018 22:55   Dg Chest Port 1 View  Result Date: 05/19/2018 CLINICAL DATA:  Hypoxia EXAM: PORTABLE CHEST 1 VIEW COMPARISON:  May 18, 2018 FINDINGS: The right central line is stable. The cardiomediastinal silhouette is unchanged. Mild opacity in left base may represent atelectasis or mild infiltrate, unchanged. No other interval changes. IMPRESSION: 1. Mild persistent patchy opacity in left base may represent atelectasis or mild infiltrate. No other change. Electronically Signed   By: Dorise Bullion III M.D   On: 05/19/2018 07:07   Dg Chest Portable 1 View  Result Date: 05/18/2018 CLINICAL DATA:  Follow-up central line placement EXAM: PORTABLE CHEST 1 VIEW COMPARISON:  Film from earlier in the same day. FINDINGS: Right jugular central line is been withdrawn and now lies at the cavoatrial junction. No pneumothorax is seen. Mild patchy atelectatic changes are noted in the left base. No other focal abnormality is noted. IMPRESSION: Interval withdrawal of right jugular catheter. Mild left basilar atelectasis is seen. Electronically Signed   By: Inez Catalina M.D.   On: 05/18/2018 02:51   Dg Chest Portable 1 View  Result Date: 05/18/2018 CLINICAL DATA:  Central line placement. EXAM: PORTABLE CHEST 1 VIEW COMPARISON:  Chest radiograph May 17, 2018 FINDINGS: RIGHT internal jugular central venous  catheter distal tip projects in proximal atrium. Stable cardiomegaly. Tortuous calcified aorta. Mild chronic interstitial changes without pleural effusion or focal consolidation. Minimal LEFT lung base scarring. No pneumothorax. Osteopenia. IMPRESSION: 1. RIGHT internal jugular central venous catheter distal tip projects in proximal RIGHT atrium, recommend approximately 2 cm retraction. No pneumothorax. 2. Stable cardiomegaly. 3.  Aortic Atherosclerosis (ICD10-I70.0). Electronically Signed   By: Elon Alas M.D.   On: 05/18/2018 02:24   Dg Chest Portable 1 View  Result Date: 05/17/2018 CLINICAL DATA:  Dyspnea EXAM: PORTABLE CHEST 1 VIEW COMPARISON:  05/07/2018 chest radiograph. FINDINGS: Stable cardiomediastinal silhouette with top-normal heart size. No pneumothorax. No pleural effusion. No pulmonary edema. Improved aeration at the lung bases, with mild residual scarring versus atelectasis at the left lung base. IMPRESSION: Improved aeration at the lung bases, with mild residual scarring versus atelectasis at the left lung base. No definite residual pleural effusions. Electronically Signed   By: Ilona Sorrel M.D.   On: 05/17/2018 19:58     DISCUSSION: 47 yoF DNR/ DNI presenting from rehab with AMS, unable to eat, and hypoxia since 8/12.  Hypotension in ER responsive to IVF.  Being treated for sepsis, urinary at this point, AKI, hyperkalemia.  Husband is ok with vasopressors at this time.    ASSESSMENT / PLAN:  Sepsis/ septic shock related to GNR UTI - previous UTI citrobacter koseri treated in July with rocephin switched to keflex stop date 7/25 - on daily hydrocortisone at baseline,     - 05/19/2018 - doing  well with cefepime and fluids,. Off pressors x 24h  P:  Continue but reduce solucortef; change to oral when can eat Continue fluids but change to D5 LR at 75cc/h  Acute encephalopathy/ hx Dementia- patient normal verbal - most likely related to component of sepsis  05/19/2018 -  improved but unclear if current is baseline or baseline better  PLAn Support metabolically Avoid benzo/opioids   AKI on CKD stage 4 - improving 05/19/2018  Hyperkalemia  - - s/p chemical tx, with repeat K 5.5 Hypernatremia - free water deficit 1743 ml Lactic acidosis  - resovled   05/19/2018 - imrppving creat. Normal K and Na  P:  Continue foley Continue fluids but aas D5 LR  Hypoxia - acute resp failure High risk for aspiration - periods of apnea  - CXR with left atelectatic changes   05/19/18 - now improved to 2L [place]  P:  Continue supplemental O2 as needed  Supportive care DNI NPO biPAP ok ig gets worse INcentive spiro neeeded but can be tough to comply  GERD/ dysphagia   05/19/18 - CNS status will prevent feeds today/. Daughter keen to feed today  P:  NPO for now - daughter emphasized Can try straw sips under RN supervision    Panhypopituitarism/ Hypothyroidism- tsh 0.905 on 7/21 P:  Synthroid half dose PO dose IV - 37.5 mcg daily while NPO Hydrocort  DM P:  CBG q 4 Add SSI if needed   S/p  left hip arthroplasty 7/20 for femoral neck fx s/p mechanical fall by Dr. Erlinda Hong [facility]  -s/p suture removal by CCM 05/17/2018  P:  monitor   Anemia Recent Labs  Lab 05/17/18 1938 05/18/18 0038 05/18/18 0446 05/19/18 0521  HGB 12.6 11.6* 10.5* 8.2*     P:  - PRBC for hgb </= 6.9gm%    - exceptions are   -  if ACS susepcted/confirmed then transfuse for hgb </= 8.0gm%,  or    -  active bleeding with hemodynamic instability, then transfuse regardless of hemoglobin value   At at all times try to transfuse 1 unit prbc as possible with exception of active hemorrhage    DVT prophylaxis: SCDs, heparin  SUP: not indicated Diet: NPO Activity: bedrest  Disposition : move to med surg 05/19/2018 . CCM off and tRH primary from 05/20/18 - d/w Dr Apolinar Junes of [place]  - Updates: daughter [name] updated at bedside 05/19/2018   GOALS ?  hopsice eligibile - [facility] to sort out from 05/20/18   Dr. Brand Males, M.D., [facility].C.P Pulmonary and Critical Care Medicine Staff Physician, New Market Director - Interstitial Lung Disease  Program  Pulmonary [place] [facility] [place], [place], [address]  Pager: [phone], If no answer or between  15:00h - 7:00h: call [phone] Telephone: [phone]

## 2018-05-19 NOTE — Progress Notes (Signed)
Dr Chase Caller called regarding removal of foley and CVC. Instructed to leave in for now. Pt has hx of urinary retention and difficult sticks.

## 2018-05-19 NOTE — Progress Notes (Signed)
Consult for new PIV start: Pt has triple lumen CVC with 2 lumen in use. Spoke with Vaughan Basta, RN: Hold off on new PIV for now. Critical care MD wants to keep central line in place at this time.

## 2018-05-20 ENCOUNTER — Other Ambulatory Visit: Payer: Self-pay

## 2018-05-20 ENCOUNTER — Inpatient Hospital Stay (HOSPITAL_COMMUNITY): Payer: Medicare Other

## 2018-05-20 DIAGNOSIS — F0390 Unspecified dementia without behavioral disturbance: Secondary | ICD-10-CM

## 2018-05-20 LAB — CBC WITH DIFFERENTIAL/PLATELET
ABS IMMATURE GRANULOCYTES: 0 10*3/uL (ref 0.0–0.1)
Basophils Absolute: 0 10*3/uL (ref 0.0–0.1)
Basophils Relative: 0 %
Eosinophils Absolute: 0 10*3/uL (ref 0.0–0.7)
Eosinophils Relative: 0 %
HEMATOCRIT: 26.9 % — AB (ref 36.0–46.0)
Hemoglobin: 8.2 g/dL — ABNORMAL LOW (ref 12.0–15.0)
IMMATURE GRANULOCYTES: 0 %
LYMPHS ABS: 0.8 10*3/uL (ref 0.7–4.0)
LYMPHS PCT: 11 %
MCH: 30.3 pg (ref 26.0–34.0)
MCHC: 30.5 g/dL (ref 30.0–36.0)
MCV: 99.3 fL (ref 78.0–100.0)
MONOS PCT: 8 %
Monocytes Absolute: 0.6 10*3/uL (ref 0.1–1.0)
NEUTROS ABS: 5.6 10*3/uL (ref 1.7–7.7)
NEUTROS PCT: 81 %
PLATELETS: 120 10*3/uL — AB (ref 150–400)
RBC: 2.71 MIL/uL — ABNORMAL LOW (ref 3.87–5.11)
RDW: 16.5 % — ABNORMAL HIGH (ref 11.5–15.5)
WBC: 7 10*3/uL (ref 4.0–10.5)

## 2018-05-20 LAB — GLUCOSE, CAPILLARY
GLUCOSE-CAPILLARY: 132 mg/dL — AB (ref 70–99)
GLUCOSE-CAPILLARY: 146 mg/dL — AB (ref 70–99)
GLUCOSE-CAPILLARY: 152 mg/dL — AB (ref 70–99)
GLUCOSE-CAPILLARY: 164 mg/dL — AB (ref 70–99)
Glucose-Capillary: 128 mg/dL — ABNORMAL HIGH (ref 70–99)
Glucose-Capillary: 145 mg/dL — ABNORMAL HIGH (ref 70–99)

## 2018-05-20 LAB — BASIC METABOLIC PANEL
Anion gap: 10 (ref 5–15)
BUN: 43 mg/dL — AB (ref 8–23)
CHLORIDE: 109 mmol/L (ref 98–111)
CO2: 23 mmol/L (ref 22–32)
Calcium: 7.7 mg/dL — ABNORMAL LOW (ref 8.9–10.3)
Creatinine, Ser: 3.92 mg/dL — ABNORMAL HIGH (ref 0.44–1.00)
GFR calc Af Amer: 12 mL/min — ABNORMAL LOW (ref >60)
GFR calc non Af Amer: 10 mL/min — ABNORMAL LOW (ref >60)
Glucose, Bld: 134 mg/dL — ABNORMAL HIGH (ref 70–99)
POTASSIUM: 3.2 mmol/L — AB (ref 3.5–5.1)
SODIUM: 142 mmol/L (ref 135–145)

## 2018-05-20 LAB — URINE CULTURE

## 2018-05-20 LAB — PHOSPHORUS: Phosphorus: 4.2 mg/dL (ref 2.5–4.6)

## 2018-05-20 LAB — MAGNESIUM: Magnesium: 2.2 mg/dL (ref 1.7–2.4)

## 2018-05-20 MED ORDER — HYDRALAZINE HCL 20 MG/ML IJ SOLN
2.0000 mg | INTRAMUSCULAR | Status: DC | PRN
  Administered 2018-05-20 – 2018-05-22 (×5): 2 mg via INTRAVENOUS
  Filled 2018-05-20 (×5): qty 1

## 2018-05-20 MED ORDER — CIPROFLOXACIN HCL 500 MG PO TABS
500.0000 mg | ORAL_TABLET | Freq: Every day | ORAL | Status: DC
  Administered 2018-05-21: 500 mg via ORAL
  Filled 2018-05-20: qty 1

## 2018-05-20 MED ORDER — CLONIDINE HCL 0.1 MG PO TABS
0.1000 mg | ORAL_TABLET | Freq: Once | ORAL | Status: AC
  Administered 2018-05-20: 0.1 mg via ORAL
  Filled 2018-05-20: qty 1

## 2018-05-20 MED ORDER — HYDRALAZINE HCL 20 MG/ML IJ SOLN: 10.0000 mg | INTRAMUSCULAR | Status: DC | PRN

## 2018-05-20 NOTE — Progress Notes (Addendum)
[facility] HOSPITALIST PROGRESS NOTE  Katie Pace [id number] DOB: 1936-01-08 DOA: 05/17/2018 PCP: [name], MD   Narrative: 82 y.o. 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  Sepsis from urinary source, hyperkalemia, AKI She was admitted by critical care and transferred to [facility] service 8/16 I know the family pretty well and patient was made DNR last admission  She has done poorly since her index hip surgery 7/20 on return to skilled facility on 8/6 it was noted that she might of had symptoms suggestive of stroke around 8/12 and was not herself at the facility contrary to her discharge condition wherein she was much more oriented     A & Plan Metabolic encephalopathy-acute-probably related to stroke versus sepsis and AKI-family discussed with me that they would not have wanted very aggressive medical care and palliative is delineating goals-MRI has been canceled-debating at this stage further management  High risk of dysphagia and aspiration-speech therapy input appreciated-monitor--has been placed on dysphagia diet knowing that she may aspirate-no tube feeding recommended at this time family agrees  Hypokalemia-would not correct at this juncture given likely poor outcome regardless-hold on labs a.m. Unless family change direction of care  Mild thrombocytopenia-possibly secondary to sepsis  Acute kidney injury creatinine on admission 60/6.3 with potassium of 6 and sodium 150 Now somewhat resolved-lactic acidosis multifactorial probably secondary to sepsis and AKI-monitor clinically at this  juncture  Sepsis-gram-negative rods in urine-Enterobacter-changing cefepime to ciprofloxacin which Enterobacter sensitive to to see if it makes a difference in mentation-if not would discontinue the same  Sheehan synd-giving stress dose steroids Solu-Cortef 50 every 12 for now  Hypothyroidism continue Synthroid 37.5 IV for now  DM TY 2 sugars acceptable 140s to 160s-de-escalate if ultimate goal is palliative in the next couple of days     DVT prophylaxis: Lovenox code Status: Full family Communication: Discussed with daughter and husband at the bedside disposition Plan: Unclear-May benefit from [facility] but can also get palliative at [facility], MD  [facility] Direct contact: [phone] --Via amion app OR  --[email or web address]; password [email or web address]  7PM-7AM contact night coverage as above 05/20/2018, 11:22 AM  LOS: 2 days   Consultants:  pallaitive Care  Procedures:  Multiple  Antimicrobials:  Cefepime to ciprofloxacin  Interval history/Subjective: Awake alert pleasantly confused in no distress  Objective:  Vitals:  Vitals:   05/20/18 0421 05/20/18 1013  BP: (!) 223/198 (!) 209/66  Pulse: 70 68  Resp: 20 20  Temp: 98.2 F (36.8 C) 98 F (36.7 C)  SpO2: 95% 97%    Exam:  Fused and disoriented but pleasant Not in any cardiorespiratory distress S1-S2 no murmur Lung exam limited but in axilla no rales no rhonchi Abdomen soft nontender She arouses but does not make sense and talks gibberish-she does not have hemineglect she is raising her left arm and reacts to noxious stimuli on the right side    I have personally reviewed the following:   Labs:  Potassium 3.2 BUN/creatinine 43/3.9 calcium 7.7  Hemoglobin 8.2 platelet 120  Imaging studies:  None  Medical tests:  No  Test discussed with performing physician:  No  Decision  to obtain old records:  Yes  Review and summation of old records:  No  Scheduled Meds: . ceFEPime  (MAXIPIME) IV  500 mg Intravenous Q24H  . heparin  5,000 Units Subcutaneous Q8H  . hydrocortisone sod succinate (SOLU-CORTEF) inj  50 mg Intravenous Q12H  . insulin aspart  0-9 Units Subcutaneous Q4H  . levothyroxine  37.5 mcg Intravenous Daily   Continuous Infusions: . dextrose 5% lactated ringers 75 mL/hr at 05/19/18 2000    Principal Problem:   Septic shock due to urinary tract infection (HCC) Active Problems:   Acute renal failure superimposed on stage 3 chronic kidney disease (HCC)   Macrocytic anemia   Panhypopituitarism (HCC)   Pressure injury of skin   Hiatal hernia   Altered mental status   Pleural effusion, right   Atelectasis, left   Fracture of right inferior pubic ramus (HCC)   Thrombocytopenia (HCC)   Dementia   At high risk for aspiration   LOS: 2 days

## 2018-05-20 NOTE — Progress Notes (Signed)
BP 224/95 reported to on call provider and requested to hold 1 unit of insulin sliding scale while patient not eating - patient does not want to eat . Will retrieve manual BP for [place], NP and enter in EHR. Also made aware that pt is using abdominal accessory muscles to breathe but oxygen sat is good and pt denies sob. Will continue to monitor pt closely and will administer meds per orders.

## 2018-05-20 NOTE — Consult Note (Signed)
Consultation Note Date: 05/20/2018   Patient Name: Katie Pace  DOB: 1936/07/18  MRN: [id number]  Age / Sex: 82 y.o., female  PCP: [name], MD Referring Physician: Nita Sells, MD  Reason for Consultation: Establishing goals of care and Psychosocial/spiritual support  HPI/Patient Profile: 82 y.o. female  with past medical history of AAA, dementia, CKD IV, PVD, hip surgery 7/20, recurrent hospitalizations for UTI sepsis, sepsis from cellulitis at surgical wound admitted on 05/17/2018 with respiratory failure r/t presumed UTI sepsis. On admission she was in ARF with Cr up to 6.3 (now down to 3.92 with IV fluids), hypotensive requiring IV pressors (off pressors now hypertensive), febrile, and hypoxic (maintaining sats now on RA). Chest xray did show persistent patchy opacity on left base representing atelectasis vs mild infiltrate. Further workup reveals severe dysphagia with significant aspiration and aspiration pna risk. Her spouse requested "trial critical care". Palliative medicine consulted for further [place].    Clinical Assessment and Goals of Care:  I have reviewed medical records including EPIC notes, labs and imaging, assessed the patient and then met at the bedside along with patient daughter Katie Pace, husband Katie Pace, and sister to discuss diagnosis prognosis, GOC, EOL wishes, disposition and options.  We discussed a brief life review of the patient. 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 She prefers to be called "[name]". In her prime she enjoyed cooking and caring for her entire family. She was known for her cakes and lasagna. She enjoyed reading and crafting.  As far as functional, nutritional, an cognitive status- she has had severe declines in all areas over the last few years and severe decline in the last few months since her hip surgery in July. She  was discharged to SNF for rehab after her surgery, but was quickly rehospitalized due to sepsis from surgical site cellulitis. On 8/6 she was discharged from the hospital to [facility] again for rehab- however family reports she has been unable to participate in rehab due to continued cognitive and physical decline. They state she has not eaten much since her surgery and she actually had stopped eating as usual several months ago.    We discussed their current illness and what it means in the larger context of their on-going co-morbidities.  Natural disease trajectory and expectations at EOL were discussed. Katie notes that he does not want her to suffer. All family agree that "we don't see her being able to get better". They do feel she is at end of life and that she would not want to continue to be kept alive in this debilitated state.  I attempted to elicit values and goals of care important to the patient. Losing her independence has been very difficult for her. Katie notes it was very hard for her when he took over the cooking in the house.   The difference between aggressive medical intervention and comfort care was explained.   Advanced directives, concepts specific to code status, artifical  feeding and hydration, and rehospitalization were considered and discussed. Patient has advanced directives and partial code status (bipap ok for now). They would not want her to have artificial feeding. We discussed what would likely be a trajectory of continued rehospitalizations and further decline vs comfort care and natural death with comfort care and symptom management.  Family would like to allow patient to have comfort feeding and drinking. They accept the risk of aspiration.  Patient's daughter and sister were very clear in their desire to transition to comfort measures only- including discontinuing antibiotics and IV fluids, transitioning to residential hospice. However, patient's spouse,  Katie Pace, understandably, would like a little more time to consider options and discuss with other family members. Katie did note that he would prefer [employer].  We discussed the MRI that was ordered to evaluate for stroke and how that would affect her treatment trajectory. All agreed at this point, MRI would not likely make difference in her overall trajectory and therefore will cancel MRI order.  Questions and concerns were addressed.The family was encouraged to call with questions or concerns.   Primary Decision Maker NEXT OF KIN- patient's spouse- Katie Pace    SUMMARY OF RECOMMENDATIONS -Continue current level of care -No feeding tube, cancel MRI -Comfort feeding- will order dysphagia 2 diet per SLP recs, may have comfort feed from floor stock- family accepts risk of aspiration -Family in discussion with other family members this evening re: transitioning to full comfort and residential Hospice- they will let attending or PMT know of decision    Code Status/Advance Care Planning:  Limited code- bipap ok  Palliative Prophylaxis:   Aspiration and Delirium Protocol  Additional Recommendations (Limitations, Scope, Preferences):  Initiate Comfort Feeding  Prognosis:    Unable to determine  Discharge Planning: To Be Determined  Primary Diagnoses: Present on Admission: . Septic shock due to urinary tract infection ([place]) . Acute renal failure superimposed on stage 3 chronic kidney disease ([place]) . Altered mental status . Pleural effusion, right . Atelectasis, left . Fracture of right inferior pubic ramus ([place]) . Thrombocytopenia ([place]) . Dementia . Panhypopituitarism ([place]) . Pressure injury of skin   I have reviewed the medical record, interviewed the patient and family, and examined the patient. The following aspects are pertinent.  Past Medical History:  Diagnosis Date  . AAA (abdominal aortic aneurysm) (HCC)    S/p aortic repair and endarterectomy 12/2011     . Anemia    hx  . Arthritis   . Blood transfusion    back in 1973  . C. difficile diarrhea   . Carotid stenosis, left    S/p left CEA 09/2008  . Diabetes mellitus    type 11  . Endocrine disturbance    gland has not  worked until 1970.Marland Kitchen..dx in 1977  . GERD (gastroesophageal reflux disease)    on meds to help....meds do well   . Heart murmur   . Hypertension   . Hypothyroidism   . Pneumonia    hx  . PVD (peripheral vascular disease) ([place])   . 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 Stroke [facility])    Ischemic x 2       last one 2007   Social History   Socioeconomic History  . Marital status: Married    Spouse name: Not on file  . Number of children: Not on file  . Years of education: Not on file  . Highest education level: Not on file  Occupational History  . Not  on file  Social Needs  . Financial resource strain: Not on file  . Food insecurity:    Worry: Not on file    Inability: Not on file  . Transportation needs:    Medical: Not on file    Non-medical: Not on file  Tobacco Use  . Smoking status: Former Smoker    Packs/day: 0.50    Years: 4.00    Pack years: 2.00    Types: Cigarettes    Last attempt to quit: 10/11/1961    Years since quitting: 56.6  . Smokeless tobacco: Never Used  Substance and Sexual Activity  . Alcohol use: No    Alcohol/week: 0.0 standard drinks  . Drug use: No  . Sexual activity: Not on file  Lifestyle  . Physical activity:    Days per week: Not on file    Minutes per session: Not on file  . Stress: Not on file  Relationships  . Social connections:    Talks on phone: Not on file    Gets together: Not on file    Attends religious service: Not on file    Active member of club or organization: Not on file    Attends meetings of clubs or organizations: Not on file    Relationship status: Not on file  Other Topics Concern  . Not on file  Social History Narrative  . Not on file   Family History  Problem Relation Age of Onset  . Heart  disease Mother   . Peripheral vascular disease Mother   . Hypertension Mother   . Varicose Veins Mother   . Alcohol abuse Father   . Varicose Veins Father   . Heart disease Sister   . Heart disease Brother    Scheduled Meds: . ceFEPime (MAXIPIME) IV  500 mg Intravenous Q24H  . heparin  5,000 Units Subcutaneous Q8H  . hydrocortisone sod succinate (SOLU-CORTEF) inj  50 mg Intravenous Q12H  . insulin aspart  0-9 Units Subcutaneous Q4H  . levothyroxine  37.5 mcg Intravenous Daily   Continuous Infusions: . dextrose 5% lactated ringers 75 mL/hr at 05/20/18 1232   PRN Meds:.hydrALAZINE Medications Prior to Admission:  Prior to Admission medications   Medication Sig Start Date End Date Taking? Authorizing Provider  bumetanide (BUMEX) 2 MG tablet Take 1 tablet (2 mg total) by mouth every morning. 05/10/18  Yes Nita Sells, MD  dipyridamole-aspirin (AGGRENOX) 25-200 MG per 12 hr capsule Take 1 capsule by mouth 2 (two) times daily.    Yes [provider]  hydrocortisone (CORTEF) 5 MG tablet Take 5-15 mg by mouth daily. Take 3 tablets (15 mg) by mouth every morning and 1 tablet (5 mg) at bedtime   Yes [provider]  levothyroxine (SYNTHROID, LEVOTHROID) 75 MCG tablet Take 75 mcg by mouth daily before breakfast.    Yes [provider]  acetaminophen (TYLENOL) 325 MG tablet Take 2 tablets (650 mg total) by mouth every 6 (six) hours. Patient not taking: Reported on 05/17/2018 05/10/18   Nita Sells, MD  atenolol (TENORMIN) 50 MG tablet Take 50 mg by mouth daily.     [provider]  bumetanide (BUMEX) 1 MG tablet Take 1 tablet (1 mg total) by mouth every evening. Patient not taking: Reported on 05/17/2018 05/10/18   Nita Sells, MD  diltiazem (CARDIZEM CD) 180 MG 24 hr capsule Take 1 capsule (180 mg total) by mouth every evening. Patient not taking: Reported on 05/17/2018 05/10/18   Nita Sells, MD  enoxaparin (LOVENOX) 30 MG/0.3ML  injection Inject 0.3 mLs (30 mg total) into the skin daily for 20 days. End date 8/13 Patient not taking: Reported on 05/17/2018 05/10/18 05/30/18  Nita Sells, MD  hydrALAZINE (APRESOLINE) 50 MG tablet Take 1 tablet (50 mg total) by mouth every 8 (eight) hours. Patient not taking: Reported on 05/17/2018 04/26/18   Thurnell Lose, MD  potassium chloride SA (K-DUR,KLOR-CON) 20 MEQ tablet Take 2 tablets (40 mEq total) by mouth 3 (three) times daily. Patient not taking: Reported on 05/17/2018 05/10/18   Nita Sells, MD   Allergies  Allergen Reactions  . Codeine Nausea Only  . Dilaudid [Hydromorphone Hcl] Nausea Only  . Lincomycin Hcl Other (See Comments)    Big bumps bilateral legs  . Macrodantin Hives  . Zolpidem Tartrate Other (See Comments)    Stroke  . Azithromycin Rash  . Penicillins Rash    Has patient had a PCN reaction causing immediate rash, facial/tongue/throat swelling, SOB or lightheadedness with hypotension: Yes Has patient had a PCN reaction causing severe rash involving mucus membranes or skin necrosis: No Has patient had a PCN reaction that required hospitalization: No Has patient had a PCN reaction occurring within the last 10 years: No If all of the above answers are "NO", then may proceed with Cephalosporin use.   Review of Systems  Unable to perform ROS: Dementia    Physical Exam  Constitutional:  frail  Pulmonary/Chest: Effort normal. No respiratory distress.  Abdominal: Soft.  Neurological: She is alert.  Oriented to person, not place or situation, or time, pleasant, unintelligible speech at times Left side neglect  Skin: Skin is warm and dry.  Nursing note and vitals reviewed.   Vital Signs: BP (!) 217/87 (BP Location: Left Arm)   Pulse 76   Temp 97.6 F (36.4 C) (Axillary)   Resp 20   Ht _0  (1.626 m)   Wt 67.8 kg   SpO2 97%   BMI 25.66 kg/m  Pain Scale: PAINAD   Pain Score: 0-No pain   SpO2: SpO2: 97 % O2 Device:SpO2: 97  % O2 Flow Rate: .O2 Flow Rate (L/min): 2 L/min  IO: Intake/output summary:   Intake/Output Summary (Last 24 hours) at 05/20/2018 1500 Last data filed at 05/20/2018 0600 Gross per 24 hour  Intake 485.05 ml  Output 975 ml  Net -489.95 ml    LBM: Last BM Date: 05/19/18 Baseline Weight: Weight: 66.3 kg Most recent weight: Weight: 67.8 kg     Palliative Assessment/Data: PPS: 20%     Thank you for this consult. Palliative medicine will continue to follow and assist as needed.   Time In: 1330 Time Out: 1540 Time Total: 130 minutes Prolonged services billed: yes Greater than 50%  of this time was spent counseling and coordinating care related to the above assessment and plan.  Signed by: Mariana Kaufman, AGNP-C Palliative Medicine    Please contact Palliative Medicine Team phone at [phone] for questions and concerns.  For individual provider: See Shea Evans

## 2018-05-20 NOTE — Progress Notes (Signed)
PT Cancellation Note  Patient Details Name: KENDELLE SCHWEERS MRN: [id number] DOB: [birth year]   Cancelled Treatment:    Reason Eval/Treat Not Completed: Active bedrest order Will need increased activity orders  Lanney Gins, PT, DPT 05/20/18 8:08 AM Pager: [phone]

## 2018-05-20 NOTE — Progress Notes (Signed)
Nurse Tech reported pt BP was elevated SBP ober 200 paged Dr. Verlon Au to inform him of what was going on with pt. Pt has no prn medications ordered for BP. Awaiting response from page. Pt resting in bed daughter and husband at bedside, pt is not noted to be in any distress will continue to monitor pt status and BP.

## 2018-05-20 NOTE — Progress Notes (Addendum)
Pharmacy Antibiotic Note  Katie Katie Pace is a 82 y.o. female admitted on 05/17/2018 with sepsis. Pharmacy has been consulted for Cipro dosing for Enterobacter UTI. Medication can be crushed in puree. SCr trending down, CrCl <30 ml/min. Last dose of cefepime was ~13:00. Today is day 2 of appropriate antibiotics based on sensitivities.  Plan: -Cipro 500 mg PO daily - next tomorrow -Consider total of 7 days of therapy -Pharmacy signing off, please re-consult if needed -Decision support software will be used if dose change is needed   Height: 5\' 4"  (162.6 cm) Weight: 149 lb 7.6 oz (67.8 kg) IBW/kg (Calculated) : 54.7  Temp (24hrs), Avg:98.1 F (36.7 C), Min:97.5 F (36.4 C), Max:98.8 F (37.1 C)  Recent Labs  Lab 05/17/18 1938 05/17/18 1940 05/17/18 2203 05/18/18 0038 05/18/18 0446 05/18/18 1837 05/18/18 2352 05/19/18 0521 05/20/18 0500  WBC 16.9*  --   --   --  13.6*  --   --  11.0* 7.0  CREATININE 6.33*  --   --  6.10* 5.88* 5.52* 5.30*  --  3.92*  LATICACIDVEN  --  2.87* 3.14*  --  1.6  --   --   --   --     Estimated Creatinine Clearance: 10.8 mL/min (A) (by C-G formula based on SCr of 3.92 mg/dL (H)).    Allergies  Allergen Reactions  . Codeine Nausea Only  . Dilaudid [Hydromorphone Hcl] Nausea Only  . Lincomycin Hcl Other (See Comments)    Big bumps bilateral legs  . Macrodantin Hives  . Zolpidem Tartrate Other (See Comments)    Stroke  . Azithromycin Rash  . Penicillins Rash    Has patient had a PCN reaction causing immediate rash, facial/tongue/throat swelling, SOB or lightheadedness with hypotension: Yes Has patient had a PCN reaction causing severe rash involving mucus membranes or skin necrosis: No Has patient had a PCN reaction that required hospitalization: No Has patient had a PCN reaction occurring within the last 10 years: No If all of the above answers are "NO", then may proceed with Cephalosporin use.    Antimicrobials this admission: Vanc 8/13  >> 8/14 Zosyn 8/13 >>8/14 Cefepime 8/14 >> 8/17 Cipro 8/17 >>  Dose adjustments this admission: None  Microbiology results: 8/14 MRSA PCR neg  8/14 Urine >> Enterobacter (R-cefaz/zosyn, I - CTX/macrobid, S-Cipro/Imi/Bactrim/cefepime MIC<1 per unreported lab result) 8/13 BCx >> ngtd  Thank you for allowing pharmacy to be a part of this patient's care.  Renold Genta, PharmD, BCPS Clinical Pharmacist Clinical phone for 05/20/2018 until 10p is [phone] Please check AMION for all Pharmacist numbers by unit 05/20/2018 4:42 PM

## 2018-05-20 NOTE — Evaluation (Signed)
Clinical/Bedside Swallow Evaluation Patient Details  Name: Katie Pace MRN: [id number] Date of Birth: 19-Nov-1935  Today's Date: 05/20/2018 Time: SLP Start Time (ACUTE ONLY): 0753 SLP Stop Time (ACUTE ONLY): 0819 SLP Time Calculation (min) (ACUTE ONLY): 26 min  Past Medical History:  Past Medical History:  Diagnosis Date  . AAA (abdominal aortic aneurysm) (HCC)    S/p aortic repair and endarterectomy 12/2011   . Anemia    hx  . Arthritis   . Blood transfusion    back in 1973  . C. difficile diarrhea   . Carotid stenosis, left    S/p left CEA 09/2008  . Diabetes mellitus    type 11  . Endocrine disturbance    gland has not  worked until 1970.Marland Kitchen..dx in 1977  . GERD (gastroesophageal reflux disease)    on meds to help....meds do well   . Heart murmur   . Hypertension   . Hypothyroidism   . Pneumonia    hx  . PVD (peripheral vascular disease) ([place])   . 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 Stroke [facility])    Ischemic x 2       last one 2007   Past Surgical History:  Past Surgical History:  Procedure Laterality Date  . ABDOMINAL AORTIC ANEURYSM REPAIR  12/09/2011   Procedure: ANEURYSM ABDOMINAL AORTIC REPAIR;  Surgeon: Mal Misty, MD;  Location: [place];  Service: Vascular;  Laterality: N/A;  resection and grafting abdominal aortic aneurysm; Insertion Bilateral External Iliac Graft; Reexploration Proximal Anastemosis; Aortic Endarterectomy; Reimplantation Inferior Mesenteric Artery; Intraaortic Graft  . [place]  . APPENDECTOMY     1960  . BACK SURGERY     1990's  . [place]  &  08/2008  . CAROTID ENDARTERECTOMY Left 09/2008   Archie Endo 02/03/2011  . CESAREAN SECTION    . EYE SURGERY Right May 2016   Pt. fell  . FOOT HARDWARE REMOVAL Left 11/12/2005   Archie Endo 02/17/2011  . FRACTURE SURGERY     left ankle   . HEMIARTHROPLASTY HIP Right 01/2007   Archie Endo 02/17/2011  . JOINT REPLACEMENT    . LUMBAR LAMINECTOMY  10/18/2012   Archie Endo  10/18/2012  . MENISCUS REPAIR Right 11/12/2005   Partial medial and lateral Archie Endo 02/17/2011  . ORIF ANKLE FRACTURE Left 01/2010   Archie Endo 01/22/2010  . TOE FUSION Left 02/2000   Fusion and alignment of the first metatarsal medial cuneiform joint./notes 05/02/2018  . TOTAL HIP ARTHROPLASTY Left 04/23/2018   Procedure: ANTERIOR LEFT HIP HEMIARTHROPALSTY;  Surgeon: Leandrew Koyanagi, MD;  Location: [place];  Service: Orthopedics;  Laterality: Left;  . TOTAL KNEE ARTHROPLASTY Right 02/2008   Archie Endo 02/05/2011   HPI:  82 yo female adm to [facility] with sepsis.  Pt with PMH + for dementia, resident of [facility] SNF, has GERD, occipital lobe CVA.  Swallow evaluation ordered.  CXR showed mild atelectaqsis at base.  Per daughter, pt with decline in function since her last hospital admission.     Assessment / Plan / Recommendation Clinical Impression  Patient presents with clinical indications concerning for oropharyngeal dysphagia.  Subtle throat clearing after delayed swallow noted, concerning for laryngeal penetration.  Pt also with increased WOB with intake which may lead to episodic aspiraiton.  Daughter admits pt with functional decline since last hospital admit.   Pt will not follow commands and demonstrates left visual neglect- daughter states he has been looking to right side since hospital admit -  note CT head negative 05/17/18.  Recommend pursue MBS to allow instrumental evaluation of swallow.  Concerns present for swallow ability given progressive decline.  Spoke to pt's daughter and spouse via phone with approval for MBS.    SLP Visit Diagnosis: Dysphagia, oropharyngeal phase (R13.12)    Aspiration Risk  Moderate aspiration risk    Diet Recommendation NPO        Other  Recommendations   tbd  Follow up Recommendations        Frequency and Duration   tbd         Prognosis   guarded     Swallow Study   General Date of Onset: 05/20/18 HPI: 82 yo female adm to [facility] with sepsis.  Pt with PMH +  for dementia, resident of [facility] SNF, has GERD, occipital lobe CVA.  Swallow evaluation ordered.  CXR showed mild atelectaqsis at base.  Per daughter, pt with decline in function since her last hospital admission.   Type of Study: Bedside Swallow Evaluation Previous Swallow Assessment: none in epic Diet Prior to this Study: NPO Temperature Spikes Noted: No Respiratory Status: Room air History of Recent Intubation: No Behavior/Cognition: Alert;Doesn't follow directions Oral Cavity Assessment: (dentition intact, pt did not allow slp to assess oral cavity) Oral Care Completed by SLP: No Oral Cavity - Dentition: Adequate natural dentition Self-Feeding Abilities: Total assist(hand over hand assist) Patient Positioning: Upright in bed Baseline Vocal Quality: Not observed(daughter reports voice is weak when pt speaks) Volitional Cough: Cognitively unable to elicit Volitional Swallow: Unable to elicit    Oral/Motor/Sensory Function Overall Oral Motor/Sensory Function: Other (comment)(pt did not follow directions,opened mouth to accept boluses, left sided neglect noted)   Ice Chips Ice chips: Impaired Presentation: Spoon Pharyngeal Phase Impairments: Throat Clearing - Immediate   Thin Liquid Thin Liquid: Impaired Presentation: Spoon Pharyngeal  Phase Impairments: Throat Clearing - Immediate;Suspected delayed Swallow    Nectar Thick Nectar Thick Liquid: Impaired Presentation: Spoon;Cup Pharyngeal Phase Impairments: Throat Clearing - Immediate;Suspected delayed Swallow   Honey Thick Honey Thick Liquid: Not tested   Puree Puree: Impaired Presentation: Spoon Pharyngeal Phase Impairments: Suspected delayed Swallow   Solid     Solid: Not tested      Macario Golds 05/20/2018,8:31 AM    Luanna Salk, [place] [facility] SLP [phone]

## 2018-05-20 NOTE — Progress Notes (Addendum)
Modified Barium Swallow Progress Note  Patient Details  Name: Katie Pace MRN: [id number] Date of Birth: 1936/01/09  Today's Date: 05/20/2018  Modified Barium Swallow completed.  Full report located under Chart Review in the Imaging Section.  Brief recommendations include the following:  Clinical Impression  Patient presents with several oral and moderate pharyngeal dysphagia likely due to dementia/cva hx.  Poor attention to tasks noted during testing resulting in pt orally holding boluses.   Premature spillage of boluses into pharynx poorly controlled noted.  Pharyngeal swallow delayed - extensive intermittently noted- despite dry spoon stimulation to aid motor planning/initiation.  Boluses spilled to pyriform sinus prior to pt swallowing.   Aspiration of thin via tsp noted before the swallow.  Pharyngeal swallow is strong without residuals fortunately.  However with pt's delay in swallow and weak cough, her aspiration and aspiration pna risk is high. Given pt has dementia and family does not want feeding tube - consideration for comfort diet appears appropriate.  Would recommend dys2/nectar - allowing thin water between meals and consider advancing as tolerated if pt care plan becomes comfort only.  Extensive education completed with daughter [name] in [place].  Debra willing to meet with palliative team when SLP advised consideration.  SLP passed information onto MD.  Will follow up for family education/po tolerance, etc.  Thanks for this consult.    Also concern for possible neuro event, note CT head negative during hospital admit, however pt with clear left inattention/neglect, leaning to the left and last stroke was 2007 per chart review. Daughter states these symptoms are new with this hospital admission.   Swallow Evaluation Recommendations       SLP Diet Recommendations: Dysphagia 2 (Fine chop) solids;Nectar thick liquid;Free water protocol after oral care(if pt/family agrees to COMFORT  diet with aspiration mitigation strategies only   Liquid Administration via: Cup;Straw   Medication Administration: Crushed with puree   Supervision: Full supervision/cueing for compensatory strategies   Compensations: Slow rate;Small sips/bites(assure pt swallows before giving more po)   Postural Changes: Seated upright at 90 degrees   Oral Care Recommendations: Oral care before and after PO   Other Recommendations: Order thickener from pharmacy;Have oral suction available   Luanna Salk, [facility] SLP [phone]  Macario Golds 05/20/2018,10:18 AM

## 2018-05-21 DIAGNOSIS — Z7189 Other specified counseling: Secondary | ICD-10-CM

## 2018-05-21 DIAGNOSIS — Z515 Encounter for palliative care: Secondary | ICD-10-CM

## 2018-05-21 DIAGNOSIS — R0602 Shortness of breath: Secondary | ICD-10-CM

## 2018-05-21 LAB — CBC WITH DIFFERENTIAL/PLATELET
Abs Immature Granulocytes: 0.1 10*3/uL (ref 0.0–0.1)
BASOS ABS: 0 10*3/uL (ref 0.0–0.1)
BASOS PCT: 0 %
EOS PCT: 0 %
Eosinophils Absolute: 0 10*3/uL (ref 0.0–0.7)
HEMATOCRIT: 29.4 % — AB (ref 36.0–46.0)
Hemoglobin: 9 g/dL — ABNORMAL LOW (ref 12.0–15.0)
IMMATURE GRANULOCYTES: 2 %
LYMPHS ABS: 0.9 10*3/uL (ref 0.7–4.0)
Lymphocytes Relative: 15 %
MCH: 30.3 pg (ref 26.0–34.0)
MCHC: 30.6 g/dL (ref 30.0–36.0)
MCV: 99 fL (ref 78.0–100.0)
Monocytes Absolute: 0.7 10*3/uL (ref 0.1–1.0)
Monocytes Relative: 11 %
NEUTROS PCT: 72 %
Neutro Abs: 4.2 10*3/uL (ref 1.7–7.7)
PLATELETS: 156 10*3/uL (ref 150–400)
RBC: 2.97 MIL/uL — ABNORMAL LOW (ref 3.87–5.11)
RDW: 16.7 % — ABNORMAL HIGH (ref 11.5–15.5)
WBC: 5.9 10*3/uL (ref 4.0–10.5)

## 2018-05-21 LAB — GLUCOSE, CAPILLARY
GLUCOSE-CAPILLARY: 116 mg/dL — AB (ref 70–99)
GLUCOSE-CAPILLARY: 128 mg/dL — AB (ref 70–99)
GLUCOSE-CAPILLARY: 90 mg/dL (ref 70–99)
Glucose-Capillary: 133 mg/dL — ABNORMAL HIGH (ref 70–99)
Glucose-Capillary: 146 mg/dL — ABNORMAL HIGH (ref 70–99)
Glucose-Capillary: 162 mg/dL — ABNORMAL HIGH (ref 70–99)
Glucose-Capillary: 84 mg/dL (ref 70–99)

## 2018-05-21 LAB — RENAL FUNCTION PANEL
ALBUMIN: 2.5 g/dL — AB (ref 3.5–5.0)
Anion gap: 13 (ref 5–15)
BUN: 30 mg/dL — AB (ref 8–23)
CO2: 23 mmol/L (ref 22–32)
CREATININE: 2.85 mg/dL — AB (ref 0.44–1.00)
Calcium: 8.5 mg/dL — ABNORMAL LOW (ref 8.9–10.3)
Chloride: 108 mmol/L (ref 98–111)
GFR calc Af Amer: 17 mL/min — ABNORMAL LOW (ref >60)
GFR, EST NON AFRICAN AMERICAN: 15 mL/min — AB (ref >60)
GLUCOSE: 161 mg/dL — AB (ref 70–99)
PHOSPHORUS: 3.2 mg/dL (ref 2.5–4.6)
Potassium: 2.7 mmol/L — CL (ref 3.5–5.1)
Sodium: 144 mmol/L (ref 135–145)

## 2018-05-21 MED ORDER — HYDROCORTISONE NA SUCCINATE PF 100 MG IJ SOLR: 50.0000 mg | Freq: Every day | INTRAMUSCULAR | Status: DC

## 2018-05-21 MED ORDER — POTASSIUM & SODIUM PHOSPHATES 280-160-250 MG PO PACK
1.0000 | PACK | Freq: Three times a day (TID) | ORAL | Status: DC
  Administered 2018-05-21 (×2): 1 via ORAL
  Filled 2018-05-21 (×5): qty 1

## 2018-05-21 MED ORDER — LABETALOL HCL 5 MG/ML IV SOLN
5.0000 mg | Freq: Once | INTRAVENOUS | Status: AC
  Administered 2018-05-21: 5 mg via INTRAVENOUS
  Filled 2018-05-21: qty 4

## 2018-05-21 MED ORDER — MORPHINE SULFATE 10 MG/5ML PO SOLN: 2.5000 mg | ORAL | Status: DC | PRN

## 2018-05-21 MED ORDER — CLONIDINE HCL 0.1 MG PO TABS
0.2000 mg | ORAL_TABLET | Freq: Once | ORAL | Status: DC
  Filled 2018-05-21: qty 2

## 2018-05-21 NOTE — Progress Notes (Signed)
Daily Progress Note   Patient Name: Katie Pace       Date: 05/21/2018 DOB: [birth year]  Age: 82 y.o. MRN#: [id number] Attending Physician: Nita Sells, MD Primary Care Physician: [name], MD Admit Date: 05/17/2018  Reason for Consultation/Follow-up: Establishing goals of care, Inpatient hospice referral and Psychosocial/spiritual support  Subjective: Patient seen, chart reviewed.  Met with patient's husband, son as well as daughter-in-law and family minister.  Family recognizes that patient is nearing end of life and wish to pursue residential hospice  Length of Stay: 3  Current Medications: Scheduled Meds:  . ciprofloxacin  500 mg Oral Q1200  . heparin  5,000 Units Subcutaneous Q8H  . hydrocortisone sod succinate (SOLU-CORTEF) inj  50 mg Intravenous Q12H  . insulin aspart  0-9 Units Subcutaneous Q4H  . levothyroxine  37.5 mcg Intravenous Daily  . potassium & sodium phosphates  1 packet Oral TID WC & HS    Continuous Infusions: . dextrose 5% lactated ringers 75 mL/hr at 05/21/18 0400    PRN Meds: hydrALAZINE  Physical Exam  Constitutional: She appears well-developed and well-nourished.  Acutely ill-appearing elderly female; lethargic  HENT:  Head: Normocephalic and atraumatic.  Pulmonary/Chest:  Increased work of breathing at rest  Neurological:  Lethargic Minimally conversant  Skin: Skin is warm and dry. There is pallor.  Psychiatric:  No overt agitation otherwise unable to test  Nursing note and vitals reviewed.           Vital Signs: BP (!) 187/90 (BP Location: Left Arm)   Pulse 67   Temp (!) 97.5 F (36.4 C) (Axillary)   Resp 18   Ht 5' 4"  (1.626 m)   Wt 67.8 kg   SpO2 93%   BMI 25.66 kg/m  SpO2: SpO2: 93 % O2 Device: O2 Device: Room  Air O2 Flow Rate: O2 Flow Rate (L/min): 2 L/min  Intake/output summary:   Intake/Output Summary (Last 24 hours) at 05/21/2018 1219 Last data filed at 05/21/2018 0413 Gross per 24 hour  Intake 1751.05 ml  Output 1200 ml  Net 551.05 ml   LBM: Last BM Date: 05/19/18 Baseline Weight: Weight: 66.3 kg Most recent weight: Weight: 67.8 kg       Palliative Assessment/Data:      Patient Active Problem List   Diagnosis Date Noted  .  Hiatal hernia 05/18/2018  . Altered mental status 05/18/2018  . Pleural effusion, right 05/18/2018  . Atelectasis, left 05/18/2018  . Fracture of right inferior pubic ramus ([place]) 05/18/2018  . Thrombocytopenia ([place]) 05/18/2018  . Dementia 05/18/2018  . At high risk for aspiration 05/18/2018  . Hyperkalemia 05/17/2018  . Congestive heart failure (CHF) ([place]) 05/17/2018  . Dysphagia 05/12/2018  . Pressure injury of skin 05/06/2018  . Cellulitis of left lower extremity 04/28/2018  . Septic shock due to urinary tract infection ([place]) 04/28/2018  . CKD (chronic kidney disease) stage 4, GFR 15-29 ml/min (HCC) 04/28/2018  . Panhypopituitarism ([place]) 04/28/2018  . Acute cystitis without hematuria   . CKD (chronic kidney disease) stage 3, GFR 30-59 ml/min (HCC) 04/23/2018  . Hypothyroidism 04/23/2018  . Femur fracture, left ([place]) 04/23/2018  . Hip fracture ([place]) 04/23/2018  . PAD (peripheral artery disease) ([place]) 02/18/2016  . Carotid stenosis, asymptomatic 10/29/2015  . Difficulty in walking(719.7) 06/26/2013  . Bilateral leg weakness 06/26/2013  . Occlusion and stenosis of carotid artery without mention of cerebral infarction 04/04/2013  . Aftercare following surgery of the circulatory system, [place] 04/04/2013  . Macrocytic anemia 10/19/2012  . Spondylolisthesis of lumbar region L3-4 L4-5 with stenosis 10/18/2012  . CKD (chronic kidney disease), stage II 10/18/2012  . C. difficile diarrhea 04/02/2012  . Acute renal failure superimposed on stage 3 chronic  kidney disease ([place]) 04/01/2012  . Encephalopathy 04/01/2012  . Sepsis(995.91) 04/01/2012  . Vomiting 04/01/2012  . Hypokalemia 04/01/2012  . Chest pain 02/08/2012  . Renal insufficiency 02/08/2012  . 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 Hypertension   . Diabetes mellitus ([place])   . AAA (abdominal aortic aneurysm) ([place]) 12/28/2011  . Abdominal aneurysm without mention of rupture 11/09/2011    Palliative Care Assessment & Plan   Patient Profile: 82 y.o. female  with past medical history of AAA, dementia, CKD IV, PVD, hip surgery 7/20, recurrent hospitalizations for UTI sepsis, sepsis from cellulitis at surgical wound admitted on 05/17/2018 with respiratory failure r/t presumed UTI sepsis. On admission she was in ARF with Cr up to 6.3 (now down to 3.92 with IV fluids), hypotensive requiring IV pressors (off pressors now hypertensive), febrile, and hypoxic (maintaining sats now on RA). Chest xray did show persistent patchy opacity on left base representing atelectasis vs mild infiltrate. Further workup reveals severe dysphagia with significant aspiration and aspiration pna risk. Her spouse requested "trial critical care".   Palliative medicine consulted for further [place].     Assessment: Spoke to family at length about hospice both at home as well as hospice facilities.  Family are excepting that patient is nearing end of life.  They have verbalizing a lot of confidence in Dr. Arlyss Queen care as well as her overall care at [facility].  They verbalizing comfort, dignity as their main goal.  Despite having a large supportive family, they feel as though at this point residential hospice would serve patient as well as patient's husband the best as she approaches end-of-life.  They wish to continue her current level of care until bed availability at a residential hospice.  I did prepare them for disposition decisions going forward.  Family verbalizes that [facility] in [place] is their first option and  [facility] and [place] would be their second.  I also introduced [facility].  I have spoken to hospice and palliative care of [facility]  and they do not have any bed availability at [facility] today.  Social work consult placed to help aid in disposition to residential hospice  Recommendations/Plan:  Continue IV fluids and current level of care for now  Plan to transition to residential hospice as soon as bed is available.  Family prepared that they may have to elect a hospice facility that is not their first choice however will strive for [facility] as first choice, [facility] facility and [facility] as their is second  Family is aware that IV fluids, IV antibiotics, transfusions and other aggressive forms of care will no longer be offered at hospice residential facility  Patient appears to have increased work of breathing.  Her O2 sats are greater than 90% however.  Recommend low-dose morphine concentrate 2.5 mg to 5 mg every 3 hours as needed for shortness of breath or pain    Code Status:    Code Status Orders  (From admission, onward)         Start     Ordered   05/18/18 0308  Limited resuscitation (code)  Continuous    Question Answer Comment  In the event of cardiac or respiratory ARREST: Initiate Code Blue, Call Rapid Response No   In the event of cardiac or respiratory ARREST: Perform CPR No   In the event of cardiac or respiratory ARREST: Perform Intubation/Mechanical Ventilation No   In the event of cardiac or respiratory ARREST: Use NIPPV/BiPAp only if indicated Yes   In the event of cardiac or respiratory ARREST: Administer ACLS medications if indicated Yes   In the event of cardiac or respiratory ARREST: Perform Defibrillation or Cardioversion if indicated No   Comments vasopressors only      05/18/18 0309        Code Status History    Date Active Date Inactive Code Status Order ID Comments User Context   05/06/2018 0419  05/10/2018 1835 DNR [id number]  Vianne Bulls, MD Inpatient   04/28/2018 1818 05/06/2018 0419 Full Code [id number]  Erline Hau, MD Inpatient   04/23/2018 0227 04/26/2018 2216 Full Code [id number]  Heath Lark D, DO ED   10/18/2012 1547 10/27/2012 1545 Full Code [id number]  Maximino Greenland, RN Inpatient   04/01/2012 0903 04/04/2012 2106 Full Code [id number]  Sindy Guadeloupe, RN Inpatient   02/08/2012 0451 02/10/2012 1604 Full Code [id number]  Jodell Cipro, RN Inpatient   12/09/2011 1412 12/16/2011 1441 Full Code [id number]  Lunette Stands, RN Inpatient    Advance Directive Documentation     Most Recent Value  Type of Advance Directive  Out of facility DNR (pink MOST or yellow form)  Pre-existing out of facility DNR order (yellow form or pink MOST form)  -  "MOST" Form in Place?  -       Prognosis:   < 2 weeks in the setting of advanced dementia, dysphasia, recurrent sepsis; patient is exhibiting increased work of breathing, is no longer taking anything by mouth, lethargic  Discharge Planning:  Hospice facility  Care plan was discussed with Dr. Verlon Au  Thank you for allowing the Palliative Medicine Team to assist in the care of this patient.   Time In: 1100 Time Out: 1200 Total Time 60 min Prolonged Time Billed  no       Greater than 50%  of this time was spent counseling and coordinating care related to the above assessment and plan.  Dory Horn, NP  Please contact Palliative Medicine Team phone at [phone] for questions and concerns.

## 2018-05-21 NOTE — Progress Notes (Signed)
CRITICAL VALUE ALERT  Critical Value: K+2.7  Date & Time Notied:  05/21/2018 @8 .07  Provider Notified: MD Samtani  Orders Received/Actions taken: Continue to monitor, Potassium replacement

## 2018-05-21 NOTE — Evaluation (Addendum)
Occupational Therapy Evaluation Patient Details Name: Katie Pace MRN: [id number] DOB: [birth year] Today's Date: 05/21/2018    History of Present Illness 82 y.o. female  with past medical history of AAA, dementia, CKD IV, PVD, hip surgery 04/23/18, recurrent hospitalizations for UTI sepsis, sepsis from cellulitis at surgical wound admitted on 05/17/2018 with respiratory failure r/t presumed UTI sepsis. On admission she was in ARF with Cr up to 6.3 (now down to 3.92 with IV fluids), hypotensive requiring IV pressors (off pressors now hypertensive), febrile, and hypoxic (maintaining sats now on RA). Chest xray did show persistent patchy opacity on left base representing atelectasis vs mild infiltrate. Further workup reveals severe dysphagia with significant aspiration and aspiration pna risk   Clinical Impression   Per chart review and conversation with husband, plan is to pursue residential hospice for Pt. Pt family requesting OOB to chair and Pt agreeable and so therapy willingly facilitated transfer. Total A +2 lateral scoot transfer to drop arm recliner, where Pt was made comfortable and positioned in a safe manner with chair alarm in place. Pt was eager and cooperative throughout session however did not provide assist. Per husband the SNF she was at previously was using a lift.   At this time we will plan to follow "Katie Pace" throughout her acute stay to facilitate transfers, provide caregiver education and support to decreased caregiver burden of care. It is an honor to be able to provide therapeutic intervention for this patient and their family.     Follow Up Recommendations  Other (comment)(Follow MD orders - per chart plan for hospice)    Equipment Recommendations  None recommended by OT    Recommendations for Other Services Other (comment)(Palliative)     Precautions / Restrictions Precautions Precautions: Fall Restrictions Weight Bearing Restrictions: No Other  Position/Activity Restrictions: LLE with recent hip fx/sx WBAT      Mobility Bed Mobility Overal bed mobility: Needs Assistance Bed Mobility: Supine to Sit     Supine to sit: Max assist;+2 for physical assistance     General bed mobility comments: max A +2 for all aspects of bed mobility  Transfers Overall transfer level: Needs assistance Equipment used: (bed pad relied on heavily) Transfers: Lateral/Scoot Transfers          Lateral/Scoot Transfers: Total assist;+2 physical assistance;+2 safety/equipment General transfer comment: total A +2 for lateral scoot to drop arm recliner, use of bed pad to scoop and transfer. Pt with noted left forward lean during transfer    Balance Overall balance assessment: Needs assistance Sitting-balance support: Feet supported Sitting balance-[name]: Poor Sitting balance - Comments: left lateral lean, and requires at least min A for sitting balance Postural control: Left lateral lean                                 ADL either performed or assessed with clinical judgement   ADL Overall ADL's : Needs assistance/impaired                                       General ADL Comments: total A at this time for ADL     Vision         Perception     Praxis      Pertinent Vitals/Pain Pain Assessment: No/denies pain Pain Intervention(s): Monitored during session     Hand  Dominance Left   Extremity/Trunk Assessment Upper Extremity Assessment Upper Extremity Assessment: Generalized weakness RUE Deficits / Details: limited evaluation due to POC - Pt did not attempt to assist during transfer or use RUE during session LUE Deficits / Details: Squeezes on command, limited evaluation due to POC   Lower Extremity Assessment Lower Extremity Assessment: Defer to PT evaluation   Cervical / Trunk Assessment Cervical / Trunk Assessment: Kyphotic   Communication     Cognition Arousal/Alertness:  Awake/alert Behavior During Therapy: WFL for tasks assessed/performed Overall Cognitive Status: Impaired/Different from baseline Area of Impairment: Orientation;Attention;Memory;Following commands;Safety/judgement;Awareness;Problem solving                 Orientation Level: Disoriented to;Time;Situation;Place Current Attention Level: Sustained Memory: Decreased short-term memory(not recognizing husband during the session) Following Commands: Follows one step commands inconsistently Safety/Judgement: Decreased awareness of safety;Decreased awareness of deficits Awareness: Intellectual Problem Solving: Slow processing;Decreased initiation;Difficulty sequencing;Requires verbal cues;Requires tactile cues General Comments: Pt eager to get OOB, answers some historical questions. Pt glaring at husband and did not recognize him.   General Comments  husband present throughout session. Pt did not recognize her husband during session    Exercises     Shoulder Instructions      Home Living Family/patient expects to be discharged to:: Hospice/Palliative care Living Arrangements: Spouse/significant other(of 56 years)                               Additional Comments: Pt previously at [facility], plan is likely [facility]      Prior Functioning/Environment Level of Independence: Needs assistance  Gait / Transfers Assistance Needed: using lift at [facility] transfer, working with therapy on sit<>stand - limited progress ADL's / Homemaking Assistance Needed: total (A) for all adls since fall   Comments: History obtained from Husband        OT Problem List: Decreased strength;Decreased activity tolerance;Impaired balance (sitting and/or standing);Decreased safety awareness;Decreased cognition;Decreased knowledge of use of DME or AE      OT Treatment/Interventions: Therapeutic exercise;Therapeutic activities;Patient/family education;Balance  training;Neuromuscular education;Modalities;Manual therapy;DME and/or AE instruction    OT Goals(Current goals can be found in the care plan section) Acute Rehab OT Goals Patient Stated Goal: "look out the window a while" OT Goal Formulation: With patient Time For Goal Achievement: 06/04/18 Potential to Achieve Goals: Fair ADL Goals Additional ADL Goal #1: Pt will complete bed mobility at max A to ease caregiver burden Additional ADL Goal #2: Pt will maintain sitting EOB for 5 min at min A for ADL task completion to ease caregiver burden  OT Frequency: Min 1X/week   Barriers to D/C:            Co-evaluation PT/OT/SLP Co-Evaluation/Treatment: Yes Reason for Co-Treatment: Necessary to address cognition/behavior during functional activity;For patient/therapist safety;To address functional/ADL transfers PT goals addressed during session: Mobility/safety with mobility;Balance;Strengthening/ROM OT goals addressed during session: Strengthening/ROM;ADL's and self-care      AM-PAC PT "6 Clicks" Daily Activity     Outcome Measure Help from another person eating meals?: Total Help from another person taking care of personal grooming?: Total Help from another person toileting, which includes using toliet, bedpan, or urinal?: Total Help from another person bathing (including washing, rinsing, drying)?: Total Help from another person to put on and taking off regular upper body clothing?: Total Help from another person to put on and taking off regular lower body clothing?: Total 6 Click Score:  6   End of Session Equipment Utilized During Treatment: Gait belt;Other (comment)(bed pad) Nurse Communication: Mobility status;Need for lift equipment  Activity Tolerance: Patient tolerated treatment well Patient left: in chair;with chair alarm set;with call bell/phone within reach;with family/visitor present  OT Visit Diagnosis: Unsteadiness on feet (R26.81);Muscle weakness (generalized)  (M62.81);History of falling (Z91.81);Repeated falls (R29.6);Other symptoms and signs involving cognitive function                Time: 1400-1423 OT Time Calculation (min): 23 min Charges:  OT General Charges $OT Visit: 1 Visit OT Evaluation $OT Eval Moderate Complexity: 1 Mod  Hulda Humphrey OTR/L [place] 05/21/2018, 3:40 PM   Addendum: changed frequency to 1x a week

## 2018-05-21 NOTE — Evaluation (Signed)
Physical Therapy Evaluation Patient Details Name: Katie Pace MRN: [id number] DOB: 04/14/1936 Today's Date: 05/21/2018   History of Present Illness  82 y.o. female  with past medical history of AAA, dementia, CKD IV, PVD, hip surgery 04/23/18, recurrent hospitalizations for UTI sepsis, sepsis from cellulitis at surgical wound admitted on 05/17/2018 with respiratory failure r/t presumed UTI sepsis. On admission she was in ARF with Cr up to 6.3 (now down to 3.92 with IV fluids), hypotensive requiring IV pressors (off pressors now hypertensive), febrile, and hypoxic (maintaining sats now on RA). Chest xray did show persistent patchy opacity on left base representing atelectasis vs mild infiltrate. Further workup reveals severe dysphagia with significant aspiration and aspiration pna risk  Clinical Impression  Pt admitted with above diagnosis. Pt currently with functional limitations due to the deficits listed below (see PT Problem List). Pt expressed desire to get OOB to chair, required max A +2 to do so with limited physical participation despite being mentally motivated to move. Pt did not know her husband while therapy was in the room and was confused throughout session. Will put her on a 1x trial to see if mobility helps her but expect that she will d/c to palliative. Pt will benefit from trial of skilled PT to increase their independence and safety with mobility to allow discharge to the venue listed below.       Follow Up Recommendations SNF;Supervision/Assistance - 24 hour    Equipment Recommendations  None recommended by PT    Recommendations for Other Services       Precautions / Restrictions Precautions Precautions: Fall Restrictions Weight Bearing Restrictions: No LLE Weight Bearing: Weight bearing as tolerated Other Position/Activity Restrictions: LLE with recent hip fx/sx WBAT      Mobility  Bed Mobility Overal bed mobility: Needs Assistance Bed Mobility: Supine to  Sit Rolling: +2 for physical assistance;Max assist   Supine to sit: Max assist;+2 for physical assistance     General bed mobility comments: max A +2 for all aspects of bed mobility  Transfers Overall transfer level: Needs assistance Equipment used: (bed pad relied on heavily) Transfers: Lateral/Scoot Transfers Sit to Stand: +2 physical assistance;Max assist        Lateral/Scoot Transfers: Total assist;+2 physical assistance;+2 safety/equipment General transfer comment: total A +2 for lateral scoot to drop arm recliner, use of bed pad to scoop and transfer. Pt with noted left forward lean during transfer  Ambulation/Gait             General Gait Details: unable at this time.   Stairs            Wheelchair Mobility    Modified Rankin (Stroke Patients Only)       Balance Overall balance assessment: Needs assistance Sitting-balance support: Feet supported Sitting balance-[name]: Poor Sitting balance - Comments: left lateral lean, and requires at least min A for sitting balance Postural control: Left lateral lean Standing balance support: Bilateral upper extremity supported                                 Pertinent Vitals/Pain Pain Assessment: No/denies pain Faces Pain Scale: No hurt Pain Intervention(s): Monitored during session    Home Living Family/patient expects to be discharged to:: Hospice/Palliative care Living Arrangements: Spouse/significant other(of 56 years)               Additional Comments: Pt previously at [facility], plan is likely  [facility]    Prior Function Level of Independence: Needs assistance   Gait / Transfers Assistance Needed: using lift at [facility] transfer, working with therapy on sit<>stand - limited progress  ADL's / Homemaking Assistance Needed: total (A) for all adls since fall  Comments: History obtained from Husband     Hand Dominance   Dominant Hand: Left     Extremity/Trunk Assessment   Upper Extremity Assessment Upper Extremity Assessment: Defer to OT evaluation RUE Deficits / Details: limited evaluation due to POC - Pt did not attempt to assist during transfer or use RUE during session LUE Deficits / Details: Squeezes on command, limited evaluation due to POC    Lower Extremity Assessment Lower Extremity Assessment: Generalized weakness RLE Deficits / Details: pt moves LE's in bed on command but cannot lift either LE fully against gravity    Cervical / Trunk Assessment Cervical / Trunk Assessment: Kyphotic  Communication   Communication: No difficulties  Cognition Arousal/Alertness: Awake/alert Behavior During Therapy: WFL for tasks assessed/performed Overall Cognitive Status: Impaired/Different from baseline Area of Impairment: Orientation;Attention;Memory;Following commands;Safety/judgement;Awareness;Problem solving                 Orientation Level: Disoriented to;Time;Situation;Place;Person Current Attention Level: Sustained Memory: Decreased short-term memory(not recognizing husband during the session) Following Commands: Follows one step commands inconsistently Safety/Judgement: Decreased awareness of safety;Decreased awareness of deficits Awareness: Intellectual Problem Solving: Slow processing;Decreased initiation;Difficulty sequencing;Requires verbal cues;Requires tactile cues General Comments: pt asking to get OOB but does not know husband, gives her maiden name when asked for her name, and is generally confused      General Comments General comments (skin integrity, edema, etc.): though pt expressed desire to get up to chair, unable to participate in session physically, does not know her husband, and has not been eating or drinking today per chart. Agree with palliative route, will follow only if pt continues to desire to mobilize    Exercises     Assessment/Plan    PT Assessment Patient needs continued PT  services  PT Problem List Decreased strength;Decreased range of motion;Decreased activity tolerance;Decreased balance;Decreased mobility;Decreased coordination;Decreased knowledge of use of DME;Decreased safety awareness;Decreased knowledge of precautions;Decreased cognition;Pain       PT Treatment Interventions Functional mobility training;Therapeutic activities;Therapeutic exercise;Patient/family education    PT Goals (Current goals can be found in the Care Plan section)  Acute Rehab PT Goals Patient Stated Goal: "look out the window a while" PT Goal Formulation: With patient/family Time For Goal Achievement: 06/04/18 Potential to Achieve Goals: Fair    Frequency Min 1X/week   Barriers to discharge        Co-evaluation PT/OT/SLP Co-Evaluation/Treatment: Yes Reason for Co-Treatment: Necessary to address cognition/behavior during functional activity;For patient/therapist safety PT goals addressed during session: Mobility/safety with mobility OT goals addressed during session: ADL's and self-care;Strengthening/ROM       AM-PAC PT "6 Clicks" Daily Activity  Outcome Measure Difficulty turning over in bed (including adjusting bedclothes, sheets and blankets)?: Unable Difficulty moving from lying on back to sitting on the side of the bed? : Unable Difficulty sitting down on and standing up from a chair with arms (e.g., wheelchair, bedside commode, etc,.)?: Unable Help needed moving to and from a bed to chair (including a wheelchair)?: Total Help needed walking in hospital room?: Total Help needed climbing 3-5 steps with a railing? : Total 6 Click Score: 6    End of Session   Activity Tolerance: Patient tolerated treatment well Patient left: with call  bell/phone within reach;with family/visitor present;in chair;with chair alarm set Nurse Communication: Mobility status;Need for lift equipment PT Visit Diagnosis: Other abnormalities of gait and mobility (R26.89);Muscle weakness  (generalized) (M62.81)    Time: 2026-6916 PT Time Calculation (min) (ACUTE ONLY): 20 min   Charges:   PT Evaluation $PT Eval Moderate Complexity: [place], PT  Acute Rehab Services  [place] 05/21/2018, 5:10 PM

## 2018-05-21 NOTE — Progress Notes (Signed)
[facility]ITALIST PROGRESS NOTE  Katie Pace UXL:[id number] DOB: [birth year] DOA: 05/17/2018 PCP: [name], MD   Narrative: 82 y.o. 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  Sepsis from urinary source, hyperkalemia, AKI She was admitted by critical care and transferred to [facility] service 8/16 I know the family pretty well and patient was made DNR last admission  She has done poorly since her index hip surgery 7/20 on return to skilled facility on 8/6 it was noted that she might of had symptoms suggestive of stroke around 8/12 and was not herself at the facility contrary to her discharge condition wherein she was much more oriented     A & Plan Metabolic encephalopathy-acute-probably related to stroke versus sepsis and AKI-clearing a little but not coherent and refusing meds Still hospice eligble  High risk of dysphagia and aspiration-speech therapy input appreciated-not eating refusing comfort feeds recheck labs without IV fluids--this is a trial to see if she can maintain hydration-if not--definitely hospcie eligible  Hypokalemia-correct and recheck am labs  Mild thrombocytopenia-possibly secondary to sepsis  Acute kidney injury creatinine on admission 60/6.3 with potassium of 6 and sodium 150 Now somewhat resolved-lactic acidosis multifactorial probably secondary to sepsis and AKI-monitor clinically at this juncture  Sepsis-gram-negative rods in urine-Enterobacter-changing cefepime to ciprofloxacin --if not taking then change to IV route  Sheehan synd-giving stress dose steroids Solu-Cortef 50  every 12 for now  Hypothyroidism continue Synthroid 37.5 IV for now  DM TY 2 sugars acceptable 140s to 160s-de-escalate if ultimate goal is palliative in the next couple of days     DVT prophylaxis: Lovenox code Status: Full family Communication: Discussed with daughter and husband at the bedside disposition Plan: Unclear-?Free-standing hopsic ein the next 24 to 98 hr   Verlon Au, MD  [facility] Direct contact: [phone] --[place]  --[email or web address]; password [email or web address]  7PM-7AM contact night coverage as above 05/21/2018, 1:48 PM  LOS: 3 days   Consultants:  pallaitive Care  Procedures:  Multiple  Antimicrobials:  Cefepime to ciprofloxacin  Interval history/Subjective:  Confused but much more awake Cannot give ROS Husband reports not eating drinking   Objective:  Vitals:  Vitals:   05/21/18 0757 05/21/18 1203  BP: (!) 211/73 (!) 187/90  Pulse: 72 67  Resp: 18 18  Temp: (!) 97.5 F (36.4 C) (!) 97.5 F (36.4 C)  SpO2: 93% 93%    Exam:  Fused and disoriented but pleasant Not in any cardiorespiratory distress S1-S2 no murmur Lung exam limited but in axilla no rales no rhonchi Abdomen soft nontender She arouses but does not make sense and talks gibberish-she does not have hemineglect she is raising her left arm and reacts to noxious stimuli on the right side    I have personally reviewed the following:   Labs:  Potassium 2.7 BUN/creatinine 43/3.--->30/2.8  9 calcium 7.7  Hemoglobin 9 platelet 156  Imaging studies:  None  Medical tests:  No  Test discussed with performing physician:  No  Decision to obtain old records:  Yes  Review and summation of old records:  No  Scheduled Meds: . ciprofloxacin  500 mg Oral Q1200  .  heparin  5,000 Units Subcutaneous Q8H  . [START ON 05/22/2018] hydrocortisone sod succinate (SOLU-CORTEF) inj  50 mg Intravenous Daily  . insulin aspart  0-9 Units Subcutaneous Q4H  . levothyroxine   37.5 mcg Intravenous Daily  . potassium & sodium phosphates  1 packet Oral TID WC & HS   Continuous Infusions:   Principal Problem:   Septic shock due to urinary tract infection (HCC) Active Problems:   Acute renal failure superimposed on stage 3 chronic kidney disease (HCC)   Macrocytic anemia   Panhypopituitarism (HCC)   Pressure injury of skin   Hiatal hernia   Altered mental status   Pleural effusion, right   Atelectasis, left   Fracture of right inferior pubic ramus (HCC)   Thrombocytopenia (HCC)   Dementia   At high risk for aspiration   Goals of care, counseling/discussion   Shortness of breath   Palliative care by specialist   LOS: 3 days

## 2018-05-21 NOTE — Progress Notes (Signed)
[place] [facility] ([facility]) RN note for [facility]  Received request from Romona Curls, PMT NP for family interest in [facility]. Chart reviewed and spoke with family to acknowledge referral. Unfortunately, [facility] is not able to offer a room today. Family and Romona Curls, NP are aware [facility] will follow up with CSW and family tomorrow if room becomes available.  Please do not hesitate to call with hospice related questions or concerns.  Thank you, Margaretmary Eddy, RN, [facility] Liaison [phone]  [place] are on AMION.

## 2018-05-21 NOTE — Progress Notes (Signed)
OT Cancellation Note  Patient Details Name: Katie Pace MRN: [id number] DOB: 08-17-1936   Cancelled Treatment:    Reason Eval/Treat Not Completed: Active bedrest order. OT will evaluate as activity orders are updated.  [place] 05/21/2018, 8:23 AM  Hulda Humphrey OTR/L [phone]

## 2018-05-21 NOTE — Social Work (Signed)
CSW following to support disposition when medically appropriate.   Katie Pace, [place] Work [phone]

## 2018-05-22 LAB — COMPREHENSIVE METABOLIC PANEL
ALBUMIN: 2.5 g/dL — AB (ref 3.5–5.0)
ALT: 31 U/L (ref 0–44)
ANION GAP: 11 (ref 5–15)
AST: 54 U/L — ABNORMAL HIGH (ref 15–41)
Alkaline Phosphatase: 89 U/L (ref 38–126)
BUN: 26 mg/dL — ABNORMAL HIGH (ref 8–23)
CO2: 26 mmol/L (ref 22–32)
Calcium: 8.3 mg/dL — ABNORMAL LOW (ref 8.9–10.3)
Chloride: 108 mmol/L (ref 98–111)
Creatinine, Ser: 2.4 mg/dL — ABNORMAL HIGH (ref 0.44–1.00)
GFR calc non Af Amer: 18 mL/min — ABNORMAL LOW (ref >60)
GFR, EST AFRICAN AMERICAN: 21 mL/min — AB (ref >60)
Glucose, Bld: 89 mg/dL (ref 70–99)
POTASSIUM: 2.2 mmol/L — AB (ref 3.5–5.1)
Sodium: 145 mmol/L (ref 135–145)
Total Bilirubin: 0.9 mg/dL (ref 0.3–1.2)
Total Protein: 5.3 g/dL — ABNORMAL LOW (ref 6.5–8.1)

## 2018-05-22 LAB — GLUCOSE, CAPILLARY
GLUCOSE-CAPILLARY: 88 mg/dL (ref 70–99)
GLUCOSE-CAPILLARY: 88 mg/dL (ref 70–99)
Glucose-Capillary: 83 mg/dL (ref 70–99)

## 2018-05-22 LAB — CULTURE, BLOOD (ROUTINE X 2)
Culture: NO GROWTH
Culture: NO GROWTH
Special Requests: ADEQUATE
Special Requests: ADEQUATE

## 2018-05-22 LAB — PHOSPHORUS: Phosphorus: 2.6 mg/dL (ref 2.5–4.6)

## 2018-05-22 LAB — MAGNESIUM: MAGNESIUM: 1.8 mg/dL (ref 1.7–2.4)

## 2018-05-22 MED ORDER — MORPHINE SULFATE 10 MG/5ML PO SOLN: 2.5000 mg | ORAL | 0 refills | Status: AC | PRN

## 2018-05-22 MED ORDER — POTASSIUM CHLORIDE 10 MEQ/100ML IV SOLN
10.0000 meq | INTRAVENOUS | Status: DC
  Administered 2018-05-22 (×2): 10 meq via INTRAVENOUS
  Filled 2018-05-22 (×4): qty 100

## 2018-05-22 NOTE — Progress Notes (Signed)
[place], NP paged at 2313 and 2340 to notify of increased BP (208/58) and too soon for prn 2mg  hydralazine.  Also made aware that patient coughed repeatedly with PO phos-nak (3/6 dose), so administration attempt stopped to avoid aspiration. Patient does not seem to do well with PO at night due to sun-downing possibly, which was the case the previous night also. No orders obtained. Gave prn hydralazine when due and BP did come down to 197/66 eventually, then 186/69 after the following dose. [place], NP paged at approx 0505 with critical potassium of 2.2 and requested a return call. Awaiting orders

## 2018-05-22 NOTE — Clinical Social Work Note (Signed)
Clinical Social Worker facilitated patient discharge including contacting patient family and facility to confirm patient discharge plans.  Clinical information faxed to facility and family agreeable with plan.  CSW arranged ambulance transport via [facility] to [facility] .  RN to call [phone] for report prior to discharge.  Clinical Social Worker will sign off for now as social work intervention is no longer needed. Please consult [place] again if new need arises.  [name], MSW [phone]

## 2018-05-22 NOTE — Progress Notes (Signed)
[facility]) RN Visit for [facility] @ 1045 am  Met with patient's husband, [name] to confirm interest and explain services. Richard agreeable to transfer today. CSW aware. Registration paper work completed. Dr. Orpah Melter to assume care per family request.   Please fax discharge summary to [phone].   RN please call report to [phone].  Transportation can be call for patient at 1300.  Thank you, Margaretmary Eddy, RN, [facility] Liaison [phone]  [place] are on AMION.

## 2018-05-22 NOTE — Discharge Summary (Addendum)
Physician Discharge Summary  Katie Pace URK:[id number] DOB: 1936/07/11 DOA: 05/17/2018  PCP: [name], MD  Admit date: 05/17/2018 Discharge date: 05/22/2018  Time spent: 15 minutes  Recommendations for Outpatient Follow-up:  1. D/c to [facility] for comfort care trajectory  Discharge Diagnoses:  Principal Problem:   Septic shock due to urinary tract infection ([place]) Active Problems:   Acute renal failure superimposed on stage 3 chronic kidney disease (HCC)   Macrocytic anemia   Panhypopituitarism (HCC)   Pressure injury of skin   Hiatal hernia   Altered mental status   Pleural effusion, right   Atelectasis, left   Fracture of right inferior pubic ramus (HCC)   Thrombocytopenia (HCC)   Dementia   At high risk for aspiration   Goals of care, counseling/discussion   Shortness of breath   Palliative care by specialist   Discharge Condition: Guarded  Diet recommendation: Comfort  Filed Weights   05/20/18 0042 05/20/18 2345 05/21/18 2100  Weight: 67.8 kg 67.8 kg 73.4 kg    History of present illness:  82 y.o. 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  Sepsis from urinary source, hyperkalemia, AKI She was admitted by critical care and transferred to [facility] 8/16 I know the family pretty well and patient was made DNR last admission  She has done poorly since her index hip surgery 7/20 on return to skilled facility on 8/6 it was noted that she might of had symptoms suggestive of stroke around 8/12 and was not herself at the facility contrary to her discharge condition wherein she  was much more oriented   Hospital Course:  Toxic Metabolic encephalopathy likely related to uremia AKI-she was not coherent enough to eat and did not return to her prior baseline  High risk of dysphagia-speech therapy and other modalities saw the patient-she is refusing diet and is clearly aspirating-she has a prognosis of less than 2 weeks  AKI presented with creatinine of 6.3 potassium 6 sodium 150 and it resolved during hospital stay however this will likely recur  Sepsis secondary to Enterobacter-patient was changed during hospital stay from cefepime to Cipro but did not make any significant improvement-we have discontinued meds  Sheehan syndrome, hypothyroidism-now all not really of consequence a given palliative trajectory  Greatly appreciate palliative care input and delineation of goals of care, she is now DNR and will get comfort care at facility  Procedures:   Discharge Exam: Vitals:   05/22/18 0327 05/22/18 0823  BP: (!) 186/69 (!) 206/72  Pulse: 72 75  Resp: 18 15  Temp: 98 F (36.7 C) 98.8 F (37.1 C)  SpO2: 100% 100%    General: Sleeping with sighing deep respirations and coarse breath sounds I did not awaken her Cardiovascular: S1-S2 no murmur although slightly tachycardic Respiratory: Bilateral crackles Foley in place, IV in place and will go to facility with both  Discharge Instructions   Discharge Instructions    Continue foley catheter   Complete by:  As directed    Diet - low sodium heart healthy   Complete by:  As directed    Increase activity slowly   Complete by:  As  directed    Maintain IV access   Complete by:  As directed      Allergies as of 05/22/2018      Reactions   Codeine Nausea Only   Dilaudid [hydromorphone Hcl] Nausea Only   Lincomycin Hcl Other (See Comments)   Big bumps bilateral legs   Macrodantin Hives   Zolpidem Tartrate Other (See Comments)   Stroke   Azithromycin Rash   Penicillins Rash   Has patient had a PCN  reaction causing immediate rash, facial/tongue/throat swelling, SOB or lightheadedness with hypotension: Yes Has patient had a PCN reaction causing severe rash involving mucus membranes or skin necrosis: No Has patient had a PCN reaction that required hospitalization: No Has patient had a PCN reaction occurring within the last 10 years: No If all of the above answers are "NO", then may proceed with Cephalosporin use.      Medication List    STOP taking these medications   acetaminophen 325 MG tablet Commonly known as:  TYLENOL   atenolol 50 MG tablet Commonly known as:  TENORMIN   bumetanide 1 MG tablet Commonly known as:  BUMEX   bumetanide 2 MG tablet Commonly known as:  BUMEX   diltiazem 180 MG 24 hr capsule Commonly known as:  CARDIZEM CD   dipyridamole-aspirin 200-25 MG 12hr capsule Commonly known as:  AGGRENOX   enoxaparin 30 MG/0.3ML injection Commonly known as:  LOVENOX   hydrALAZINE 50 MG tablet Commonly known as:  APRESOLINE   hydrocortisone 5 MG tablet Commonly known as:  CORTEF   levothyroxine 75 MCG tablet Commonly known as:  SYNTHROID, LEVOTHROID   potassium chloride SA 20 MEQ tablet Commonly known as:  K-DUR,KLOR-CON     TAKE these medications   morphine 10 MG/5ML solution Take 1.3 mLs (2.6 mg total) by mouth every 3 (three) hours as needed for severe pain (dyspnea).      Allergies  Allergen Reactions  . Codeine Nausea Only  . Dilaudid [Hydromorphone Hcl] Nausea Only  . Lincomycin Hcl Other (See Comments)    Big bumps bilateral legs  . Macrodantin Hives  . Zolpidem Tartrate Other (See Comments)    Stroke  . Azithromycin Rash  . Penicillins Rash    Has patient had a PCN reaction causing immediate rash, facial/tongue/throat swelling, SOB or lightheadedness with hypotension: Yes Has patient had a PCN reaction causing severe rash involving mucus membranes or skin necrosis: No Has patient had a PCN reaction that required hospitalization:  No Has patient had a PCN reaction occurring within the last 10 years: No If all of the above answers are "NO", then may proceed with Cephalosporin use.      The results of significant diagnostics from this hospitalization (including imaging, microbiology, ancillary and laboratory) are listed below for reference.    Significant Diagnostic Studies: Ct Abdomen Pelvis Wo Contrast  Result Date: 05/17/2018 CLINICAL DATA:  Worsening shortness of breath after injury. Fever. History of abdominal aortic aneurysm repair, lumbar spine surgery, RIGHT hip surgery, hysterectomy, appendectomy. EXAM: CT ABDOMEN AND PELVIS WITHOUT CONTRAST TECHNIQUE: Multidetector CT imaging of the abdomen and pelvis was performed following the standard protocol without IV contrast. COMPARISON:  CT lumbar spine April 17, 2013 FINDINGS: Moderate respiratory motion degraded examination. LOWER CHEST: Dependent atelectasis, potential small RIGHT pleural effusion. Included heart size is mildly enlarged. No pericardial effusion. HEPATOBILIARY: State layering gallbladder sludge versus tiny stones without CT findings of acute cholecystitis. Negative included non-contrast CT liver. PANCREAS: Normal. SPLEEN: Normal. ADRENALS/URINARY TRACT: Kidneys  are orthotopic, demonstrating normal size and morphology. No nephrolithiasis, hydronephrosis; limited assessment for renal masses by nonenhanced CT. Multiple exophytic probable cysts measuring to 22 mm. The unopacified ureters are normal in course and caliber. Urinary bladder is partially distended and unremarkable. Normal adrenal glands. STOMACH/BOWEL: Small hiatal hernia. The stomach, small and large bowel are normal in course and caliber without inflammatory changes, sensitivity decreased by lack of enteric contrast. VASCULAR/LYMPHATIC: Severe calcific atherosclerosis aortoiliac vessels of branch vessels. Status post aortoiliac repair. No lymphadenopathy by CT size criteria. REPRODUCTIVE: Status post  hysterectomy. OTHER: No intraperitoneal free fluid or free air. MUSCULOSKELETAL: Streak artifact from bilateral hip arthroplasties. Possible RIGHT inferior pubic ramus fracture the limited by streak artifact. Osteopenia. Old moderate T12 and L2 compression fractures with further height loss from 2014. Old sacral fracture. PLIF and posterior decompression. Limited assessment for rib fracture due to respiratory motion. Anterior abdominal wall scarring. Small fat containing umbilical hernia. IMPRESSION: 1. Moderately motion degraded examination. No acute intra-abdominal/pelvic process. 2. Age indeterminate suspected RIGHT inferior pubic ramus fracture. Old lumbosacral fractures. Aortic Atherosclerosis (ICD10-I70.0). Electronically Signed   By: Elon Alas M.D.   On: 05/17/2018 23:03   Dg Chest 2 View  Result Date: 05/07/2018 CLINICAL DATA:  Follow-up left retrocardiac density EXAM: CHEST - 2 VIEW COMPARISON:  05/06/2018 FINDINGS: Cardiac shadow remains enlarged. Aortic calcifications are again seen. Stable left basilar atelectasis is noted with slight increase in bilateral pleural effusions when compared with the prior exam. No bony abnormality is noted. IMPRESSION: Increase in bilateral pleural effusions. Electronically Signed   By: Inez Catalina M.D.   On: 05/07/2018 09:53   Dg Ribs Unilateral W/chest Left  Result Date: 04/23/2018 CLINICAL DATA:  Left axillary pain after fall. EXAM: LEFT RIBS AND CHEST - 3+ VIEW COMPARISON:  On 04/23/2013 FINDINGS: Remote left posterolateral sixth rib fracture. Cardiomegaly with moderate to marked aortic atherosclerosis with uncoiling of the thoracic aorta. No aneurysm. Atelectasis is identified at the left lung base. No pneumothorax or pulmonary contusions. Lumbar spinal fusion L3 through L5 with interbody blocks in place. Chronic L2 compression deformity with moderate 50% height loss. Inferior endplate compression of T8 with 50% height loss is also noted. These appear  chronic and stable. IMPRESSION: 1. Remote left sixth rib fracture. No acute displaced rib fracture is currently noted. 2. Cardiomegaly with aortic atherosclerosis. 3. Thoracolumbar spondylosis with lower lumbar spinal fusion and chronic compression fractures of T8 and L2. Electronically Signed   By: Ashley Royalty M.D.   On: 04/23/2018 00:21   Ct Head Wo Contrast  Result Date: 05/17/2018 CLINICAL DATA:  Hip injury altered mental status EXAM: CT HEAD WITHOUT CONTRAST TECHNIQUE: Contiguous axial images were obtained from the base of the skull through the vertex without intravenous contrast. COMPARISON:  04/24/2018 head CT, MRI 12/08/2011 FINDINGS: Brain: No acute territorial infarction, hemorrhage or intracranial mass. Chronic left occipital infarct. Moderate atrophy. Moderate to marked small vessel ischemic changes of the white matter. Stable ventricle size. Vascular: No hyperdense vessels. Carotid vascular calcification and vertebral artery calcification Skull: No fracture Sinuses/Orbits: No acute finding. Other: None IMPRESSION: 1. No CT evidence for acute intracranial abnormality. 2. Atrophy and small vessel ischemic changes of the white matter. Chronic left occipital infarct Electronically Signed   By: Donavan Foil M.D.   On: 05/17/2018 22:55   Ct Head Wo Contrast  Result Date: 04/24/2018 CLINICAL DATA:  Altered mental status, baseline dementia. Recent hip fracture with repair. EXAM: CT HEAD WITHOUT CONTRAST TECHNIQUE: Contiguous axial images  were obtained from the base of the skull through the vertex without intravenous contrast. COMPARISON:  CT head 04/01/2012.  MR head 12/08/2011. FINDINGS: The patient was unable to remain motionless for the exam. Small or subtle lesions could be overlooked. Brain: No evidence for acute infarction, hemorrhage, mass lesion, hydrocephalus, or extra-axial fluid. Advanced atrophy. Extensive chronic microvascular ischemic change. Chronic LEFT occipital infarct. Vascular:  Calcification of the cavernous internal carotid arteries consistent with cerebrovascular atherosclerotic disease. No signs of intracranial large vessel occlusion. Skull: No fracture or osseous lesion. Sinuses/Orbits: No significant opacity or layering fluid. Negative orbits. Other: LEFT carotid stent. IMPRESSION: Motion degraded exam demonstrating advanced atrophy and extensive small vessel disease. Old LEFT hemisphere infarct, evidence for previous LEFT carotid surgery. No acute intracranial findings. Electronically Signed   By: Staci Righter M.D.   On: 04/24/2018 13:24   Pelvis Portable  Result Date: 04/23/2018 CLINICAL DATA:  Anterior left hip hemiarthroplasty. EXAM: PORTABLE PELVIS 1-2 VIEWS COMPARISON:  04/22/2018 FINDINGS: Right hip arthroplasty unchanged. Interval placement of left hip arthroplasty intact and normally located. Mild diffuse osteopenia. Remainder of the exam is unchanged. IMPRESSION: Interval left hip arthroplasty intact. Electronically Signed   By: Marin Olp M.D.   On: 04/23/2018 16:27   US Abdomen Limited  Result Date: 05/03/2018 CLINICAL DATA:  Right and left lower quadrant abdominal wall masses, redness and swelling, question hematoma. Left hip arthroplasty 10 days ago. EXAM: ULTRASOUND ABDOMEN LIMITED COMPARISON:  None. FINDINGS: Targeted sonographic evaluation in the area of the right lower quadrant abdominal wall in the area of redness demonstrates subcutaneous edema without dominant focal fluid collection or mass. Targeted sonographic evaluation in the area of the left lower quadrant abdominal wall in the area of redness demonstrates subcutaneous edema without focal fluid collection or mass. Incidental note of urinary bladder distention with bladder volume of 1048.0 cm^3. IMPRESSION: 1. Edema in the right and left lower quadrant anterior abdominal wall without focal fluid collection or mass. No confluent hematoma. 2. Distended urinary bladder with bladder volume of 1048.0  cm^3. Electronically Signed   By: Jeb Levering M.D.   On: 05/03/2018 22:19   Dg Chest Port 1 View  Result Date: 05/19/2018 CLINICAL DATA:  Hypoxia EXAM: PORTABLE CHEST 1 VIEW COMPARISON:  May 18, 2018 FINDINGS: The right central line is stable. The cardiomediastinal silhouette is unchanged. Mild opacity in left base may represent atelectasis or mild infiltrate, unchanged. No other interval changes. IMPRESSION: 1. Mild persistent patchy opacity in left base may represent atelectasis or mild infiltrate. No other change. Electronically Signed   By: Dorise Bullion III M.D   On: 05/19/2018 07:07   Dg Chest Portable 1 View  Result Date: 05/18/2018 CLINICAL DATA:  Follow-up central line placement EXAM: PORTABLE CHEST 1 VIEW COMPARISON:  Film from earlier in the same day. FINDINGS: Right jugular central line is been withdrawn and now lies at the cavoatrial junction. No pneumothorax is seen. Mild patchy atelectatic changes are noted in the left base. No other focal abnormality is noted. IMPRESSION: Interval withdrawal of right jugular catheter. Mild left basilar atelectasis is seen. Electronically Signed   By: Inez Catalina M.D.   On: 05/18/2018 02:51   Dg Chest Portable 1 View  Result Date: 05/18/2018 CLINICAL DATA:  Central line placement. EXAM: PORTABLE CHEST 1 VIEW COMPARISON:  Chest radiograph May 17, 2018 FINDINGS: RIGHT internal jugular central venous catheter distal tip projects in proximal atrium. Stable cardiomegaly. Tortuous calcified aorta. Mild chronic interstitial changes without pleural effusion or  focal consolidation. Minimal LEFT lung base scarring. No pneumothorax. Osteopenia. IMPRESSION: 1. RIGHT internal jugular central venous catheter distal tip projects in proximal RIGHT atrium, recommend approximately 2 cm retraction. No pneumothorax. 2. Stable cardiomegaly. 3.  Aortic Atherosclerosis (ICD10-I70.0). Electronically Signed   By: Elon Alas M.D.   On: 05/18/2018 02:24   Dg  Chest Portable 1 View  Result Date: 05/17/2018 CLINICAL DATA:  Dyspnea EXAM: PORTABLE CHEST 1 VIEW COMPARISON:  05/07/2018 chest radiograph. FINDINGS: Stable cardiomediastinal silhouette with top-normal heart size. No pneumothorax. No pleural effusion. No pulmonary edema. Improved aeration at the lung bases, with mild residual scarring versus atelectasis at the left lung base. IMPRESSION: Improved aeration at the lung bases, with mild residual scarring versus atelectasis at the left lung base. No definite residual pleural effusions. Electronically Signed   By: Ilona Sorrel M.D.   On: 05/17/2018 19:58   Dg Chest Port 1 View  Result Date: 05/06/2018 CLINICAL DATA:  Shortness of breath. EXAM: PORTABLE CHEST 1 VIEW COMPARISON:  Chest radiograph May 05, 2018 FINDINGS: Stable cardiomegaly. Tortuous calcified aorta. Pulmonary vascular congestion and interstitial prominence. Retrocardiac consolidation. Small to moderate bilateral pleural effusions. No pneumothorax. RIGHT PICC in situ. Soft tissue planes and included osseous structures are unchanged. IMPRESSION: Stable cardiomegaly and pulmonary edema. Persistent retrocardiac consolidation with small to moderate pleural effusions. Increasingly elevated RIGHT hemidiaphragm compatible with underlying atelectasis. Aortic Atherosclerosis (ICD10-I70.0). Electronically Signed   By: Elon Alas M.D.   On: 05/06/2018 03:31   Dg Chest Port 1 View  Result Date: 05/05/2018 CLINICAL DATA:  Difficulty breathing EXAM: PORTABLE CHEST 1 VIEW COMPARISON:  05/03/2018, 04/30/2018, 04/28/2018 FINDINGS: Right upper extremity catheter tip overlies the SVC. Cardiomegaly with vascular congestion and mild pulmonary edema. Small pleural effusions and increasing bibasilar airspace disease. Aortic atherosclerosis. No pneumothorax. IMPRESSION: 1. Cardiomegaly with vascular congestion and mild edema 2. Small pleural effusions with increasing left greater than right bibasilar airspace  disease, atelectasis versus pneumonia Electronically Signed   By: Donavan Foil M.D.   On: 05/05/2018 19:44   Dg Chest Port 1 View  Result Date: 05/03/2018 CLINICAL DATA:  Shortness of breath. EXAM: PORTABLE CHEST 1 VIEW COMPARISON:  Radiograph of April 30, 2018. FINDINGS: Stable cardiomegaly. Atherosclerosis of thoracic aorta is noted. No pneumothorax is noted. Right-sided PICC line is unchanged in position. Mild bibasilar atelectasis is noted with minimal pleural effusions. Bony thorax is unremarkable. IMPRESSION: Mild bibasilar subsegmental atelectasis with minimal pleural effusions. Aortic Atherosclerosis (ICD10-I70.0). Electronically Signed   By: Marijo Conception, M.D.   On: 05/03/2018 16:16   Dg Chest Port 1 View  Result Date: 04/30/2018 CLINICAL DATA:  Short of breath wheezing EXAM: PORTABLE CHEST 1 VIEW COMPARISON:  04/28/2018, 04/22/2018 FINDINGS: Right upper extremity catheter tip overlies the SVC. Cardiomegaly with small pleural effusions. Vascular congestion. Atelectasis or infiltrate at the left base, increased compared to prior. IMPRESSION: 1. Right upper extremity catheter tip overlies the SVC 2. Cardiomegaly with central vascular congestion. 3. Small pleural effusions. Airspace disease at the left base may reflect atelectasis or a pneumonia. Electronically Signed   By: Donavan Foil M.D.   On: 04/30/2018 21:39   Dg Chest Port 1 View  Result Date: 04/28/2018 CLINICAL DATA:  82 year old female code sepsis. EXAM: PORTABLE CHEST 1 VIEW COMPARISON:  Chest radiographs 04/22/2018 and earlier. FINDINGS: Portable AP semi upright view at 0946 hours. Stable cardiomegaly and tortuosity of the thoracic aorta with calcified atherosclerosis. Stable to mildly lower lung volumes. Allowing for portable technique the lungs  are clear. No pneumothorax. Paucity bowel gas in the upper abdomen. IMPRESSION: 1. No acute cardiopulmonary abnormality. 2. Chronic cardiomegaly and Aortic Atherosclerosis (ICD10-I70.0).  Electronically Signed   By: Genevie Ann M.D.   On: 04/28/2018 10:10   Dg Abd Portable 1v  Result Date: 05/06/2018 CLINICAL DATA:  Abdominal pain and distension. EXAM: PORTABLE ABDOMEN - 1 VIEW COMPARISON:  Pelvic radiograph May 03, 2018 FINDINGS: Bowel gas pattern is nondilated and nonobstructive. Ovoid densities RIGHT abdomen compatible with pills. Vascular calcifications. No intra-abdominal mass effect. Lumbar spine hardware. Bilateral hip hemi arthroplasties. IMPRESSION: 1. Non-specific bowel gas pattern. Electronically Signed   By: Elon Alas M.D.   On: 05/06/2018 03:33   Dg Swallowing Func-speech Pathology  Result Date: 05/20/2018 Objective Swallowing Evaluation: Type of Study: Bedside Swallow Evaluation  Patient Details Name: SHAKEA ISIP MRN: [id number] Date of Birth: May 15, 1936 Today's Date: 05/20/2018 Time: SLP Start Time (ACUTE ONLY): 0911 -SLP Stop Time (ACUTE ONLY): 0940 SLP Time Calculation (min) (ACUTE ONLY): 29 min Past Medical History: Past Medical History: Diagnosis Date . AAA (abdominal aortic aneurysm) (HCC)   S/p aortic repair and endarterectomy 12/2011  . Anemia   hx . Arthritis  . Blood transfusion   back in 1973 . C. difficile diarrhea  . Carotid stenosis, left   S/p left CEA 09/2008 . Diabetes mellitus   type 11 . Endocrine disturbance   gland has not  worked until 1970.Marland Kitchen..dx in 1977 . GERD (gastroesophageal reflux disease)   on meds to help....meds do well  . Heart murmur  . Hypertension  . Hypothyroidism  . Pneumonia   hx . PVD (peripheral vascular disease) ([place])  . Sheehan syndrome ([place]) 1977 . Stroke [facility])   Ischemic x 2       last one 2007 Past Surgical History: Past Surgical History: Procedure Laterality Date . ABDOMINAL AORTIC ANEURYSM REPAIR  12/09/2011  Procedure: ANEURYSM ABDOMINAL AORTIC REPAIR;  Surgeon: Mal Misty, MD;  Location: [facility];  Service: Vascular;  Laterality: N/A;  resection and grafting abdominal aortic aneurysm; Insertion Bilateral External Iliac Graft;  Reexploration Proximal Anastemosis; Aortic Endarterectomy; Reimplantation Inferior Mesenteric Artery; Intraaortic Graft . [place] . APPENDECTOMY    1960 . BACK SURGERY    1990's . [place]  &  08/2008 . CAROTID ENDARTERECTOMY Left 09/2008  Archie Endo 02/03/2011 . CESAREAN SECTION   . EYE SURGERY Right May 2016  Pt. fell . FOOT HARDWARE REMOVAL Left 11/12/2005  Archie Endo 02/17/2011 . FRACTURE SURGERY    left ankle  . HEMIARTHROPLASTY HIP Right 01/2007  Archie Endo 02/17/2011 . JOINT REPLACEMENT   . LUMBAR LAMINECTOMY  10/18/2012  Archie Endo 10/18/2012 . MENISCUS REPAIR Right 11/12/2005  Partial medial and lateral Archie Endo 02/17/2011 . ORIF ANKLE FRACTURE Left 01/2010  Archie Endo 01/22/2010 . TOE FUSION Left 02/2000  Fusion and alignment of the first metatarsal medial cuneiform joint./notes 05/02/2018 . TOTAL HIP ARTHROPLASTY Left 04/23/2018  Procedure: ANTERIOR LEFT HIP HEMIARTHROPALSTY;  Surgeon: Leandrew Koyanagi, MD;  Location: [facility];  Service: Orthopedics;  Laterality: Left; . TOTAL KNEE ARTHROPLASTY Right 02/2008  Archie Endo 02/05/2011 HPI: 82 yo female adm to [facility] with sepsis.  Pt with PMH + for dementia, resident of [facility], has GERD, occipital lobe CVA.  Swallow evaluation ordered.  CXR 05/19/18 showed mild atelectasis at Left base - ? atx vs infiltrate.  Per daughter, pt with decline in function since her last hospital admission.   Subjective: pt awake in bed, daughter [name] at bedside,  spouse is HCPOA - spoke to him via speaker phone Assessment / Plan / Recommendation CH[facility] IP CLINICAL IMPRESSIONS 05/20/2018 Clinical Impression Patient presents with several oral and moderate pharyngeal dysphagia likely due to dementia/cva hx.  Poor attention to tasks noted during testing resulting in pt orally holding boluses.   Premature spillage of boluses into pharynx poorly controlled noted.  Pharyngeal swallow delayed - extensive intermittently noted- despite dry spoon stimulation to aid motor  planning/initiation.  Boluses spilled to pyriform sinus prior to pt swallowing.   Aspiration of thin via tsp noted before the swallow.  Pharyngeal swallow is strong without residuals fortunately.  However with pt's delay in swallow and weak cough, her aspiration and aspiration pna risk is high. Given pt has dementia and family does not want feeding tube - consideration for comfort diet appears appropriate.  Would recommend dys2/nectar - allowing thin water between meals and consider advancing as tolerated if pt care plan becomes comfort only.  Extensive education completed with daughter [name] in [place].  Debra willing to meet with palliative team when SLP advised consideration.  SLP passed information onto MD.  Will follow up for family education/po tolerance, etc.  Thanks for this consult.  SLP Visit Diagnosis Dysphagia, oropharyngeal phase (R13.12) Attention and concentration deficit following -- Frontal lobe and executive function deficit following -- Impact on safety and function Severe aspiration risk;Risk for inadequate nutrition/hydration   CHL IP TREATMENT RECOMMENDATION 05/20/2018 Treatment Recommendations Therapy as outlined in treatment plan below   Prognosis 05/20/2018 Prognosis for Safe Diet Advancement Guarded Barriers to Reach Goals Severity of deficits;Time post onset;Cognitive deficits;Other (Comment) Barriers/Prognosis Comment -- CHL IP DIET RECOMMENDATION 05/20/2018 SLP Diet Recommendations Dysphagia 2 (Fine chop) solids;Nectar thick liquid;Free water protocol after oral care Liquid Administration via Cup;Straw Medication Administration Crushed with puree Compensations Slow rate;Small sips/bites Postural Changes Seated upright at 90 degrees   CHL IP OTHER RECOMMENDATIONS 05/20/2018 Recommended Consults -- Oral Care Recommendations Oral care before and after PO Other Recommendations Order thickener from pharmacy;Have oral suction available   No flowsheet data found.  CHL IP FREQUENCY AND DURATION  05/20/2018 Speech Therapy Frequency (ACUTE ONLY) min 1 x/week Treatment Duration 1 week      CHL IP ORAL PHASE 05/20/2018 Oral Phase Impaired Oral - Pudding Teaspoon -- Oral - Pudding Cup -- Oral - Honey Teaspoon -- Oral - Honey Cup -- Oral - Nectar Teaspoon Weak lingual manipulation;Reduced posterior propulsion;Holding of bolus;Delayed oral transit;Decreased bolus cohesion;Premature spillage Oral - Nectar Cup Decreased bolus cohesion;Delayed oral transit;Weak lingual manipulation;Premature spillage;Holding of bolus;Reduced posterior propulsion;Lingual/palatal residue Oral - Nectar Straw Reduced posterior propulsion;Weak lingual manipulation;Delayed oral transit;Decreased bolus cohesion;Premature spillage;Holding of bolus Oral - Thin Teaspoon Delayed oral transit;Decreased bolus cohesion;Premature spillage;Weak lingual manipulation;Reduced posterior propulsion;Holding of bolus Oral - Thin Cup -- Oral - Thin Straw Decreased bolus cohesion;Premature spillage;Delayed oral transit Oral - Puree Premature spillage;Weak lingual manipulation;Holding of bolus;Reduced posterior propulsion;Delayed oral transit;Decreased bolus cohesion Oral - Mech Soft Impaired mastication;Weak lingual manipulation;Reduced posterior propulsion;Premature spillage;Delayed oral transit;Decreased bolus cohesion Oral - Regular -- Oral - Multi-Consistency -- Oral - Pill -- Oral Phase - Comment dry spoon placed on pt's tongue to aid in motor planning/swallow trigger was not effective, pt with very poor attention and would look to the right without manipulating bolus in mouth despite cues, often barium spills into pharynx poorly controlled  CHL IP PHARYNGEAL PHASE 05/20/2018 Pharyngeal Phase Impaired Pharyngeal- Pudding Teaspoon -- Pharyngeal -- Pharyngeal- Pudding Cup -- Pharyngeal -- Pharyngeal- Honey Teaspoon -- Pharyngeal -- Pharyngeal-  Honey Cup -- Pharyngeal -- Pharyngeal- Nectar Teaspoon Delayed swallow initiation-pyriform sinuses Pharyngeal --  Pharyngeal- Nectar Cup Delayed swallow initiation-pyriform sinuses Pharyngeal -- Pharyngeal- Nectar Straw Delayed swallow initiation-pyriform sinuses Pharyngeal -- Pharyngeal- Thin Teaspoon Delayed swallow initiation-pyriform sinuses;Penetration/Aspiration before swallow Pharyngeal Material enters airway, passes BELOW cords and not ejected out despite cough attempt by patient Pharyngeal- Thin Cup -- Pharyngeal -- Pharyngeal- Thin Straw Delayed swallow initiation-pyriform sinuses Pharyngeal -- Pharyngeal- Puree Delayed swallow initiation-vallecula Pharyngeal -- Pharyngeal- Mechanical Soft Delayed swallow initiation-pyriform sinuses Pharyngeal -- Pharyngeal- Regular -- Pharyngeal -- Pharyngeal- Multi-consistency -- Pharyngeal -- Pharyngeal- Pill -- Pharyngeal -- Pharyngeal Comment delay in swallow reflex is extensive, to the point where xray had to be turned off to prevent over exposure to xray for pt, pt with aspiration before the swallow due to accumulation of barium at pyriform sinus without swallow initiation  CHL IP CERVICAL ESOPHAGEAL PHASE 05/20/2018 Cervical Esophageal Phase WFL Pudding Teaspoon -- Pudding Cup -- Honey Teaspoon -- Honey Cup -- Nectar Teaspoon -- Nectar Cup -- Nectar Straw -- Thin Teaspoon -- Thin Cup -- Thin Straw -- Puree -- Mechanical Soft -- Regular -- Multi-consistency -- Pill -- Cervical Esophageal Comment -- Macario Golds 05/20/2018, 10:19 AM      Luanna Salk, MS [facility] SLP [phone]         Dg C-arm 1-60 Min  Result Date: 04/23/2018 CLINICAL DATA:  Left hip arthroplasty. EXAM: OPERATIVE left HIP (WITH PELVIS IF PERFORMED)  VIEWS TECHNIQUE: Fluoroscopic spot image(s) were submitted for interpretation post-operatively. COMPARISON:  04/22/2018 FINDINGS: There has been interval placement of a left hip arthroplasty in adequate position normally located. Partially visualized right hip arthroplasty is present. Remainder the exam is unchanged. IMPRESSION: Placement of left hip  arthroplasty intact. Electronically Signed   By: Marin Olp M.D.   On: 04/23/2018 15:01   Dg Hip Operative Unilat W Or W/o Pelvis Left  Result Date: 04/23/2018 CLINICAL DATA:  Left hip arthroplasty. EXAM: OPERATIVE left HIP (WITH PELVIS IF PERFORMED)  VIEWS TECHNIQUE: Fluoroscopic spot image(s) were submitted for interpretation post-operatively. COMPARISON:  04/22/2018 FINDINGS: There has been interval placement of a left hip arthroplasty in adequate position normally located. Partially visualized right hip arthroplasty is present. Remainder the exam is unchanged. IMPRESSION: Placement of left hip arthroplasty intact. Electronically Signed   By: Marin Olp M.D.   On: 04/23/2018 15:01   Dg Hip Unilat W Or Wo Pelvis 2-3 Views Left  Result Date: 04/28/2018 CLINICAL DATA:  82 year old female code sepsis. Left hip arthroplasty post op day five. EXAM: DG HIP (WITH OR WITHOUT PELVIS) 2-3V LEFT COMPARISON:  Intraoperative images 72019, left hip series 04/22/2018. FINDINGS: Portable AP supine view at 0950 hours. Interval bipolar left hip arthroplasty. Chronic right proximal femoral arthroplasty. The visible hardware appears intact. The left hip hardware appears normally aligned. Previous lumbar spine fusion. No newosseous abnormality identified. Femoral artery calcified atherosclerosis. Negative visible bowel gas pattern. IMPRESSION: 1. Left hip arthroplasty with no adverse features. No acute osseous abnormality identified. 2. Visible bowel gas pattern is normal. Electronically Signed   By: Genevie Ann M.D.   On: 04/28/2018 10:16   Dg Hip Unilat W Or Wo Pelvis 2-3 Views Left  Result Date: 04/23/2018 CLINICAL DATA:  Left hip pain after fall. EXAM: DG HIP (WITH OR WITHOUT PELVIS) 2-3V LEFT COMPARISON:  None. FINDINGS: An acute, varus angulated and slightly impacted left transcervical femoral neck fracture is identified. Lower lumbar spinal fusion hardware appears intact. The bony pelvis and pubic rami  are intact.  No acetabular fracture is noted. Partially included uncemented right bipolar hip arthroplasty is in place without complicating features. Dense bi-iliofemoral atherosclerosis. IMPRESSION: Acute varus angulated transcervical fracture of the left femur with mild impaction of the femoral neck upon the opposing femoral neck. Electronically Signed   By: Ashley Royalty M.D.   On: 04/23/2018 00:26   [place] Ekg Site Rite  Result Date: 04/29/2018 If Site Rite image not attached, placement could not be confirmed due to current cardiac rhythm.  [place] Ekg Site Rite  Result Date: 04/29/2018 If Site Rite image not attached, placement could not be confirmed due to current cardiac rhythm.   Microbiology: Recent Results (from the past 240 hour(s))  Culture, blood (Routine x 2)     Status: None (Preliminary result)   Collection Time: 05/17/18  7:35 PM  Result Value Ref Range Status   Specimen Description BLOOD LEFT HAND  Final   Special Requests   Final    BOTTLES DRAWN AEROBIC ONLY Blood Culture adequate volume   Culture   Final    NO GROWTH 4 DAYS Performed at [facility], [address], [place], [place] [address]    Report Status PENDING  Incomplete  Culture, blood (Routine x 2)     Status: None (Preliminary result)   Collection Time: 05/17/18  8:25 PM  Result Value Ref Range Status   Specimen Description BLOOD RIGHT ARM  Final   Special Requests   Final    BOTTLES DRAWN AEROBIC AND ANAEROBIC Blood Culture adequate volume   Culture   Final    NO GROWTH 4 DAYS Performed at [facility], [address], [place], [place] [address]    Report Status PENDING  Incomplete  Culture, Urine     Status: Abnormal   Collection Time: 05/18/18  4:45 AM  Result Value Ref Range Status   Specimen Description URINE, CATHETERIZED  Final   Special Requests   Final    NONE Performed at [facility], [place] [address], [place], [place] [address]    Culture >=100,000 COLONIES/mL ENTEROBACTER SPECIES (A)   Final   Report Status 05/20/2018 FINAL  Final   Organism ID, Bacteria ENTEROBACTER SPECIES (A)  Final      Susceptibility   Enterobacter species - MIC*    CEFAZOLIN >=64 RESISTANT Resistant     CEFTRIAXONE 16 INTERMEDIATE Intermediate     CIPROFLOXACIN <=0.25 SENSITIVE Sensitive     GENTAMICIN <=1 SENSITIVE Sensitive     IMIPENEM <=0.25 SENSITIVE Sensitive     NITROFURANTOIN 64 INTERMEDIATE Intermediate     TRIMETH/SULFA <=20 SENSITIVE Sensitive     PIP/TAZO >=128 RESISTANT Resistant     * >=100,000 COLONIES/mL ENTEROBACTER SPECIES  MRSA PCR Screening     Status: None   Collection Time: 05/18/18  4:45 AM  Result Value Ref Range Status   MRSA by PCR NEGATIVE NEGATIVE Final    Comment:        The GeneXpert MRSA Assay (FDA approved for NASAL specimens only), is one component of a comprehensive MRSA colonization surveillance program. It is not intended to diagnose MRSA infection nor to guide or monitor treatment for MRSA infections. Performed at [facility], [place] [address], [place], [place] [address]      Labs: Basic Metabolic Panel: Recent Labs  Lab 05/18/18 0446 05/18/18 1837 05/18/18 2352 05/19/18 0521 05/20/18 0500 05/21/18 0551 05/22/18 0315  NA 147* 145 145  --  142 144 145  K 5.2* 4.3 4.0  --  3.2* 2.7* 2.2*  CL 112* 111 111  --  109 108 108  CO2 23 22 23   --  23 23 26   GLUCOSE 175* 158* 151*  --  134* 161* 89  BUN 55* 53* 50*  --  43* 30* 26*  CREATININE 5.88* 5.52* 5.30*  --  3.92* 2.85* 2.40*  CALCIUM 7.7* 7.4* 7.2*  --  7.7* 8.5* 8.3*  MG 1.9  --   --  1.7 2.2  --  1.8  PHOS 5.0* 5.0*  --  5.1* 4.2 3.2 2.6   Liver Function Tests: Recent Labs  Lab 05/17/18 1938 05/18/18 0446 05/18/18 1837 05/19/18 0521 05/21/18 0551 05/22/18 0315  AST 22  --   --  28  --  54*  ALT 12  --   --  11  --  31  ALKPHOS 137*  --   --  85  --  89  BILITOT 0.7  --   --  0.8  --  0.9  PROT 6.5  --   --  5.2*  --  5.3*  ALBUMIN 2.9* 2.5* 2.4* 2.4* 2.5* 2.5*   No  results for input(s): LIPASE, AMYLASE in the last 168 hours. No results for input(s): AMMONIA in the last 168 hours. CBC: Recent Labs  Lab 05/17/18 1938 05/18/18 0038 05/18/18 0446 05/19/18 0521 05/20/18 0500 05/21/18 0551  WBC 16.9*  --  13.6* 11.0* 7.0 5.9  NEUTROABS 11.3*  --   --  9.9* 5.6 4.2  HGB 12.6 11.6* 10.5* 8.2* 8.2* 9.0*  HCT 44.8 34.0* 36.1 27.7* 26.9* 29.4*  MCV 107.7*  --  105.2* 103.4* 99.3 99.0  PLT 142*  --  121* 105* 120* 156   Cardiac Enzymes: Recent Labs  Lab 05/18/18 0620 05/19/18 0521  TROPONINI 0.07* 0.06*   BNP: BNP (last 3 results) No results for input(s): BNP in the last 8760 hours.  ProBNP (last 3 results) No results for input(s): PROBNP in the last 8760 hours.  CBG: Recent Labs  Lab 05/21/18 1625 05/21/18 1936 05/21/18 2321 05/22/18 0323 05/22/18 0818  GLUCAP 128* 90 84 83 88       Signed:  Nita Sells MD   [facility] 05/22/2018, 11:14 AM

## 2018-05-22 NOTE — Progress Notes (Signed)
Daily Progress Note  Met with patient, chart reviewed.  Husband is at the bedside.  [facility] liaison joined [place].  They do have a bed available today and she has been formally accepted into their program.  Husband is in agreement  I did discuss partial CODE STATUS with husband and patient is now a full DNR.  [facility] Gemma form on patient's chart for transport.   MAR reviewed now that she is full comfort care.  Will discontinue runs of K, oral medications that she is struggling to take, DC heparin.  Family is aware that anticoagulation, IV fluids, IV antibiotics, electrolyte replacement, is not offered at [facility] or part of comfort care focus.  Husband verbalized understanding  Notified Dr. Verlon Au for discharge summary.  No need for prescriptions.  Social work contacted about disposition plan to arrange transportation to [facility]  Thank you, Halana Deisher, NP  Time end: 1030 Time out: 1105 Total time: 35 minutes

## 2018-05-22 NOTE — Clinical Social Work Note (Signed)
CSW spoke with Katie Pace with [facility]. Pt's room will be ready this afternoon. Discharge summary was faxed to [facility], fax number confirmed. CSW will wait until pt's room is ready at facility and CSW will set up transport.   [name], MSW [phone]

## 2018-05-22 NOTE — Clinical Social Work Note (Signed)
Per RN [facility] will take pt today.   [name], MSW [phone]

## 2018-05-22 NOTE — Progress Notes (Signed)
Patient discharging to [facility]. All belongs sent with family. Nurse will call report.

## 2018-05-24 ENCOUNTER — Inpatient Hospital Stay (INDEPENDENT_AMBULATORY_CARE_PROVIDER_SITE_OTHER): Payer: Medicare Other | Admitting: Orthopaedic Surgery

## 2018-06-02 DIAGNOSIS — Z7189 Other specified counseling: Secondary | ICD-10-CM

## 2018-06-05 DEATH — deceased

## 2020-02-20 IMAGING — CT CT HEAD W/O CM
4 series · 16 of 47 positions shown, 18 images · non-contrast
Comparison: CT head 04/01/2012.  MR head 12/08/2011.

CLINICAL DATA: Altered mental status, baseline dementia. Recent hip
fracture with repair.

EXAM:
CT HEAD WITHOUT CONTRAST
TECHNIQUE: Contiguous axial images were obtained from the base of the skull
through the vertex without intravenous contrast.

[Series 3: head wo · axial · 0.42mm/px · z∈[-146,-20]mm · 7 of 35 slices shown, 9 images]
[im 5/35  brain]
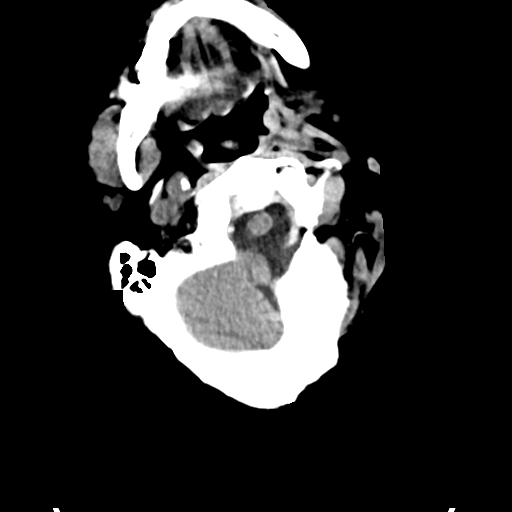
[im 5/35  bone]
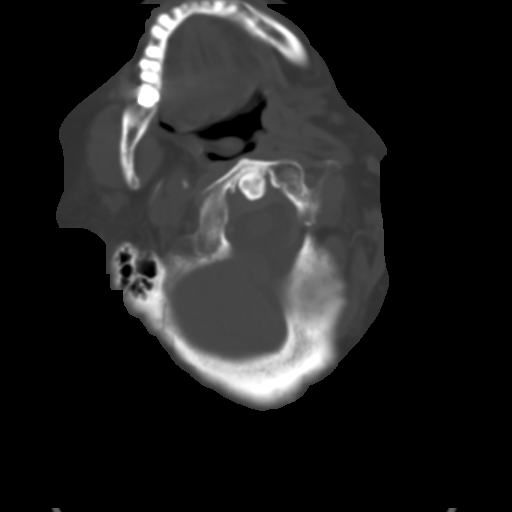
[im 9/35  brain]
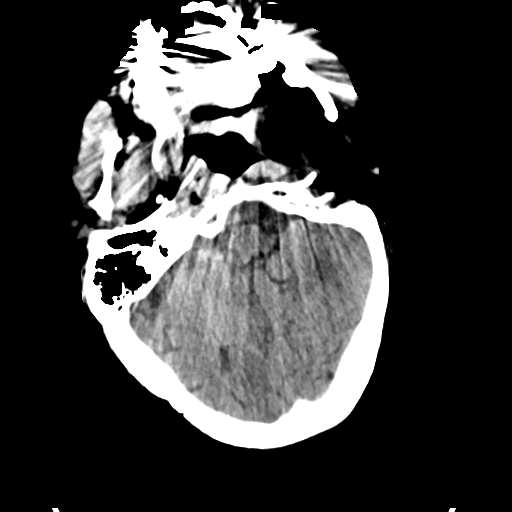
[im 13/35  brain]
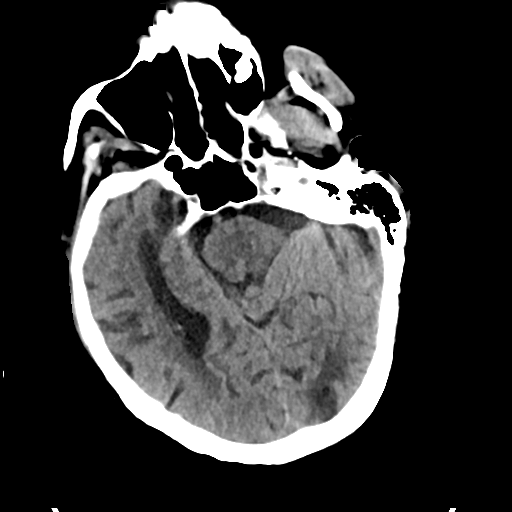
[im 18/35  brain]
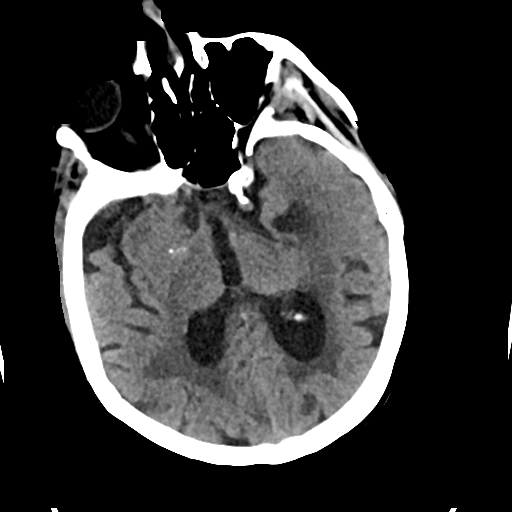
[im 22/35  brain]
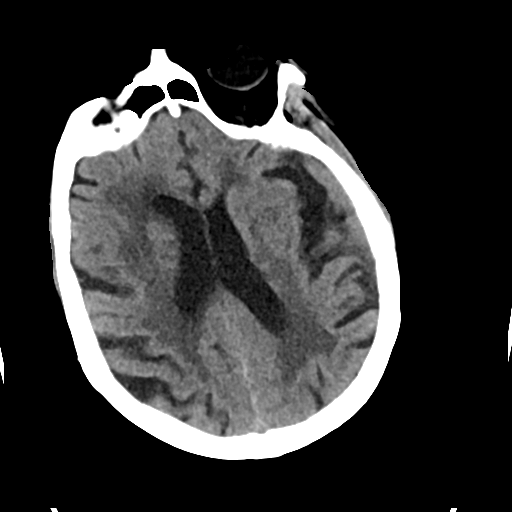
[im 22/35  bone]
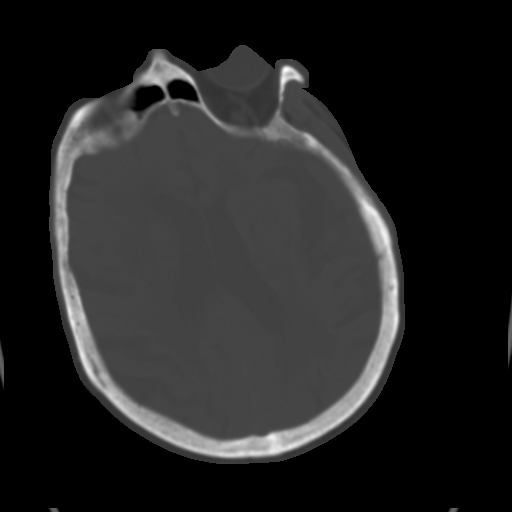
[im 26/35  brain]
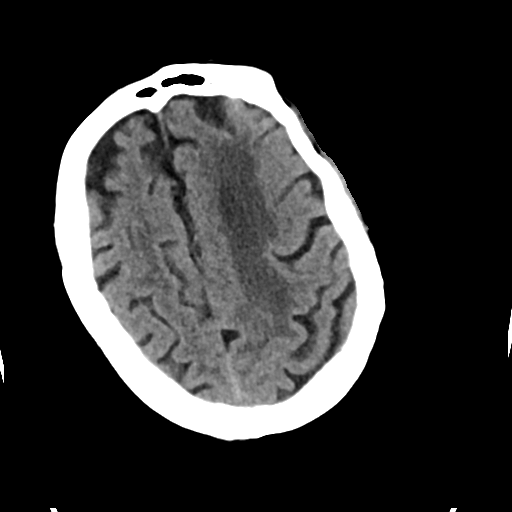
[im 30/35  brain]
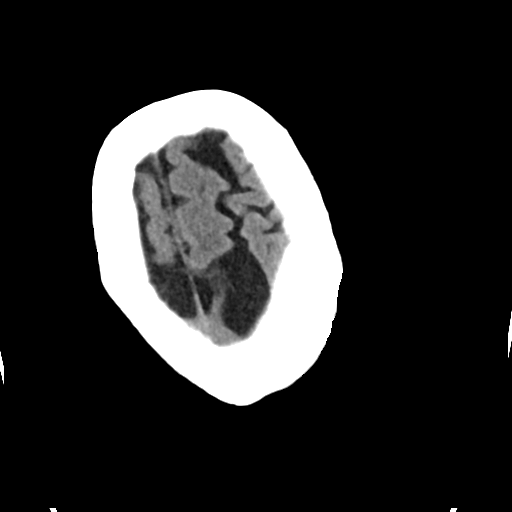

[Series 4: head bone · axial · 0.42mm/px · z∈[-150,-116]mm · 3 of 87 slices shown]
[im 9/87  bone]
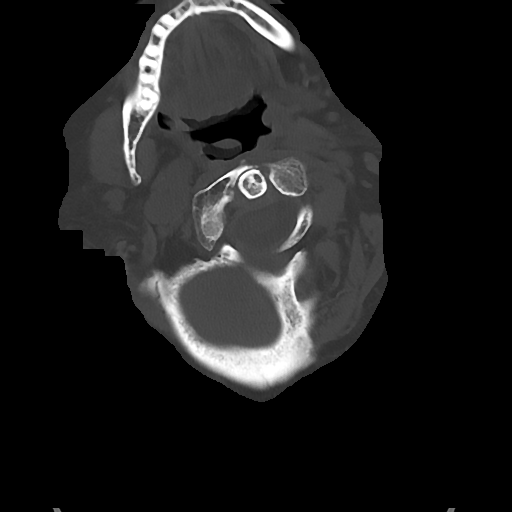
[im 18/87  bone]
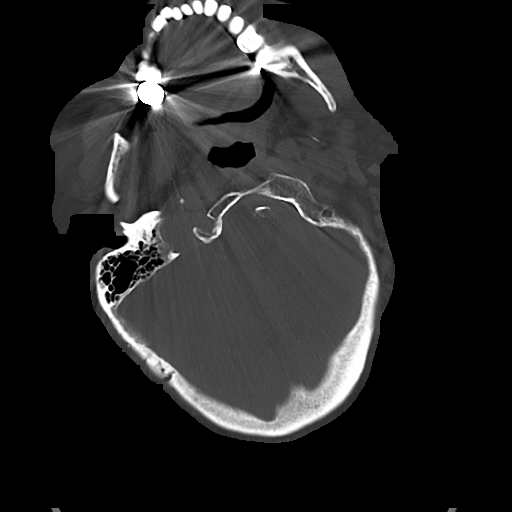
[im 26/87  bone]
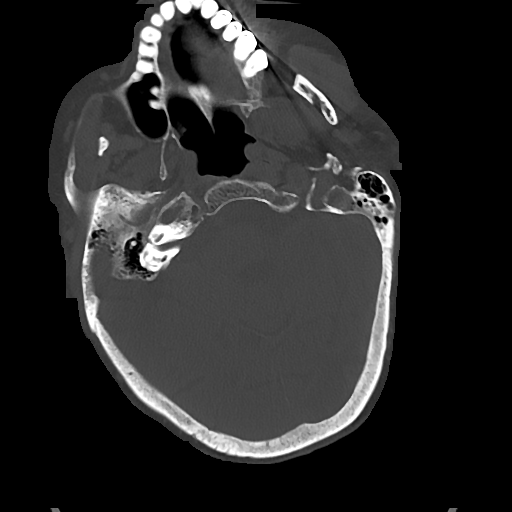

[Series 5: cor soft · coronal · 0.37mm/px · 3 of 63 slices shown]
[im 21/63  brain]
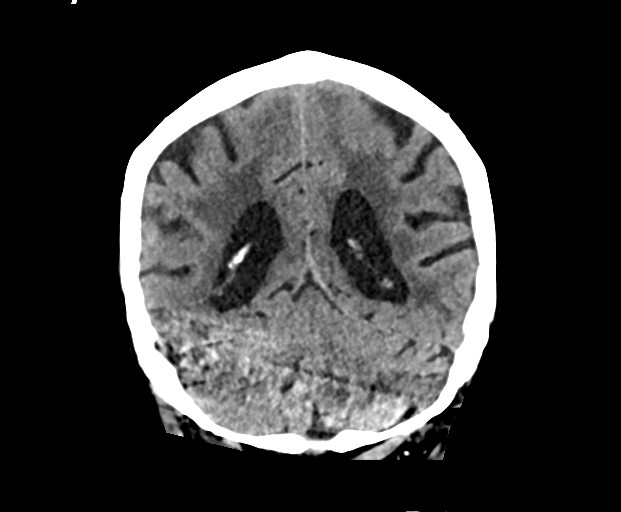
[im 28/63  brain]
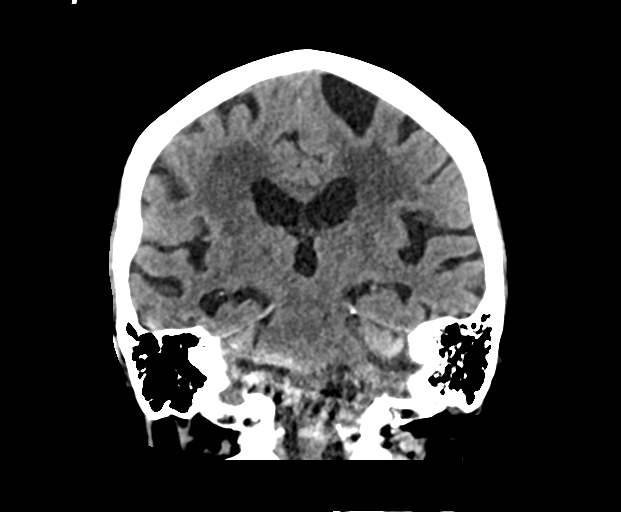
[im 35/63  brain]
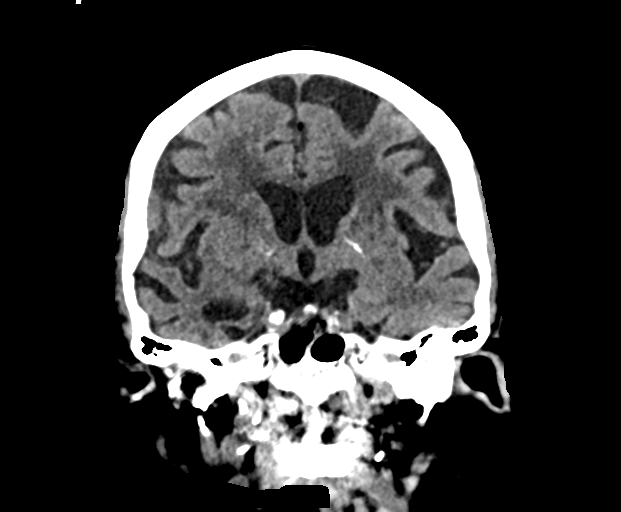

[Series 6: sag soft · sagittal · 0.36mm/px · 3 of 50 slices shown]
[im 21/50  brain]
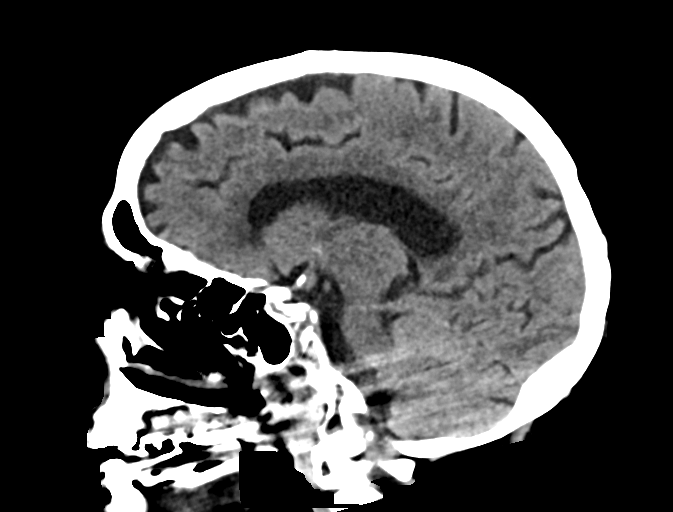
[im 25/50  brain]
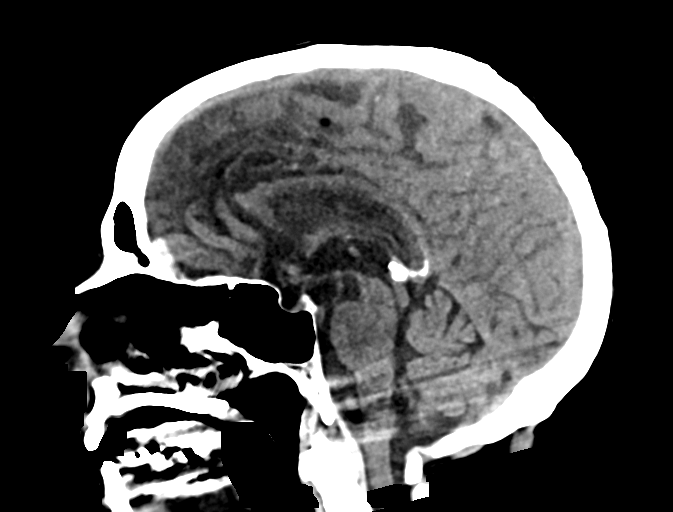
[im 29/50  brain]
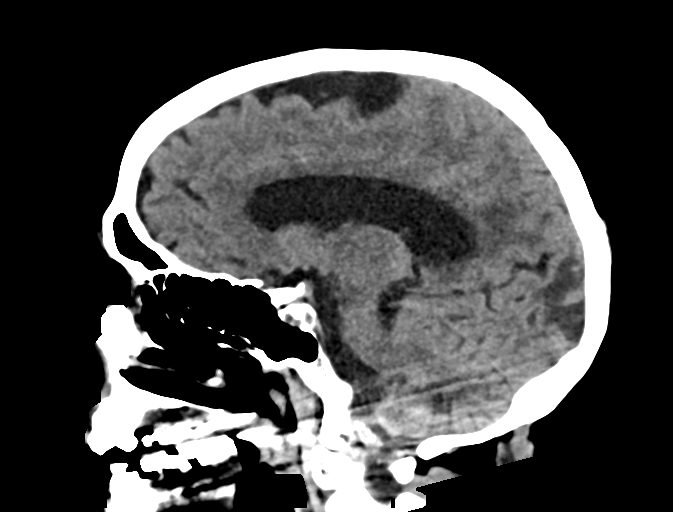

[16 of 47 positions shown; findings below may reference images not displayed]

FINDINGS: The patient was unable to remain motionless for the exam. Small or
subtle lesions could be overlooked.

Brain: No evidence for acute infarction, hemorrhage, mass lesion,
hydrocephalus, or extra-axial fluid. Advanced atrophy. Extensive
chronic microvascular ischemic change. Chronic LEFT occipital
infarct.

Vascular: Calcification of the cavernous internal carotid arteries
consistent with cerebrovascular atherosclerotic disease. No signs of
intracranial large vessel occlusion.

Skull: No fracture or osseous lesion.

Sinuses/Orbits: No significant opacity or layering fluid. Negative
orbits.

Other: LEFT carotid stent.
IMPRESSION: Motion degraded exam demonstrating advanced atrophy and extensive
small vessel disease.

Old LEFT hemisphere infarct, evidence for previous LEFT carotid
surgery.

No acute intracranial findings.
